# Patient Record
Sex: Male | Born: 1970 | Hispanic: Yes | Smoking: Current every day smoker
Health system: Southern US, Community
[De-identification: ages and names within clinical notes are randomized; demographics above are authoritative.]

## PROBLEM LIST (undated history)

## (undated) DIAGNOSIS — Z789 Other specified health status: Secondary | ICD-10-CM

## (undated) DIAGNOSIS — Z7289 Other problems related to lifestyle: Secondary | ICD-10-CM

## (undated) DIAGNOSIS — F1010 Alcohol abuse, uncomplicated: Secondary | ICD-10-CM

## (undated) DIAGNOSIS — F1090 Alcohol use, unspecified, uncomplicated: Secondary | ICD-10-CM

## (undated) HISTORY — DX: Alcohol abuse, uncomplicated: F10.10

---

## 2015-08-14 ENCOUNTER — Emergency Department (HOSPITAL_COMMUNITY)
Admission: EM | Admit: 2015-08-14 | Discharge: 2015-08-14 | Disposition: A | Discharge status: Home / Self Care | Payer: Self-pay | Attending: Emergency Medicine | Admitting: Emergency Medicine

## 2015-08-14 ENCOUNTER — Emergency Department (HOSPITAL_COMMUNITY): Payer: Self-pay

## 2015-08-14 ENCOUNTER — Encounter (HOSPITAL_COMMUNITY): Payer: Self-pay | Admitting: *Deleted

## 2015-08-14 DIAGNOSIS — M25511 Pain in right shoulder: Secondary | ICD-10-CM | POA: Insufficient documentation

## 2015-08-14 DIAGNOSIS — R112 Nausea with vomiting, unspecified: Secondary | ICD-10-CM | POA: Insufficient documentation

## 2015-08-14 DIAGNOSIS — M25512 Pain in left shoulder: Secondary | ICD-10-CM | POA: Insufficient documentation

## 2015-08-14 DIAGNOSIS — R109 Unspecified abdominal pain: Secondary | ICD-10-CM | POA: Insufficient documentation

## 2015-08-14 HISTORY — DX: Other specified health status: Z78.9

## 2015-08-14 HISTORY — DX: Alcohol use, unspecified, uncomplicated: F10.90

## 2015-08-14 HISTORY — DX: Other problems related to lifestyle: Z72.89

## 2015-08-14 LAB — URINALYSIS, ROUTINE W REFLEX MICROSCOPIC
BILIRUBIN URINE: NEGATIVE
GLUCOSE, UA: NEGATIVE mg/dL
HGB URINE DIPSTICK: NEGATIVE
Leukocytes, UA: NEGATIVE
Nitrite: NEGATIVE
PH: 5.5 (ref 5.0–8.0)
Protein, ur: NEGATIVE mg/dL
SPECIFIC GRAVITY, URINE: 1.025 (ref 1.005–1.030)

## 2015-08-14 LAB — COMPREHENSIVE METABOLIC PANEL
ALK PHOS: 89 U/L (ref 38–126)
ALT: 54 U/L (ref 17–63)
AST: 84 U/L — AB (ref 15–41)
Albumin: 4.5 g/dL (ref 3.5–5.0)
Anion gap: 9 (ref 5–15)
BUN: 6 mg/dL (ref 6–20)
CALCIUM: 8.8 mg/dL — AB (ref 8.9–10.3)
CO2: 23 mmol/L (ref 22–32)
CREATININE: 0.5 mg/dL — AB (ref 0.61–1.24)
Chloride: 103 mmol/L (ref 101–111)
Glucose, Bld: 99 mg/dL (ref 65–99)
Potassium: 3.5 mmol/L (ref 3.5–5.1)
Sodium: 135 mmol/L (ref 135–145)
Total Bilirubin: 0.8 mg/dL (ref 0.3–1.2)
Total Protein: 8.5 g/dL — ABNORMAL HIGH (ref 6.5–8.1)

## 2015-08-14 LAB — CBC
HCT: 40 % (ref 39.0–52.0)
HEMOGLOBIN: 13.7 g/dL (ref 13.0–17.0)
MCH: 31 pg (ref 26.0–34.0)
MCHC: 34.3 g/dL (ref 30.0–36.0)
MCV: 90.5 fL (ref 78.0–100.0)
Platelets: 145 10*3/uL — ABNORMAL LOW (ref 150–400)
RBC: 4.42 MIL/uL (ref 4.22–5.81)
RDW: 13.3 % (ref 11.5–15.5)
WBC: 5.9 10*3/uL (ref 4.0–10.5)

## 2015-08-14 LAB — TROPONIN I: Troponin I: <0.03 ng/mL (ref <0.03)

## 2015-08-14 LAB — POC OCCULT BLOOD, ED: Fecal Occult Bld: NEGATIVE

## 2015-08-14 LAB — LIPASE, BLOOD: Lipase: 40 U/L (ref 11–51)

## 2015-08-14 IMAGING — DX DG ABDOMEN ACUTE W/ 1V CHEST
4 series · 4 of 4 positions shown · non-contrast
Comparison: None.

CLINICAL DATA: 45-year-old male with nausea vomiting abdominal pain

EXAM:
DG ABDOMEN ACUTE W/ 1V CHEST

[chest pa]
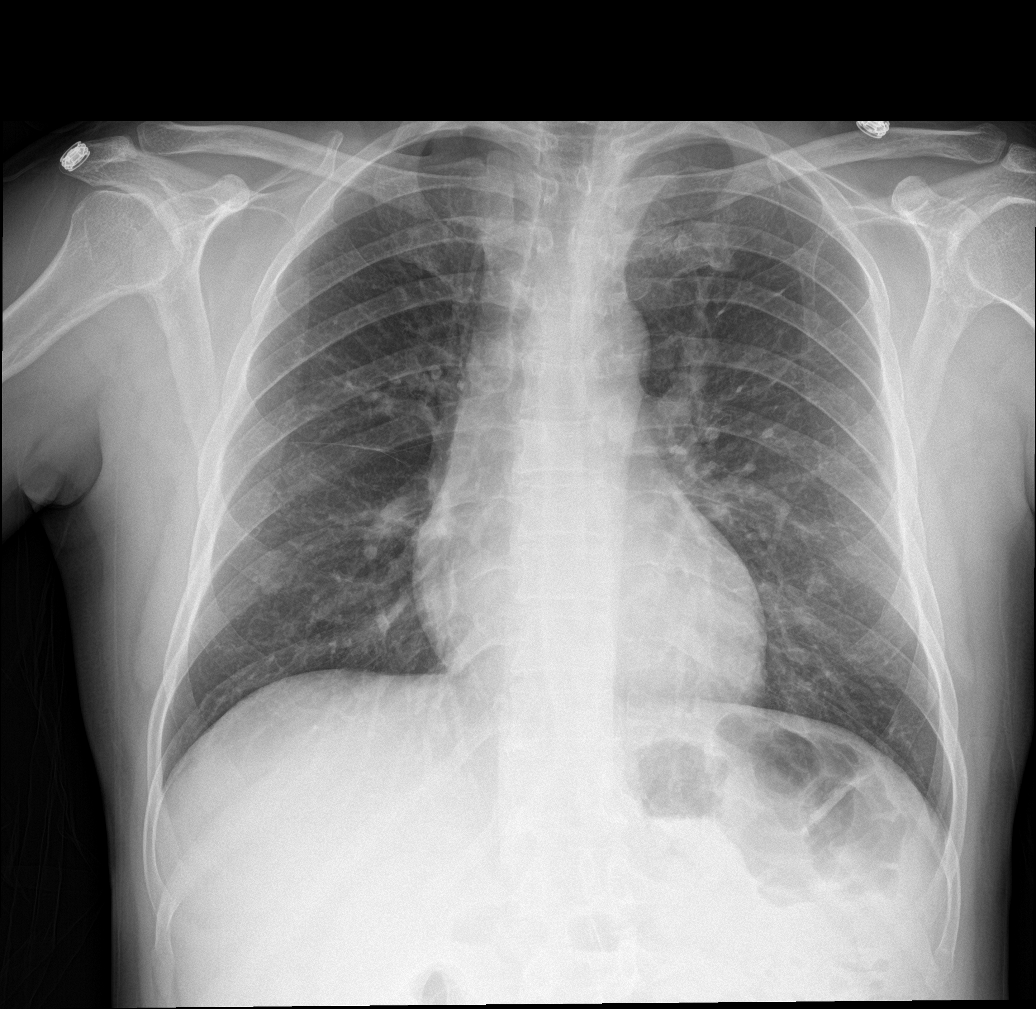

[abdomen erect]
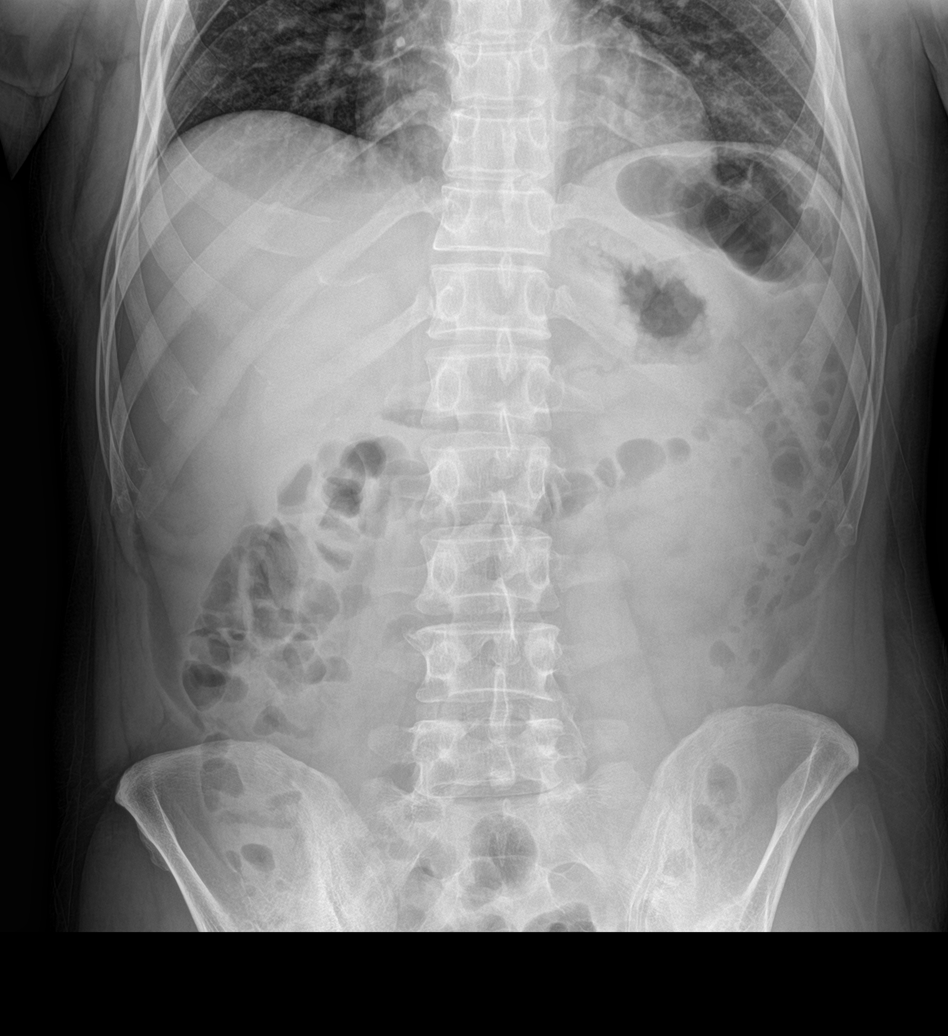

[abdomen supine (1 of 2)]
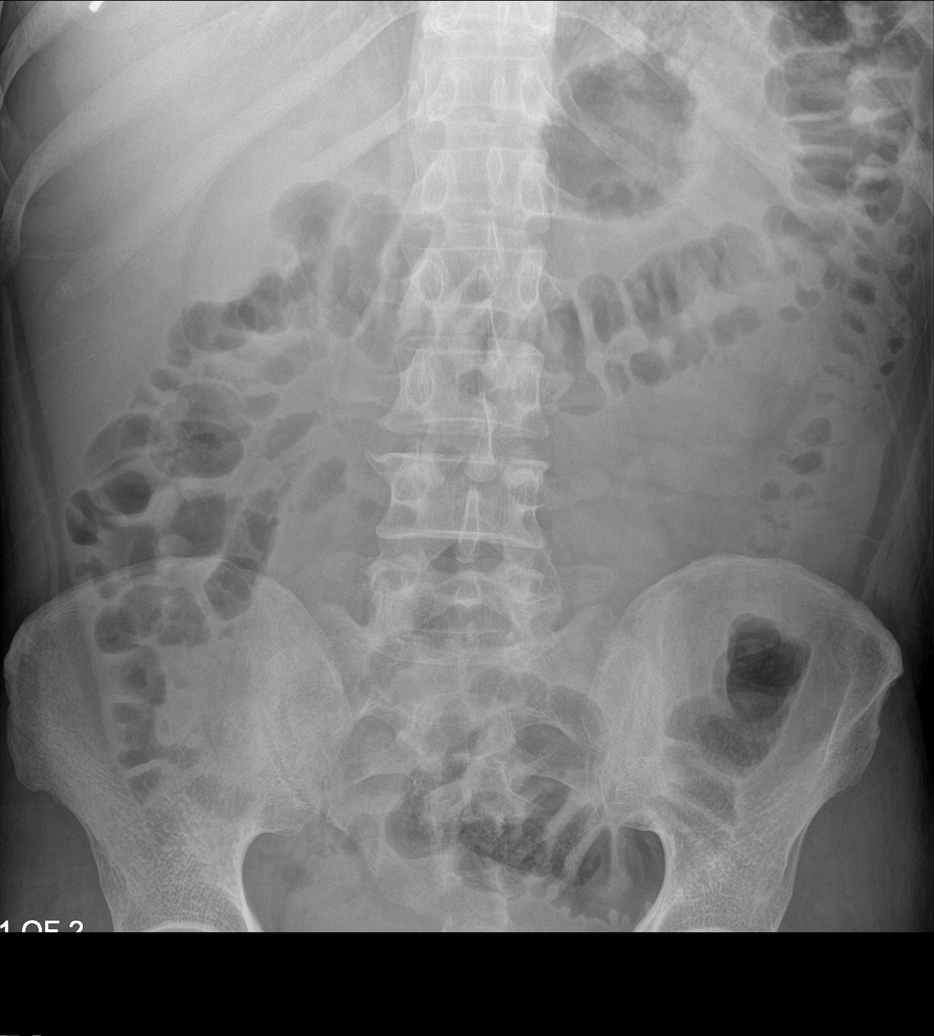

[abdomen supine (2 of 2)]
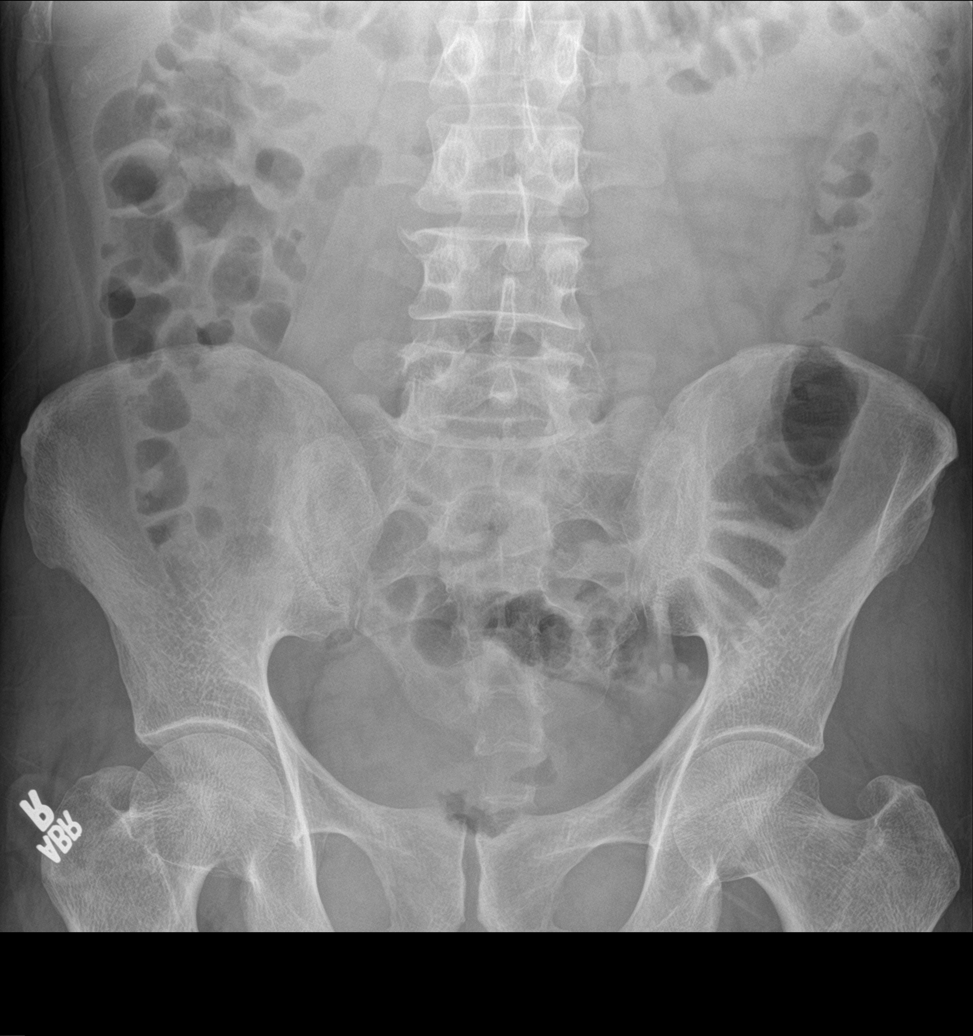

[4 of 4 positions shown; findings below may reference images not displayed]

FINDINGS: The lungs are clear. There is no pleural effusion or pneumothorax.
The cardiac silhouette is within normal limits.

There is no bowel obstruction or free air. No radiopaque calculi or
foreign object. The soft tissues and osseous structures appear
unremarkable.
IMPRESSION: Negative abdominal radiographs.  No acute cardiopulmonary disease.

## 2015-08-14 IMAGING — CT CT CERVICAL SPINE W/O CM
3 of 4 series · 13 of 33 positions shown, 16 images · non-contrast
Comparison: None.

CLINICAL DATA: Bilateral shoulder pain beginning 3 days ago.
Decreased appetite and vomiting beginning yesterday.

EXAM:
CT CERVICAL SPINE WITHOUT CONTRAST
TECHNIQUE: Multidetector CT imaging of the cervical spine was performed without
intravenous contrast. Multiplanar CT image reconstructions were also
generated.

[Series 4: sagittal bone · sagittal · 0.24mm/px · 5 of 76 slices shown, 6 images]
[im 26/76  bone]
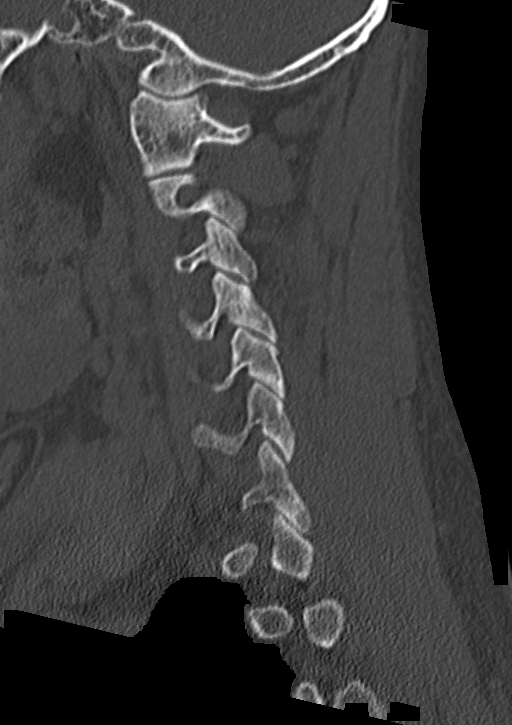
[im 32/76  bone]
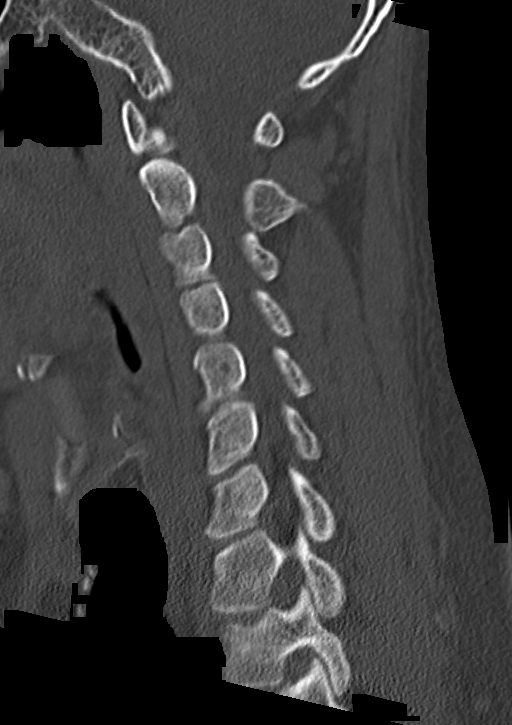
[im 38/76  soft-tissue]
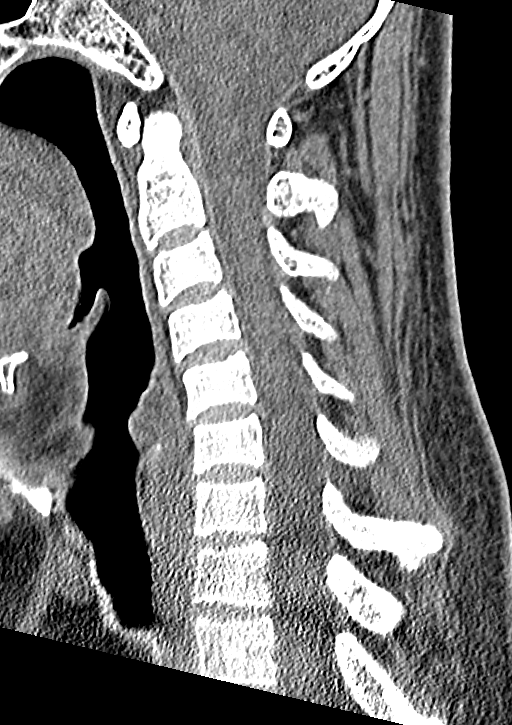
[im 38/76  bone]
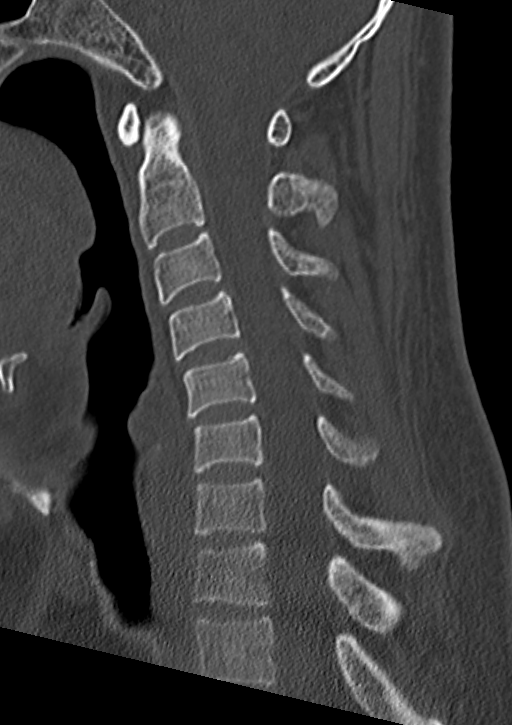
[im 44/76  bone]
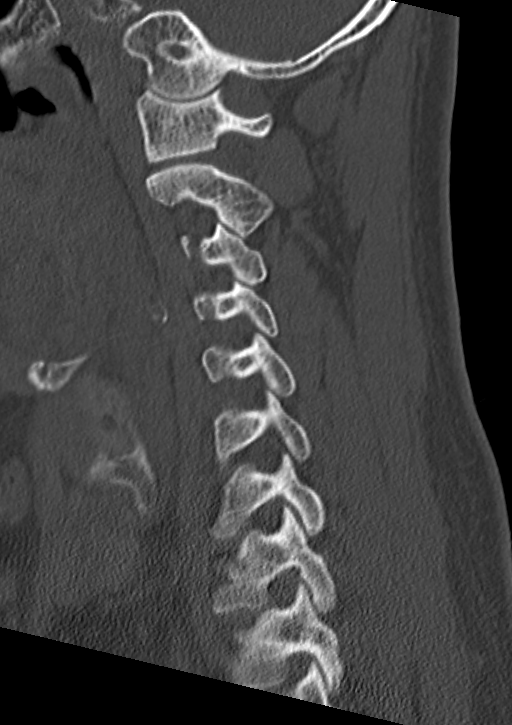
[im 51/76  bone]
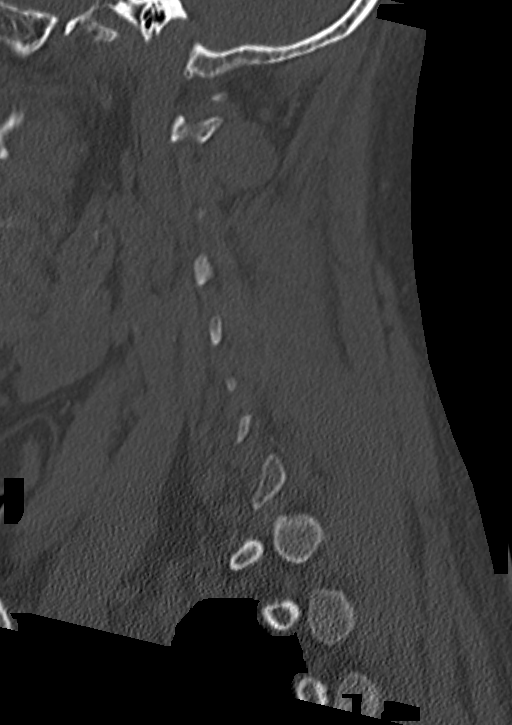

[Series 5: coronal bone · coronal · 0.27mm/px · 3 of 64 slices shown]
[im 13/64  bone]
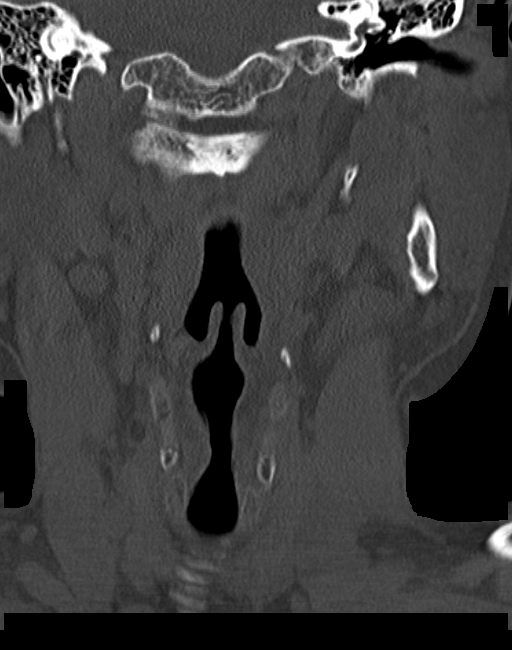
[im 26/64  bone]
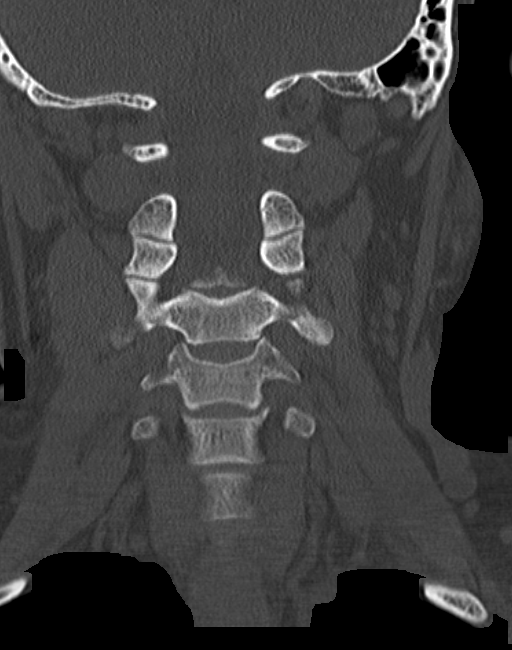
[im 38/64  bone]
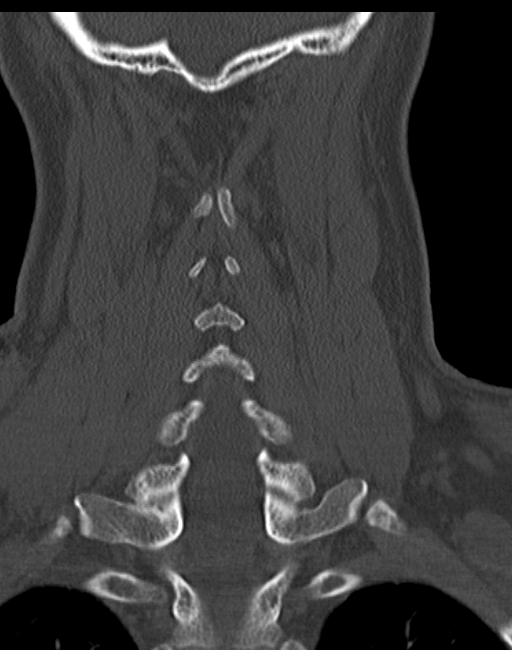

[Series 6: orthogonal axial · axial · 0.23mm/px · z∈[+931,+1039]mm · 5 of 83 slices shown, 7 images]
[im 14/83  soft-tissue]
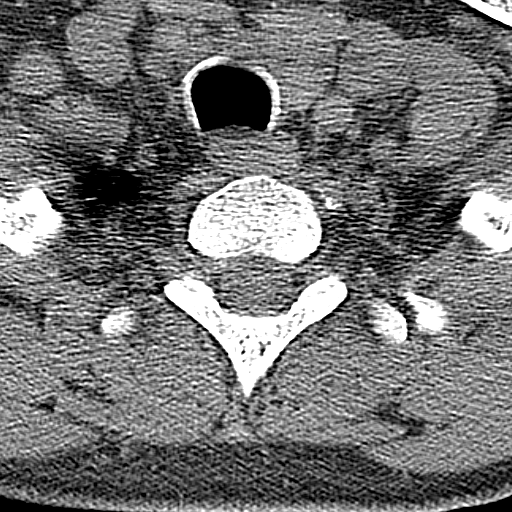
[im 14/83  bone]
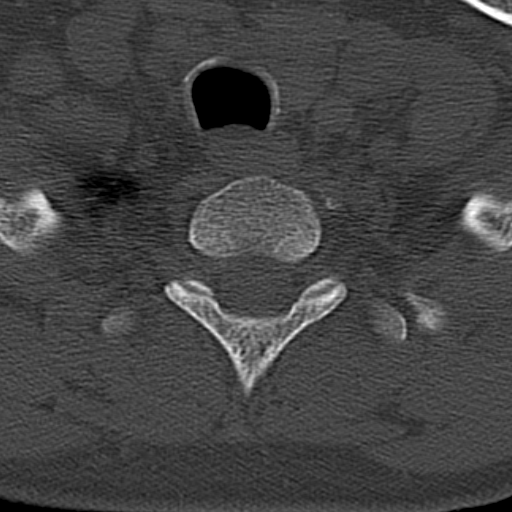
[im 28/83  bone]
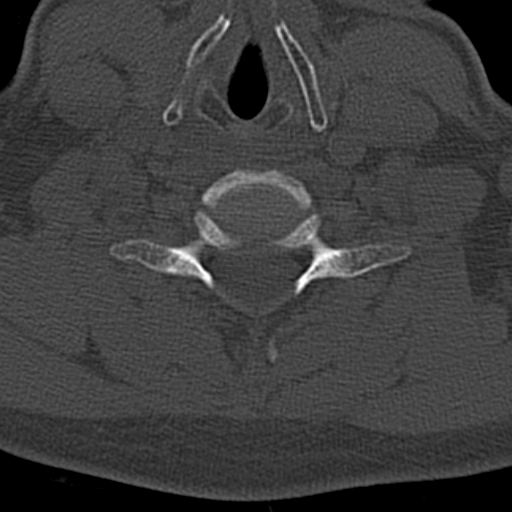
[im 42/83  bone]
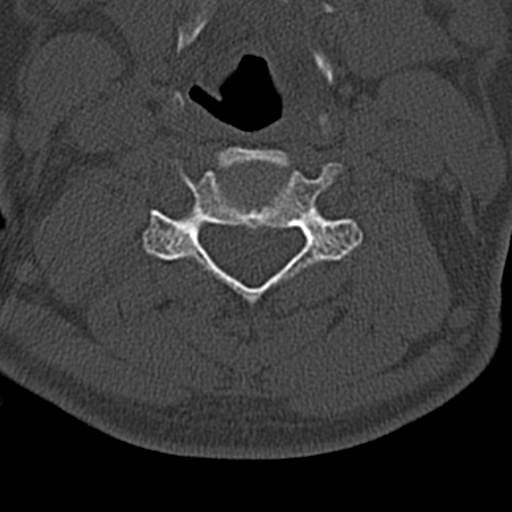
[im 55/83  bone]
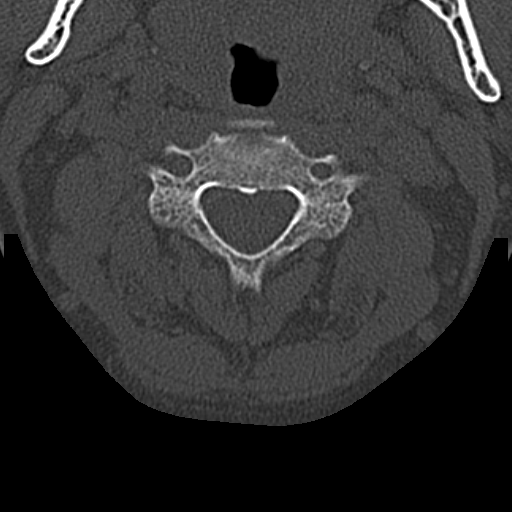
[im 69/83  soft-tissue]
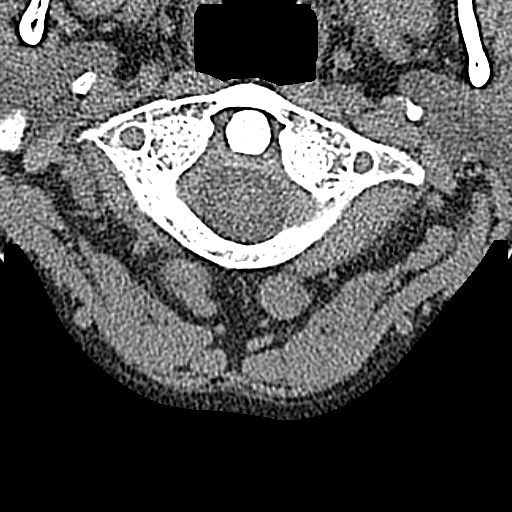
[im 69/83  bone]
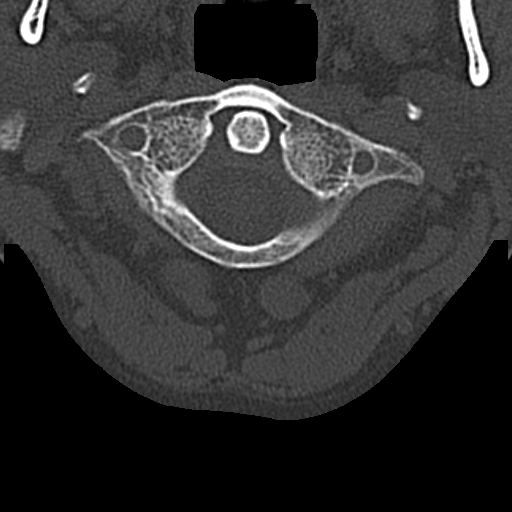

[13 of 33 positions shown; findings below may reference images not displayed]

FINDINGS: The cervical spine is imaged from the skullbase through T1-2.
Vertebral body heights and alignment are maintained. There is
straightening and some reversal of the normal cervical lordosis.

No acute fracture or traumatic subluxation is present. Minimal
uncovertebral spurring is present on the left at C4-5 without
significant stenosis.

The soft tissues of the neck are unremarkable. The lung apices are
clear.
IMPRESSION: 1. No acute fracture or traumatic subluxation.
2. Straightening of the normal cervical lordosis. This is
nonspecific, but most commonly seen in the setting of ongoing pain
or muscle strain.
3. Minimal degenerative changes in the cervical spine.

## 2015-08-14 MED ORDER — GI COCKTAIL ~~LOC~~
30.0000 mL | Freq: Once | ORAL | Status: DC
  Filled 2015-08-14: qty 30

## 2015-08-14 MED ORDER — PANTOPRAZOLE SODIUM 20 MG PO TBEC: 20.0000 mg | DELAYED_RELEASE_TABLET | Freq: Two times a day (BID) | ORAL | 0 refills | Status: DC

## 2015-08-14 MED ORDER — PANTOPRAZOLE SODIUM 40 MG IV SOLR
40.0000 mg | Freq: Once | INTRAVENOUS | Status: AC
  Administered 2015-08-14: 40 mg via INTRAVENOUS
  Filled 2015-08-14: qty 40

## 2015-08-14 MED ORDER — FAMOTIDINE IN NACL 20-0.9 MG/50ML-% IV SOLN
20.0000 mg | Freq: Once | INTRAVENOUS | Status: AC
  Administered 2015-08-14: 20 mg via INTRAVENOUS
  Filled 2015-08-14: qty 50

## 2015-08-14 MED ORDER — MORPHINE SULFATE (PF) 2 MG/ML IV SOLN: 2.0000 mg | INTRAVENOUS | Status: DC | PRN

## 2015-08-14 MED ORDER — ONDANSETRON 8 MG PO TBDP
8.0000 mg | ORAL_TABLET | Freq: Once | ORAL | Status: DC
  Filled 2015-08-14: qty 1

## 2015-08-14 MED ORDER — FAMOTIDINE 20 MG PO TABS
40.0000 mg | ORAL_TABLET | Freq: Once | ORAL | Status: DC
  Filled 2015-08-14: qty 2

## 2015-08-14 MED ORDER — ONDANSETRON HCL 4 MG/2ML IJ SOLN: 4.0000 mg | INTRAMUSCULAR | Status: DC | PRN

## 2015-08-14 NOTE — ED Provider Notes (Signed)
[facility]-EM[facility]ENCY DEPT Provider Note   CSN: [id number] Arrival date & time: 08/14/15  1721  First Provider Contact:  None       History   Chief Complaint Chief Complaint  Patient presents with  . Back Pain  . Emesis    HPI Samuel [name] is a 45 y.o. male.  HPI  Pt was seen at 1930.  Per pt, c/o gradual onset and persistence of multiple intermittent episodes of N/V that began 3 days ago. Describes the vomit has "having some flecks of blood in it." Has been associated with bilat shoulders "pain," a "dull" pain in his mid-chest, and decreased appetite. States the shoulder pain and CP began before vomiting. Endorses daily etoh use. Denies abd pain, no diarrhea, no palpitations, no cough/SOB, no back pain, no fevers, no black or blood in stools.    Past Medical History:  Diagnosis Date  . Alcohol use (HCC)     There are no active problems to display for this patient.   History reviewed. No pertinent surgical history.     Home Medications    Prior to Admission medications   Not on File    Family History   Social History Social History  Substance Use Topics  . Smoking status: Never Smoker  . Smokeless tobacco: Never Used  . Alcohol use 3.6 oz/week    6 Cans of beer per week     Comment: daily     Allergies   Review of patient's allergies indicates no known allergies.   Review of Systems Review of Systems ROS: Statement: All systems negative except as marked or noted in the HPI; Constitutional: Negative for fever and chills. ; ; Eyes: Negative for eye pain, redness and discharge. ; ; ENMT: Negative for ear pain, hoarseness, nasal congestion, sinus pressure and sore throat. ; ; Cardiovascular: Negative for chest pain, palpitations, diaphoresis, dyspnea and peripheral edema. ; ; Respiratory: Negative for cough, wheezing and stridor. ; ; Gastrointestinal: +N/V, "blood in vomit." Negative for diarrhea, abdominal pain, blood in stool, jaundice and rectal bleeding. .  ; ; Genitourinary: Negative for dysuria, flank pain and hematuria. ; ; Musculoskeletal: +upper back/neck pain.  Negative for swelling and trauma.; ; Skin: Negative for pruritus, rash, abrasions, blisters, bruising and skin lesion.; ; Neuro: Negative for headache, lightheadedness and neck stiffness. Negative for weakness, altered level of consciousness, altered mental status, extremity weakness, paresthesias, involuntary movement, seizure and syncope.      Physical Exam Updated Vital Signs BP 164/93 (BP Location: Left Arm)   Pulse 84   Temp 98.7 F (37.1 C) (Oral)   Resp 18   Ht  (1.575 m)   Wt 136 lb 3.2 oz (61.8 kg)   SpO2 97%   BMI 24.91 kg/m   Physical Exam 1935: Physical examination:  Nursing notes reviewed; Vital signs and O2 SAT reviewed;  Constitutional: Well developed, Well nourished, Well hydrated, In no acute distress; Head:  Normocephalic, atraumatic; Eyes: EOMI, PERRL, No scleral icterus; ENMT: Mouth and pharynx normal, Mucous membranes moist; Neck: Supple, Full range of motion, No lymphadenopathy; Cardiovascular: Regular rate and rhythm, No murmur, rub, or gallop; Respiratory: Breath sounds clear & equal bilaterally, No rales, rhonchi, wheezes.  Speaking full sentences with ease, Normal respiratory effort/excursion; Chest: Nontender, Movement normal; Abdomen: Soft, Nontender, Nondistended, Normal bowel sounds. Rectal exam performed w/permission of pt and ED RN chaperone present.  Anal tone normal.  Non-tender, soft brown stool in rectal vault, heme neg.  No fissures, no external hemorrhoids,  no palp masses.;;; Genitourinary: No CVA tenderness; Spine:  No midline CS, TS, LS tenderness. +TTP bilat trapezius muscles and bilat lower cervical paraspinal muscles.;; Extremities: Pulses normal, No tenderness, No edema, No calf edema or asymmetry.; Neuro: AA&Ox3, Major CN grossly intact.  Speech clear. No gross focal motor or sensory deficits in extremities.; Skin: Color normal, Warm,  Dry.   ED Treatments / Results  [name] (all [name] ordered are listed, but only abnormal results are displayed)   EKG  EKG Interpretation  Date/Time:  Friday August 14 2015 17:43:34 EDT Ventricular Rate:  94 PR Interval:  158 QRS Duration: 84 QT Interval:  366 QTC Calculation: 457 R Axis:   51 Text Interpretation:  Normal sinus rhythm Moderate voltage criteria for LVH, may be normal variant No old tracing to compare Confirmed by [facility]  MD, Nicholos Johns [phone]) on 08/14/2015 7:55:40 PM       Radiology No results found.  Procedures Procedures (including critical care time)  Medications Ordered in ED Medications  ondansetron (ZOFRAN) injection 4 mg (not administered)  famotidine (PEPCID) IVPB 20 mg premix (not administered)  pantoprazole (PROTONIX) injection 40 mg (40 mg Intravenous Given 08/14/15 2037)     Initial Impression / Assessment and Plan / ED Course  I have reviewed the triage vital signs and the nursing notes.  Pertinent [name] & imaging results that were available during my care of the patient were reviewed by me and considered in my medical decision making (see chart for details).  MDM Reviewed: previous chart, nursing note and vitals Reviewed previous: [name] Interpretation: [name]   Results for orders placed or performed during the hospital encounter of 08/14/15  Lipase, blood  Result Value Ref Range   Lipase 40 11 - 51 U/L  Comprehensive metabolic panel  Result Value Ref Range   Sodium 135 135 - 145 mmol/L   Potassium 3.5 3.5 - 5.1 mmol/L   Chloride 103 101 - 111 mmol/L   CO2 23 22 - 32 mmol/L   Glucose, Bld 99 65 - 99 mg/dL   BUN 6 6 - 20 mg/dL   Creatinine, Ser 6.04 (L) 0.61 - 1.24 mg/dL   Calcium 8.8 (L) 8.9 - 10.3 mg/dL   Total Protein 8.5 (H) 6.5 - 8.1 g/dL   Albumin 4.5 3.5 - 5.0 g/dL   AST 84 (H) 15 - 41 U/L   ALT 54 17 - 63 U/L   Alkaline Phosphatase 89 38 - 126 U/L   Total Bilirubin 0.8 0.3 - 1.2 mg/dL   GFR calc non Af Amer >60 >60 mL/min    GFR calc Af Amer >60 >60 mL/min   Anion gap 9 5 - 15  CBC  Result Value Ref Range   WBC 5.9 4.0 - 10.5 K/uL   RBC 4.42 4.22 - 5.81 MIL/uL   Hemoglobin 13.7 13.0 - 17.0 g/dL   HCT 54.0 98.1 - 19.1 %   MCV 90.5 78.0 - 100.0 fL   MCH 31.0 26.0 - 34.0 pg   MCHC 34.3 30.0 - 36.0 g/dL   RDW 47.8 29.5 - 62.1 %   Platelets 145 (L) 150 - 400 K/uL  Urinalysis, Routine w reflex microscopic  Result Value Ref Range   Color, Urine YELLOW YELLOW   APPearance CLEAR CLEAR   Specific Gravity, Urine 1.025 1.005 - 1.030   pH 5.5 5.0 - 8.0   Glucose, UA NEGATIVE NEGATIVE mg/dL   Hgb urine dipstick NEGATIVE NEGATIVE   Bilirubin Urine NEGATIVE NEGATIVE   Ketones, ur  TRACE (A) NEGATIVE mg/dL   Protein, ur NEGATIVE NEGATIVE mg/dL   Nitrite NEGATIVE NEGATIVE   Leukocytes, UA NEGATIVE NEGATIVE  Troponin I  Result Value Ref Range   Troponin I <0.03 <0.03 ng/mL  POC occult blood, ED  Result Value Ref Range   Fecal Occult Bld NEGATIVE NEGATIVE    2125:  Hx daily etoh use. IV pepcid, protonix and zofran given in ED. Stool heme negative. Abd benign, VSS. Doubt PE as cause for CP/back pain symptoms with low risk Wells.  Doubt ACS as cause for symptoms with normal troponin after 2 to 3 days of constant symptoms. CT CS and AXR pending. Will need PO challenge. Sign out to Dr. Juleen China.     Final Clinical Impressions(s) / ED Diagnoses   Final diagnoses:  None    New Prescriptions New Prescriptions   No medications on file     Samuel Jester, DO 08/14/15 2128

## 2015-08-14 NOTE — ED Triage Notes (Signed)
Pt comes in with bilateral shoulder pain that started 3 days ago. Pt states he has had decreased appetite and began vomiting yesterday. Pt states he has a dull pain in his chest.

## 2015-10-08 ENCOUNTER — Emergency Department
Admission: EM | Admit: 2015-10-08 | Discharge: 2015-10-09 | Disposition: A | Discharge status: Home / Self Care | Payer: Self-pay | Attending: Emergency Medicine | Admitting: Emergency Medicine

## 2015-10-08 ENCOUNTER — Encounter: Payer: Self-pay | Admitting: Emergency Medicine

## 2015-10-08 DIAGNOSIS — F10230 Alcohol dependence with withdrawal, uncomplicated: Secondary | ICD-10-CM | POA: Insufficient documentation

## 2015-10-08 DIAGNOSIS — F10220 Alcohol dependence with intoxication, uncomplicated: Secondary | ICD-10-CM

## 2015-10-08 LAB — CBC WITH DIFFERENTIAL/PLATELET
BASOS PCT: 1 %
Basophils Absolute: 0 10*3/uL (ref 0–0.1)
EOS ABS: 0 10*3/uL (ref 0–0.7)
EOS PCT: 0 %
HCT: 41 % (ref 40.0–52.0)
Hemoglobin: 14.1 g/dL (ref 13.0–18.0)
LYMPHS ABS: 0.4 10*3/uL — AB (ref 1.0–3.6)
Lymphocytes Relative: 9 %
MCH: 31.9 pg (ref 26.0–34.0)
MCHC: 34.4 g/dL (ref 32.0–36.0)
MCV: 92.8 fL (ref 80.0–100.0)
MONO ABS: 0.4 10*3/uL (ref 0.2–1.0)
MONOS PCT: 9 %
NEUTROS PCT: 81 %
Neutro Abs: 3.9 10*3/uL (ref 1.4–6.5)
PLATELETS: 99 10*3/uL — AB (ref 150–440)
RBC: 4.42 MIL/uL (ref 4.40–5.90)
RDW: 15 % — AB (ref 11.5–14.5)
WBC: 4.9 10*3/uL (ref 3.8–10.6)

## 2015-10-08 LAB — COMPREHENSIVE METABOLIC PANEL
ALBUMIN: 4.5 g/dL (ref 3.5–5.0)
ALT: 102 U/L — ABNORMAL HIGH (ref 17–63)
ANION GAP: 12 (ref 5–15)
AST: 182 U/L — ABNORMAL HIGH (ref 15–41)
Alkaline Phosphatase: 93 U/L (ref 38–126)
BUN: <5 mg/dL — ABNORMAL LOW (ref 6–20)
CALCIUM: 8.8 mg/dL — AB (ref 8.9–10.3)
CO2: 24 mmol/L (ref 22–32)
Chloride: 100 mmol/L — ABNORMAL LOW (ref 101–111)
Creatinine, Ser: 0.5 mg/dL — ABNORMAL LOW (ref 0.61–1.24)
GFR calc non Af Amer: >60 mL/min (ref >60)
GLUCOSE: 106 mg/dL — AB (ref 65–99)
POTASSIUM: 3.1 mmol/L — AB (ref 3.5–5.1)
SODIUM: 136 mmol/L (ref 135–145)
TOTAL PROTEIN: 8.5 g/dL — AB (ref 6.5–8.1)
Total Bilirubin: 0.4 mg/dL (ref 0.3–1.2)

## 2015-10-08 LAB — ETHANOL
ALCOHOL ETHYL (B): 310 mg/dL — AB (ref <5)
ALCOHOL ETHYL (B): 206 mg/dL — AB (ref <5)

## 2015-10-08 MED ORDER — LORAZEPAM 2 MG PO TABS
2.0000 mg | ORAL_TABLET | Freq: Once | ORAL | Status: DC
  Filled 2015-10-08: qty 1

## 2015-10-08 MED ORDER — LORAZEPAM 2 MG PO TABS
2.0000 mg | ORAL_TABLET | Freq: Once | ORAL | Status: AC
  Administered 2015-10-08: 2 mg via ORAL
  Filled 2015-10-08: qty 1

## 2015-10-08 NOTE — ED Notes (Signed)
Patient here for alcohol withdrawal. Denies HI or SI. Patient calm and cooperative at this time.

## 2015-10-08 NOTE — Progress Notes (Signed)
[place] [facility] [place] Spoke with RN, states Hispanic Speaking services are available, and that should call in a.m. And once pt. BAC is 200 or lower. Contact person is Office managerMelissa [Hispanic speaking[facility] [place] [phone] for intake phone interview for detox and residential tx. Facility has bed availability mostly daily for detox and some d/c in a.m. Are expected.  If possible also may ask for Danny Sprinkles lead clinician if needed and state [facility].  Patient had medical interpreter present during assessment states he is o.k. With tx. And placement.  Marycruz Boehner K. Sherlon HandingHarris, LCAS-A, LPC-A, [facility]  Counselor 10/08/2015 6:28 PM

## 2015-10-08 NOTE — ED Provider Notes (Signed)
Time Seen: Approximately 1533  I have reviewed the triage notes  Chief Complaint: Drug / Alcohol Assessment   History of Present Illness: [name] is a 45 y.o. male who arrives here voluntarily with request for alcohol treatment. Patient is exclusively Spanish-speaking and his history, review of systems, past medical history and disposition and discharge were performed through [place] interpreter. The patient states he drinks approximately a 12 pack a day for the last 7 years if he has the financial capability. He states when he doesn't have money drinks at least 4 beers a day. He has noticed if he does not drink on a daily basis he gets a tremor and movement because he had a tremor at work. Patient denies any illicit drugs. Denies any suicidal thoughts, homicidal thoughts, or hallucinations. States he drank "" a few beers earlier today "". He states he stopped drinking approximately noon. He was dropped off here by another individual for assessment.   Past Medical History:  Diagnosis Date  . Alcohol use (HCC)     There are no active problems to display for this patient.   History reviewed. No pertinent surgical history.  History reviewed. No pertinent surgical history.  Current Outpatient Rx  . Order #: [id number] Class: Print    Allergies:  Review of patient's allergies indicates no known allergies.  Family History: History reviewed. No pertinent family history.  Social History: Social History  Substance Use Topics  . Smoking status: Never Smoker  . Smokeless tobacco: Never Used  . Alcohol use 7.2 oz/week    12 Cans of beer per week     Comment: Pt drinks 1 pack/ day     Review of Systems:   10 point review of systems was performed and was otherwise negative:  Constitutional: No fever. He describes a decreased appetite and states he does not eat or drink other beverages other than alcohol Eyes: No visual disturbances ENT: No sore throat, ear pain Cardiac:  No chest pain Respiratory: No shortness of breath, wheezing, or stridor Abdomen: No abdominal pain, no vomiting, No diarrhea Endocrine: No weight loss, No night sweats Extremities: No peripheral edema, cyanosis Skin: No rashes, easy bruising Neurologic: No focal weakness, trouble with speech or swollowing Urologic: No dysuria, Hematuria, or urinary frequency   Physical Exam:  ED Triage Vitals  Enc Vitals Group     BP 10/08/15 1438 (!) 144/94     Pulse Rate 10/08/15 1438 (!) 102     Resp 10/08/15 1438 20     Temp 10/08/15 1438 98.2 F (36.8 C)     Temp Source 10/08/15 1438 Oral     SpO2 10/08/15 1438 97 %     Weight 10/08/15 1439 145 lb (65.8 kg)     Height 10/08/15 1439 5\' 4"  (1.626 m)     Head Circumference --      Peak Flow --      Pain Score --      Pain Loc --      Pain Edu? --      Excl. in GC? --     General: Awake , Alert , and Oriented times 3; GCS 15 Spells of alcohol but able to answer most questions appropriately. Head: Normal cephalic , atraumatic Eyes: Pupils equal , round, reactive to light Nose/Throat: No nasal drainage, patent upper airway without erythema or exudate.  Neck: Supple, Full range of motion, No anterior adenopathy or palpable thyroid masses Lungs: Clear to ascultation without wheezes , rhonchi, or  rales Heart: Regular rate, regular rhythm without murmurs , gallops , or rubs Abdomen: Soft, non tender without rebound, guarding , or rigidity; bowel sounds positive and symmetric in all 4 quadrants. No organomegaly .        Extremities: 2 plus symmetric pulses. No edema, clubbing or cyanosis Neurologic: normal ambulation, Motor symmetric without deficits, sensory intact Skin: warm, dry, no rashes   Labs:   All laboratory work was reviewed including any pertinent negatives or positives listed below:  Labs Reviewed  CBC WITH DIFFERENTIAL/PLATELET - Abnormal; Notable for the following:       Result Value   RDW 15.0 (*)    Platelets 99 (*)     Lymphs Abs 0.4 (*)    All other components within normal limits  COMPREHENSIVE METABOLIC PANEL - Abnormal; Notable for the following:    Potassium 3.1 (*)    Chloride 100 (*)    Glucose, Bld 106 (*)    BUN <5 (*)    Creatinine, Ser 0.50 (*)    Calcium 8.8 (*)    Total Protein 8.5 (*)    AST 182 (*)    ALT 102 (*)    All other components within normal limits  ETHANOL - Abnormal; Notable for the following:    Alcohol, Ethyl (B) 310 (*)    All other components within normal limits     ED Course:  Patient will be observed here and seen by TTS services. I felt he did not require psychiatric evaluation at this time. Patient's never sought alcohol treatment in the past. He does not appear to have any signs of suicidal thoughts or significant depression and is requesting alcohol treatment. Patient will likely be observed here until he reaches the point of sobriety. Clinical Course     Assessment:  Chronic alcohol abuse*      Plan:  Outpatient Patient was advised to return immediately if condition worsens. Patient was advised to follow up with their primary care physician or other specialized physicians involved in their outpatient care. The patient and/or family member/power of attorney had laboratory results reviewed at the bedside. All questions and concerns were addressed and appropriate discharge instructions were distributed by the nursing staff.             Jennye Moccasin, MD 10/08/15 [phone]

## 2015-10-08 NOTE — BH Assessment (Signed)
Tele Assessment Note   Samuel [name] is an 45 y.o. male, Hispanic, Single who presents to [facility] per ED report: voluntarily with request for alcohol treatment. Patient is exclusively Spanish-speaking and his history, review of systems, past medical history and disposition and discharge were performed through [place] interpreter. The patient states he drinks approximately a 12 pack a day for the last 7 years if he has the financial capability. He states when he doesn't have money drinks at least 4 beers a day. He has noticed if he does not drink on a daily basis he gets a tremor and movement because he had a tremor at work. Patient denies any illicit drugs. Denies any suicidal thoughts, homicidal thoughts, or hallucinations. States he drank "" a few beers earlier today "". He states he stopped drinking approximately noon. He was dropped off here by another individual for assessment.  Interpreter was used for assessment. Patient states his primary concern is of severe alcohol use. Patient states he has poor nutrition and has barely ate anything due to drinking, and reports for years only sleeping from 2-3 hours nightly. Patient denies history of psychotic symptoms of mental health diagnosis, and has no primary care provider. Patient states he currently resides with friends, and Dorene Sorrow May, a friend transported him to ER to seek help.  Patient denies current or past hx. Of SI and HI, as well as AVH. However, patient reports visual disturbances with blurred vision etc. During and after drinking, and reports does not need/ wear glasses. Patient acknowledges S.A> with alcohol x 7 years or more of use with 12 pack or more daily as well as an eye opener. Last drink was today with reports of unknown quantities. Patient denies past hx. Of S.A. Inpatient or psych inpatient tx. As well as outpatient. Patient is dressed in normal attire with upheaval appearance, and is only slightly oriented and alert. Patient speech was  within normal limits [although soft], and motor behavior appeared normal. Patient thought process is coherent and relevant yet at times hard to follow.  Patient does not appear to be responding to internal stimuli. Patient was cooperative throughout the assessment and states that he is agreeable to inpatient psychiatric treatment or residential S.A/ detox treatment with Hispanic interpretation, or Spanish speaker available.   Diagnosis: Alcohol Use Disorder, Severe  Past Medical History:  Past Medical History:  Diagnosis Date  . Alcohol use (HCC)     History reviewed. No pertinent surgical history.  Family History: History reviewed. No pertinent family history.  Social History:  reports that he has never smoked. He has never used smokeless tobacco. He reports that he drinks about 7.2 oz of alcohol per week . He reports that he does not use drugs.  Additional Social History:  Alcohol / Drug Use Pain Medications: SEE MAR Prescriptions: SEE MAR Over the Counter: SEE MAR History of alcohol / drug use?: Yes Longest period of sobriety (when/how long): unknown Negative Consequences of Use: Financial, Legal, Personal relationships, Work / School Withdrawal Symptoms: Patient aware of relationship between substance abuse and physical/medical complications Substance #1 Name of Substance 1: alcohol 1 - Age of First Use: unknown 1 - Amount (size/oz): 12 pack or more 1 - Frequency: daily 1 - Duration: years x 7 or more 1 - Last Use / Amount: 10/08/15 unknown amount  CIWA: CIWA-Ar BP: (!) 144/94 Pulse Rate: (!) 102 Nausea and Vomiting: no nausea and no vomiting Tactile Disturbances: none Tremor: five Auditory Disturbances: not present Paroxysmal Sweats: no  sweat visible Visual Disturbances: not present Anxiety: mildly anxious Headache, Fullness in Head: none present Agitation: normal activity Orientation and Clouding of Sensorium: oriented and can do serial additions CIWA-Ar Total:  6 COWS:    PATIENT STRENGTHS: (choose at least two) Active sense of humor Average or above average intelligence Capable of independent living  Allergies: No Known Allergies  Home Medications:  (Not in a hospital admission)  OB/GYN Status:  No LMP for male patient.  General Assessment Data Location of Assessment: [facility] ED TTS Assessment: In system Is this a Tele or Face-to-Face Assessment?: Face-to-Face Is this an Initial Assessment or a Re-assessment for this encounter?: Initial Assessment Marital status: Single Maiden name: n/a Is patient pregnant?: No Pregnancy Status: No Living Arrangements: Non-relatives/Friends Can pt return to current living arrangement?: Yes Admission Status: Voluntary Is patient capable of signing voluntary admission?: Yes Referral Source: Self/Family/Friend Insurance type: Medicaid     Crisis Care Plan Living Arrangements: Non-relatives/Friends Name of Psychiatrist: none Name of Therapist: none  Education Status Is patient currently in school?: No Current Grade: n/a Highest grade of school patient has completed: unspecified Name of school: n/a Contact person: Brother Lyn Records or Dorene Sorrow May friend  Risk to self with the past 6 months Suicidal Ideation: No Has patient been a risk to self within the past 6 months prior to admission? : No Suicidal Intent: No Has patient had any suicidal intent within the past 6 months prior to admission? : No Is patient at risk for suicide?: No Suicidal Plan?: No Has patient had any suicidal plan within the past 6 months prior to admission? : No Access to Means: No What has been your use of drugs/alcohol within the last 12 months?: alcohol, severe Previous Attempts/Gestures: No How many times?: 0 Other Self Harm Risks: none noted Triggers for Past Attempts: Unpredictable Intentional Self Injurious Behavior: None Family Suicide History: No Recent stressful life event(s): Turmoil  (Comment) Persecutory voices/beliefs?: No Depression: Yes Depression Symptoms: Despondent, Insomnia, Tearfulness, Isolating, Fatigue, Guilt, Loss of interest in usual pleasures, Feeling worthless/self pity Substance abuse history and/or treatment for substance abuse?: Yes Suicide prevention information given to non-admitted patients: Not applicable  Risk to Others within the past 6 months Homicidal Ideation: No Does patient have any lifetime risk of violence toward others beyond the six months prior to admission? : No Thoughts of Harm to Others: No Current Homicidal Intent: No Current Homicidal Plan: No Access to Homicidal Means: No Identified Victim: none History of harm to others?: No Assessment of Violence: None Noted Violent Behavior Description: none noted Does patient have access to weapons?: No Criminal Charges Pending?: No Does patient have a court date: No Is patient on probation?: No  Psychosis Hallucinations: None noted Delusions: None noted  Mental Status Report Appearance/Hygiene: Disheveled Eye Contact: Poor Motor Activity: Unsteady Speech: Slow Level of Consciousness: Drowsy Mood: Depressed Affect: Depressed Anxiety Level: Moderate Thought Processes: Circumstantial Judgement: Impaired Orientation: Person, Place, Time, Situation, Appropriate for developmental age Obsessive Compulsive Thoughts/Behaviors: Minimal  Cognitive Functioning Concentration: Decreased Memory: Recent Intact, Remote Intact IQ: Average Insight: Poor Impulse Control: Poor Appetite: Poor Weight Loss: 10 Weight Gain: 0 Sleep: Decreased Total Hours of Sleep: 2 Vegetative Symptoms: None  ADLScreening [facility] Assessment Services) Patient's cognitive ability adequate to safely complete daily activities?: Yes Patient able to express need for assistance with ADLs?: Yes (pt requires translator or interpreter) Independently performs ADLs?: Yes (appropriate for developmental age)  Prior  Inpatient Therapy Prior Inpatient Therapy: No Prior Therapy Dates: n/a  Prior Therapy Facilty/Provider(s): n/a Reason for Treatment: n/a  Prior Outpatient Therapy Prior Outpatient Therapy: No Prior Therapy Dates: n/a Prior Therapy Facilty/Provider(s): n/a Reason for Treatment: n/a Does patient have an ACCT team?: No Does patient have Intensive In-House Services?  : No Does patient have [facility] services? : No Does patient have P4CC services?: No  ADL Screening (condition at time of admission) Patient's cognitive ability adequate to safely complete daily activities?: Yes Is the patient deaf or have difficulty hearing?: No Does the patient have difficulty seeing, even when wearing glasses/contacts?: Yes (pt. reports does not wear glasses, but reports visula disturbance, when drinking and after) Does the patient have difficulty concentrating, remembering, or making decisions?: No Patient able to express need for assistance with ADLs?: Yes (pt requires translator or interpreter) Does the patient have difficulty dressing or bathing?: No Independently performs ADLs?: Yes (appropriate for developmental age) Does the patient have difficulty walking or climbing stairs?: No Weakness of Legs: None Weakness of Arms/Hands: None  Home Assistive Devices/Equipment Home Assistive Devices/Equipment: None    Abuse/Neglect Assessment (Assessment to be complete while patient is alone) Physical Abuse: Denies Verbal Abuse: Denies Sexual Abuse: Denies Exploitation of patient/patient's resources: Denies Self-Neglect: Denies Values / Beliefs Cultural Requests During Hospitalization: None Spiritual Requests During Hospitalization: None   Advance Directives (For Healthcare) Does patient have an advance directive?: No Would patient like information on creating an advanced directive?: No - patient declined information    Additional Information 1:1 In Past 12 Months?: No CIRT Risk: No Elopement  Risk: No Does patient have medical clearance?: No (pending pt. on CIWA protocol)     Disposition:  Disposition Initial Assessment Completed for this Encounter: Yes Disposition of Patient: Other dispositions (TBD)  Hipolito BayleyShean K Jaycen Vercher 10/08/2015 6:08 PM

## 2015-10-08 NOTE — ED Triage Notes (Addendum)
Pt presents to the ED stating that he wants to quit drinking. Pt states when he stops drinking he begins to shake. Pt states that he "had a few beers earlier today", further clarifies at 11 or 12pm today. Pt denies SI/HI at this time. [place], Medical interpreter, to interpret at this time. Pt states he drinks a 12pack/day x 7 years.

## 2015-10-09 LAB — ETHANOL: ALCOHOL ETHYL (B): 25 mg/dL — AB (ref <5)

## 2015-10-09 MED ORDER — LORAZEPAM 2 MG PO TABS
0.0000 mg | ORAL_TABLET | Freq: Four times a day (QID) | ORAL | Status: DC
Start: 2015-10-09 — End: 2015-10-09
  Administered 2015-10-09: 2 mg via ORAL

## 2015-10-09 MED ORDER — LORAZEPAM 2 MG PO TABS: 0.0000 mg | ORAL_TABLET | Freq: Two times a day (BID) | ORAL | Status: DC

## 2015-10-09 NOTE — ED Notes (Signed)
Pt given contact information highlighted on discharge instructions for [facility]. Verbalized understanding to call before 5pm.

## 2015-10-09 NOTE — ED Notes (Signed)
Patient woke up and asked for water. Patient given fresh cup of ice water

## 2015-10-09 NOTE — ED Notes (Addendum)
Pt on phone with [facility]. Is doing screening process. [facility] reports may not have a bed ready until tomorrow, at the earliest. Pt will be discharged home and will proceed to treatment when bed is available.

## 2018-06-17 ENCOUNTER — Inpatient Hospital Stay
Admission: EM | Admit: 2018-06-17 | Discharge: 2018-06-25 | DRG: 439 | Disposition: A | Discharge status: Home / Self Care | Payer: Self-pay | Attending: Internal Medicine | Admitting: Internal Medicine

## 2018-06-17 ENCOUNTER — Other Ambulatory Visit: Payer: Self-pay

## 2018-06-17 ENCOUNTER — Emergency Department: Payer: Self-pay

## 2018-06-17 ENCOUNTER — Encounter: Payer: Self-pay | Admitting: *Deleted

## 2018-06-17 DIAGNOSIS — Z1159 Encounter for screening for other viral diseases: Secondary | ICD-10-CM

## 2018-06-17 DIAGNOSIS — F10231 Alcohol dependence with withdrawal delirium: Secondary | ICD-10-CM | POA: Diagnosis present

## 2018-06-17 DIAGNOSIS — K852 Alcohol induced acute pancreatitis without necrosis or infection: Principal | ICD-10-CM | POA: Diagnosis present

## 2018-06-17 DIAGNOSIS — D696 Thrombocytopenia, unspecified: Secondary | ICD-10-CM | POA: Diagnosis present

## 2018-06-17 DIAGNOSIS — K859 Acute pancreatitis without necrosis or infection, unspecified: Secondary | ICD-10-CM | POA: Diagnosis present

## 2018-06-17 DIAGNOSIS — E876 Hypokalemia: Secondary | ICD-10-CM | POA: Diagnosis not present

## 2018-06-17 DIAGNOSIS — Z23 Encounter for immunization: Secondary | ICD-10-CM

## 2018-06-17 DIAGNOSIS — Y908 Blood alcohol level of 240 mg/100 ml or more: Secondary | ICD-10-CM | POA: Diagnosis present

## 2018-06-17 LAB — COMPREHENSIVE METABOLIC PANEL
ALT: 169 U/L — ABNORMAL HIGH (ref 0–44)
AST: 416 U/L — ABNORMAL HIGH (ref 15–41)
Albumin: 5.1 g/dL — ABNORMAL HIGH (ref 3.5–5.0)
Alkaline Phosphatase: 112 U/L (ref 38–126)
Anion gap: 16 — ABNORMAL HIGH (ref 5–15)
BUN: 5 mg/dL — ABNORMAL LOW (ref 6–20)
CO2: 25 mmol/L (ref 22–32)
Calcium: 8.7 mg/dL — ABNORMAL LOW (ref 8.9–10.3)
Chloride: 96 mmol/L — ABNORMAL LOW (ref 98–111)
Creatinine, Ser: 0.58 mg/dL — ABNORMAL LOW (ref 0.61–1.24)
GFR calc Af Amer: >60 mL/min (ref >60)
GFR calc non Af Amer: >60 mL/min (ref >60)
Glucose, Bld: 106 mg/dL — ABNORMAL HIGH (ref 70–99)
Potassium: 4.1 mmol/L (ref 3.5–5.1)
Sodium: 137 mmol/L (ref 135–145)
Total Bilirubin: 1.1 mg/dL (ref 0.3–1.2)
Total Protein: 8.9 g/dL — ABNORMAL HIGH (ref 6.5–8.1)

## 2018-06-17 LAB — CBC
HCT: 41 % (ref 39.0–52.0)
Hemoglobin: 14.3 g/dL (ref 13.0–17.0)
MCH: 31 pg (ref 26.0–34.0)
MCHC: 34.9 g/dL (ref 30.0–36.0)
MCV: 88.7 fL (ref 80.0–100.0)
Platelets: 64 10*3/uL — ABNORMAL LOW (ref 150–400)
RBC: 4.62 MIL/uL (ref 4.22–5.81)
RDW: 15.3 % (ref 11.5–15.5)
WBC: 3.2 10*3/uL — ABNORMAL LOW (ref 4.0–10.5)
nRBC: 0 % (ref 0.0–0.2)

## 2018-06-17 LAB — URINALYSIS, COMPLETE (UACMP) WITH MICROSCOPIC
Bacteria, UA: NONE SEEN
Bilirubin Urine: NEGATIVE
Glucose, UA: NEGATIVE mg/dL
Hgb urine dipstick: NEGATIVE
Ketones, ur: 5 mg/dL — AB
Leukocytes,Ua: NEGATIVE
Nitrite: NEGATIVE
Protein, ur: NEGATIVE mg/dL
Specific Gravity, Urine: 1.025 (ref 1.005–1.030)
Squamous Epithelial / LPF: NONE SEEN (ref 0–5)
pH: 6 (ref 5.0–8.0)

## 2018-06-17 LAB — ETHANOL: Alcohol, Ethyl (B): 301 mg/dL (ref <10)

## 2018-06-17 LAB — LIPASE, BLOOD: Lipase: 115 U/L — ABNORMAL HIGH (ref 11–51)

## 2018-06-17 LAB — LACTATE DEHYDROGENASE: LDH: 426 U/L — ABNORMAL HIGH (ref 98–192)

## 2018-06-17 LAB — SARS CORONAVIRUS 2 BY RT PCR (HOSPITAL ORDER, PERFORMED IN ~~LOC~~ HOSPITAL LAB): SARS Coronavirus 2: NEGATIVE

## 2018-06-17 IMAGING — CT CT ABDOMEN AND PELVIS WITH CONTRAST
2 of 6 series · 15 of 46 positions shown, 17 images · IV contrast (APPLIED)
Comparison: [DATE] abdominal radiographs.

CLINICAL DATA: Abdominal pain and vomiting for 1 week.

EXAM:
CT ABDOMEN AND PELVIS WITH CONTRAST
TECHNIQUE: Multidetector CT imaging of the abdomen and pelvis was performed
using the standard protocol following bolus administration of
intravenous contrast.
CONTRAST:  100mL OMNIPAQUE IOHEXOL 300 MG/ML  SOLN

[Series 3: thins · axial · 0.86mm/px · z∈[-910,-456]mm · 12 of 739 slices shown, 14 images]
[im 60/739  soft-tissue]
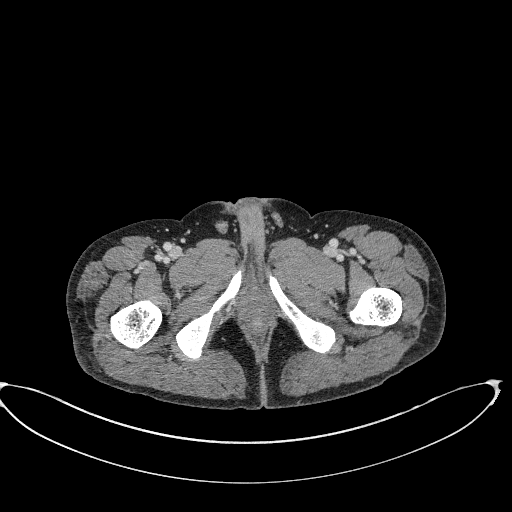
[im 60/739  bone]
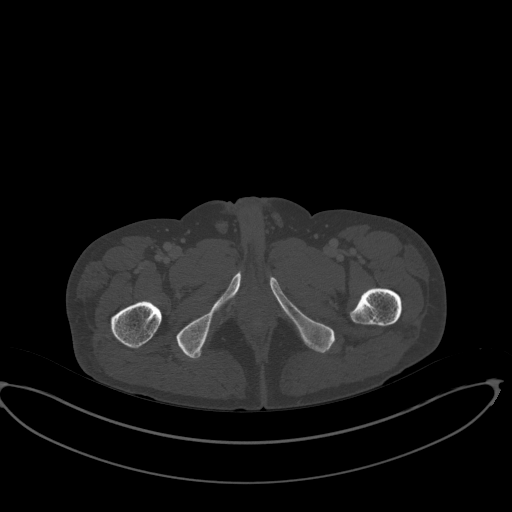
[im 119/739  soft-tissue]
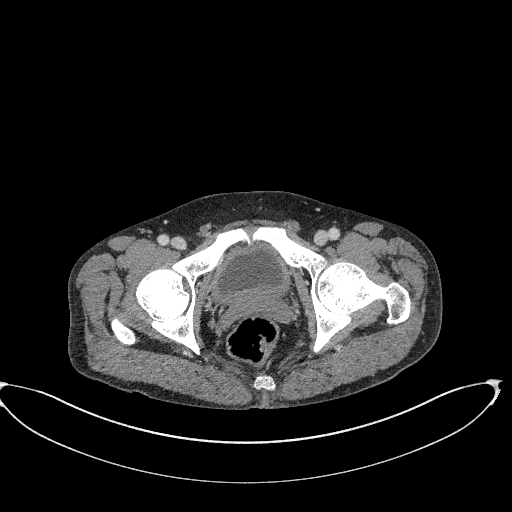
[im 178/739  soft-tissue]
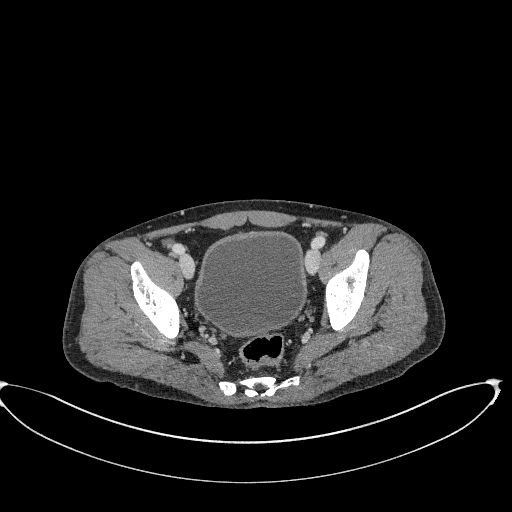
[im 237/739  soft-tissue]
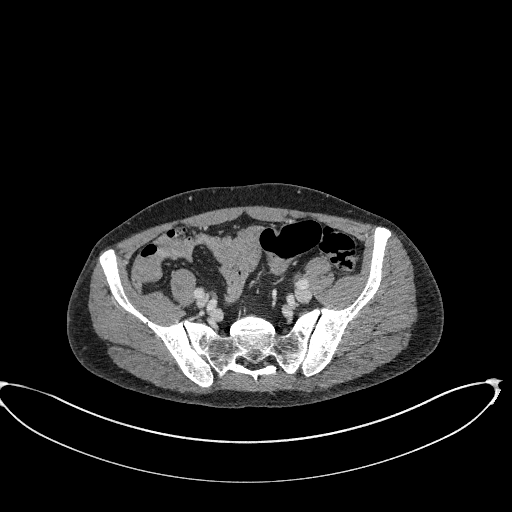
[im 296/739  soft-tissue]
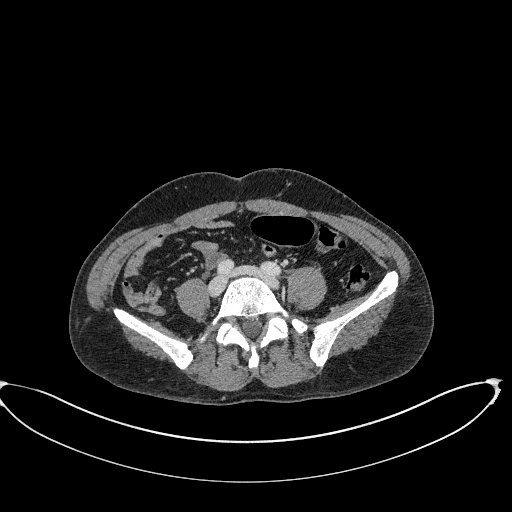
[im 355/739  soft-tissue]
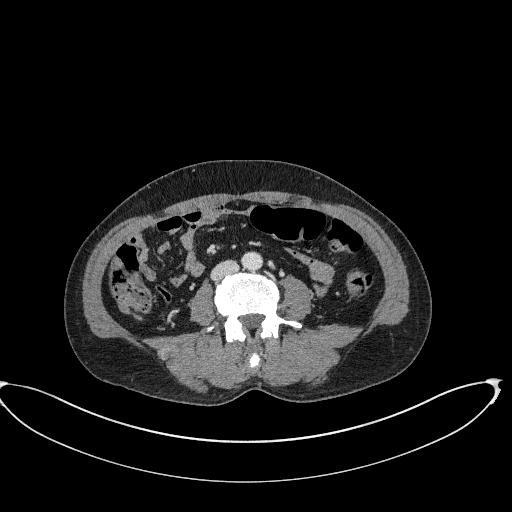
[im 414/739  soft-tissue]
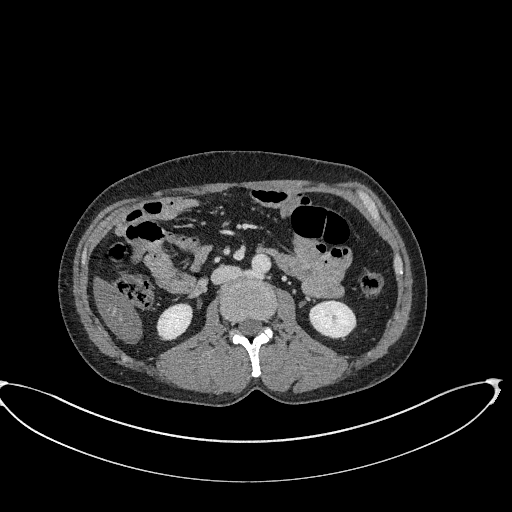
[im 473/739  soft-tissue]
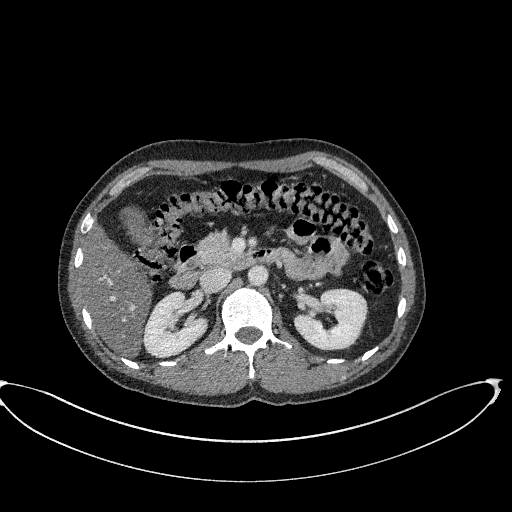
[im 532/739  soft-tissue]
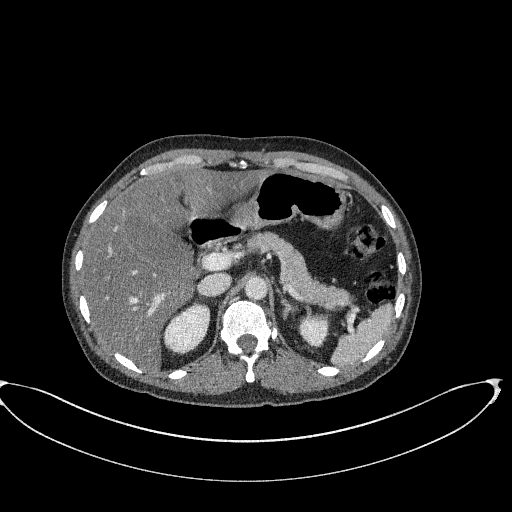
[im 532/739  bone]
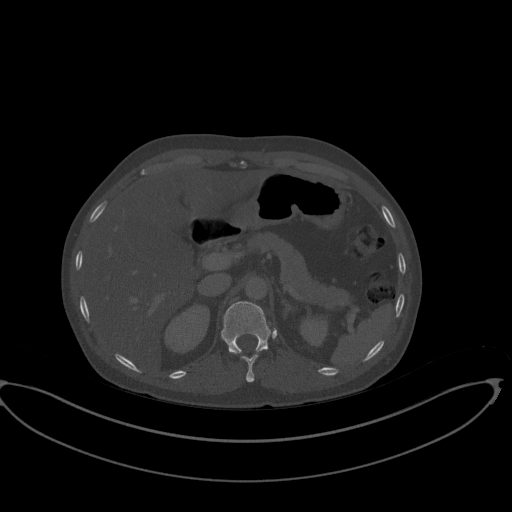
[im 591/739  soft-tissue]
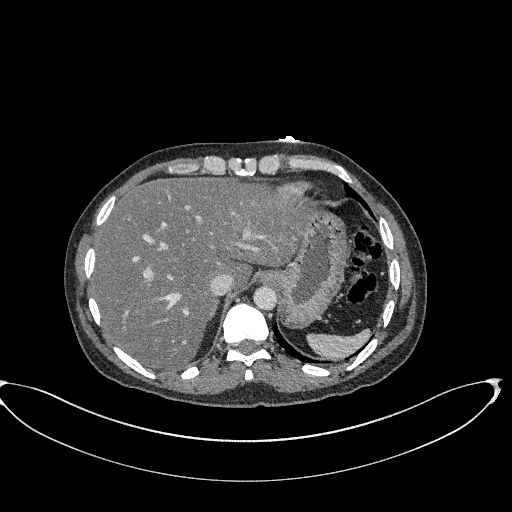
[im 650/739  soft-tissue]
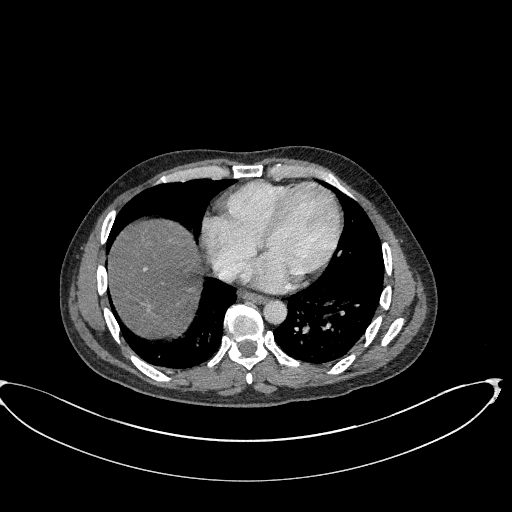
[im 709/739  soft-tissue]
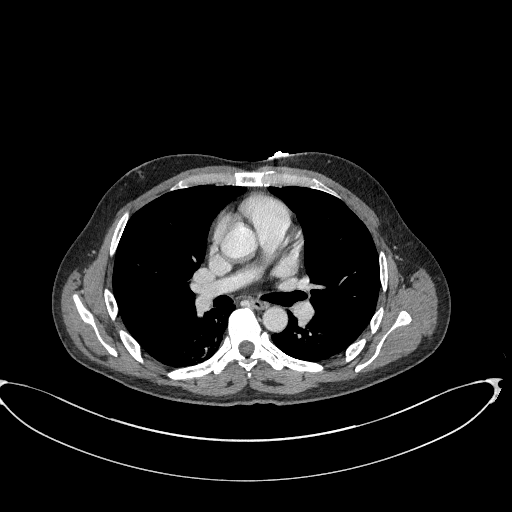

[Series 5: coronal st · coronal · 0.73mm/px · 3 of 78 slices shown]
[im 26/78  soft-tissue]
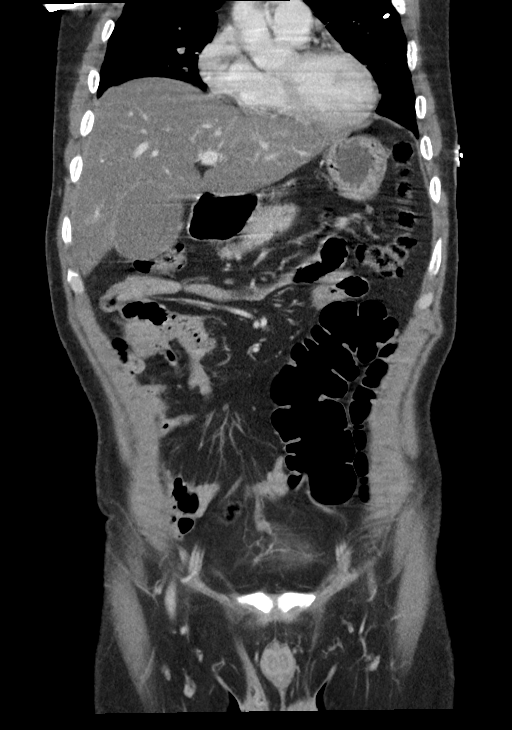
[im 35/78  soft-tissue]
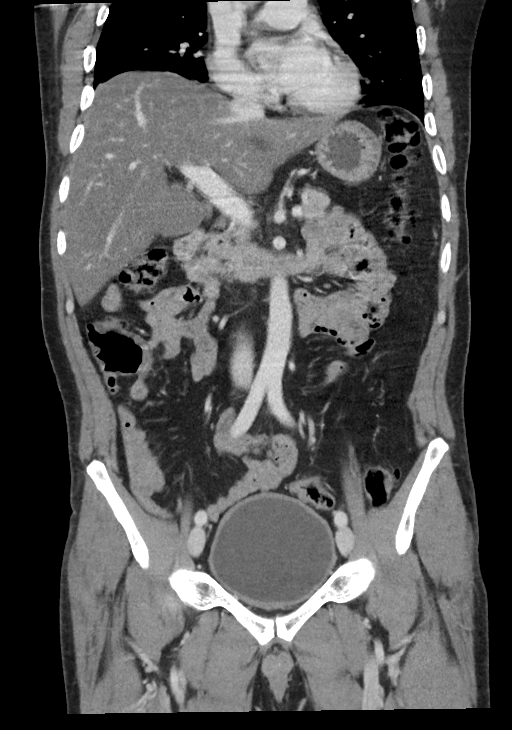
[im 43/78  soft-tissue]
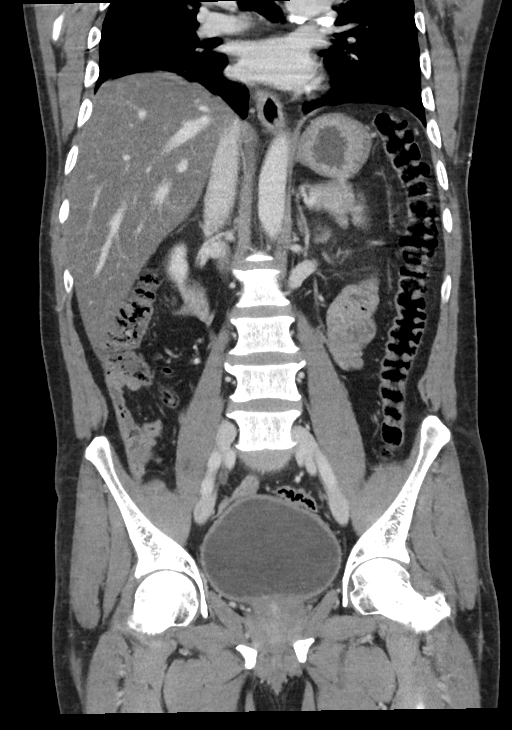

[15 of 46 positions shown; findings below may reference images not displayed]

FINDINGS: Lower chest: No significant pulmonary nodules or acute consolidative
airspace disease. Calcified left upper lobe granuloma. Granulomatous
calcified left hilar nodes.

Hepatobiliary: Diffuse hepatic steatosis. No definite liver surface
irregularity. Normal liver size. No liver masses. Normal gallbladder
with no radiopaque cholelithiasis. No biliary ductal dilatation.

Pancreas: Normal, with no mass or duct dilation.

Spleen: Normal size. No mass.

Adrenals/Urinary Tract: Normal adrenals. Normal kidneys with no
hydronephrosis and no renal mass. Mildly distended bladder with mild
diffuse bladder wall thickening.

Stomach/Bowel: Normal non-distended stomach. Normal caliber small
bowel with no small bowel wall thickening. Normal appendix. Normal
large bowel with no diverticulosis, large bowel wall thickening or
pericolonic fat stranding.

Vascular/Lymphatic: Normal caliber abdominal aorta. Patent portal,
splenic, hepatic and renal veins. No pathologically enlarged lymph
nodes in the abdomen or pelvis.

Reproductive: Normal size prostate.

Other: No pneumoperitoneum, ascites or focal fluid collection.

Musculoskeletal: No aggressive appearing focal osseous lesions.
IMPRESSION: 1. No acute abnormality. No evidence of bowel obstruction or acute
bowel inflammation. No CT findings of acute pancreatitis.
2. Diffuse hepatic steatosis.
3. Nonspecific mild diffuse bladder wall thickening. Correlation
with urinalysis suggested.

## 2018-06-17 MED ORDER — LORAZEPAM 2 MG/ML IJ SOLN
1.0000 mg | Freq: Four times a day (QID) | INTRAMUSCULAR | Status: DC | PRN
  Administered 2018-06-17 – 2018-06-18 (×3): 1 mg via INTRAVENOUS
  Filled 2018-06-17 (×3): qty 1

## 2018-06-17 MED ORDER — OXYCODONE HCL 5 MG PO TABS
5.0000 mg | ORAL_TABLET | ORAL | Status: DC | PRN
  Administered 2018-06-19: 08:00:00 5 mg via ORAL
  Filled 2018-06-17: qty 1

## 2018-06-17 MED ORDER — LORAZEPAM 1 MG PO TABS: 1.0000 mg | ORAL_TABLET | Freq: Four times a day (QID) | ORAL | Status: DC | PRN

## 2018-06-17 MED ORDER — LORAZEPAM 2 MG/ML IJ SOLN
1.0000 mg | Freq: Once | INTRAMUSCULAR | Status: AC
  Administered 2018-06-17: 09:00:00 1 mg via INTRAVENOUS
  Filled 2018-06-17: qty 1

## 2018-06-17 MED ORDER — SODIUM CHLORIDE 0.9 % IV BOLUS
1000.0000 mL | Freq: Once | INTRAVENOUS | Status: AC
  Administered 2018-06-17: 09:00:00 1000 mL via INTRAVENOUS

## 2018-06-17 MED ORDER — IOHEXOL 300 MG/ML  SOLN
100.0000 mL | Freq: Once | INTRAMUSCULAR | Status: AC | PRN
  Administered 2018-06-17: 10:00:00 100 mL via INTRAVENOUS

## 2018-06-17 MED ORDER — ONDANSETRON HCL 4 MG/2ML IJ SOLN
INTRAMUSCULAR | Status: AC
  Administered 2018-06-17: 09:00:00 4 mg
  Filled 2018-06-17: qty 2

## 2018-06-17 MED ORDER — THIAMINE HCL 100 MG/ML IJ SOLN
INTRAVENOUS | Status: DC
  Administered 2018-06-17 – 2018-06-18 (×2): via INTRAVENOUS
  Filled 2018-06-17 (×5): qty 1000

## 2018-06-17 MED ORDER — HYDROMORPHONE HCL 1 MG/ML IJ SOLN
0.5000 mg | INTRAMUSCULAR | Status: DC | PRN
  Administered 2018-06-17: 0.5 mg via INTRAVENOUS
  Filled 2018-06-17: qty 1

## 2018-06-17 MED ORDER — ENOXAPARIN SODIUM 40 MG/0.4ML ~~LOC~~ SOLN: 40.0000 mg | SUBCUTANEOUS | Status: DC

## 2018-06-17 MED ORDER — PANTOPRAZOLE SODIUM 20 MG PO TBEC
20.0000 mg | DELAYED_RELEASE_TABLET | Freq: Two times a day (BID) | ORAL | Status: DC
  Administered 2018-06-18 – 2018-06-21 (×6): 20 mg via ORAL
  Filled 2018-06-17 (×10): qty 1

## 2018-06-17 NOTE — ED Provider Notes (Signed)
[facility] Emergency Department Provider Note ____________________________________________   First MD Initiated Contact with Patient 06/17/18 [phone]     (approximate)  I have reviewed the triage vital signs and the nursing notes.   HISTORY  Chief Complaint Abdominal Pain  History of present illness obtained via in-person Spanish interpreter  HPI Samuel [name] is a 48 y.o. male with no known significant PMH who presents with abdominal pain, generalized but mainly in the upper abdomen, and radiating to his back.  In addition, the patient states he feels dehydrated and wants IV fluid.  He states that he has no job or money, and has not been eating.  He does state that he drinks daily and drank last night.  He denies any prior history of this pain.  Past Medical History:  Diagnosis Date   Alcohol use     Patient Active Problem List   Diagnosis Date Noted   Pancreatitis 06/17/2018    No past surgical history on file.  Prior to Admission medications   Medication Sig Start Date End Date Taking? Authorizing Provider  pantoprazole (PROTONIX) 20 MG tablet Take 1 tablet (20 mg total) by mouth 2 (two) times daily. Patient not taking: Reported on 06/17/2018 08/14/15   Raeford Razor, MD    Allergies Patient has no known allergies.  No family history on file.  Social History Social History   Tobacco Use   Smoking status: Never Smoker   Smokeless tobacco: Never Used  Substance Use Topics   Alcohol use: Yes    Alcohol/week: 12.0 standard drinks    Types: 12 Cans of beer per week    Comment: Pt drinks 1 pack/ day   Drug use: No    Review of Systems  Constitutional: No fever. Eyes: No redness. ENT: No sore throat. Cardiovascular: Denies chest pain. Respiratory: Denies shortness of breath. Gastrointestinal: Positive for nausea. Genitourinary: Negative for dysuria.  Musculoskeletal: Positive for back pain. Skin: Negative for  rash. Neurological: Negative for headache.   ____________________________________________   PHYSICAL EXAM:  VITAL SIGNS: ED Triage Vitals  Enc Vitals Group     BP 06/17/18 0252 (!) 184/105     Pulse Rate 06/17/18 0252 (!) 55     Resp 06/17/18 0252 20     Temp 06/17/18 0252 97.9 F (36.6 C)     Temp Source 06/17/18 0252 Oral     SpO2 06/17/18 0252 94 %     Weight 06/17/18 0253 150 lb (68 kg)     Height --      Head Circumference --      Peak Flow --      Pain Score 06/17/18 0253 10     Pain Loc --      Pain Edu? --      Excl. in GC? --     Constitutional: Alert and oriented.  Relatively comfortable appearing and in no acute distress. Eyes: Conjunctivae are normal.  No scleral icterus. Head: Atraumatic. Nose: No congestion/rhinnorhea. Mouth/Throat: Mucous membranes are moist.   Neck: Normal range of motion.  Cardiovascular: Good peripheral circulation. Respiratory: Normal respiratory effort.  No retractions.  Gastrointestinal: Soft with mild epigastric discomfort but no focal tenderness.  No distention.  Genitourinary: No flank tenderness. Musculoskeletal: Extremities warm and well perfused.  Neurologic:  Normal speech and language. No gross focal neurologic deficits are appreciated.  Skin:  Skin is warm and dry. No rash noted. Psychiatric: Mood and affect are normal. Speech and behavior are normal.  ____________________________________________  [name] (all [name] ordered are listed, but only abnormal results are displayed)  [name] Reviewed  CBC - Abnormal; Notable for the following components:      Result Value   WBC 3.2 (*)    Platelets 64 (*)    All other components within normal limits  URINALYSIS, COMPLETE (UACMP) WITH MICROSCOPIC - Abnormal; Notable for the following components:   Color, Urine YELLOW (*)    APPearance CLEAR (*)    Ketones, ur 5 (*)    All other components within normal limits  COMPREHENSIVE METABOLIC PANEL - Abnormal; Notable for the  following components:   Chloride 96 (*)    Glucose, Bld 106 (*)    BUN 5 (*)    Creatinine, Ser 0.58 (*)    Calcium 8.7 (*)    Total Protein 8.9 (*)    Albumin 5.1 (*)    AST 416 (*)    ALT 169 (*)    Anion gap 16 (*)    All other components within normal limits  LIPASE, BLOOD - Abnormal; Notable for the following components:   Lipase 115 (*)    All other components within normal limits  LACTATE DEHYDROGENASE - Abnormal; Notable for the following components:   LDH 426 (*)    All other components within normal limits  ETHANOL - Abnormal; Notable for the following components:   Alcohol, Ethyl (B) 301 (*)    All other components within normal limits  SARS CORONAVIRUS 2 (HOSPITAL ORDER, PERFORMED IN  HOSPITAL LAB)  HIV ANTIBODY (ROUTINE TESTING W REFLEX)   ____________________________________________  EKG   ____________________________________________  RADIOLOGY  CT abdomen: No acute abnormality  ____________________________________________   PROCEDURES  Procedure(s) performed: No  Procedures  Critical Care performed: No ____________________________________________   INITIAL IMPRESSION / ASSESSMENT AND PLAN / ED COURSE  Pertinent [name] & imaging results that were available during my care of the patient were reviewed by me and considered in my medical decision making (see chart for details).  48 year old male with no significant past medical history other than alcohol abuse presents with abdominal pain and stating that he wants an IV.  The patient admits to drinking last night and appeared intoxicated to the triage RN.  On exam he is relatively well-appearing.  He appears mildly intoxicated at this point, but is oriented x3 and answering my questions appropriately.  His blood pressure is elevated but other vital signs are normal.  The abdomen is soft with mild epigastric discomfort but no focal tenderness.  Lab work-up obtained from triage is significant  for elevated lipase, as well as elevated LFTs which have increased when compared to most recent [name] from 2 years ago.  His total bilirubin is normal.  Overall presentation is concerning for pancreatitis.  The patient has no focal right upper quadrant pain to suggest cholelithiasis or biliary duct stone, and I suspect it is most likely alcohol related.  The patient states that he really just wants an IV and some fluids.  I informed him about the results of the [name] and recommended that we obtain a CT to further evaluate.  I also recommended that he be admitted, however the patient declines.  Although he appears mildly intoxicated, he does demonstrate understanding of what I am telling him and has capacity to decline care.  We will give fluids, Zofran, and reassess.  Once the patient has received fluids, if he is fully sober I will reassess if he is willing to be admitted or wants to  sign out AMA.  ----------------------------------------- 3:01 PM on 06/17/2018 -----------------------------------------  The patient's alcohol was 301, indicating he was more intoxicated than he initially appeared.  LDH is also elevated.  The patient is feeling somewhat better after fluids.  After further discussion with him via the interpreter, he is agreeable to admission.  He was agreeable to a CT scan and this is negative.  I signed him out to the hospitalist.   ____________________________________________   FINAL CLINICAL IMPRESSION(S) / ED DIAGNOSES  Final diagnoses:  Alcohol-induced acute pancreatitis without infection or necrosis      NEW MEDICATIONS STARTED DURING THIS VISIT:  New Prescriptions   No medications on file     Note:  This document was prepared using Dragon voice recognition software and may include unintentional dictation errors.   Dionne Bucy, MD 06/17/18 [phone]

## 2018-06-17 NOTE — ED Notes (Signed)
Pt given water and urged to drink slowly to minimize gastric upset

## 2018-06-17 NOTE — ED Notes (Signed)
Patient observed sitting in lobby, no acute distress noted.

## 2018-06-17 NOTE — ED Notes (Signed)
Using [employer] Samuel [name] [phone]) patient had question regarding his IV and about seeing MD.

## 2018-06-17 NOTE — ED Notes (Signed)
.. ED TO INPATIENT HANDOFF REPORT  ED Nurse Name and Phone #: Eula Flaxnnie [phone]  S Name/Age/Gender [name] 48 [facility] Room/Bed: ED14A/ED14A  Code Status   Code Status: Prior  Home/SNF/Other Home Patient oriented to: self, place, time and situation Is this baseline? Yes   Triage Complete: Triage complete  Chief Complaint ETOH EMS  Triage Note Info obtained via [employer] [employer] [employer], [place]firio Mylar[place]armen [phone]).  Patient reports having abdominal pain with vomiting for a week.  Patient also reports drinking 3 beers tonight.  Patient states that his stomach hurts because he has not been able to afford to buy any food.   Allergies No Known Allergies  Level of Care/Admitting Diagnosis ED Disposition    ED Disposition Condition Comment   Admit  The patient appears reasonably stabilized for admission considering the current resources, flow, and capabilities available in the ED at this time, and I doubt any other [facility] requiring further screening and/or treatment in the ED prior to admission is  present.       B Medical/Surgery History Past Medical History:  Diagnosis Date  . Alcohol use    No past surgical history on file.   A IV Location/Drains/Wounds Patient Lines/Drains/Airways Status   Active Line/Drains/Airways    Name:   Placement date:   Placement time:   Site:   Days:   Peripheral IV 06/17/18 Right Antecubital   06/17/18    -    Antecubital   less than 1          Intake/Output Last 24 hours No intake or output data in the 24 hours ending 06/17/18 1331  Labs/Imaging Results for orders placed or performed during the hospital encounter of 06/17/18 (from the past 48 hour(s))  CBC     Status: Abnormal   Collection Time: 06/17/18  2:56 AM  Result Value Ref Range   WBC 3.2 (L) 4.0 - 10.5 K/uL   RBC 4.62 4.22 - 5.81 MIL/uL   Hemoglobin 14.3 13.0 - 17.0 g/dL   HCT 04.541.0 40.939.0 - 81.152.0 %   MCV 88.7 80.0 - 100.0 fL   MCH 31.0 26.0 - 34.0 pg   MCHC 34.9 30.0 - 36.0  g/dL   RDW 91.415.3 78.211.5 - 95.615.5 %   Platelets 64 (L) 150 - 400 K/uL    Comment: PLATELET COUNT CONFIRMED BY SMEAR Immature Platelet Fraction may be clinically indicated, consider ordering this additional test [id number]    nRBC 0.0 0.0 - 0.2 %    Comment: Performed at [facility], [address] [place], [place] [address]  Comprehensive metabolic panel     Status: Abnormal   Collection Time: 06/17/18  3:47 AM  Result Value Ref Range   Sodium 137 135 - 145 mmol/L   Potassium 4.1 3.5 - 5.1 mmol/L   Chloride 96 (L) 98 - 111 mmol/L   CO2 25 22 - 32 mmol/L   Glucose, Bld 106 (H) 70 - 99 mg/dL   BUN 5 (L) 6 - 20 mg/dL   Creatinine, Ser 2.950.58 (L) 0.61 - 1.24 mg/dL   Calcium 8.7 (L) 8.9 - 10.3 mg/dL   Total Protein 8.9 (H) 6.5 - 8.1 g/dL   Albumin 5.1 (H) 3.5 - 5.0 g/dL   AST 284416 (H) 15 - 41 U/L   ALT 169 (H) 0 - 44 U/L   Alkaline Phosphatase 112 38 - 126 U/L   Total Bilirubin 1.1 0.3 - 1.2 mg/dL   GFR calc non Af Amer >60 >60 mL/min  GFR calc Af Amer >60 >60 mL/min   Anion gap 16 (H) 5 - 15    Comment: Performed at [facility], [address] [place], [name] [address]  Lipase, blood     Status: Abnormal   Collection Time: 06/17/18  3:47 AM  Result Value Ref Range   Lipase 115 (H) 11 - 51 U/L    Comment: Performed at [facility], [address] [place], [name] [address]  Lactate dehydrogenase     Status: Abnormal   Collection Time: 06/17/18  8:26 AM  Result Value Ref Range   LDH 426 (H) 98 - 192 U/L    Comment: Performed at [facility], [address] [place], [name] [address]  Ethanol     Status: Abnormal   Collection Time: 06/17/18  8:26 AM  Result Value Ref Range   Alcohol, Ethyl (B) 301 (HH) <10 mg/dL    Comment: CRITICAL RESULT CALLED TO, READ BACK BY AND VERIFIED WITH HUNTER ORR AT 0852 ON 06/17/2018 [facility]. (NOTE) Lowest detectable limit for serum alcohol is 10 mg/dL. For medical purposes only. Performed at [facility], [address] [place], [name] [address]   SARS Coronavirus 2 (CEPHEID - Performed in [facility] hospital lab), Hosp Order     Status: None   Collection Time: 06/17/18 10:47 AM  Result Value Ref Range   SARS Coronavirus 2 NEGATIVE NEGATIVE    Comment: (NOTE) If result is NEGATIVE SARS-CoV-2 target nucleic acids are NOT DETECTED. The SARS-CoV-2 RNA is generally detectable in upper and lower  respiratory specimens during the acute phase of infection. The lowest  concentration of SARS-CoV-2 viral copies this assay can detect is 250  copies / mL. A negative result does not preclude SARS-CoV-2 infection  and should not be used as the sole basis for treatment or other  patient management decisions.  A negative result may occur with  improper specimen collection / handling, submission of specimen other  than nasopharyngeal swab, presence of viral mutation(s) within the  areas targeted by this assay, and inadequate number of viral copies  (<250 copies / mL). A negative result must be combined with clinical  observations, patient history, and epidemiological information. If result is POSITIVE SARS-CoV-2 target nucleic acids are DETECTED. The SARS-CoV-2 RNA is generally detectable in upper and lower  respiratory specimens dur ing the acute phase of infection.  Positive  results are indicative of active infection with SARS-CoV-2.  Clinical  correlation with patient history and other diagnostic information is  necessary to determine patient infection status.  Positive results do  not rule out bacterial infection or co-infection with other viruses. If result is PRESUMPTIVE POSTIVE SARS-CoV-2 nucleic acids MAY BE PRESENT.   A presumptive positive result was obtained on the submitted specimen  and confirmed on repeat testing.  While 2019 novel coronavirus  (SARS-CoV-2) nucleic acids may be present in the submitted sample  additional confirmatory testing may be necessary for  epidemiological  and / or clinical management purposes  to differentiate between  SARS-CoV-2 and other Sarbecovirus currently known to infect humans.  If clinically indicated additional testing with an alternate test  methodology [phone]) is advised. The SARS-CoV-2 RNA is generally  detectable in upper and lower respiratory sp ecimens during the acute  phase of infection. The expected result is Negative. Fact Sheet for Patients:  BoilerBrush.com.cy Fact Sheet for Healthcare Providers: https://pope.com/ This test is not yet approved or cleared by the [name] FDA and  has been authorized for detection and/or diagnosis of SARS-CoV-2 by FDA under an Emergency Use Authorization (EUA).  This EUA will remain in effect (meaning this test can be used) for the duration of the COVID-19 declaration under Section 564(b)(1) of the Act, 21 U.S.C. section 360bbb-3(b)(1), unless the authorization is terminated or revoked sooner. Performed at [facility], [address] [place], [name] [address]   Urinalysis, Complete w Microscopic     Status: Abnormal   Collection Time: 06/17/18 12:04 PM  Result Value Ref Range   Color, Urine YELLOW (A) YELLOW   APPearance CLEAR (A) CLEAR   Specific Gravity, Urine 1.025 1.005 - 1.030   pH 6.0 5.0 - 8.0   Glucose, UA NEGATIVE NEGATIVE mg/dL   Hgb urine dipstick NEGATIVE NEGATIVE   Bilirubin Urine NEGATIVE NEGATIVE   Ketones, ur 5 (A) NEGATIVE mg/dL   Protein, ur NEGATIVE NEGATIVE mg/dL   Nitrite NEGATIVE NEGATIVE   Leukocytes,Ua NEGATIVE NEGATIVE   RBC / HPF 0-5 0 - 5 RBC/hpf   WBC, UA 0-5 0 - 5 WBC/hpf   Bacteria, UA NONE SEEN NONE SEEN   Squamous Epithelial / LPF NONE SEEN 0 - 5    Comment: Performed at [facility], [address] [place], [name] [address]   Ct Abdomen Pelvis W Contrast  Result Date: 06/17/2018 CLINICAL DATA:  Abdominal pain and vomiting for 1 week. EXAM: CT  ABDOMEN AND PELVIS WITH CONTRAST TECHNIQUE: Multidetector CT imaging of the abdomen and pelvis was performed using the standard protocol following bolus administration of intravenous contrast. CONTRAST:  OMNIPAQUE IOHEXOL 300 MG/ML  SOLN COMPARISON:  08/14/2015 abdominal radiographs. FINDINGS: Lower chest: No significant pulmonary nodules or acute consolidative airspace disease. Calcified left upper lobe granuloma. Granulomatous calcified left hilar nodes. Hepatobiliary: Diffuse hepatic steatosis. No definite liver surface irregularity. Normal liver size. No liver masses. Normal gallbladder with no radiopaque cholelithiasis. No biliary ductal dilatation. Pancreas: Normal, with no mass or duct dilation. Spleen: Normal size. No mass. Adrenals/Urinary Tract: Normal adrenals. Normal kidneys with no hydronephrosis and no renal mass. Mildly distended bladder with mild diffuse bladder wall thickening. Stomach/Bowel: Normal non-distended stomach. Normal caliber small bowel with no small bowel wall thickening. Normal appendix. Normal large bowel with no diverticulosis, large bowel wall thickening or pericolonic fat stranding. Vascular/Lymphatic: Normal caliber abdominal aorta. Patent portal, splenic, hepatic and renal veins. No pathologically enlarged lymph nodes in the abdomen or pelvis. Reproductive: Normal size prostate. Other: No pneumoperitoneum, ascites or focal fluid collection. Musculoskeletal: No aggressive appearing focal osseous lesions. IMPRESSION: 1. No acute abnormality. No evidence of bowel obstruction or acute bowel inflammation. No CT findings of acute pancreatitis. 2. Diffuse hepatic steatosis. 3. Nonspecific mild diffuse bladder wall thickening. Correlation with urinalysis suggested. Electronically Signed   By: Delbert Phenix M.D.   On: 06/17/2018 10:54    Pending Labs Unresulted Labs (From admission, onward)   None      Vitals/Pain Today's Vitals   06/17/18 0253 06/17/18 0905 06/17/18 1048  06/17/18 1100  BP:  (!) 142/94 (!) 118/102 137/84  Pulse:  90 91 92  Resp:    20  Temp:      TempSrc:      SpO2:    95%  Weight: 68 kg     PainSc: 10-Worst pain ever       Isolation Precautions No active isolations  Medications Medications  sodium chloride 0.9 % bolus 1,000 mL (1,000 mLs Intravenous New Bag/Given 06/17/18 0835)  sodium chloride 0.9 %  bolus 1,000 mL (1,000 mLs Intravenous New Bag/Given 06/17/18 0832)  ondansetron (ZOFRAN) 4 MG/2ML injection (4 mg  Given 06/17/18 0834)  LORazepam (ATIVAN) injection 1 mg (1 mg Intravenous Given 06/17/18 0903)  iohexol (OMNIPAQUE) 300 MG/ML solution 100 mL (100 mLs Intravenous Contrast Given 06/17/18 1023)    Mobility walks Moderate fall risk   Focused Assessments GI   R Recommendations: See Admitting Provider Note  Report given to:   Additional Notes: Needs interpreter

## 2018-06-17 NOTE — ED Triage Notes (Signed)
Info obtained via [employer], [place] [phone]).  Patient reports having abdominal pain with vomiting for a week.  Patient also reports drinking 3 beers tonight.  Patient states that his stomach hurts because he has not been able to afford to buy any food.

## 2018-06-17 NOTE — H&P (Signed)
[facility]   PATIENT NAME: Samuel [name]    MR#:  [id number]  DATE OF BIRTH:  1970/02/13  DATE OF ADMISSION:  06/17/2018  PRIMARY CARE PHYSICIAN: Patient, No Pcp Per   REQUESTING/REFERRING PHYSICIAN: Dionne Bucy   CHIEF COMPLAINT:   Chief Complaint  Patient presents with  . Abdominal Pain    HISTORY OF PRESENT ILLNESS:  Samuel [name]  is a 48 y.o. male, Spanish speaking with a known history of history of alcohol abuse who presented to the emergency room with complaints of abdominal pains mostly due to upper quadrant.  Radiating to the back.  Associated with nausea and vomiting which is been going on for about 1 week.  Patient reported drinking 3 beers last night.  No chest pain.  No shortness of breath.  Patient was evaluated in the emergency room.  Found to have lipase level mildly elevated at 115.  Noted elevated liver enzymes with AST of 416.  ALT of 169.  Alkaline phosphatase of 112.  CT scan of the abdomen and pelvis done with no acute abnormality.  No CT scan finding of acute pancreatitis.  Normal gallbladder with no radiopaque cholelithiasis.  No biliary ductal dilatation.  Patient was diagnosed with acute pancreatitis clinically however.  Started on IV fluids.  Patient was initially intoxicated on arrival to the emergency room last night.  Medical service called to admit patient for further evaluation and management.  PAST MEDICAL HISTORY:   Past Medical History:  Diagnosis Date  . Alcohol use     PAST SURGICAL HISTORY:  No past surgical history on file.  SOCIAL HISTORY:   Social History   Tobacco Use  . Smoking status: Never Smoker  . Smokeless tobacco: Never Used  Substance Use Topics  . Alcohol use: Yes    Alcohol/week: 12.0 standard drinks    Types: 12 Cans of beer per week    Comment: Pt drinks 1 pack/ day    FAMILY HISTORY:  No family history on file.  DRUG ALLERGIES:  No Known Allergies  REVIEW  OF SYSTEMS:   Review of Systems  Constitutional: Negative for chills and fever.  HENT: Negative for hearing loss and tinnitus.   Eyes: Negative for blurred vision and double vision.  Respiratory: Negative for cough and hemoptysis.   Cardiovascular: Negative for chest pain and palpitations.  Gastrointestinal: Positive for abdominal pain, nausea and vomiting. Negative for heartburn.  Genitourinary: Negative for dysuria and urgency.  Musculoskeletal: Negative for myalgias.  Skin: Negative for rash.  Neurological: Negative for dizziness and headaches.  Psychiatric/Behavioral: Negative for depression and hallucinations.    MEDICATIONS AT HOME:   Prior to Admission medications   Medication Sig Start Date End Date Taking? Authorizing Provider  pantoprazole (PROTONIX) 20 MG tablet Take 1 tablet (20 mg total) by mouth 2 (two) times daily. Patient not taking: Reported on 06/17/2018 08/14/15   Raeford Razor, MD      VITAL SIGNS:  Blood pressure 131/79, pulse 86, temperature 97.9 F (36.6 C), temperature source Oral, resp. rate 17, weight 68 kg, SpO2 97 %.  PHYSICAL EXAMINATION:  Physical Exam  GENERAL:  48 y.o.-year-old patient lying in the bed with no acute distress.  EYES: Pupils equal, round, reactive to light and accommodation. No scleral icterus. Extraocular muscles intact.  HEENT: Head atraumatic, normocephalic. Oropharynx and nasopharynx clear.  NECK:  Supple, no jugular venous distention. No thyroid enlargement, no tenderness.  LUNGS: Normal breath sounds bilaterally, no wheezing,  rales,rhonchi or crepitation. No use of accessory muscles of respiration.  CARDIOVASCULAR: S1, S2 normal. No murmurs, rubs, or gallops.  ABDOMEN: Soft, mild epigastric tenderness but no rebound or guarding.  Bowel sounds present. No organomegaly or mass.  EXTREMITIES: No pedal edema, cyanosis, or clubbing.  NEUROLOGIC: Cranial nerves II through XII are intact. Muscle strength 5/5 in all extremities.  Sensation intact. Gait not checked.  PSYCHIATRIC: The patient is alert and oriented x 3.  SKIN: No obvious rash, lesion, or ulcer.   LABORATORY PANEL:   CBC Recent [name]  Lab 06/17/18 0256  WBC 3.2*  HGB 14.3  HCT 41.0  PLT 64*   ------------------------------------------------------------------------------------------------------------------  Chemistries  Recent [name]  Lab 06/17/18 0347  NA 137  K 4.1  CL 96*  CO2 25  GLUCOSE 106*  BUN 5*  CREATININE 0.58*  CALCIUM 8.7*  AST 416*  ALT 169*  ALKPHOS 112  BILITOT 1.1   ------------------------------------------------------------------------------------------------------------------  Cardiac Enzymes No results for input(s): TROPONINI in the last 168 hours. ------------------------------------------------------------------------------------------------------------------  RADIOLOGY:  Ct Abdomen Pelvis W Contrast  Result Date: 06/17/2018 CLINICAL DATA:  Abdominal pain and vomiting for 1 week. EXAM: CT ABDOMEN AND PELVIS WITH CONTRAST TECHNIQUE: Multidetector CT imaging of the abdomen and pelvis was performed using the standard protocol following bolus administration of intravenous contrast. CONTRAST:  100mL OMNIPAQUE IOHEXOL 300 MG/ML  SOLN COMPARISON:  08/14/2015 abdominal radiographs. FINDINGS: Lower chest: No significant pulmonary nodules or acute consolidative airspace disease. Calcified left upper lobe granuloma. Granulomatous calcified left hilar nodes. Hepatobiliary: Diffuse hepatic steatosis. No definite liver surface irregularity. Normal liver size. No liver masses. Normal gallbladder with no radiopaque cholelithiasis. No biliary ductal dilatation. Pancreas: Normal, with no mass or duct dilation. Spleen: Normal size. No mass. Adrenals/Urinary Tract: Normal adrenals. Normal kidneys with no hydronephrosis and no renal mass. Mildly distended bladder with mild diffuse bladder wall thickening. Stomach/Bowel: Normal  non-distended stomach. Normal caliber small bowel with no small bowel wall thickening. Normal appendix. Normal large bowel with no diverticulosis, large bowel wall thickening or pericolonic fat stranding. Vascular/Lymphatic: Normal caliber abdominal aorta. Patent portal, splenic, hepatic and renal veins. No pathologically enlarged lymph nodes in the abdomen or pelvis. Reproductive: Normal size prostate. Other: No pneumoperitoneum, ascites or focal fluid collection. Musculoskeletal: No aggressive appearing focal osseous lesions. IMPRESSION: 1. No acute abnormality. No evidence of bowel obstruction or acute bowel inflammation. No CT findings of acute pancreatitis. 2. Diffuse hepatic steatosis. 3. Nonspecific mild diffuse bladder wall thickening. Correlation with urinalysis suggested. Electronically Signed   By: Delbert PhenixJason A Poff M.D.   On: 06/17/2018 10:54      IMPRESSION AND PLAN:  Patient is a 48 year old Spanish-speaking male diagnosed with acute pancreatitis clinically.  Alcohol induced.  1.  Alcohol induced pancreatitis Patient presented initially intoxicated with alcohol level of 301.  This morning patient awake and alert with no evidence of intoxication. Presented with abdominal pain mostly epigastric with nausea and vomiting.  Lipase level slightly elevated.  Diagnosed with acute pancreatitis clinically.  Patient has been n.p.o. since last night. Started on clear liquids.  PRN pain control.  IV fluid hydration.  2.  Alcohol abuse Counseling to quit.  Placed on IV banana bag.  PRN Ativan Initially presented with alcohol intoxication.  Stable this morning.  Monitor for withdrawal symptoms.  Placed on CIWA protocol.  3.  Elevated liver enzymes Most likely secondary to alcohol abuse.  Follow-up on hepatitis panel.  Follow-up on LFTs in a.m.  4.  Thrombocytopenia with platelet count  of 64 Likely due to alcohol abuse.  Clinically no evidence of bleeding.  Follow-up on CBC in a.m.  DVT prophylaxis;  SCDs No heparin products for now due to thrombocytopenia.   All the records are reviewed and case discussed with ED provider. Management plans discussed with the patient, family and they are in agreement.  CODE STATUS: Full code  TOTAL TIME TAKING CARE OF THIS PATIENT: 50 minutes.    Giovanni Biby M.D on 06/17/2018 at 2:06 PM  Between 7am to 6pm - Pager - [phone]  After 6pm go to [email or web address] - Social research officer, government  [facility] [facility]  Office  [phone]  CC: Primary care physician; Patient, No Pcp Per   Note: This dictation was prepared with Dragon dictation along with smaller phrase technology. Any transcriptional errors that result from this process are unintentional.

## 2018-06-17 NOTE — ED Notes (Signed)
Patient to triage by wheelchair by EMS.  Patient is intoxicated and having abdominal pain.  Saline loc to right antecub via 18g.   BP 155/99, normal sinus 80's heart rate, pulse oxi 100% on room air, cbg 141.

## 2018-06-18 ENCOUNTER — Encounter: Payer: Self-pay | Admitting: *Deleted

## 2018-06-18 LAB — LIPASE, BLOOD: Lipase: 71 U/L — ABNORMAL HIGH (ref 11–51)

## 2018-06-18 LAB — COMPREHENSIVE METABOLIC PANEL
ALT: 129 U/L — ABNORMAL HIGH (ref 0–44)
AST: 246 U/L — ABNORMAL HIGH (ref 15–41)
Albumin: 4.4 g/dL (ref 3.5–5.0)
Alkaline Phosphatase: 94 U/L (ref 38–126)
Anion gap: 15 (ref 5–15)
BUN: 6 mg/dL (ref 6–20)
CO2: 26 mmol/L (ref 22–32)
Calcium: 8 mg/dL — ABNORMAL LOW (ref 8.9–10.3)
Chloride: 93 mmol/L — ABNORMAL LOW (ref 98–111)
Creatinine, Ser: 0.6 mg/dL — ABNORMAL LOW (ref 0.61–1.24)
GFR calc Af Amer: >60 mL/min (ref >60)
GFR calc non Af Amer: >60 mL/min (ref >60)
Glucose, Bld: 77 mg/dL (ref 70–99)
Potassium: 3.8 mmol/L (ref 3.5–5.1)
Sodium: 134 mmol/L — ABNORMAL LOW (ref 135–145)
Total Bilirubin: 1.9 mg/dL — ABNORMAL HIGH (ref 0.3–1.2)
Total Protein: 7.6 g/dL (ref 6.5–8.1)

## 2018-06-18 LAB — CBC
HCT: 37 % — ABNORMAL LOW (ref 39.0–52.0)
Hemoglobin: 12.5 g/dL — ABNORMAL LOW (ref 13.0–17.0)
MCH: 30.9 pg (ref 26.0–34.0)
MCHC: 33.8 g/dL (ref 30.0–36.0)
MCV: 91.4 fL (ref 80.0–100.0)
Platelets: 47 10*3/uL — ABNORMAL LOW (ref 150–400)
RBC: 4.05 MIL/uL — ABNORMAL LOW (ref 4.22–5.81)
RDW: 15.5 % (ref 11.5–15.5)
WBC: 3.2 10*3/uL — ABNORMAL LOW (ref 4.0–10.5)
nRBC: 0 % (ref 0.0–0.2)

## 2018-06-18 LAB — ETHANOL: Alcohol, Ethyl (B): <10 mg/dL (ref <10)

## 2018-06-18 LAB — PHOSPHORUS: Phosphorus: 2.5 mg/dL (ref 2.5–4.6)

## 2018-06-18 LAB — MAGNESIUM: Magnesium: 1.8 mg/dL (ref 1.7–2.4)

## 2018-06-18 MED ORDER — SODIUM CHLORIDE 0.9 % IV BOLUS
1000.0000 mL | Freq: Once | INTRAVENOUS | Status: AC
  Administered 2018-06-18: 1000 mL via INTRAVENOUS

## 2018-06-18 MED ORDER — LORAZEPAM 1 MG PO TABS: 1.0000 mg | ORAL_TABLET | ORAL | Status: DC | PRN

## 2018-06-18 MED ORDER — DIPHENHYDRAMINE HCL 50 MG/ML IJ SOLN
INTRAMUSCULAR | Status: AC
  Filled 2018-06-18: qty 1

## 2018-06-18 MED ORDER — DIPHENHYDRAMINE HCL 50 MG/ML IJ SOLN
25.0000 mg | Freq: Once | INTRAMUSCULAR | Status: AC
  Administered 2018-06-18: 25 mg via INTRAVENOUS

## 2018-06-18 MED ORDER — LORAZEPAM 2 MG/ML IJ SOLN: 0.0000 mg | Freq: Two times a day (BID) | INTRAMUSCULAR | Status: DC

## 2018-06-18 MED ORDER — LORAZEPAM 2 MG/ML IJ SOLN
2.0000 mg | INTRAMUSCULAR | Status: DC | PRN
  Administered 2018-06-18 – 2018-06-23 (×6): 2 mg via INTRAVENOUS
  Filled 2018-06-18 (×8): qty 1

## 2018-06-18 MED ORDER — ADULT MULTIVITAMIN W/MINERALS CH
1.0000 | ORAL_TABLET | Freq: Every day | ORAL | Status: DC
  Administered 2018-06-18 – 2018-06-25 (×8): 1 via ORAL
  Filled 2018-06-18 (×8): qty 1

## 2018-06-18 MED ORDER — LORAZEPAM 2 MG PO TABS: 2.0000 mg | ORAL_TABLET | ORAL | Status: DC | PRN

## 2018-06-18 MED ORDER — FOLIC ACID 1 MG PO TABS
1.0000 mg | ORAL_TABLET | Freq: Every day | ORAL | Status: DC
  Administered 2018-06-18 – 2018-06-25 (×8): 1 mg via ORAL
  Filled 2018-06-18 (×8): qty 1

## 2018-06-18 MED ORDER — LORAZEPAM 2 MG/ML IJ SOLN
1.0000 mg | INTRAMUSCULAR | Status: DC | PRN
  Administered 2018-06-18 (×2): 1 mg via INTRAVENOUS
  Filled 2018-06-18 (×2): qty 1

## 2018-06-18 MED ORDER — VITAMIN B-1 100 MG PO TABS
100.0000 mg | ORAL_TABLET | Freq: Every day | ORAL | Status: DC
  Administered 2018-06-18 – 2018-06-25 (×8): 100 mg via ORAL
  Filled 2018-06-18 (×8): qty 1

## 2018-06-18 MED ORDER — LORAZEPAM 2 MG/ML IJ SOLN
0.0000 mg | Freq: Four times a day (QID) | INTRAMUSCULAR | Status: DC
  Administered 2018-06-18 – 2018-06-19 (×3): 2 mg via INTRAVENOUS
  Administered 2018-06-19: 16:00:00 4 mg via INTRAVENOUS
  Administered 2018-06-20: 10:00:00 2 mg via INTRAVENOUS
  Filled 2018-06-18: qty 2
  Filled 2018-06-18 (×3): qty 1

## 2018-06-18 MED ORDER — LACTATED RINGERS IV SOLN
INTRAVENOUS | Status: DC
  Administered 2018-06-18 – 2018-06-20 (×4): via INTRAVENOUS

## 2018-06-18 MED ORDER — METOPROLOL TARTRATE 25 MG PO TABS
25.0000 mg | ORAL_TABLET | Freq: Two times a day (BID) | ORAL | Status: DC
  Administered 2018-06-18 – 2018-06-24 (×11): 25 mg via ORAL
  Filled 2018-06-18 (×14): qty 1

## 2018-06-18 MED ORDER — HALOPERIDOL LACTATE 5 MG/ML IJ SOLN
5.0000 mg | Freq: Once | INTRAMUSCULAR | Status: AC
  Administered 2018-06-18: 5 mg via INTRAVENOUS

## 2018-06-18 MED ORDER — HALOPERIDOL LACTATE 5 MG/ML IJ SOLN
INTRAMUSCULAR | Status: AC
  Filled 2018-06-18: qty 1

## 2018-06-18 MED ORDER — CHLORDIAZEPOXIDE HCL 5 MG PO CAPS
25.0000 mg | ORAL_CAPSULE | Freq: Three times a day (TID) | ORAL | Status: DC
  Administered 2018-06-18 (×2): 25 mg via ORAL
  Filled 2018-06-18 (×2): qty 5

## 2018-06-18 NOTE — Progress Notes (Signed)
[facility] - [place] at [facility]   PATIENT NAME: Samuel LabsMarcos [name]    MR#:  [id number]  DATE OF BIRTH:  08/28/1970  SUBJECTIVE:  CHIEF COMPLAINT:   Chief Complaint  Patient presents with  . Abdominal Pain   Abd pain better.  No vomiting  In withdrawals.  Spanish interpreter at bedside  REVIEW OF SYSTEMS:    Review of Systems  Constitutional: Positive for malaise/fatigue. Negative for chills and fever.  HENT: Negative for sore throat.   Eyes: Negative for blurred vision, double vision and pain.  Respiratory: Negative for cough, hemoptysis, shortness of breath and wheezing.   Cardiovascular: Negative for chest pain, palpitations, orthopnea and leg swelling.  Gastrointestinal: Positive for abdominal pain and nausea. Negative for constipation, diarrhea, heartburn and vomiting.  Genitourinary: Negative for dysuria and hematuria.  Musculoskeletal: Negative for back pain and joint pain.  Skin: Negative for rash.  Neurological: Negative for sensory change, speech change, focal weakness and headaches.  Endo/Heme/Allergies: Does not bruise/bleed easily.  Psychiatric/Behavioral: Negative for depression. The patient is not nervous/anxious.     DRUG ALLERGIES:  No Known Allergies  VITALS:  Blood pressure (!) 144/93, pulse 98, temperature 98.6 F (37 C), temperature source Oral, resp. rate 18, height 5\' 5"  (1.651 m), weight 68 kg, SpO2 98 %.  PHYSICAL EXAMINATION:   Physical Exam  GENERAL:  48 y.o.-year-old patient lying in the bed with no acute distress.  EYES: Pupils equal, round, reactive to light and accommodation. No scleral icterus. Extraocular muscles intact.  HEENT: Head atraumatic, normocephalic. Oropharynx and nasopharynx clear.  NECK:  Supple, no jugular venous distention. No thyroid enlargement, no tenderness.  LUNGS: Normal breath sounds bilaterally, no wheezing, rales, rhonchi. No use of accessory muscles of respiration.  CARDIOVASCULAR: S1, S2  normal. No murmurs, rubs, or gallops.  ABDOMEN: Soft, epigastric tenderness, nondistended. Bowel sounds present. No organomegaly or mass.  EXTREMITIES: No cyanosis, clubbing or edema b/l.    NEUROLOGIC: Cranial nerves II through XII are intact. No focal Motor or sensory deficits b/l.   PSYCHIATRIC: The patient is alert and oriented x 3.  SKIN: No obvious rash, lesion, or ulcer.   LABORATORY PANEL:   CBC Recent [name]  Lab 06/18/18 0354  WBC 3.2*  HGB 12.5*  HCT 37.0*  PLT 47*   ------------------------------------------------------------------------------------------------------------------ Chemistries  Recent [name]  Lab 06/18/18 0354  NA 134*  K 3.8  CL 93*  CO2 26  GLUCOSE 77  BUN 6  CREATININE 0.60*  CALCIUM 8.0*  MG 1.8  AST 246*  ALT 129*  ALKPHOS 94  BILITOT 1.9*   ------------------------------------------------------------------------------------------------------------------  Cardiac Enzymes No results for input(s): TROPONINI in the last 168 hours. ------------------------------------------------------------------------------------------------------------------  RADIOLOGY:  Ct Abdomen Pelvis W Contrast  Result Date: 06/17/2018 CLINICAL DATA:  Abdominal pain and vomiting for 1 week. EXAM: CT ABDOMEN AND PELVIS WITH CONTRAST TECHNIQUE: Multidetector CT imaging of the abdomen and pelvis was performed using the standard protocol following bolus administration of intravenous contrast. CONTRAST:  100mL OMNIPAQUE IOHEXOL 300 MG/ML  SOLN COMPARISON:  08/14/2015 abdominal radiographs. FINDINGS: Lower chest: No significant pulmonary nodules or acute consolidative airspace disease. Calcified left upper lobe granuloma. Granulomatous calcified left hilar nodes. Hepatobiliary: Diffuse hepatic steatosis. No definite liver surface irregularity. Normal liver size. No liver masses. Normal gallbladder with no radiopaque cholelithiasis. No biliary ductal dilatation. Pancreas: Normal,  with no mass or duct dilation. Spleen: Normal size. No mass. Adrenals/Urinary Tract: Normal adrenals. Normal kidneys with no hydronephrosis and no renal mass. Mildly  distended bladder with mild diffuse bladder wall thickening. Stomach/Bowel: Normal non-distended stomach. Normal caliber small bowel with no small bowel wall thickening. Normal appendix. Normal large bowel with no diverticulosis, large bowel wall thickening or pericolonic fat stranding. Vascular/Lymphatic: Normal caliber abdominal aorta. Patent portal, splenic, hepatic and renal veins. No pathologically enlarged lymph nodes in the abdomen or pelvis. Reproductive: Normal size prostate. Other: No pneumoperitoneum, ascites or focal fluid collection. Musculoskeletal: No aggressive appearing focal osseous lesions. IMPRESSION: 1. No acute abnormality. No evidence of bowel obstruction or acute bowel inflammation. No CT findings of acute pancreatitis. 2. Diffuse hepatic steatosis. 3. Nonspecific mild diffuse bladder wall thickening. Correlation with urinalysis suggested. Electronically Signed   By: Delbert Phenix M.D.   On: 06/17/2018 10:54   ASSESSMENT AND PLAN:   Patient is a 48 year old Spanish-speaking male diagnosed with acute pancreatitis clinically.  Alcohol induced.  *  Alcohol induced pancreatitis Patient presented initially intoxicated with alcohol level of 301.  Pain meds PRN Nausea - PRN Zofran  *  Alcohol abuse and withdrawals Counseling to quit.   CIWA protocol  * Elevated liver enzymes secondary to alcohol abuse.   Follow CMP daily  * Thrombocytopenia with no bleeding Due to alcohol.  DVT prophylaxis; SCDs No heparin products for now due to thrombocytopenia.  All the records are reviewed and case discussed with Care Management/Social Worker Management plans discussed with the patient, family and they are in agreement.  CODE STATUS: FULL CODE  TOTAL TIME TAKING CARE OF THIS PATIENT: 35 minutes.   POSSIBLE D/C IN  1-2 DAYS, DEPENDING ON CLINICAL CONDITION.  Molinda Bailiff Cyndel Griffey M.D on 06/18/2018 at 10:31 AM  Between 7am to 6pm - Pager - [phone]  After 6pm go to [email or web address] - password [email or web address]  [facility] [place] [facility]  Office  [phone]  CC: Primary care physician; Patient, No Pcp Per  Note: This dictation was prepared with Dragon dictation along with smaller phrase technology. Any transcriptional errors that result from this process are unintentional.

## 2018-06-18 NOTE — Progress Notes (Signed)
Dr Elpidio Anis aware that pts HR up 125s-140s and pt is fidgety, new ativan orders placed

## 2018-06-18 NOTE — Progress Notes (Signed)
Pt very agitated, pulling out IVs, refuses to leave telemetry on, restless, impulsive, wanting to leave, confused, Dr Elpidio Anis made aware, new order to discontinue tele because pt refuses to leave it on, librium ordered, ativan adjusted and 1:1 sitter ordered, mitts placed

## 2018-06-19 DIAGNOSIS — F10231 Alcohol dependence with withdrawal delirium: Secondary | ICD-10-CM

## 2018-06-19 LAB — COMPREHENSIVE METABOLIC PANEL
ALT: 140 U/L — ABNORMAL HIGH (ref 0–44)
AST: 240 U/L — ABNORMAL HIGH (ref 15–41)
Albumin: 4.6 g/dL (ref 3.5–5.0)
Alkaline Phosphatase: 117 U/L (ref 38–126)
Anion gap: 11 (ref 5–15)
BUN: <5 mg/dL — ABNORMAL LOW (ref 6–20)
CO2: 28 mmol/L (ref 22–32)
Calcium: 9.2 mg/dL (ref 8.9–10.3)
Chloride: 99 mmol/L (ref 98–111)
Creatinine, Ser: 0.63 mg/dL (ref 0.61–1.24)
GFR calc Af Amer: >60 mL/min (ref >60)
GFR calc non Af Amer: >60 mL/min (ref >60)
Glucose, Bld: 149 mg/dL — ABNORMAL HIGH (ref 70–99)
Potassium: 2.8 mmol/L — ABNORMAL LOW (ref 3.5–5.1)
Sodium: 138 mmol/L (ref 135–145)
Total Bilirubin: 1.5 mg/dL — ABNORMAL HIGH (ref 0.3–1.2)
Total Protein: 7.9 g/dL (ref 6.5–8.1)

## 2018-06-19 LAB — CBC WITH DIFFERENTIAL/PLATELET
Abs Immature Granulocytes: 0.01 10*3/uL (ref 0.00–0.07)
Basophils Absolute: 0 10*3/uL (ref 0.0–0.1)
Basophils Relative: 0 %
Eosinophils Absolute: 0 10*3/uL (ref 0.0–0.5)
Eosinophils Relative: 1 %
HCT: 40.5 % (ref 39.0–52.0)
Hemoglobin: 13.8 g/dL (ref 13.0–17.0)
Immature Granulocytes: 0 %
Lymphocytes Relative: 10 %
Lymphs Abs: 0.4 10*3/uL — ABNORMAL LOW (ref 0.7–4.0)
MCH: 31.2 pg (ref 26.0–34.0)
MCHC: 34.1 g/dL (ref 30.0–36.0)
MCV: 91.4 fL (ref 80.0–100.0)
Monocytes Absolute: 0.4 10*3/uL (ref 0.1–1.0)
Monocytes Relative: 11 %
Neutro Abs: 3.1 10*3/uL (ref 1.7–7.7)
Neutrophils Relative %: 78 %
Platelets: 52 10*3/uL — ABNORMAL LOW (ref 150–400)
RBC: 4.43 MIL/uL (ref 4.22–5.81)
RDW: 14.8 % (ref 11.5–15.5)
WBC: 3.9 10*3/uL — ABNORMAL LOW (ref 4.0–10.5)
nRBC: 0 % (ref 0.0–0.2)

## 2018-06-19 LAB — HIV ANTIBODY (ROUTINE TESTING W REFLEX): HIV Screen 4th Generation wRfx: NONREACTIVE

## 2018-06-19 LAB — HEPATITIS PANEL, ACUTE
HCV Ab: <0.1 s/co ratio (ref 0.0–0.9)
Hep A IgM: NEGATIVE
Hep B C IgM: NEGATIVE
Hepatitis B Surface Ag: NEGATIVE

## 2018-06-19 LAB — MAGNESIUM: Magnesium: 1.7 mg/dL (ref 1.7–2.4)

## 2018-06-19 MED ORDER — POTASSIUM CHLORIDE CRYS ER 20 MEQ PO TBCR
40.0000 meq | EXTENDED_RELEASE_TABLET | Freq: Once | ORAL | Status: DC
  Filled 2018-06-19: qty 2

## 2018-06-19 MED ORDER — CHLORDIAZEPOXIDE HCL 10 MG PO CAPS
50.0000 mg | ORAL_CAPSULE | Freq: Three times a day (TID) | ORAL | Status: DC
  Administered 2018-06-19 – 2018-06-21 (×6): 50 mg via ORAL
  Filled 2018-06-19: qty 10
  Filled 2018-06-19 (×3): qty 5
  Filled 2018-06-19: qty 10
  Filled 2018-06-19: qty 5

## 2018-06-19 MED ORDER — QUETIAPINE FUMARATE 25 MG PO TABS
25.0000 mg | ORAL_TABLET | Freq: Once | ORAL | Status: DC
  Filled 2018-06-19: qty 1

## 2018-06-19 MED ORDER — ZIPRASIDONE MESYLATE 20 MG IM SOLR
10.0000 mg | Freq: Once | INTRAMUSCULAR | Status: DC
  Filled 2018-06-19: qty 20

## 2018-06-19 MED ORDER — POTASSIUM CHLORIDE 10 MEQ/100ML IV SOLN
10.0000 meq | INTRAVENOUS | Status: AC
  Administered 2018-06-19 (×4): 10 meq via INTRAVENOUS
  Filled 2018-06-19 (×3): qty 100

## 2018-06-19 MED ORDER — OLANZAPINE 10 MG IM SOLR
10.0000 mg | Freq: Once | INTRAMUSCULAR | Status: AC
  Administered 2018-06-19: 03:00:00 10 mg via INTRAMUSCULAR
  Filled 2018-06-19: qty 10

## 2018-06-19 MED ORDER — DEXMEDETOMIDINE HCL IN NACL 400 MCG/100ML IV SOLN
0.4000 ug/kg/h | INTRAVENOUS | Status: DC
  Administered 2018-06-19: 0.4 ug/kg/h via INTRAVENOUS
  Administered 2018-06-20: 0.8 ug/kg/h via INTRAVENOUS
  Administered 2018-06-20: 02:00:00 0.5 ug/kg/h via INTRAVENOUS
  Administered 2018-06-21: 02:00:00 0.8 ug/kg/h via INTRAVENOUS
  Administered 2018-06-21: 0.9 ug/kg/h via INTRAVENOUS
  Administered 2018-06-21: 0.6 ug/kg/h via INTRAVENOUS
  Administered 2018-06-21: 12:00:00 0.5 ug/kg/h via INTRAVENOUS
  Administered 2018-06-21: 06:00:00 0.8 ug/kg/h via INTRAVENOUS
  Filled 2018-06-19 (×5): qty 100

## 2018-06-19 MED ORDER — HALOPERIDOL LACTATE 5 MG/ML IJ SOLN
2.0000 mg | Freq: Four times a day (QID) | INTRAMUSCULAR | Status: DC | PRN
  Administered 2018-06-19 – 2018-06-23 (×4): 2 mg via INTRAVENOUS
  Filled 2018-06-19: qty 1
  Filled 2018-06-19 (×3): qty 0.4
  Filled 2018-06-19: qty 1

## 2018-06-19 MED ORDER — SODIUM CHLORIDE 0.9 % IV SOLN
INTRAVENOUS | Status: DC | PRN
  Administered 2018-06-19: 250 mL via INTRAVENOUS

## 2018-06-19 NOTE — Progress Notes (Signed)
Minimal relief with 4mg  of ativan

## 2018-06-19 NOTE — Progress Notes (Signed)
Pt has remained restless and agitated throughout shift, ativan minimally effective, pt pulling/hitting at 1:1 sitter at times, impulsive, sweating, MD made aware of this, MD to add haldol and states that if no improvement with haldol pt may need stepdown

## 2018-06-19 NOTE — Progress Notes (Signed)
Pt transferred to CCU, bedside report given to rebecca

## 2018-06-19 NOTE — Progress Notes (Signed)
[facility] - [place] at [facility]   PATIENT NAME: Samuel LabsMarcos [name]    MR#:  [id number]  DATE OF BIRTH:  1970/01/27  SUBJECTIVE:  CHIEF COMPLAINT:   Chief Complaint  Patient presents with  . Abdominal Pain   Restless.  Agitated overnight.  Received Zyprexa, Benadryl.  In withdrawals.  Sitter at bedside.  Trying to get out of bed.  Spanish interpreter at bedside  REVIEW OF SYSTEMS:    Review of Systems  Constitutional: Positive for malaise/fatigue. Negative for chills and fever.  HENT: Negative for sore throat.   Eyes: Negative for blurred vision, double vision and pain.  Respiratory: Negative for cough, hemoptysis, shortness of breath and wheezing.   Cardiovascular: Negative for chest pain, palpitations, orthopnea and leg swelling.  Gastrointestinal: Positive for abdominal pain and nausea. Negative for constipation, diarrhea, heartburn and vomiting.  Genitourinary: Negative for dysuria and hematuria.  Musculoskeletal: Negative for back pain and joint pain.  Skin: Negative for rash.  Neurological: Negative for sensory change, speech change, focal weakness and headaches.  Endo/Heme/Allergies: Does not bruise/bleed easily.  Psychiatric/Behavioral: Negative for depression. The patient is not nervous/anxious.     DRUG ALLERGIES:  No Known Allergies  VITALS:  Blood pressure (!) 150/99, pulse (!) 106, temperature 98.5 F (36.9 C), temperature source Oral, resp. rate 18, height 5\' 5"  (1.651 m), weight 68 kg, SpO2 98 %.  PHYSICAL EXAMINATION:   Physical Exam  GENERAL:  48 y.o.-year-old patient lying in the bed. EYES: Pupils equal, round, reactive to light and accommodation. No scleral icterus. Extraocular muscles intact.  HEENT: Head atraumatic, normocephalic. Oropharynx and nasopharynx clear.  NECK:  Supple, no jugular venous distention. No thyroid enlargement, no tenderness.  LUNGS: Normal breath sounds bilaterally, no wheezing, rales, rhonchi. No use of  accessory muscles of respiration.  CARDIOVASCULAR: S1, S2 normal. No murmurs, rubs, or gallops.  ABDOMEN: Soft, epigastric tenderness, nondistended. Bowel sounds present. No organomegaly or mass.  EXTREMITIES: No cyanosis, clubbing or edema b/l.    NEUROLOGIC: Not following instructions.  Moves all 4 extremities. PSYCHIATRIC: The patient is agitated and confused SKIN: No obvious rash, lesion, or ulcer.   LABORATORY PANEL:   CBC Recent [name]  Lab 06/19/18 0443  WBC 3.9*  HGB 13.8  HCT 40.5  PLT 52*   ------------------------------------------------------------------------------------------------------------------ Chemistries  Recent [name]  Lab 06/19/18 0443  NA 138  K 2.8*  CL 99  CO2 28  GLUCOSE 149*  BUN <5*  CREATININE 0.63  CALCIUM 9.2  MG 1.7  AST 240*  ALT 140*  ALKPHOS 117  BILITOT 1.5*   ------------------------------------------------------------------------------------------------------------------  Cardiac Enzymes No results for input(s): TROPONINI in the last 168 hours. ------------------------------------------------------------------------------------------------------------------  RADIOLOGY:  Ct Abdomen Pelvis W Contrast  Result Date: 06/17/2018 CLINICAL DATA:  Abdominal pain and vomiting for 1 week. EXAM: CT ABDOMEN AND PELVIS WITH CONTRAST TECHNIQUE: Multidetector CT imaging of the abdomen and pelvis was performed using the standard protocol following bolus administration of intravenous contrast. CONTRAST:  100mL OMNIPAQUE IOHEXOL 300 MG/ML  SOLN COMPARISON:  08/14/2015 abdominal radiographs. FINDINGS: Lower chest: No significant pulmonary nodules or acute consolidative airspace disease. Calcified left upper lobe granuloma. Granulomatous calcified left hilar nodes. Hepatobiliary: Diffuse hepatic steatosis. No definite liver surface irregularity. Normal liver size. No liver masses. Normal gallbladder with no radiopaque cholelithiasis. No biliary ductal  dilatation. Pancreas: Normal, with no mass or duct dilation. Spleen: Normal size. No mass. Adrenals/Urinary Tract: Normal adrenals. Normal kidneys with no hydronephrosis and no renal mass. Mildly distended bladder  with mild diffuse bladder wall thickening. Stomach/Bowel: Normal non-distended stomach. Normal caliber small bowel with no small bowel wall thickening. Normal appendix. Normal large bowel with no diverticulosis, large bowel wall thickening or pericolonic fat stranding. Vascular/Lymphatic: Normal caliber abdominal aorta. Patent portal, splenic, hepatic and renal veins. No pathologically enlarged lymph nodes in the abdomen or pelvis. Reproductive: Normal size prostate. Other: No pneumoperitoneum, ascites or focal fluid collection. Musculoskeletal: No aggressive appearing focal osseous lesions. IMPRESSION: 1. No acute abnormality. No evidence of bowel obstruction or acute bowel inflammation. No CT findings of acute pancreatitis. 2. Diffuse hepatic steatosis. 3. Nonspecific mild diffuse bladder wall thickening. Correlation with urinalysis suggested. Electronically Signed   By: Delbert Phenix M.D.   On: 06/17/2018 10:54   ASSESSMENT AND PLAN:   Patient is a 48 year old Spanish-speaking male diagnosed with acute pancreatitis clinically.  Alcohol induced.  *Delirium tremens Patient on Librium 25 3 times daily.  Will increase to 50 mg 3 times daily.  Stat dose of IV Ativan now.  Discussed with nursing staff.  Continue sitter at bedside. If any worsening will need to move to ICU.  *  Alcohol induced pancreatitis Improved.  No tenderness on abdominal exam.  * Elevated liver enzymes secondary to alcohol abuse.   No worsening today.  * Thrombocytopenia with no bleeding Due to alcohol.  *Hypokalemia.  Will replace orally and IV.  Check magnesium level.  DVT prophylaxis; SCDs No heparin products for now due to thrombocytopenia.  All the records are reviewed and case discussed with Care  Management/Social Worker Management plans discussed with the patient, family and they are in agreement.  CODE STATUS: FULL CODE  TOTAL CRITICAL CARE TIME TAKING CARE OF THIS PATIENT: 35 minutes.   POSSIBLE D/C IN 1-2 DAYS, DEPENDING ON CLINICAL CONDITION.  Molinda Bailiff Batsheva Stevick M.D on 06/19/2018 at 8:29 AM  Between 7am to 6pm - Pager - [phone]  After 6pm go to [email or web address] - password [email or web address]  [facility] [place] [facility]  Office  [phone]  CC: Primary care physician; Patient, No Pcp Per  Note: This dictation was prepared with Dragon dictation along with smaller phrase technology. Any transcriptional errors that result from this process are unintentional.

## 2018-06-19 NOTE — Progress Notes (Addendum)
Pt admitted to stepdown room 11 via 1 RN and 1 NT.  Pt lateral transfer from floor bed to ICU bed with 4 person assist and tolerated movement well.  Pt attached to monitors, started precedex gtt per MD orders.   Pt confused, attempting to get out of bed.  Pt in bed, bed locked and in low position.  Will continue to monitor.  Pt belongings including clothing, wallet, and cell phone at bedside.

## 2018-06-19 NOTE — Progress Notes (Signed)
   CHIEF COMPLAINT:   Acute DT's  Subjective  Admitted for severe DT's Placed on precedex Was given ativan,benedryl, haldol on gen med floor Patient needs precedex   ROS unobtainable due to lethargy and DT's  Objective   Examination:  General exam: critically ill appearing  on precedex Respiratory system: Clear to auscultation. Respiratory effort normal. HEENT: [place]/AT, PERRLA, no thrush, no stridor. Cardiovascular system: S1 & S2 heard, RRR. No JVD, murmurs, rubs, gallops or clicks. No pedal edema. Gastrointestinal system: Abdomen is nondistended, soft and nontender. No organomegaly or masses felt. Normal bowel sounds heard. Central nervous system: lethargic Extremities: no focal weakness Skin: No rashes, lesions or ulcers Psychiatry: poor Judgement and poor insight   VITALS:  height is 5\' 5"  (1.651 m) and weight is 68 kg. His oral temperature is 98.5 F (36.9 C). His blood pressure is 133/97 (abnormal) and his pulse is 138 (abnormal). His respiration is 18 and oxygen saturation is 95%.   I personally reviewed Labs under Results section.   Assessment/Plan:  SEVERE ALCOHOL WITHDRAWAL -Therapy with Thiamine and MVI -CIWA Protocol -Precedex as needed -High risk for intubation  -high risk for aspiration   ELECTROLYTES -follow labs as needed -replace as needed -pharmacy consultation and following    DVT/GI PRX ordered TRANSFUSIONS AS NEEDED MONITOR FSBS ASSESS the need for LABS as needed     Critical Care Time devoted to patient care services described in this note is 34 minutes.   Overall, patient is critically ill, prognosis is guarded.    Lucie Leather, M.D.  [facility]  Medical Director [facility] Medical Director [facility] Cardio-Pulmonary Department

## 2018-06-19 NOTE — Progress Notes (Signed)
Dr Elpidio Anis messaged, made aware that haldol ineffective, pt remains restless, impulsive, hitting at tech and 4mg  dose of ativan given, also aware that HR 138

## 2018-06-20 LAB — COMPREHENSIVE METABOLIC PANEL
ALT: 154 U/L — ABNORMAL HIGH (ref 0–44)
AST: 246 U/L — ABNORMAL HIGH (ref 15–41)
Albumin: 4 g/dL (ref 3.5–5.0)
Alkaline Phosphatase: 85 U/L (ref 38–126)
Anion gap: 9 (ref 5–15)
BUN: <5 mg/dL — ABNORMAL LOW (ref 6–20)
CO2: 23 mmol/L (ref 22–32)
Calcium: 8.5 mg/dL — ABNORMAL LOW (ref 8.9–10.3)
Chloride: 108 mmol/L (ref 98–111)
Creatinine, Ser: 0.6 mg/dL — ABNORMAL LOW (ref 0.61–1.24)
GFR calc Af Amer: >60 mL/min (ref >60)
GFR calc non Af Amer: >60 mL/min (ref >60)
Glucose, Bld: 119 mg/dL — ABNORMAL HIGH (ref 70–99)
Potassium: 2.8 mmol/L — ABNORMAL LOW (ref 3.5–5.1)
Sodium: 140 mmol/L (ref 135–145)
Total Bilirubin: 1.8 mg/dL — ABNORMAL HIGH (ref 0.3–1.2)
Total Protein: 6.9 g/dL (ref 6.5–8.1)

## 2018-06-20 LAB — CBC
HCT: 37.2 % — ABNORMAL LOW (ref 39.0–52.0)
Hemoglobin: 12.7 g/dL — ABNORMAL LOW (ref 13.0–17.0)
MCH: 31.1 pg (ref 26.0–34.0)
MCHC: 34.1 g/dL (ref 30.0–36.0)
MCV: 91.2 fL (ref 80.0–100.0)
Platelets: 57 10*3/uL — ABNORMAL LOW (ref 150–400)
RBC: 4.08 MIL/uL — ABNORMAL LOW (ref 4.22–5.81)
RDW: 15.6 % — ABNORMAL HIGH (ref 11.5–15.5)
WBC: 3.7 10*3/uL — ABNORMAL LOW (ref 4.0–10.5)
nRBC: 0 % (ref 0.0–0.2)

## 2018-06-20 LAB — GLUCOSE, CAPILLARY: Glucose-Capillary: 96 mg/dL (ref 70–99)

## 2018-06-20 LAB — MAGNESIUM: Magnesium: 2 mg/dL (ref 1.7–2.4)

## 2018-06-20 LAB — POTASSIUM: Potassium: 3.6 mmol/L (ref 3.5–5.1)

## 2018-06-20 LAB — PHOSPHORUS: Phosphorus: 2.6 mg/dL (ref 2.5–4.6)

## 2018-06-20 MED ORDER — POTASSIUM CHLORIDE 2 MEQ/ML IV SOLN
INTRAVENOUS | Status: DC
  Administered 2018-06-20: 09:00:00 via INTRAVENOUS
  Filled 2018-06-20 (×2): qty 1000

## 2018-06-20 MED ORDER — LORAZEPAM 2 MG/ML IJ SOLN
2.0000 mg | INTRAMUSCULAR | Status: DC | PRN
  Administered 2018-06-20: 23:00:00 3 mg via INTRAVENOUS
  Administered 2018-06-20 – 2018-06-21 (×2): 2 mg via INTRAVENOUS
  Administered 2018-06-22: 08:00:00 3 mg via INTRAVENOUS
  Filled 2018-06-20: qty 2
  Filled 2018-06-20: qty 1
  Filled 2018-06-20: qty 2

## 2018-06-20 MED ORDER — POTASSIUM CHLORIDE 2 MEQ/ML IV SOLN
INTRAVENOUS | Status: DC
  Filled 2018-06-20 (×2): qty 1000

## 2018-06-20 MED ORDER — POTASSIUM CHLORIDE 2 MEQ/ML IV SOLN
INTRAVENOUS | Status: DC
  Administered 2018-06-20 – 2018-06-21 (×3): via INTRAVENOUS
  Filled 2018-06-20 (×4): qty 1000

## 2018-06-20 MED ORDER — POTASSIUM CHLORIDE 10 MEQ/100ML IV SOLN
10.0000 meq | INTRAVENOUS | Status: DC
  Administered 2018-06-20 (×3): 10 meq via INTRAVENOUS
  Filled 2018-06-20 (×5): qty 100

## 2018-06-20 NOTE — Progress Notes (Addendum)
[facility] - [place] at [facility]   PATIENT NAME: Samuel [name]    MR#:  [id number]  DATE OF BIRTH:  08-Dec-1970  SUBJECTIVE:  CHIEF COMPLAINT:   Chief Complaint  Patient presents with  . Abdominal Pain   No new complaint. Patient requiring precedex for alcohol withdrawal Awake and alert.  REVIEW OF SYSTEMS:  ROS Review of Systems  Constitutional: Positive for malaise. Negative for chills and fever.  HENT: Negative for sore throat.   Eyes: Negative for blurred vision, double vision and pain.  Respiratory: Negative for cough, hemoptysis, shortness of breath and wheezing.   Cardiovascular: Negative for chest pain, palpitations, orthopnea and leg swelling.  Gastrointestinal: Positive for nausea. Negative for constipation, diarrhea, heartburn and vomiting.  Genitourinary: Negative for dysuria and hematuria.  Musculoskeletal: Negative for back pain and joint pain.  Skin: Negative for rash.  Neurological: Negative for sensory change, speech change, focal weakness and headaches.  Endo/Heme/Allergies: Does not bruise/bleed easily.  Psychiatric/Behavioral: Negative for depression. The patient is not nervous/anxious.    DRUG ALLERGIES:  No Known Allergies VITALS:  Blood pressure 109/86, pulse 91, temperature 98.6 F (37 C), temperature source Axillary, resp. rate 20, height 5\' 5"  (1.651 m), weight 68 kg, SpO2 97 %. PHYSICAL EXAMINATION:  Physical Exam  GENERAL:  48 y.o.-year-old patient lying in the bed. EYES: Pupils equal, round, reactive to light and accommodation. No scleral icterus. Extraocular muscles intact.  HEENT: Head atraumatic, normocephalic. Oropharynx and nasopharynx clear.  NECK:  Supple, no jugular venous distention. No thyroid enlargement, no tenderness.  LUNGS: Normal breath sounds bilaterally, no wheezing, rales, rhonchi. No use of accessory muscles of respiration.  CARDIOVASCULAR: S1, S2 normal. No murmurs, rubs, or gallops.  ABDOMEN:  Soft, epigastric tenderness, nondistended. Bowel sounds present. No organomegaly or mass.  EXTREMITIES: No cyanosis, clubbing or edema b/l.    NEUROLOGIC: Not following instructions.  Moves all 4 extremities. PSYCHIATRIC: No agitation today.Awake and alert SKIN: No obvious rash, lesion, or ulcer.   LABORATORY PANEL:  Male CBC Samuel [name]  Lab 06/20/18 0427  WBC 3.7*  HGB 12.7*  HCT 37.2*  PLT 57*   ------------------------------------------------------------------------------------------------------------------ Chemistries  Samuel [name]  Lab 06/20/18 0427 06/20/18 1315  NA 140  --   K 2.8* 3.6  CL 108  --   CO2 23  --   GLUCOSE 119*  --   BUN <5*  --   CREATININE 0.60*  --   CALCIUM 8.5*  --   MG 2.0  --   AST 246*  --   ALT 154*  --   ALKPHOS 85  --   BILITOT 1.8*  --    RADIOLOGY:  No results found. ASSESSMENT AND PLAN:     Patient is a 48 year old Spanish-speaking male diagnosed with acute pancreatitis clinically. Alcohol induced.  *Alcohol induced pancreatitis Patient presented initially intoxicated with alcohol level of 301.  Pain meds PRN Nausea - PRN Zofran  *Alcohol abuse and withdrawals Counseling to quit.  CIWA protocol Patient was transferred to ICU and started on precedex  *Elevated liver enzymes secondary to alcohol abuse.  Follow CMP daily  *Thrombocytopenia with no bleeding Due to alcohol.  Hypokalemia; Replaced by ICU team  DVT prophylaxis;SCDs No heparin products for now due to thrombocytopenia.  All the records are reviewed and case discussed with Care    All the records are reviewed and case discussed with Care Management/Social Worker. Management plans discussed with the patient, family and they are in agreement.  CODE STATUS: Full Code  TOTAL TIME TAKING CARE OF THIS PATIENT: 35 minutes.   More than 50% of the time was spent in counseling/coordination of care: YES  POSSIBLE D/C IN 3 DAYS, DEPENDING ON  CLINICAL CONDITION.   Samuel [name] M.D on 06/20/2018 at 4:05 PM  Between 7am to 6pm - Pager - [phone]  After 6pm go to [email or web address] - Social research officer, governmentpassword [email or web address]  [facility] [place] [facility]  Office  [phone]  CC: Primary care physician; Patient, No Pcp Per  Note: This dictation was prepared with Dragon dictation along with smaller phrase technology. Any transcriptional errors that result from this process are unintentional.

## 2018-06-20 NOTE — Progress Notes (Addendum)
Pharmacy Electrolyte Monitoring Consult:  Pharmacy consulted to assist in monitoring and replacing electrolytes in this 48 y.o. male admitted on 06/17/2018 with Abdominal Pain   Labs:  Sodium (mmol/L)  Date Value  06/20/2018 140   Potassium (mmol/L)  Date Value  06/20/2018 3.6   Magnesium (mg/dL)  Date Value  07/68/0881 2.0   Phosphorus (mg/dL)  Date Value  11/17/5943 2.6   Calcium (mg/dL)  Date Value  85/92/9244 8.5 (L)   Albumin (g/dL)  Date Value  62/86/3817 4.0    Assessment/Plan: --This morning the patient received KCl 10 mEq IV x 3: potassium has improved from 2.8 to 3.4 mmol/L --patient is currently receiving LR40 at 172mL/hr: continue with LR20 at 100 mL/hr --Will recheck electrolytes, including magnesium and phosphorous d/t high refeeding risk w/ am labs.  Burnis Medin, PharmD Clinical Pharmacist 06/20/2018 2:16 PM

## 2018-06-20 NOTE — Progress Notes (Signed)
   CHIEF COMPLAINT:   Severe DT's  Subjective  Severe DT's On precedex  REVIEW OF SYSTEMS  PATIENT IS UNABLE TO PROVIDE COMPLETE REVIEW OF SYSTEM S DUE TO SEVERE CRITICAL ILLNESS AND ENCEPHALOPATHY     Objective   PHYSICAL EXAMINATION:  GENERAL:critically ill appearing,  HEAD: Normocephalic, atraumatic.  EYES: Pupils equal, round, reactive to light.  No scleral icterus.  MOUTH: Moist mucosal membrane. NECK: Supple. No thyromegaly. No nodules. No JVD.  PULMONARY: +rhonchi,  CARDIOVASCULAR: S1 and S2. Regular rate and rhythm. No murmurs, rubs, or gallops.  GASTROINTESTINAL: Soft, nontender, -distended. No masses. Positive bowel sounds. No hepatosplenomegaly.  MUSCULOSKELETAL: No swelling, clubbing, or edema.  NEUROLOGIC: lethargic on precedex SKIN:intact,warm,dry    VITALS:  height is 5\' 5"  (1.651 m) and weight is 68 kg. His axillary temperature is 98.1 F (36.7 C). His blood pressure is 139/104 (abnormal) and his pulse is 75. His respiration is 13 and oxygen saturation is 97%.   I personally reviewed Labs under Results section.     Assessment/Plan:  SEVERE ALCOHOL WITHDRAWAL -Therapy with Thiamine and MVI -CIWA Protocol -Precedex as needed -High risk for intubation  -high risk for aspiration Wean off precedex as tolerated   ELECTROLYTES -follow labs as needed -replace as needed -pharmacy consultation and following    DVT/GI PRX ordered TRANSFUSIONS AS NEEDED MONITOR FSBS ASSESS the need for LABS as needed   Critical Care Time devoted to patient care services described in this note is 31 minutes.   Overall, patient is critically ill, prognosis is guarded.   Lucie Leather, M.D.  [facility]  Medical Director [facility] Medical Director [facility] Cardio-Pulmonary Department

## 2018-06-20 NOTE — Progress Notes (Signed)
Pharmacy Electrolyte Monitoring Consult:  Pharmacy consulted to assist in monitoring and replacing electrolytes in this 48 y.o. male admitted on 06/17/2018 with Abdominal Pain   Labs:  Sodium (mmol/L)  Date Value  06/20/2018 140   Potassium (mmol/L)  Date Value  06/20/2018 2.8 (L)   Magnesium (mg/dL)  Date Value  62/69/4854 1.7   Phosphorus (mg/dL)  Date Value  62/70/3500 2.5   Calcium (mg/dL)  Date Value  93/81/8299 8.5 (L)   Albumin (g/dL)  Date Value  37/16/9678 4.0    Assessment/Plan: Patient has been ordered KCl 10 mEq IV x 5 + KCl 40 mEq PO x 1 with LR going @ 100 ml/hr. Will recheck electrolytes w/ am labs.  Thomasene Ripple, PharmD, BCPS Clinical Pharmacist 06/20/2018 6:06 AM

## 2018-06-20 NOTE — Progress Notes (Signed)
Patient on precedex during this shift, only alert to self. Vital signs stable and in no distress.

## 2018-06-20 NOTE — Progress Notes (Signed)
Pt is resting in bed, alert and calm but restless at times. Pt is off of precedex gtt and receiving PRN Ativan per CIWA protocol. Pt follows commands at times but remains mostly confused.

## 2018-06-21 LAB — RENAL FUNCTION PANEL
Albumin: 3.7 g/dL (ref 3.5–5.0)
Anion gap: 10 (ref 5–15)
BUN: 7 mg/dL (ref 6–20)
CO2: 22 mmol/L (ref 22–32)
Calcium: 8.9 mg/dL (ref 8.9–10.3)
Chloride: 109 mmol/L (ref 98–111)
Creatinine, Ser: 0.57 mg/dL — ABNORMAL LOW (ref 0.61–1.24)
GFR calc Af Amer: >60 mL/min (ref >60)
GFR calc non Af Amer: >60 mL/min (ref >60)
Glucose, Bld: 139 mg/dL — ABNORMAL HIGH (ref 70–99)
Phosphorus: 3.4 mg/dL (ref 2.5–4.6)
Potassium: 3.2 mmol/L — ABNORMAL LOW (ref 3.5–5.1)
Sodium: 141 mmol/L (ref 135–145)

## 2018-06-21 LAB — CBC WITH DIFFERENTIAL/PLATELET
Abs Immature Granulocytes: 0.01 10*3/uL (ref 0.00–0.07)
Basophils Absolute: 0 10*3/uL (ref 0.0–0.1)
Basophils Relative: 1 %
Eosinophils Absolute: 0.1 10*3/uL (ref 0.0–0.5)
Eosinophils Relative: 3 %
HCT: 37.7 % — ABNORMAL LOW (ref 39.0–52.0)
Hemoglobin: 12.9 g/dL — ABNORMAL LOW (ref 13.0–17.0)
Immature Granulocytes: 0 %
Lymphocytes Relative: 12 %
Lymphs Abs: 0.6 10*3/uL — ABNORMAL LOW (ref 0.7–4.0)
MCH: 31.4 pg (ref 26.0–34.0)
MCHC: 34.2 g/dL (ref 30.0–36.0)
MCV: 91.7 fL (ref 80.0–100.0)
Monocytes Absolute: 0.5 10*3/uL (ref 0.1–1.0)
Monocytes Relative: 12 %
Neutro Abs: 3.3 10*3/uL (ref 1.7–7.7)
Neutrophils Relative %: 72 %
Platelets: 82 10*3/uL — ABNORMAL LOW (ref 150–400)
RBC: 4.11 MIL/uL — ABNORMAL LOW (ref 4.22–5.81)
RDW: 15.7 % — ABNORMAL HIGH (ref 11.5–15.5)
WBC: 4.5 10*3/uL (ref 4.0–10.5)
nRBC: 0 % (ref 0.0–0.2)

## 2018-06-21 LAB — MAGNESIUM: Magnesium: 1.8 mg/dL (ref 1.7–2.4)

## 2018-06-21 LAB — PHOSPHORUS: Phosphorus: 3.4 mg/dL (ref 2.5–4.6)

## 2018-06-21 MED ORDER — MAGNESIUM SULFATE 2 GM/50ML IV SOLN
2.0000 g | Freq: Once | INTRAVENOUS | Status: AC
  Administered 2018-06-21: 2 g via INTRAVENOUS
  Filled 2018-06-21: qty 50

## 2018-06-21 MED ORDER — PANTOPRAZOLE SODIUM 40 MG PO TBEC
40.0000 mg | DELAYED_RELEASE_TABLET | Freq: Every day | ORAL | Status: DC
  Administered 2018-06-22 – 2018-06-25 (×4): 40 mg via ORAL
  Filled 2018-06-21 (×4): qty 1

## 2018-06-21 MED ORDER — DIAZEPAM 5 MG PO TABS
5.0000 mg | ORAL_TABLET | Freq: Four times a day (QID) | ORAL | Status: DC
  Administered 2018-06-21 – 2018-06-22 (×3): 5 mg via ORAL
  Filled 2018-06-21 (×3): qty 1

## 2018-06-21 MED ORDER — POTASSIUM CHLORIDE CRYS ER 20 MEQ PO TBCR
40.0000 meq | EXTENDED_RELEASE_TABLET | Freq: Once | ORAL | Status: AC
  Administered 2018-06-21: 19:00:00 40 meq via ORAL
  Filled 2018-06-21: qty 2

## 2018-06-21 NOTE — Progress Notes (Signed)
Pharmacy Electrolyte Monitoring Consult:  Pharmacy consulted to assist in monitoring and replacing electrolytes in this 48 y.o. male admitted on 06/17/2018 with Abdominal Pain   Labs:  Sodium (mmol/L)  Date Value  06/21/2018 141   Potassium (mmol/L)  Date Value  06/21/2018 3.2 (L)   Magnesium (mg/dL)  Date Value  15/05/6977 1.8   Phosphorus (mg/dL)  Date Value  48/01/6551 3.4  06/21/2018 3.4   Calcium (mg/dL)  Date Value  74/82/7078 8.9   Albumin (g/dL)  Date Value  67/54/4920 3.7    Assessment/Plan: MIVF stopped during ICU rounds.   Will order potassium PO x 1 and magnesium 2g IV X 1.   Will replace for goal potassium ~ 4 and goal magnesium ~ 2.   Pharmacy will continue to monitor and adjust per consult.   Charlies Silvers [facility]  06/21/2018 5:13 PM

## 2018-06-21 NOTE — Progress Notes (Signed)
[employer] - [place] at [facility]   PATIENT NAME: Samuel [name]    MR#:  [id number]  DATE OF BIRTH:  August 12, 1970  SUBJECTIVE:  CHIEF COMPLAINT:   Chief Complaint  Patient presents with  . Abdominal Pain   No new complaint. Patient requiring precedex for alcohol withdrawal Resting comfortably this morning  REVIEW OF SYSTEMS:  ROS Review of Systems  Constitutional: Positive for malaise. Negative for chills and fever.  HENT: Negative for sore throat.   Eyes: Negative for blurred vision, double vision and pain.  Respiratory: Negative for cough, hemoptysis, shortness of breath and wheezing.   Cardiovascular: Negative for chest pain, palpitations, orthopnea and leg swelling.  Gastrointestinal: Positive for nausea. Negative for constipation, diarrhea, heartburn and vomiting.  Genitourinary: Negative for dysuria and hematuria.  Musculoskeletal: Negative for back pain and joint pain.  Skin: Negative for rash.  Neurological: Negative for sensory change, speech change, focal weakness and headaches.  Endo/Heme/Allergies: Does not bruise/bleed easily.  Psychiatric/Behavioral: Negative for depression. The patient is not nervous/anxious.    DRUG ALLERGIES:  No Known Allergies VITALS:  Blood pressure 111/78, pulse 88, temperature 98.7 F (37.1 C), temperature source Axillary, resp. rate 19, height 5\' 5"  (1.651 m), weight 68 kg, SpO2 96 %. PHYSICAL EXAMINATION:  Physical Exam  GENERAL:  48 y.o.-year-old patient lying in the bed. EYES: Pupils equal, round, reactive to light and accommodation. No scleral icterus. Extraocular muscles intact.  HEENT: Head atraumatic, normocephalic. Oropharynx and nasopharynx clear.  NECK:  Supple, no jugular venous distention. No thyroid enlargement, no tenderness.  LUNGS: Normal breath sounds bilaterally, no wheezing, rales, rhonchi. No use of accessory muscles of respiration.  CARDIOVASCULAR: S1, S2 normal. No murmurs, rubs, or  gallops.  ABDOMEN: Soft, epigastric tenderness, nondistended. Bowel sounds present. No organomegaly or mass.  EXTREMITIES: No cyanosis, clubbing or edema b/l.    NEUROLOGIC: Not following instructions.  Moves all 4 extremities. PSYCHIATRIC: No agitation today.Awake and alert SKIN: No obvious rash, lesion, or ulcer.   LABORATORY PANEL:  Male CBC Recent [name]  Lab 06/21/18 0446  WBC 4.5  HGB 12.9*  HCT 37.7*  PLT 82*   ------------------------------------------------------------------------------------------------------------------ Chemistries  Recent [name]  Lab 06/20/18 0427  06/21/18 0446  NA 140  --  141  K 2.8*   < > 3.2*  CL 108  --  109  CO2 23  --  22  GLUCOSE 119*  --  139*  BUN <5*  --  7  CREATININE 0.60*  --  0.57*  CALCIUM 8.5*  --  8.9  MG 2.0  --  1.8  AST 246*  --   --   ALT 154*  --   --   ALKPHOS 85  --   --   BILITOT 1.8*  --   --    < > = values in this interval not displayed.   RADIOLOGY:  No results found. ASSESSMENT AND PLAN:     Patient is a 48 year old Spanish-speaking male diagnosed with acute pancreatitis clinically. Alcohol induced.  *Alcohol induced pancreatitis Patient presented initially intoxicated with alcohol level of 301.  Pain meds PRN Nausea - PRN Zofran  *Alcohol abuse and withdrawals Counseling to quit.  CIWA protocol Patient was transferred to ICU and started on precedex  *Elevated liver enzymes secondary to alcohol abuse.  Follow CMP daily  *Thrombocytopenia with no bleeding Improving. Due to alcohol.  Hypokalemia; Replaced by ICU team  DVT prophylaxis;SCDs No heparin products for now due to thrombocytopenia.  All the records are reviewed and case discussed with Care    All the records are reviewed and case discussed with Care Management/Social Worker. Management plans discussed with the patient, family and they are in agreement.  CODE STATUS: Full Code  TOTAL TIME TAKING CARE OF THIS  PATIENT: 34 minutes.   More than 50% of the time was spent in counseling/coordination of care: YES  POSSIBLE D/C IN 3 DAYS, DEPENDING ON CLINICAL CONDITION.   Aleeah Greeno M.D on 06/21/2018 at 3:11 PM  Between 7am to 6pm - Pager - [phone]  After 6pm go to [email or web address] - Social research officer, governmentpassword EPAS [email or web address]  [employer] [place] [facility]  Office  [phone]  CC: Primary care physician; Patient, No Pcp Per  Note: This dictation was prepared with Dragon dictation along with smaller phrase technology. Any transcriptional errors that result from this process are unintentional.

## 2018-06-21 NOTE — Progress Notes (Signed)
   CHIEF COMPLAINT:   Severe DT's  Subjective  Severe DT's On precedex High risk for intubation    REVIEW OF SYSTEMS  PATIENT IS UNABLE TO PROVIDE COMPLETE REVIEW OF SYSTEM S DUE TO SEVERE CRITICAL ILLNESS AND ENCEPHALOPATHY  Objective  PHYSICAL EXAMINATION:  GENERAL:critically ill appearing, HEAD: Normocephalic, atraumatic.  EYES: Pupils equal, round, reactive to light.  No scleral icterus.  MOUTH: Moist mucosal membrane. NECK: Supple. No thyromegaly. No nodules. No JVD.  PULMONARY: +rhonchi, CARDIOVASCULAR: S1 and S2. Regular rate and rhythm. No murmurs, rubs, or gallops.  GASTROINTESTINAL: Soft, nontender, -distended. No masses. Positive bowel sounds. No hepatosplenomegaly.  MUSCULOSKELETAL: No swelling, clubbing, or edema.  NEUROLOGIC: obtunded SKIN:intact,warm,dry    VITALS:  height is 5\' 5"  (1.651 m) and weight is 68 kg. His axillary temperature is 98.5 F (36.9 C). His blood pressure is 114/82 and his pulse is 71. His respiration is 14 and oxygen saturation is 94%.   I personally reviewed Labs under Results section.  Radiology Reports No results found. CBC    Component Value Date/Time   WBC 4.5 06/21/2018 0446   RBC 4.11 (L) 06/21/2018 0446   HGB 12.9 (L) 06/21/2018 0446   HCT 37.7 (L) 06/21/2018 0446   PLT 82 (L) 06/21/2018 0446   MCV 91.7 06/21/2018 0446   MCH 31.4 06/21/2018 0446   MCHC 34.2 06/21/2018 0446   RDW 15.7 (H) 06/21/2018 0446   LYMPHSABS 0.6 (L) 06/21/2018 0446   MONOABS 0.5 06/21/2018 0446   EOSABS 0.1 06/21/2018 0446   BASOSABS 0.0 06/21/2018 0446   BMP Latest Ref Rng & Units 06/21/2018 06/20/2018 06/20/2018  Glucose 70 - 99 mg/dL 614(E) - 315(Q)  BUN 6 - 20 mg/dL 7 - <0(G)  Creatinine 8.67 - 1.24 mg/dL 6.19(J) - 0.93(O)  Sodium 135 - 145 mmol/L 141 - 140  Potassium 3.5 - 5.1 mmol/L 3.2(L) 3.6 2.8(L)  Chloride 98 - 111 mmol/L 109 - 108  CO2 22 - 32 mmol/L 22 - 23  Calcium 8.9 - 10.3 mg/dL 8.9 - 8.5(L)       Assessment/Plan:    SEVERE ALCOHOL WITHDRAWAL -Therapy with Thiamine and MVI -CIWA Protocol -Precedex as needed -High risk for intubation  -high risk for aspiration   ELECTROLYTES -follow labs as needed -replace as needed -pharmacy consultation and following    DVT/GI PRX ordered TRANSFUSIONS AS NEEDED MONITOR FSBS ASSESS the need for LABS as needed   Critical Care Time devoted to patient care services described in this note is 32 minutes.   Overall, patient is critically ill, prognosis is guarded.    Lucie Leather, M.D.  [facility]  Medical Director [facility] Medical Director [facility] Cardio-Pulmonary Department

## 2018-06-22 LAB — CBC WITH DIFFERENTIAL/PLATELET
Abs Immature Granulocytes: 0.02 10*3/uL (ref 0.00–0.07)
Basophils Absolute: 0.1 10*3/uL (ref 0.0–0.1)
Basophils Relative: 1 %
Eosinophils Absolute: 0.3 10*3/uL (ref 0.0–0.5)
Eosinophils Relative: 8 %
HCT: 39.4 % (ref 39.0–52.0)
Hemoglobin: 13.3 g/dL (ref 13.0–17.0)
Immature Granulocytes: 1 %
Lymphocytes Relative: 17 %
Lymphs Abs: 0.6 10*3/uL — ABNORMAL LOW (ref 0.7–4.0)
MCH: 31.3 pg (ref 26.0–34.0)
MCHC: 33.8 g/dL (ref 30.0–36.0)
MCV: 92.7 fL (ref 80.0–100.0)
Monocytes Absolute: 0.6 10*3/uL (ref 0.1–1.0)
Monocytes Relative: 14 %
Neutro Abs: 2.3 10*3/uL (ref 1.7–7.7)
Neutrophils Relative %: 59 %
Platelets: 117 10*3/uL — ABNORMAL LOW (ref 150–400)
RBC: 4.25 MIL/uL (ref 4.22–5.81)
RDW: 16.1 % — ABNORMAL HIGH (ref 11.5–15.5)
WBC: 3.9 10*3/uL — ABNORMAL LOW (ref 4.0–10.5)
nRBC: 0 % (ref 0.0–0.2)

## 2018-06-22 LAB — COMPREHENSIVE METABOLIC PANEL
ALT: 378 U/L — ABNORMAL HIGH (ref 0–44)
AST: 511 U/L — ABNORMAL HIGH (ref 15–41)
Albumin: 3.8 g/dL (ref 3.5–5.0)
Alkaline Phosphatase: 111 U/L (ref 38–126)
Anion gap: 11 (ref 5–15)
BUN: 8 mg/dL (ref 6–20)
CO2: 22 mmol/L (ref 22–32)
Calcium: 9 mg/dL (ref 8.9–10.3)
Chloride: 107 mmol/L (ref 98–111)
Creatinine, Ser: 0.52 mg/dL — ABNORMAL LOW (ref 0.61–1.24)
GFR calc Af Amer: >60 mL/min (ref >60)
GFR calc non Af Amer: >60 mL/min (ref >60)
Glucose, Bld: 115 mg/dL — ABNORMAL HIGH (ref 70–99)
Potassium: 3.9 mmol/L (ref 3.5–5.1)
Sodium: 140 mmol/L (ref 135–145)
Total Bilirubin: 1.2 mg/dL (ref 0.3–1.2)
Total Protein: 7.1 g/dL (ref 6.5–8.1)

## 2018-06-22 LAB — LIPASE, BLOOD: Lipase: 37 U/L (ref 11–51)

## 2018-06-22 LAB — MAGNESIUM: Magnesium: 2 mg/dL (ref 1.7–2.4)

## 2018-06-22 MED ORDER — DIAZEPAM 5 MG PO TABS
5.0000 mg | ORAL_TABLET | Freq: Four times a day (QID) | ORAL | Status: AC
  Administered 2018-06-22 – 2018-06-23 (×4): 5 mg via ORAL
  Filled 2018-06-22 (×4): qty 1

## 2018-06-22 NOTE — Progress Notes (Signed)
   CHIEF COMPLAINT:   Severe DT's  Subjective  Severe DTs On Precedex  high risk for intubation Check lipase and  liver function tests   REVIEW OF SYSTEMS  PATIENT IS UNABLE TO PROVIDE COMPLETE REVIEW OF SYSTEM S DUE TO SEVERE CRITICAL ILLNESS AND ENCEPHALOPATHY    BP 124/89   Pulse 67   Temp 98.3 F (36.8 C) (Axillary)   Resp 18   Ht 5\' 5"  (1.651 m)   Wt 68 kg   SpO2 92%   BMI 24.96 kg/m    Objective  PHYSICAL EXAMINATION:  GENERAL:critically ill appearing, HEAD: Normocephalic, atraumatic.  EYES: Pupils equal, round, reactive to light.  No scleral icterus.  MOUTH: Moist mucosal membrane. NECK: Supple. No thyromegaly. No nodules. No JVD.  PULMONARY: +rhonchi,  CARDIOVASCULAR: S1 and S2. Regular rate and rhythm. No murmurs, rubs, or gallops.  GASTROINTESTINAL: Soft, nontender, -distended. No masses. Positive bowel sounds. No hepatosplenomegaly.  MUSCULOSKELETAL: No swelling, clubbing, or edema.  NEUROLOGIC: Lethargic SKIN:intact,warm,dry      CBC    Component Value Date/Time   WBC 3.9 (L) 06/22/2018 0332   RBC 4.25 06/22/2018 0332   HGB 13.3 06/22/2018 0332   HCT 39.4 06/22/2018 0332   PLT 117 (L) 06/22/2018 0332   MCV 92.7 06/22/2018 0332   MCH 31.3 06/22/2018 0332   MCHC 33.8 06/22/2018 0332   RDW 16.1 (H) 06/22/2018 0332   LYMPHSABS 0.6 (L) 06/22/2018 0332   MONOABS 0.6 06/22/2018 0332   EOSABS 0.3 06/22/2018 0332   BASOSABS 0.1 06/22/2018 0332   BMP Latest Ref Rng & Units 06/22/2018 06/21/2018 06/20/2018  Glucose 70 - 99 mg/dL 288(F) 374(U) -  BUN 6 - 20 mg/dL 8 7 -  Creatinine 5.14 - 1.24 mg/dL 6.04(N) 9.98(X) -  Sodium 135 - 145 mmol/L 140 141 -  Potassium 3.5 - 5.1 mmol/L 3.9 3.2(L) 3.6  Chloride 98 - 111 mmol/L 107 109 -  CO2 22 - 32 mmol/L 22 22 -  Calcium 8.9 - 10.3 mg/dL 9.0 8.9 -       Radiology Reports No results found.     Assessment/Plan:   SEVERE ALCOHOL WITHDRAWAL -Therapy with Thiamine and MVI -CIWA Protocol -Precedex as  needed -High risk for intubation  -high risk for aspiration  ELECTROLYTES -follow labs as needed -replace as needed -pharmacy consultation and following    DVT/GI PRX ordered TRANSFUSIONS AS NEEDED MONITOR FSBS ASSESS the need for LABS as needed   GI-mild pancreatitis with elevated liver enzymes GI PROPHYLAXIS as indicated Check amylase lipase LFTs  NUTRITIONAL STATUS DIET--> n.p.o. Constipation protocol as indicated   Critical Care Time devoted to patient care services described in this note is 32 minutes.   Overall, patient is critically ill, prognosis is guarded.    Lucie Leather, M.D.  [facility]  Medical Director [facility] Medical Director [facility] Cardio-Pulmonary Department
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
## 2018-06-22 NOTE — Progress Notes (Signed)
[employer] - [place] at [facility]   PATIENT NAME: [name]    MR#:  [id number]  DATE OF BIRTH:  04-Apr-1970  SUBJECTIVE:  CHIEF COMPLAINT:   Chief Complaint  Patient presents with  . Abdominal Pain   No new complaint. Patient previously required Precedex for alcohol withdrawal and had to be transferred to ICU.  Patient has been weaned off Precedex and transferred out of ICU today.  She    REVIEW OF SYSTEMS:  ROS Review of Systems  Constitutional: Positive for malaise. Negative for chills and fever.  HENT: Negative for sore throat.   Eyes: Negative for blurred vision, double vision and pain.  Respiratory: Negative for cough, hemoptysis, shortness of breath and wheezing.   Cardiovascular: Negative for chest pain, palpitations, orthopnea and leg swelling.  Gastrointestinal: Positive for nausea. Negative for constipation, diarrhea, heartburn and vomiting.  Genitourinary: Negative for dysuria and hematuria.  Musculoskeletal: Negative for back pain and joint pain.  Skin: Negative for rash.  Neurological: Negative for sensory change, speech change, focal weakness and headaches.  Endo/Heme/Allergies: Does not bruise/bleed easily.  Psychiatric/Behavioral: Negative for depression. The patient is not nervous/anxious.    DRUG ALLERGIES:  No Known Allergies VITALS:  Blood pressure (!) 154/93, pulse 78, temperature 98.4 F (36.9 C), temperature source Axillary, resp. rate 14, height 5\' 5"  (1.651 m), weight 68 kg, SpO2 100 %. PHYSICAL EXAMINATION:  Physical Exam  GENERAL:  48 y.o.-year-old patient lying in the bed. EYES: Pupils equal, round, reactive to light and accommodation. No scleral icterus. Extraocular muscles intact.  HEENT: Head atraumatic, normocephalic. Oropharynx and nasopharynx clear.  NECK:  Supple, no jugular venous distention. No thyroid enlargement, no tenderness.  LUNGS: Normal breath sounds bilaterally, no wheezing, rales, rhonchi. No use of  accessory muscles of respiration.  CARDIOVASCULAR: S1, S2 normal. No murmurs, rubs, or gallops.  ABDOMEN: Soft, epigastric tenderness, nondistended. Bowel sounds present. No organomegaly or mass.  EXTREMITIES: No cyanosis, clubbing or edema b/l.    NEUROLOGIC: Not following instructions.  Moves all 4 extremities. PSYCHIATRIC: No agitation today.Awake and alert SKIN: No obvious rash, lesion, or ulcer.   LABORATORY PANEL:  Male CBC Recent Labs  Lab 06/22/18 0332  WBC 3.9*  HGB 13.3  HCT 39.4  PLT 117*   ------------------------------------------------------------------------------------------------------------------ Chemistries  Recent Labs  Lab 06/22/18 0332  NA 140  K 3.9  CL 107  CO2 22  GLUCOSE 115*  BUN 8  CREATININE 0.52*  CALCIUM 9.0  MG 2.0  AST 511*  ALT 378*  ALKPHOS 111  BILITOT 1.2   RADIOLOGY:  No results found. ASSESSMENT AND PLAN:     Patient is a 48 year old Spanish-speaking male diagnosed with acute pancreatitis clinically. Alcohol induced.  *Alcohol induced pancreatitis Clinically improved.  Tolerating diet.  Pain meds PRN Nausea - PRN Zofran  *Alcohol abuse and withdrawals Patient presented initially intoxicated with alcohol level of 301. Counseling to quit.  CIWA protocol .  Patient initially required Precedex and was transferred to the ICU.  Weaned off Precedex and transferred out of ICU today.  *Elevated liver enzymes secondary to alcohol abuse.  Noted further elevation in LFTs today.  Repeat levels in a.m. Hepatitis panel ordered  *Thrombocytopenia with no bleeding Improved. Due to alcohol.  Hypokalemia; Replaced by ICU team  DVT prophylaxis;SCDs No heparin products for now due to thrombocytopenia.  All the records are reviewed and case discussed with Care    All the records are reviewed and case discussed with Care Management/Social  Worker. Management plans discussed with the patient, family and they are  in agreement.  CODE STATUS: Full Code  TOTAL TIME TAKING CARE OF THIS PATIENT: 35 minutes.   More than 50% of the time was spent in counseling/coordination of care: YES  POSSIBLE D/C IN 3 DAYS, DEPENDING ON CLINICAL CONDITION.   Zurie Platas M.D on 06/22/2018 at 2:03 PM  Between 7am to 6pm - Pager - [phone]  After 6pm go to [email or web address] - Social research officer, governmentpassword EPAS [facility]  [employer] [place] [facility]  Office  [phone]  CC: Primary care physician; Patient, No Pcp Per  Note: This dictation was prepared with Dragon dictation along with smaller phrase technology. Any transcriptional errors that result from this process are unintentional.

## 2018-06-22 NOTE — Progress Notes (Signed)
Pharmacy Electrolyte Monitoring Consult:  Pharmacy consulted to assist in monitoring and replacing electrolytes in this 48 y.o. male admitted on 06/17/2018 with Abdominal Pain   Labs:  Sodium (mmol/L)  Date Value  06/22/2018 140   Potassium (mmol/L)  Date Value  06/22/2018 3.9   Magnesium (mg/dL)  Date Value  93/90/3009 2.0   Phosphorus (mg/dL)  Date Value  23/30/0762 3.4  06/21/2018 3.4   Calcium (mg/dL)  Date Value  26/33/3545 9.0   Albumin (g/dL)  Date Value  62/56/3893 3.8    Assessment/Plan: Patient ordered additional diazepam 5mg  PO Q6hr x 4 doses. Patient is being transferred to the floor.   No replacement warranted at this time.   Will replace for goal potassium ~ 4 and goal magnesium ~ 2.   Pharmacy will continue to monitor and adjust per consult.   Charlies Silvers [facility]  06/22/2018 12:28 PM

## 2018-06-23 LAB — COMPREHENSIVE METABOLIC PANEL
ALT: 329 U/L — ABNORMAL HIGH (ref 0–44)
AST: 452 U/L — ABNORMAL HIGH (ref 15–41)
Albumin: 3.7 g/dL (ref 3.5–5.0)
Alkaline Phosphatase: 122 U/L (ref 38–126)
Anion gap: 10 (ref 5–15)
BUN: 12 mg/dL (ref 6–20)
CO2: 23 mmol/L (ref 22–32)
Calcium: 9 mg/dL (ref 8.9–10.3)
Chloride: 106 mmol/L (ref 98–111)
Creatinine, Ser: 0.7 mg/dL (ref 0.61–1.24)
GFR calc Af Amer: >60 mL/min (ref >60)
GFR calc non Af Amer: >60 mL/min (ref >60)
Glucose, Bld: 116 mg/dL — ABNORMAL HIGH (ref 70–99)
Potassium: 3.8 mmol/L (ref 3.5–5.1)
Sodium: 139 mmol/L (ref 135–145)
Total Bilirubin: 0.9 mg/dL (ref 0.3–1.2)
Total Protein: 7.2 g/dL (ref 6.5–8.1)

## 2018-06-23 LAB — MAGNESIUM: Magnesium: 2 mg/dL (ref 1.7–2.4)

## 2018-06-23 MED ORDER — PNEUMOCOCCAL VAC POLYVALENT 25 MCG/0.5ML IJ INJ
0.5000 mL | INJECTION | INTRAMUSCULAR | Status: AC
  Administered 2018-06-24: 11:00:00 0.5 mL via INTRAMUSCULAR
  Filled 2018-06-23: qty 0.5

## 2018-06-23 NOTE — Progress Notes (Signed)
   [place] at [place] NAME: Samuel [name]    MR#:  [id number]  DATE OF BIRTH:  Jul 03, 1970  SUBJECTIVE:  CHIEF COMPLAINT:   Chief Complaint  Patient presents with  . Abdominal Pain   Spanish interpreter at bedside   REVIEW OF SYSTEMS:  Review of Systems  Unable to perform ROS: Mental status change      DRUG ALLERGIES:  No Known Allergies VITALS:  Blood pressure 107/77, pulse 92, temperature 98.1 F (36.7 C), temperature source Oral, resp. rate 19, height 5\' 5"  (1.651 m), weight 68 kg, SpO2 99 %. PHYSICAL EXAMINATION:  Physical Exam  GENERAL:  48 y.o.-year-old patient lying in the bed. EYES: Pupils equal, round, reactive to light and accommodation. No scleral icterus. Extraocular muscles intact.  HEENT: Head atraumatic, normocephalic. Oropharynx and nasopharynx clear.  NECK:  Supple, no jugular venous distention. No thyroid enlargement, no tenderness.  LUNGS: Normal breath sounds bilaterally, no wheezing, rales, rhonchi. No use of accessory muscles of respiration.  CARDIOVASCULAR: S1, S2 normal. No murmurs, rubs, or gallops.  ABDOMEN: Soft, epigastric tenderness, nondistended. Bowel sounds present. No organomegaly or mass.  EXTREMITIES: No cyanosis, clubbing or edema b/l.    NEUROLOGIC: Not following instructions.  Moves all 4 extremities. PSYCHIATRIC: No agitation today.Awake and alert. confused SKIN: No obvious rash, lesion, or ulcer.   LABORATORY PANEL:  Male CBC Recent Labs  Lab 06/22/18 0332  WBC 3.9*  HGB 13.3  HCT 39.4  PLT 117*   ------------------------------------------------------------------------------------------------------------------ Chemistries  Recent Labs  Lab 06/23/18 0335  NA 139  K 3.8  CL 106  CO2 23  GLUCOSE 116*  BUN 12  CREATININE 0.70  CALCIUM 9.0  MG 2.0  AST 452*  ALT 329*  ALKPHOS 122  BILITOT 0.9   RADIOLOGY:  No results found. ASSESSMENT AND PLAN:     Patient is  a 48 year old Spanish-speaking male diagnosed with acute pancreatitis clinically. Alcohol induced.  *Alcohol abuse and withdrawals Patient presented initially intoxicated with alcohol level of 301. CIWA protocol .  Patient required Precedex and was transferred to the ICU.  Weaned off Precedex and transferred out of ICU today. Still confused.  *Alcohol induced pancreatitis Resolved. No pain Tolerating diet  *Elevated liver enzymes secondary to alcohol abuse.  Some improvement today  *Thrombocytopenia with no bleeding Improved. Due to alcohol.  Hypokalemia; Replaced  DVT prophylaxis;SCDs No heparin products for now due to thrombocytopenia.  All the records are reviewed and case discussed with Care    All the records are reviewed and case discussed with Care Management/Social Worker. Management plans discussed with the patient, family and they are in agreement.  CODE STATUS: Full Code  TOTAL TIME TAKING CARE OF THIS PATIENT: 35 minutes.   POSSIBLE D/C IN 1-2 DAYS, DEPENDING ON CLINICAL CONDITION.   Leia Alf Khaleem Burchill M.D on 06/23/2018 at 2:51 PM  Between 7am to 6pm - Pager - [phone]  After 6pm go to [email or web address] - Proofreader  [facility]  Office  [phone]  CC: Primary care physician; Patient, No Pcp Per  Note: This dictation was prepared with Dragon dictation along with smaller phrase technology. Any transcriptional errors that result from this process are unintentional.

## 2018-06-23 NOTE — Progress Notes (Signed)
Pharmacy Electrolyte Monitoring Consult:  Pharmacy consulted to assist in monitoring and replacing electrolytes in this 48 y.o. male admitted on 06/17/2018 with Abdominal Pain   Labs:  Sodium (mmol/L)  Date Value  06/23/2018 139   Potassium (mmol/L)  Date Value  06/23/2018 3.8   Magnesium (mg/dL)  Date Value  06/23/2018 2.0   Phosphorus (mg/dL)  Date Value  06/21/2018 3.4  06/21/2018 3.4   Calcium (mg/dL)  Date Value  06/23/2018 9.0   Albumin (g/dL)  Date Value  06/23/2018 3.7    Assessment/Plan: K 3.8 Mag 2.0  Scr 0.70 No replacement warranted at this time.   Will replace for goal potassium ~ 4 and goal magnesium ~ 2.  Pharmacy will continue to monitor and adjust per consult.   Chinita Greenland PharmD Clinical Pharmacist 06/23/2018

## 2018-06-24 LAB — COMPREHENSIVE METABOLIC PANEL
ALT: 329 U/L — ABNORMAL HIGH (ref 0–44)
AST: 404 U/L — ABNORMAL HIGH (ref 15–41)
Albumin: 3.9 g/dL (ref 3.5–5.0)
Alkaline Phosphatase: 101 U/L (ref 38–126)
Anion gap: 7 (ref 5–15)
BUN: 10 mg/dL (ref 6–20)
CO2: 24 mmol/L (ref 22–32)
Calcium: 9.3 mg/dL (ref 8.9–10.3)
Chloride: 108 mmol/L (ref 98–111)
Creatinine, Ser: 0.59 mg/dL — ABNORMAL LOW (ref 0.61–1.24)
GFR calc Af Amer: >60 mL/min (ref >60)
GFR calc non Af Amer: >60 mL/min (ref >60)
Glucose, Bld: 121 mg/dL — ABNORMAL HIGH (ref 70–99)
Potassium: 4 mmol/L (ref 3.5–5.1)
Sodium: 139 mmol/L (ref 135–145)
Total Bilirubin: 0.9 mg/dL (ref 0.3–1.2)
Total Protein: 7.1 g/dL (ref 6.5–8.1)

## 2018-06-24 LAB — AMMONIA: Ammonia: 31 umol/L (ref 9–35)

## 2018-06-24 NOTE — Progress Notes (Signed)
   [place] at [place] NAME: Samuel [name]    MR#:  [id number]  DATE OF BIRTH:  25-Nov-1970  SUBJECTIVE:  CHIEF COMPLAINT:   Chief Complaint  Patient presents with  . Abdominal Pain   Still confused Good UOP   REVIEW OF SYSTEMS:  Review of Systems  Unable to perform ROS: Mental status change    DRUG ALLERGIES:  No Known Allergies VITALS:  Blood pressure 112/79, pulse 100, temperature 97.6 F (36.4 C), temperature source Oral, resp. rate 18, height 5\' 5"  (1.651 m), weight 68 kg, SpO2 100 %. PHYSICAL EXAMINATION:  Physical Exam  GENERAL:  48 y.o.-year-old patient lying in the bed. EYES: Pupils equal, round, reactive to light and accommodation. No scleral icterus. Extraocular muscles intact.  HEENT: Head atraumatic, normocephalic. Oropharynx and nasopharynx clear.  NECK:  Supple, no jugular venous distention. No thyroid enlargement, no tenderness.  LUNGS: Normal breath sounds bilaterally, no wheezing, rales, rhonchi. No use of accessory muscles of respiration.  CARDIOVASCULAR: S1, S2 normal. No murmurs, rubs, or gallops.  ABDOMEN: Soft, epigastric tenderness, nondistended. Bowel sounds present. No organomegaly or mass.  EXTREMITIES: No cyanosis, clubbing or edema b/l.    NEUROLOGIC: Not following instructions.  Moves all 4 extremities. PSYCHIATRIC: No agitation today.Awake and alert. confused SKIN: No obvious rash, lesion, or ulcer.   LABORATORY PANEL:  Male CBC Recent Labs  Lab 06/22/18 0332  WBC 3.9*  HGB 13.3  HCT 39.4  PLT 117*   ------------------------------------------------------------------------------------------------------------------ Chemistries  Recent Labs  Lab 06/23/18 0335 06/24/18 0409  NA 139 139  K 3.8 4.0  CL 106 108  CO2 23 24  GLUCOSE 116* 121*  BUN 12 10  CREATININE 0.70 0.59*  CALCIUM 9.0 9.3  MG 2.0  --   AST 452* 404*  ALT 329* 329*  ALKPHOS 122 101  BILITOT 0.9 0.9   RADIOLOGY:   No results found. ASSESSMENT AND PLAN:     Patient is a 48 year old Spanish-speaking male diagnosed with acute pancreatitis clinically. Alcohol induced.  *Alcohol abuse and withdrawals Patient presented initially intoxicated with alcohol level of 301. CIWA protocol .  Patient required Precedex and was transferred to the ICU.  Weaned off Precedex and transferred out of ICU  Still confused.  No withdrawals at this time Check ammonia level  *Alcohol induced pancreatitis Resolved. No pain Tolerating diet  *Elevated liver enzymes secondary to alcohol abuse.  Improving  *Thrombocytopenia with no bleeding Improved. Due to alcohol.  Hypokalemia; Replaced  DVT prophylaxis;SCDs No heparin products for now due to thrombocytopenia.  Consult social work for placement.  Unable to contact any family.  Patient is confused.  CODE STATUS: Full Code  TOTAL TIME TAKING CARE OF THIS PATIENT: 35 minutes.   POSSIBLE D/C IN 1-2 DAYS, DEPENDING ON CLINICAL CONDITION.   Samuel [name] M.D on 06/24/2018 at 1:04 PM  Between 7am to 6pm - Pager - [phone]  After 6pm go to [email or web address] - Proofreader  [facility]  Office  [phone]  CC: Primary care physician; Patient, No Pcp Per  Note: This dictation was prepared with Dragon dictation along with smaller phrase technology. Any transcriptional errors that result from this process are unintentional.

## 2018-06-24 NOTE — Progress Notes (Signed)
Pharmacy Electrolyte Monitoring Consult:  Pharmacy consulted to assist in monitoring and replacing electrolytes in this 48 y.o. male admitted on 06/17/2018 with Abdominal Pain   Labs:  Sodium (mmol/L)  Date Value  06/24/2018 139   Potassium (mmol/L)  Date Value  06/24/2018 4.0   Magnesium (mg/dL)  Date Value  06/23/2018 2.0   Phosphorus (mg/dL)  Date Value  06/21/2018 3.4  06/21/2018 3.4   Calcium (mg/dL)  Date Value  06/24/2018 9.3   Albumin (g/dL)  Date Value  06/24/2018 3.9    Assessment/Plan: K 4.0   Scr 0.59 No replacement warranted at this time.   Electrolytes have been WNL for the last 3 days.  Will sign off of consult for now.   Reconsult if needed.  Chinita Greenland PharmD Clinical Pharmacist 06/24/2018

## 2018-06-24 NOTE — Plan of Care (Signed)
The patient Aox2 during the day. Ammonia level is normal. No falls. Tele sitter in place.  Problem: Education: Goal: Knowledge of Pancreatitis treatment and prevention will improve Outcome: Progressing   Problem: Health Behavior/Discharge Planning: Goal: Ability to formulate a plan to maintain an alcohol-free life will improve Outcome: Progressing   Problem: Nutritional: Goal: Ability to achieve adequate nutritional intake will improve Outcome: Progressing   Problem: Clinical Measurements: Goal: Complications related to the disease process, condition or treatment will be avoided or minimized Outcome: Progressing   Problem: Education: Goal: Knowledge of disease or condition will improve Outcome: Progressing Goal: Understanding of discharge needs will improve Outcome: Progressing   Problem: Health Behavior/Discharge Planning: Goal: Ability to identify changes in lifestyle to reduce recurrence of condition will improve Outcome: Progressing Goal: Identification of resources available to assist in meeting health care needs will improve Outcome: Progressing   Problem: Physical Regulation: Goal: Complications related to the disease process, condition or treatment will be avoided or minimized Outcome: Progressing   Problem: Safety: Goal: Ability to remain free from injury will improve Outcome: Progressing   Problem: Education: Goal: Knowledge of General Education information will improve Description Including pain rating scale, medication(s)/side effects and non-pharmacologic comfort measures Outcome: Progressing   Problem: Health Behavior/Discharge Planning: Goal: Ability to manage health-related needs will improve Outcome: Progressing   Problem: Clinical Measurements: Goal: Ability to maintain clinical measurements within normal limits will improve Outcome: Progressing Goal: Will remain free from infection Outcome: Progressing Goal: Diagnostic test results will  improve Outcome: Progressing Goal: Respiratory complications will improve Outcome: Progressing Goal: Cardiovascular complication will be avoided Outcome: Progressing   Problem: Activity: Goal: Risk for activity intolerance will decrease Outcome: Progressing   Problem: Nutrition: Goal: Adequate nutrition will be maintained Outcome: Progressing   Problem: Coping: Goal: Level of anxiety will decrease Outcome: Progressing   Problem: Elimination: Goal: Will not experience complications related to bowel motility Outcome: Progressing Goal: Will not experience complications related to urinary retention Outcome: Progressing   Problem: Pain Managment: Goal: General experience of comfort will improve Outcome: Progressing   Problem: Safety: Goal: Ability to remain free from injury will improve Outcome: Progressing   Problem: Skin Integrity: Goal: Risk for impaired skin integrity will decrease Outcome: Progressing

## 2018-06-25 NOTE — Discharge Summary (Signed)
[place] at [place] NAME: Samuel [name]    MR#:  [id number]  DATE OF BIRTH:  [birth year]  DATE OF ADMISSION:  06/17/2018 ADMITTING PHYSICIAN: Otila Back, MD  DATE OF DISCHARGE: 06/25/2018   PRIMARY CARE PHYSICIAN: Patient, No Pcp Per    ADMISSION DIAGNOSIS:  Acute pancreatitis [K85.90] Alcohol-induced acute pancreatitis without infection or necrosis [K85.20]  DISCHARGE DIAGNOSIS:  Active Problems:   Pancreatitis   SECONDARY DIAGNOSIS:   Past Medical History:  Diagnosis Date  . Alcohol use     HOSPITAL COURSE:   48 year old male with history of EtOH abuse who presented to the ER due to abdominal pain.   1.  EtOH related pancreatitis which was etiology abdominal pain: Patient was treated with IV fluids, antiemetics and pain control.  Pancreatitis has resolved.  Abdominal pain has resolved.  Patient tolerating regular diet.  2.  EtOH abuse: Patient required ICU admission on Precedex.  He has been weaned off Precedex.  Patient has resolved EtOH withdrawal.  3.  Elevated LFTs due to EtOH abuse  4.  Hypokalemia: This has been repleted  5.  Thrombocytopenia: This is due to EtOH abuse.  Patient encouraged to stop drinking EtOH.    DISCHARGE CONDITIONS AND DIET:   Stable for discharge on regular diet  CONSULTS OBTAINED:    DRUG ALLERGIES:  No Known Allergies  DISCHARGE MEDICATIONS:   Allergies as of 06/25/2018   No Known Allergies     Medication List    STOP taking these medications   pantoprazole 20 MG tablet Commonly known as:  PROTONIX         Today   CHIEF COMPLAINT:   No acute issues overnight. MEntal status is at baseline.   VITAL SIGNS:  Blood pressure 116/78, pulse (!) 108, temperature 97.6 F (36.4 C), temperature source Oral, resp. rate 16, height 5\' 5"  (1.651 m), weight 68 kg, SpO2 97 %.   REVIEW OF SYSTEMS:  Review of Systems  Constitutional: Negative.  Negative for chills, fever and  malaise/fatigue.  HENT: Negative.  Negative for ear discharge, ear pain, hearing loss, nosebleeds and sore throat.   Eyes: Negative.  Negative for blurred vision and pain.  Respiratory: Negative.  Negative for cough, hemoptysis, shortness of breath and wheezing.   Cardiovascular: Negative.  Negative for chest pain, palpitations and leg swelling.  Gastrointestinal: Negative.  Negative for abdominal pain, blood in stool, diarrhea, nausea and vomiting.  Genitourinary: Negative.  Negative for dysuria.  Musculoskeletal: Negative.  Negative for back pain.  Skin: Negative.   Neurological: Negative for dizziness, tremors, speech change, focal weakness, seizures and headaches.  Endo/Heme/Allergies: Negative.  Does not bruise/bleed easily.  Psychiatric/Behavioral: Negative.  Negative for depression, hallucinations and suicidal ideas.     PHYSICAL EXAMINATION:  GENERAL:  48 y.o.-year-old patient lying in the bed with no acute distress.  NECK:  Supple, no jugular venous distention. No thyroid enlargement, no tenderness.  LUNGS: Normal breath sounds bilaterally, no wheezing, rales,rhonchi  No use of accessory muscles of respiration.  CARDIOVASCULAR: S1, S2 normal. No murmurs, rubs, or gallops.  ABDOMEN: Soft, non-tender, non-distended. Bowel sounds present. No organomegaly or mass.  EXTREMITIES: No pedal edema, cyanosis, or clubbing.  PSYCHIATRIC: The patient is alert and oriented x 3.  SKIN: No obvious rash, lesion, or ulcer.   DATA REVIEW:   CBC Recent Labs  Lab 06/22/18 0332  WBC 3.9*  HGB 13.3  HCT 39.4  PLT 117*    Chemistries  Recent Labs  Lab 06/23/18 0335 06/24/18 0409  NA 139 139  K 3.8 4.0  CL 106 108  CO2 23 24  GLUCOSE 116* 121*  BUN 12 10  CREATININE 0.70 0.59*  CALCIUM 9.0 9.3  MG 2.0  --   AST 452* 404*  ALT 329* 329*  ALKPHOS 122 101  BILITOT 0.9 0.9    Cardiac Enzymes No results for input(s): TROPONINI in the last 168 hours.  Microbiology Results   @[email or web address] @  RADIOLOGY:  No results found.    Allergies as of 06/25/2018   No Known Allergies     Medication List    STOP taking these medications   pantoprazole 20 MG tablet Commonly known as:  PROTONIX         Management plans discussed with the patient and he is in agreement. Stable for discharge   Patient should follow up with A PCP (nees to find one)  CODE STATUS:     Code Status Orders  (From admission, onward)         Start     Ordered   06/17/18 1404  Full code  Continuous     06/17/18 1404        Code Status History    Date Active Date Inactive Code Status Order ID Comments User Context   10/09/2015 0538 10/09/2015 1241 Full Code [id number]  Darci CurrentBrown,  N, MD ED      TOTAL TIME TAKING CARE OF THIS PATIENT: 38 minutes.    Note: This dictation was prepared with Dragon dictation along with smaller phrase technology. Any transcriptional errors that result from this process are unintentional.  Adrian SaranSital Darreon Lutes M.D on 06/25/2018 at 1:20 PM  Between 7am to 6pm - Pager - [phone] After 6pm go to [email or web address] - password [email or web address]  [facility]  Office  [phone]  CC: Primary care physician; Patient, No Pcp Per

## 2018-06-25 NOTE — TOC Transition Note (Signed)
Transition of Care [facility]) - CM/SW Discharge Note   Patient Details  Name: Samuel [name] MRN: [id number] Date of Birth: 1970/03/07  Transition of Care [facility]) CM/SW Contact:  Shela Leff, LCSW Phone Number: 06/25/2018, 1:59 PM   Clinical Narrative:   Patient much improved and is to discharge today per physician. Patient was able to find a ride home. No further needs.    Final next level of care: Home/Self Care Barriers to Discharge: No Barriers Identified   Patient Goals and CMS Choice Patient states their goals for this hospitalization and ongoing recovery are:: pt is still confused and unable to voice goals of care      Discharge Placement                       Discharge Plan and Services   Discharge Planning Services: CM Consult                                 Social Determinants of Health (SDOH) Interventions     Readmission Risk Interventions Readmission Risk Prevention Plan 06/25/2018  Medication Screening Complete  Transportation Screening Complete  Some recent data might be hidden

## 2018-07-02 ENCOUNTER — Other Ambulatory Visit: Payer: Self-pay

## 2018-07-02 ENCOUNTER — Emergency Department
Admission: EM | Admit: 2018-07-02 | Discharge: 2018-07-02 | Disposition: A | Discharge status: Home / Self Care | Payer: Self-pay | Attending: Emergency Medicine | Admitting: Emergency Medicine

## 2018-07-02 DIAGNOSIS — F10920 Alcohol use, unspecified with intoxication, uncomplicated: Secondary | ICD-10-CM | POA: Insufficient documentation

## 2018-07-02 LAB — COMPREHENSIVE METABOLIC PANEL
ALT: 196 U/L — ABNORMAL HIGH (ref 0–44)
AST: 118 U/L — ABNORMAL HIGH (ref 15–41)
Albumin: 3.8 g/dL (ref 3.5–5.0)
Alkaline Phosphatase: 97 U/L (ref 38–126)
Anion gap: 10 (ref 5–15)
BUN: 7 mg/dL (ref 6–20)
CO2: 23 mmol/L (ref 22–32)
Calcium: 8.6 mg/dL — ABNORMAL LOW (ref 8.9–10.3)
Chloride: 110 mmol/L (ref 98–111)
Creatinine, Ser: 0.65 mg/dL (ref 0.61–1.24)
GFR calc Af Amer: >60 mL/min (ref >60)
GFR calc non Af Amer: >60 mL/min (ref >60)
Glucose, Bld: 98 mg/dL (ref 70–99)
Potassium: 3.5 mmol/L (ref 3.5–5.1)
Sodium: 143 mmol/L (ref 135–145)
Total Bilirubin: 0.2 mg/dL — ABNORMAL LOW (ref 0.3–1.2)
Total Protein: 6.9 g/dL (ref 6.5–8.1)

## 2018-07-02 LAB — CBC WITH DIFFERENTIAL/PLATELET
Abs Immature Granulocytes: 0.03 10*3/uL (ref 0.00–0.07)
Basophils Absolute: 0.1 10*3/uL (ref 0.0–0.1)
Basophils Relative: 2 %
Eosinophils Absolute: 0.3 10*3/uL (ref 0.0–0.5)
Eosinophils Relative: 5 %
HCT: 35.6 % — ABNORMAL LOW (ref 39.0–52.0)
Hemoglobin: 12 g/dL — ABNORMAL LOW (ref 13.0–17.0)
Immature Granulocytes: 1 %
Lymphocytes Relative: 32 %
Lymphs Abs: 1.8 10*3/uL (ref 0.7–4.0)
MCH: 31.7 pg (ref 26.0–34.0)
MCHC: 33.7 g/dL (ref 30.0–36.0)
MCV: 94.2 fL (ref 80.0–100.0)
Monocytes Absolute: 0.5 10*3/uL (ref 0.1–1.0)
Monocytes Relative: 9 %
Neutro Abs: 2.9 10*3/uL (ref 1.7–7.7)
Neutrophils Relative %: 51 %
Platelets: 382 10*3/uL (ref 150–400)
RBC: 3.78 MIL/uL — ABNORMAL LOW (ref 4.22–5.81)
RDW: 15.8 % — ABNORMAL HIGH (ref 11.5–15.5)
WBC: 5.7 10*3/uL (ref 4.0–10.5)
nRBC: 0 % (ref 0.0–0.2)

## 2018-07-02 LAB — ETHANOL: Alcohol, Ethyl (B): 261 mg/dL — ABNORMAL HIGH (ref <10)

## 2018-07-02 LAB — LIPASE, BLOOD: Lipase: 53 U/L — ABNORMAL HIGH (ref 11–51)

## 2018-07-02 MED ORDER — LORAZEPAM 2 MG/ML IJ SOLN: 0.0000 mg | Freq: Four times a day (QID) | INTRAMUSCULAR | Status: DC

## 2018-07-02 MED ORDER — THIAMINE HCL 100 MG/ML IJ SOLN
Freq: Once | INTRAVENOUS | Status: AC
  Administered 2018-07-02: 05:00:00 via INTRAVENOUS
  Filled 2018-07-02: qty 1000

## 2018-07-02 MED ORDER — SODIUM CHLORIDE 0.9 % IV BOLUS
1000.0000 mL | Freq: Once | INTRAVENOUS | Status: AC
  Administered 2018-07-02: 1000 mL via INTRAVENOUS

## 2018-07-02 MED ORDER — THIAMINE HCL 100 MG/ML IJ SOLN
100.0000 mg | Freq: Every day | INTRAMUSCULAR | Status: DC
  Administered 2018-07-02: 05:00:00 100 mg via INTRAVENOUS
  Filled 2018-07-02: qty 2

## 2018-07-02 NOTE — ED Provider Notes (Signed)
Patient is clinically sober and able to walk normally we will discharge him.   Nena Polio, MD 07/02/18 1158

## 2018-07-02 NOTE — ED Provider Notes (Signed)
[facility] Emergency Department Provider Note   ____________________________________________   First MD Initiated Contact with Patient 07/02/18 0422     (approximate)  I have reviewed the triage vital signs and the nursing notes.   HISTORY  Chief Complaint Alcohol Intoxication  History obtained via [place] interpreter  HPI Samuel [name] is a 48 y.o. male brought to the ED via [employer] with a chief complaint of alcohol intoxication.  Patient with recent hospitalization for alcohol induced pancreatitis.  States he walked from his apartment to the store to buy beers, walked [place] the [place] drinking his beers and then lay down to sleep because he was tired.  Denies falling or striking his head or LOC.  Found by police on the side of the highway.  Patient voices no complaints.  Denies active SI/HI/AH/VH.       Past Medical History:  Diagnosis Date  . Alcohol use     Patient Active Problem List   Diagnosis Date Noted  . Pancreatitis 06/17/2018    No past surgical history on file.  Prior to Admission medications   Not on File    Allergies Patient has no known allergies.  No family history on file.  Social History Social History   Tobacco Use  . Smoking status: Never Smoker  . Smokeless tobacco: Never Used  Substance Use Topics  . Alcohol use: Yes    Alcohol/week: 12.0 standard drinks    Types: 12 Cans of beer per week    Comment: Pt drinks 1 pack/ day  . Drug use: No    Review of Systems  Constitutional: No fever/chills Eyes: No visual changes. ENT: No sore throat. Cardiovascular: Denies chest pain. Respiratory: Denies shortness of breath. Gastrointestinal: No abdominal pain.  No nausea, no vomiting.  No diarrhea.  No constipation. Genitourinary: Negative for dysuria. Musculoskeletal: Negative for back pain. Skin: Negative for rash. Neurological: Negative for headaches, focal weakness or numbness. Psychiatric:  Positive for  intoxication.  ____________________________________________   PHYSICAL EXAM:  VITAL SIGNS: ED Triage Vitals  Enc Vitals Group     BP 07/02/18 0405 125/76     Pulse Rate 07/02/18 0405 96     Resp 07/02/18 0405 16     Temp 07/02/18 0405 (!) 97.5 F (36.4 C)     Temp Source 07/02/18 0405 Oral     SpO2 07/02/18 0405 95 %     Weight 07/02/18 0406 140 lb (63.5 kg)     Height 07/02/18 0406 5\' 2"  (1.575 m)     Head Circumference --      Peak Flow --      Pain Score 07/02/18 0406 0     Pain Loc --      Pain Edu? --      Excl. in [place]? --     Constitutional: Alert and oriented. Well appearing and in no acute distress. Eyes: Conjunctivae are normal. PERRL. EOMI. Head: Atraumatic. Nose: Atraumatic. Mouth/Throat: Mucous membranes are moist.  No dental malocclusion. Neck: No stridor.  No cervical spine tenderness to palpation. Cardiovascular: Normal rate, regular rhythm. Grossly normal heart sounds.  Good peripheral circulation. Respiratory: Normal respiratory effort.  No retractions. Lungs CTAB. Gastrointestinal: Soft and nontender to light or deep palpation. No distention. No abdominal bruits. No CVA tenderness. Musculoskeletal: No lower extremity tenderness nor edema.  No joint effusions. Neurologic:  Normal speech and language. No gross focal neurologic deficits are appreciated. No gait instability. Skin:  Skin is warm, dry and intact. No  rash noted. Psychiatric: Mood and affect are normal. Speech and behavior are normal.  ____________________________________________   LABS (all labs ordered are listed, but only abnormal results are displayed)  Labs Reviewed  CBC WITH DIFFERENTIAL/PLATELET - Abnormal; Notable for the following components:      Result Value   RBC 3.78 (*)    Hemoglobin 12.0 (*)    HCT 35.6 (*)    RDW 15.8 (*)    All other components within normal limits  ETHANOL - Abnormal; Notable for the following components:   Alcohol, Ethyl (B) 261 (*)    All other  components within normal limits  COMPREHENSIVE METABOLIC PANEL - Abnormal; Notable for the following components:   Calcium 8.6 (*)    AST 118 (*)    ALT 196 (*)    Total Bilirubin 0.2 (*)    All other components within normal limits  LIPASE, BLOOD - Abnormal; Notable for the following components:   Lipase 53 (*)    All other components within normal limits   ____________________________________________  EKG  None ____________________________________________  RADIOLOGY  ED MD interpretation: None  Official radiology report(s): No results found.  ____________________________________________   PROCEDURES  Procedure(s) performed (including Critical Care):  Procedures   ____________________________________________   INITIAL IMPRESSION / ASSESSMENT AND PLAN / ED COURSE  As part of my medical decision making, I reviewed the following data within the electronic MEDICAL RECORD NUMBER Nursing notes reviewed and incorporated, Labs reviewed, Old chart reviewed and Notes from prior ED visits     Samuel [name] was evaluated in Emergency Department on 07/02/2018 for the symptoms described in the history of present illness. He was evaluated in the context of the global COVID-19 pandemic, which necessitated consideration that the patient might be at risk for infection with the SARS-CoV-2 virus that causes COVID-19. Institutional protocols and algorithms that pertain to the evaluation of patients at risk for COVID-19 are in a state of rapid change based on information released by regulatory bodies including the CDC and federal and state organizations. These policies and algorithms were followed during the patient's care in the ED.   48 year old alcoholic found on the side of the highway sleeping and intoxicated.  Patient is cooperative and clearly indicates he went to the store to buy beer and simply lay down to sleep on the highway rather than SI or trauma.  Will obtain basic lab work,  initiate IV fluid resuscitation.  Placed on CIWA scale.   Clinical Course as of Jul 02 715  Mon Jul 02, 2018  [id number] Patient sleeping in no acute distress.  Remains hemodynamically stable.  Laboratory results notable for elevated EtOH and transaminases and minimal elevation of lipase which are secondary to alcoholism.  Anticipate discharge once patient is sober and ambulatory.  Care will be transferred to the oncoming provider.   [JS]    Clinical Course User Index [JS] Irean HongSung,  J, MD     ____________________________________________   FINAL CLINICAL IMPRESSION(S) / ED DIAGNOSES  Final diagnoses:  Alcoholic intoxication without complication [facility](HCC)     ED Discharge Orders    None       Note:  This document was prepared using Dragon voice recognition software and may include unintentional dictation errors.   Irean HongSung,  J, MD 07/02/18 [phone]

## 2018-07-02 NOTE — ED Notes (Signed)
Attempted to call both contacts in patient's chart. One stated I had the wrong number, the other went to voicemail. Will use interpreter to attempt to contact ride for patient.

## 2018-07-02 NOTE — ED Notes (Addendum)
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 Per MD and charge RN, this is acceptable as long as patient is A&Ox4 and ambulatory. Patient able to ambulate without assistance and with steady gait. Patient alert and oriented x4. Discharge instructions reviewed using interpreter. All questions answered.

## 2018-07-02 NOTE — ED Triage Notes (Signed)
Pt here with BPD with intoxication. Per interpreter assist, pt was consuming ETOH, [place] on [place], when he decided to sleep. [place] [address]. Pt has no obvious injuries.

## 2018-07-02 NOTE — Discharge Instructions (Signed)
Drink alcohol only in moderation.  Return to the ER for worsening symptoms, persistent vomiting, difficulty breathing or other concerns. 

## 2018-09-21 ENCOUNTER — Other Ambulatory Visit: Payer: Self-pay

## 2018-09-21 ENCOUNTER — Emergency Department
Admission: EM | Admit: 2018-09-21 | Discharge: 2018-09-21 | Disposition: A | Discharge status: Home / Self Care | Payer: Self-pay | Attending: Emergency Medicine | Admitting: Emergency Medicine

## 2018-09-21 ENCOUNTER — Emergency Department: Payer: Self-pay

## 2018-09-21 DIAGNOSIS — R1032 Left lower quadrant pain: Secondary | ICD-10-CM | POA: Insufficient documentation

## 2018-09-21 DIAGNOSIS — F10920 Alcohol use, unspecified with intoxication, uncomplicated: Secondary | ICD-10-CM | POA: Insufficient documentation

## 2018-09-21 DIAGNOSIS — R109 Unspecified abdominal pain: Secondary | ICD-10-CM

## 2018-09-21 DIAGNOSIS — R51 Headache: Secondary | ICD-10-CM | POA: Insufficient documentation

## 2018-09-21 DIAGNOSIS — Y908 Blood alcohol level of 240 mg/100 ml or more: Secondary | ICD-10-CM | POA: Insufficient documentation

## 2018-09-21 LAB — CBC WITH DIFFERENTIAL/PLATELET
Abs Immature Granulocytes: 0.01 10*3/uL (ref 0.00–0.07)
Basophils Absolute: 0.1 10*3/uL (ref 0.0–0.1)
Basophils Relative: 2 %
Eosinophils Absolute: 0.1 10*3/uL (ref 0.0–0.5)
Eosinophils Relative: 3 %
HCT: 38 % — ABNORMAL LOW (ref 39.0–52.0)
Hemoglobin: 13 g/dL (ref 13.0–17.0)
Immature Granulocytes: 0 %
Lymphocytes Relative: 30 %
Lymphs Abs: 1.1 10*3/uL (ref 0.7–4.0)
MCH: 32 pg (ref 26.0–34.0)
MCHC: 34.2 g/dL (ref 30.0–36.0)
MCV: 93.6 fL (ref 80.0–100.0)
Monocytes Absolute: 0.5 10*3/uL (ref 0.1–1.0)
Monocytes Relative: 13 %
Neutro Abs: 2 10*3/uL (ref 1.7–7.7)
Neutrophils Relative %: 52 %
Platelets: 115 10*3/uL — ABNORMAL LOW (ref 150–400)
RBC: 4.06 MIL/uL — ABNORMAL LOW (ref 4.22–5.81)
RDW: 13.1 % (ref 11.5–15.5)
WBC: 3.8 10*3/uL — ABNORMAL LOW (ref 4.0–10.5)
nRBC: 0 % (ref 0.0–0.2)

## 2018-09-21 LAB — URINALYSIS, COMPLETE (UACMP) WITH MICROSCOPIC
Bacteria, UA: NONE SEEN
Bilirubin Urine: NEGATIVE
Glucose, UA: NEGATIVE mg/dL
Hgb urine dipstick: NEGATIVE
Ketones, ur: NEGATIVE mg/dL
Leukocytes,Ua: NEGATIVE
Nitrite: NEGATIVE
Protein, ur: NEGATIVE mg/dL
Specific Gravity, Urine: 1.002 — ABNORMAL LOW (ref 1.005–1.030)
Squamous Epithelial / HPF: NONE SEEN (ref 0–5)
WBC, UA: NONE SEEN WBC/hpf (ref 0–5)
pH: 6 (ref 5.0–8.0)

## 2018-09-21 LAB — COMPREHENSIVE METABOLIC PANEL
ALT: 77 U/L — ABNORMAL HIGH (ref 0–44)
AST: 116 U/L — ABNORMAL HIGH (ref 15–41)
Albumin: 4.2 g/dL (ref 3.5–5.0)
Alkaline Phosphatase: 91 U/L (ref 38–126)
Anion gap: 15 (ref 5–15)
BUN: 5 mg/dL — ABNORMAL LOW (ref 6–20)
CO2: 22 mmol/L (ref 22–32)
Calcium: 8.9 mg/dL (ref 8.9–10.3)
Chloride: 104 mmol/L (ref 98–111)
Creatinine, Ser: 0.66 mg/dL (ref 0.61–1.24)
GFR calc Af Amer: >60 mL/min (ref >60)
GFR calc non Af Amer: >60 mL/min (ref >60)
Glucose, Bld: 104 mg/dL — ABNORMAL HIGH (ref 70–99)
Potassium: 3.5 mmol/L (ref 3.5–5.1)
Sodium: 141 mmol/L (ref 135–145)
Total Bilirubin: 0.5 mg/dL (ref 0.3–1.2)
Total Protein: 7.5 g/dL (ref 6.5–8.1)

## 2018-09-21 LAB — ETHANOL: Alcohol, Ethyl (B): 327 mg/dL (ref <10)
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
## 2018-09-21 IMAGING — CT CT HEAD W/O CM
4 series · 16 of 47 positions shown, 18 images · non-contrast
Comparison: No pertinent prior studies available for comparison.

CLINICAL DATA: Headache, posttraumatic.

EXAM:
CT HEAD WITHOUT CONTRAST
TECHNIQUE: Contiguous axial images were obtained from the base of the skull
through the vertex without intravenous contrast.

[Series 2: head wo · axial · 0.41mm/px · z∈[-158,-48]mm · 7 of 30 slices shown, 9 images]
[im 4/30  brain]
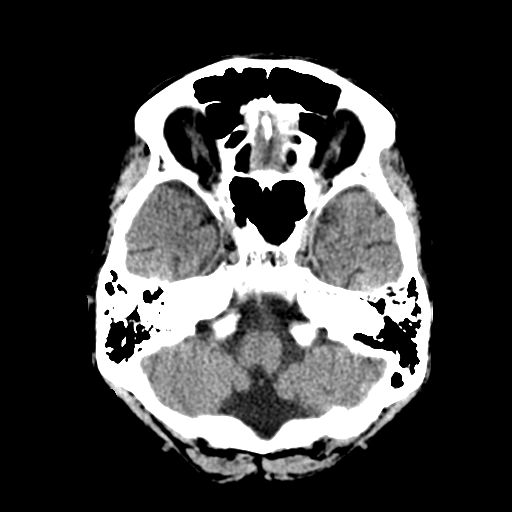
[im 4/30  bone]
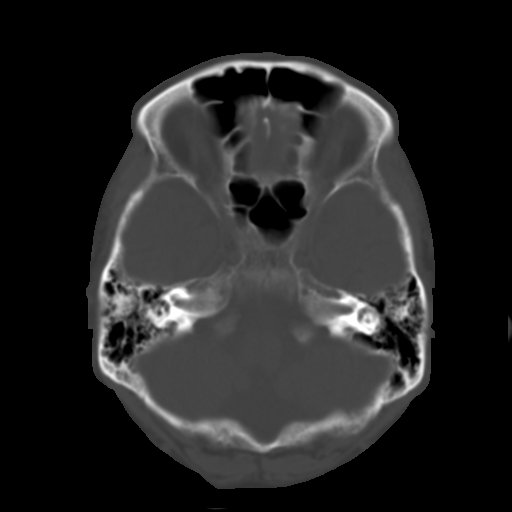
[im 8/30  brain]
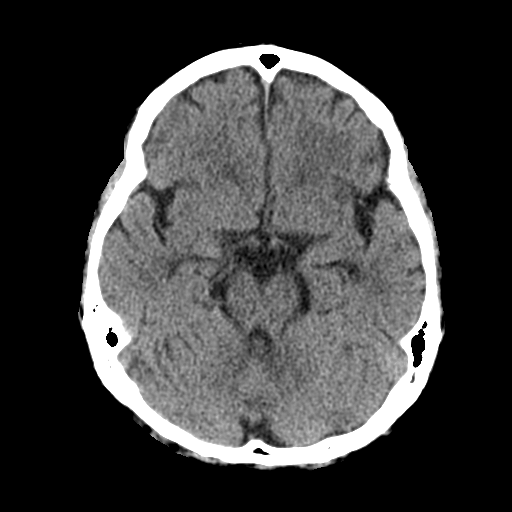
[im 11/30  brain]
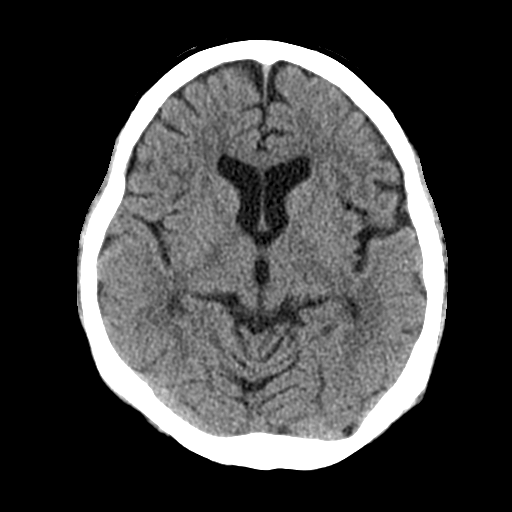
[im 15/30  brain]
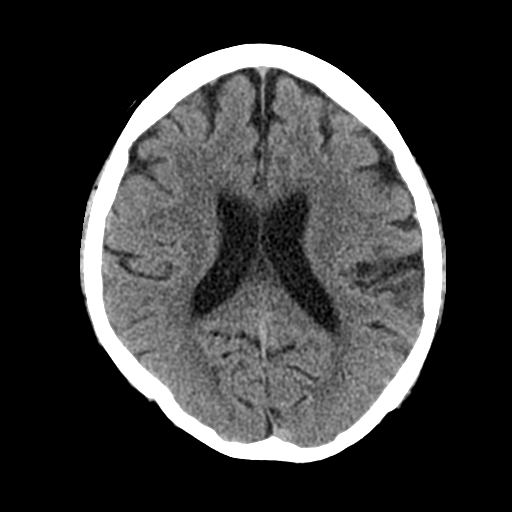
[im 19/30  brain]
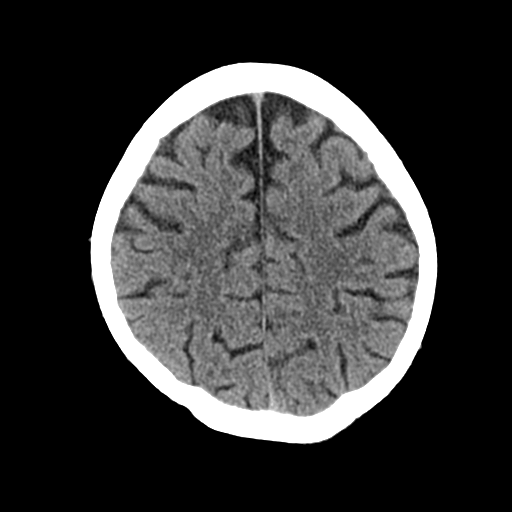
[im 19/30  bone]
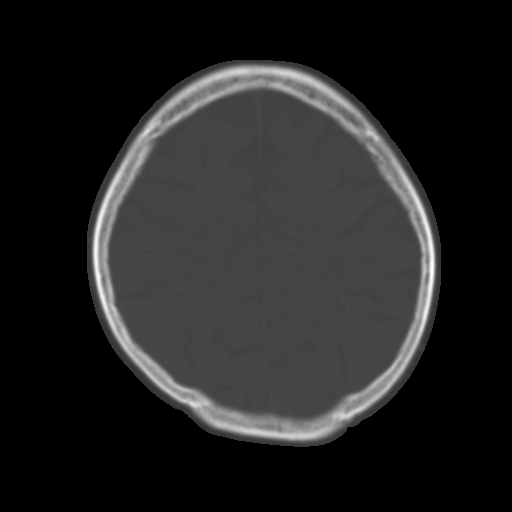
[im 22/30  brain]
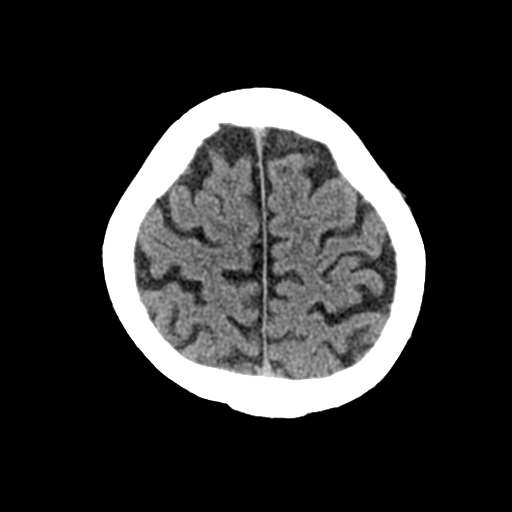
[im 26/30  brain]
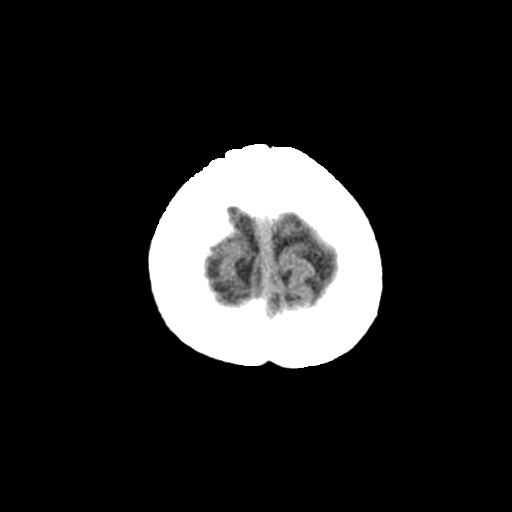

[Series 3: head bone · axial · 0.41mm/px · z∈[-159,-129]mm · 3 of 75 slices shown]
[im 8/75  bone]
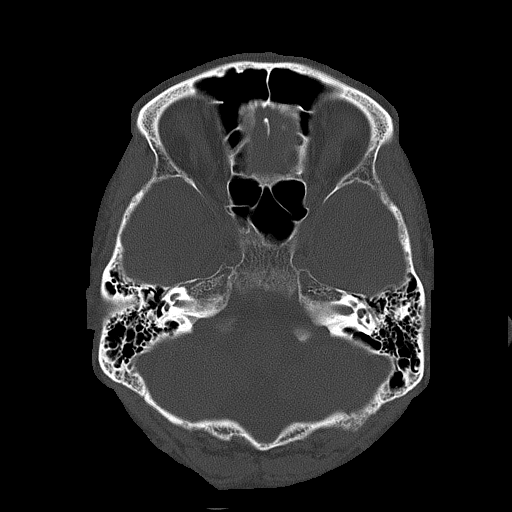
[im 15/75  bone]
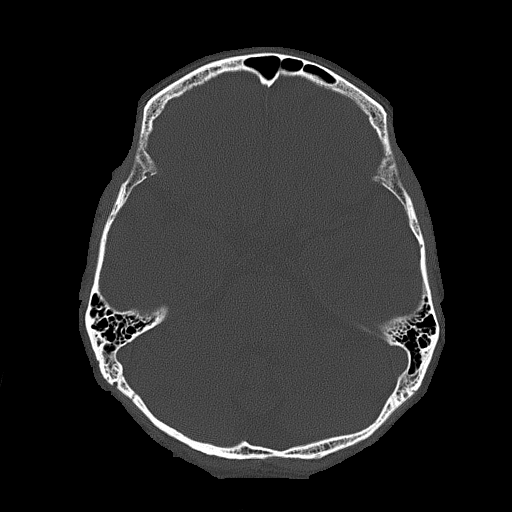
[im 23/75  bone]
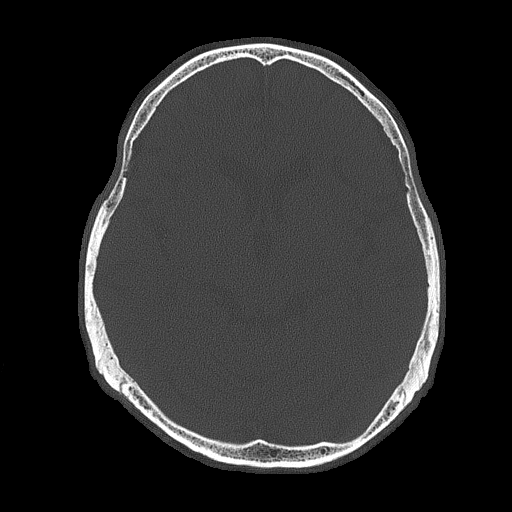

[Series 4: coronal soft tissue · coronal · 0.29mm/px · 3 of 63 slices shown]
[im 21/63  brain]
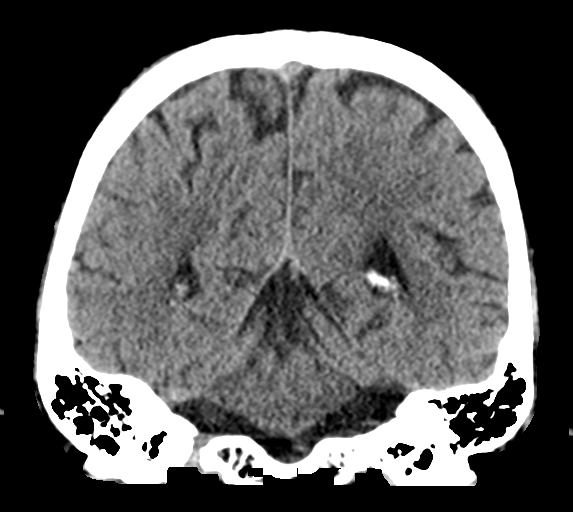
[im 28/63  brain]
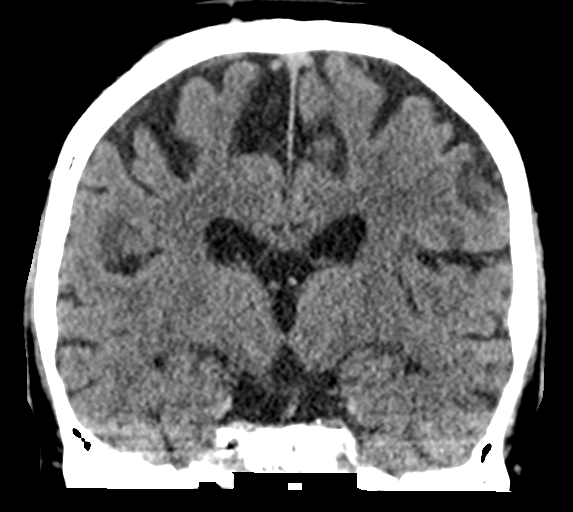
[im 35/63  brain]
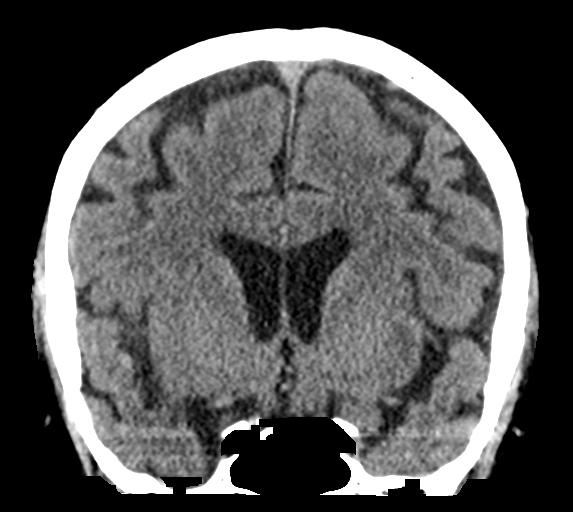

[Series 5: sagittal soft tissue · sagittal · 0.29mm/px · 3 of 57 slices shown]
[im 19/57  brain]
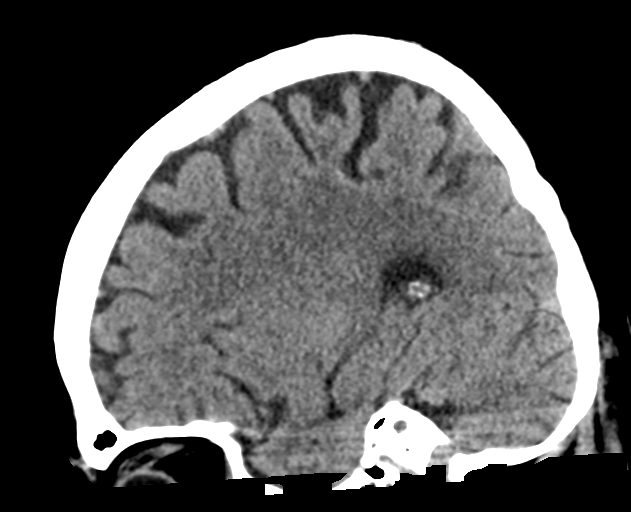
[im 29/57  brain]
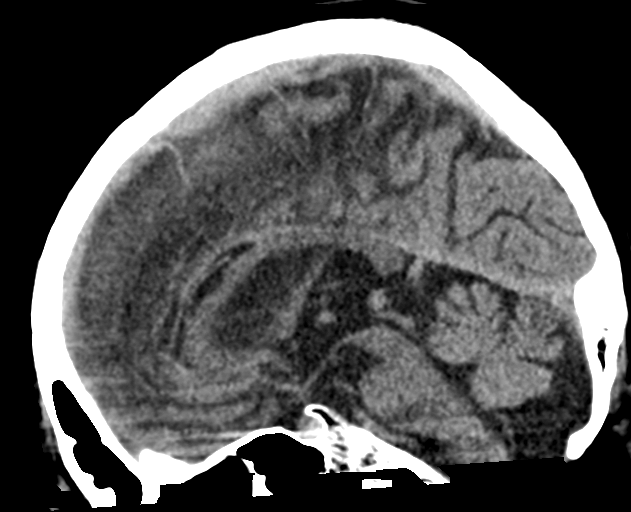
[im 38/57  brain]
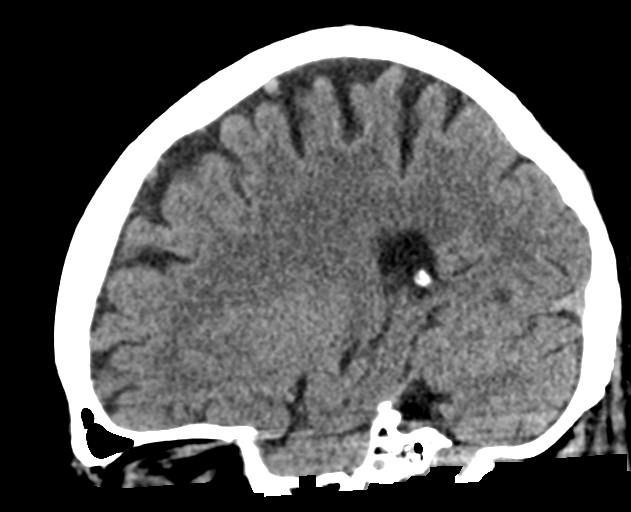

[16 of 47 positions shown; findings below may reference images not displayed]

FINDINGS: Brain: There is no acute intracranial hemorrhage or demarcated
cortical infarction. No evidence of intracranial mass. No midline
shift or extra-axial fluid collection. Mild generalized parenchymal
atrophy.

Vascular: No hyperdense vessel.

Skull: No calvarial fracture.

Sinuses/Orbits: Imaged globes and orbits demonstrate no acute
abnormality. Mild ethmoid sinus mucosal thickening at the imaged
levels. No significant mastoid effusion at the imaged levels.
IMPRESSION: No evidence of acute intracranial abnormality.

Mild generalized parenchymal atrophy.

## 2018-09-21 MED ORDER — LIDOCAINE 5 % EX PTCH: 1.0000 | MEDICATED_PATCH | Freq: Two times a day (BID) | CUTANEOUS | 0 refills | Status: AC

## 2018-09-21 MED ORDER — KETOROLAC TROMETHAMINE 30 MG/ML IJ SOLN
15.0000 mg | Freq: Once | INTRAMUSCULAR | Status: AC
  Administered 2018-09-21: 17:00:00 15 mg via INTRAVENOUS
  Filled 2018-09-21: qty 1

## 2018-09-21 MED ORDER — SODIUM CHLORIDE 0.9 % IV BOLUS
1000.0000 mL | Freq: Once | INTRAVENOUS | Status: AC
  Administered 2018-09-21: 16:00:00 1000 mL via INTRAVENOUS

## 2018-09-21 MED ORDER — NAPROXEN 375 MG PO TABS: 375.0000 mg | ORAL_TABLET | Freq: Two times a day (BID) | ORAL | 0 refills | Status: AC

## 2018-09-21 NOTE — ED Notes (Signed)
Pt ambulated to toilet, steady gail, NAD noted.
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
## 2018-09-21 NOTE — ED Notes (Signed)
Pt given sandwich tray 
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
## 2018-09-21 NOTE — ED Notes (Signed)
Pt appears to be resting, in NAD.

## 2018-09-21 NOTE — ED Notes (Signed)
Date and time results received: 09/21/18 4:34 PM  Test: ETOH Critical Value: 327  Name of Provider Notified: Dr. Ellender Hose

## 2019-07-20 ENCOUNTER — Other Ambulatory Visit: Payer: Self-pay

## 2019-07-20 ENCOUNTER — Emergency Department (HOSPITAL_COMMUNITY)
Admission: EM | Admit: 2019-07-20 | Discharge: 2019-07-21 | Disposition: A | Discharge status: Home / Self Care | Payer: Self-pay | Attending: Emergency Medicine | Admitting: Emergency Medicine

## 2019-07-20 DIAGNOSIS — Y9301 Activity, walking, marching and hiking: Secondary | ICD-10-CM | POA: Insufficient documentation

## 2019-07-20 DIAGNOSIS — W540XXA Bitten by dog, initial encounter: Secondary | ICD-10-CM | POA: Insufficient documentation

## 2019-07-20 DIAGNOSIS — Y999 Unspecified external cause status: Secondary | ICD-10-CM | POA: Insufficient documentation

## 2019-07-20 DIAGNOSIS — T148XXA Other injury of unspecified body region, initial encounter: Secondary | ICD-10-CM

## 2019-07-20 DIAGNOSIS — Y92009 Unspecified place in unspecified non-institutional (private) residence as the place of occurrence of the external cause: Secondary | ICD-10-CM | POA: Insufficient documentation

## 2019-07-20 DIAGNOSIS — F10920 Alcohol use, unspecified with intoxication, uncomplicated: Secondary | ICD-10-CM

## 2019-07-20 DIAGNOSIS — S81851A Open bite, right lower leg, initial encounter: Secondary | ICD-10-CM | POA: Insufficient documentation

## 2019-07-20 DIAGNOSIS — F10129 Alcohol abuse with intoxication, unspecified: Secondary | ICD-10-CM | POA: Insufficient documentation

## 2019-07-20 MED ORDER — RABIES VACCINE, PCEC IM SUSR
1.0000 mL | Freq: Once | INTRAMUSCULAR | Status: AC
  Administered 2019-07-20: 1 mL via INTRAMUSCULAR
  Filled 2019-07-20: qty 1

## 2019-07-20 MED ORDER — LIDOCAINE HCL (PF) 1 % IJ SOLN
5.0000 mL | Freq: Once | INTRAMUSCULAR | Status: AC
  Administered 2019-07-20: 5 mL
  Filled 2019-07-20: qty 30

## 2019-07-20 MED ORDER — RABIES IMMUNE GLOBULIN 150 UNIT/ML IM INJ
1425.0000 [IU] | INJECTION | Freq: Once | INTRAMUSCULAR | Status: AC
  Administered 2019-07-20: 1425 [IU] via INTRAMUSCULAR
  Filled 2019-07-20: qty 9.5

## 2019-07-20 NOTE — ED Provider Notes (Signed)
[facility]-EMERGENCY DEPT Provider Note   CSN: [id number] Arrival date & time: 07/20/19  1704     History Chief Complaint  Patient presents with  . Alcohol Intoxication    [name] is a 49 y.o. male.  Patient is a 49 year old male who has a history of alcohol abuse who presents with alcohol intoxication.  He states that he drinks 2 of the large beers today.  He says he has not been drinking about the last 14 days.  He says he is hungry but denies any other complaints.  No recent falls or head injuries.  No vomiting.  No abdominal pain.  No chest pain.  He asked for something to eat because he says he does not have any money.        Past Medical History:  Diagnosis Date  . Alcohol use     Patient Active Problem List   Diagnosis Date Noted  . Pancreatitis 06/17/2018    No past surgical history on file.     No family history on file.  Social History   Tobacco Use  . Smoking status: Never Smoker  . Smokeless tobacco: Never Used  Substance Use Topics  . Alcohol use: Yes    Alcohol/week: 12.0 standard drinks    Types: 12 Cans of beer per week    Comment: Pt drinks 1 pack/ day  . Drug use: No    Home Medications Prior to Admission medications   Not on File    Allergies    Patient has no known allergies.  Review of Systems   Review of Systems  Constitutional: Negative for chills, diaphoresis, fatigue and fever.  HENT: Negative for congestion, rhinorrhea and sneezing.   Eyes: Negative.   Respiratory: Negative for cough, chest tightness and shortness of breath.   Cardiovascular: Negative for chest pain and leg swelling.  Gastrointestinal: Negative for abdominal pain, blood in stool, diarrhea, nausea and vomiting.  Genitourinary: Negative for difficulty urinating, flank pain, frequency and hematuria.  Musculoskeletal: Negative for arthralgias and back pain.  Skin: Negative for rash.  Neurological: Negative for dizziness, speech  difficulty, weakness, numbness and headaches.    Physical Exam Updated Vital Signs BP (!) 128/92   Pulse 84   Temp 98.3 F (36.8 C) (Oral)   Resp 20   Ht 4\' 8"  (1.422 m)   Wt 70 kg   SpO2 98%   BMI 34.60 kg/m   Physical Exam Constitutional:      Appearance: He is well-developed.     Comments: Disheveled  HENT:     Head: Normocephalic and atraumatic.  Eyes:     Pupils: Pupils are equal, round, and reactive to light.  Cardiovascular:     Rate and Rhythm: Normal rate and regular rhythm.     Heart sounds: Normal heart sounds.  Pulmonary:     Effort: Pulmonary effort is normal. No respiratory distress.     Breath sounds: Normal breath sounds. No wheezing or rales.  Chest:     Chest wall: No tenderness.  Abdominal:     General: Bowel sounds are normal.     Palpations: Abdomen is soft.     Tenderness: There is no abdominal tenderness. There is no guarding or rebound.  Musculoskeletal:        General: Normal range of motion.     Cervical back: Normal range of motion and neck supple.  Lymphadenopathy:     Cervical: No cervical adenopathy.  Skin:    General:  Skin is warm and dry.     Findings: No rash.  Neurological:     Mental Status: He is alert and oriented to person, place, and time.     ED Results / Procedures / Treatments   Labs (all labs ordered are listed, but only abnormal results are displayed) Labs Reviewed - No data to display  EKG None  Radiology No results found.  Procedures .Marland KitchenIncision and Drainage  Date/Time: 07/20/2019 11:24 PM Performed by: Rolan Bucco, MD Authorized by: Rolan Bucco, MD   Consent:    Consent obtained:  Verbal   Consent given by:  Patient   Risks discussed:  Bleeding, incomplete drainage and infection   Alternatives discussed:  No treatment Location:    Type:  Abscess   Size:  2   Location:  Lower extremity   Lower extremity location:  Leg   Leg location:  R lower leg Pre-procedure details:    Skin preparation:   Betadine Anesthesia (see MAR for exact dosages):    Anesthesia method:  Local infiltration   Local anesthetic:  Lidocaine 1% w/o epi Procedure type:    Complexity:  Simple Procedure details:    Incision types:  Stab incision   Incision depth:  Dermal   Scalpel blade:  11   Wound management:  Probed and deloculated   Drainage:  Bloody   Drainage amount:  Moderate   Wound treatment:  Wound left open   Packing materials:  None Post-procedure details:    Patient tolerance of procedure:  Tolerated well, no immediate complications Comments:     Dark hematoma evacuated   (including critical care time)  Medications Ordered in ED Medications  rabies immune globulin (HYPERAB/KEDRAB) injection 1,425 Units (has no administration in time range)  rabies vaccine (RABAVERT) injection 1 mL (has no administration in time range)  lidocaine (PF) (XYLOCAINE) 1 % injection 5 mL (5 mLs Infiltration Given by Other 07/20/19 2258)    ED Course  I have reviewed the triage vital signs and the nursing notes.  Pertinent labs & imaging results that were available during my care of the patient were reviewed by me and considered in my medical decision making (see chart for details).    MDM Rules/Calculators/A&P                          Patient is a 49 year old male who presents with alcohol intoxication.  He was allowed to sober up and this point is ambulating without ataxia and talking coherently.  Spanish interpreter was used for assessment and discharge.  He did note that he had a dog bite to his right lower leg about a week ago.  He does not have any information about the dog.  It happened while he was walking the streets.  He has what appears to be a possible abscess to his lower leg.  There is no warmth or erythema.  No signs of cellulitis.  He states that there was some pus that he expressed from it after he poked a toothpick and it a couple days ago.  Given this, I did try to open it with by I&D.  There  was no evidence of infection on this.  There was a moderate hematoma that was evacuated.  Dressing was applied.  He was started on his rabies vaccines.  Return precautions were given. Final Clinical Impression(s) / ED Diagnoses Final diagnoses:  Alcoholic intoxication without complication (HCC)  Dog bite, initial encounter  Hematoma    Rx / DC Orders ED Discharge Orders         Ordered    Administer rabies vaccine     Discontinue   07/20/19 2321           Rolan Bucco, MD 07/20/19 2327
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
## 2019-08-27 ENCOUNTER — Other Ambulatory Visit: Payer: Self-pay

## 2019-08-27 ENCOUNTER — Emergency Department (HOSPITAL_COMMUNITY)
Admission: EM | Admit: 2019-08-27 | Discharge: 2019-08-27 | Disposition: A | Discharge status: Home / Self Care | Payer: Self-pay | Attending: Emergency Medicine | Admitting: Emergency Medicine

## 2019-08-27 DIAGNOSIS — F10920 Alcohol use, unspecified with intoxication, uncomplicated: Secondary | ICD-10-CM

## 2019-08-27 DIAGNOSIS — Y907 Blood alcohol level of 200-239 mg/100 ml: Secondary | ICD-10-CM | POA: Insufficient documentation

## 2019-08-27 DIAGNOSIS — F10129 Alcohol abuse with intoxication, unspecified: Secondary | ICD-10-CM | POA: Insufficient documentation

## 2019-08-27 LAB — BASIC METABOLIC PANEL
Anion gap: 9 (ref 5–15)
BUN: 7 mg/dL (ref 6–20)
CO2: 22 mmol/L (ref 22–32)
Calcium: 8 mg/dL — ABNORMAL LOW (ref 8.9–10.3)
Chloride: 107 mmol/L (ref 98–111)
Creatinine, Ser: 0.58 mg/dL — ABNORMAL LOW (ref 0.61–1.24)
GFR calc Af Amer: >60 mL/min (ref >60)
GFR calc non Af Amer: >60 mL/min (ref >60)
Glucose, Bld: 106 mg/dL — ABNORMAL HIGH (ref 70–99)
Potassium: 3.5 mmol/L (ref 3.5–5.1)
Sodium: 138 mmol/L (ref 135–145)

## 2019-08-27 LAB — CBC WITH DIFFERENTIAL/PLATELET
Abs Immature Granulocytes: 0.01 10*3/uL (ref 0.00–0.07)
Basophils Absolute: 0 10*3/uL (ref 0.0–0.1)
Basophils Relative: 1 %
Eosinophils Absolute: 0.1 10*3/uL (ref 0.0–0.5)
Eosinophils Relative: 4 %
HCT: 32.2 % — ABNORMAL LOW (ref 39.0–52.0)
Hemoglobin: 10.6 g/dL — ABNORMAL LOW (ref 13.0–17.0)
Immature Granulocytes: 0 %
Lymphocytes Relative: 24 %
Lymphs Abs: 0.8 10*3/uL (ref 0.7–4.0)
MCH: 31.8 pg (ref 26.0–34.0)
MCHC: 32.9 g/dL (ref 30.0–36.0)
MCV: 96.7 fL (ref 80.0–100.0)
Monocytes Absolute: 0.3 10*3/uL (ref 0.1–1.0)
Monocytes Relative: 10 %
Neutro Abs: 2.1 10*3/uL (ref 1.7–7.7)
Neutrophils Relative %: 61 %
Platelets: 128 10*3/uL — ABNORMAL LOW (ref 150–400)
RBC: 3.33 MIL/uL — ABNORMAL LOW (ref 4.22–5.81)
RDW: 14.4 % (ref 11.5–15.5)
WBC: 3.4 10*3/uL — ABNORMAL LOW (ref 4.0–10.5)
nRBC: 0 % (ref 0.0–0.2)

## 2019-08-27 LAB — ETHANOL: Alcohol, Ethyl (B): 436 mg/dL (ref <10)

## 2019-08-27 NOTE — ED Triage Notes (Signed)
Pt arrived by EMS.
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
## 2019-08-27 NOTE — ED Notes (Signed)
Pt ambulatory to bathroom, one person assistance, staggered gait.

## 2019-08-27 NOTE — Progress Notes (Signed)
CSW received a call from pt's RN stating pt needed transport, could not speak English and thus would be unable to navigate the bus route successfully and the CSW arranged [employer].  RN voiced understanding.  CSW will continue to follow for D/C needs.  Samuel [name]. Shomari Matusik  MSW, LCSW, LCAS, CCS Transitions of Care Clinical Social Worker Care Coordination Department Ph: [phone]

## 2019-08-27 NOTE — ED Notes (Signed)
Pt accompanied by NT to ED entrance to meet with Benedetto Goad arranged by CSW.

## 2019-08-27 NOTE — ED Notes (Addendum)
Date and time results received: 08/27/19 4:12 PM  Test: ETOH Critical Value: 436  Name of Provider Notified: Madilyn Hook MD  Orders Received? Or Actions Taken?: Actions Taken: MD made aware
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
## 2019-08-27 NOTE — ED Provider Notes (Signed)
Patient care assumed at 1500. Patient with history of alcohol abuse presented to the emergency department with intoxication. Labs significant for elevated alcohol level. He was observed in the emergency department with no complaints. He is able to ambulate with a steady gait. Plan to discharge home with outpatient resources.   Tilden Fossa, MD 08/27/19 1943

## 2019-08-27 NOTE — ED Notes (Signed)
Pt with requests to be transported to [facility] on [facility].  Pt reports being unsure of his exact home address, but [place] to mentioned restaurant.  EDP Madilyn Hook made aware.

## 2019-08-28 ENCOUNTER — Emergency Department (HOSPITAL_COMMUNITY): Payer: Self-pay

## 2019-08-28 ENCOUNTER — Emergency Department (HOSPITAL_COMMUNITY)
Admission: EM | Admit: 2019-08-28 | Discharge: 2019-08-28 | Disposition: A | Discharge status: Home / Self Care | Payer: Self-pay | Attending: Emergency Medicine | Admitting: Emergency Medicine

## 2019-08-28 DIAGNOSIS — Y999 Unspecified external cause status: Secondary | ICD-10-CM | POA: Insufficient documentation

## 2019-08-28 DIAGNOSIS — S0081XA Abrasion of other part of head, initial encounter: Secondary | ICD-10-CM | POA: Insufficient documentation

## 2019-08-28 DIAGNOSIS — F10920 Alcohol use, unspecified with intoxication, uncomplicated: Secondary | ICD-10-CM

## 2019-08-28 DIAGNOSIS — X58XXXA Exposure to other specified factors, initial encounter: Secondary | ICD-10-CM | POA: Insufficient documentation

## 2019-08-28 DIAGNOSIS — R4182 Altered mental status, unspecified: Secondary | ICD-10-CM | POA: Insufficient documentation

## 2019-08-28 DIAGNOSIS — Y929 Unspecified place or not applicable: Secondary | ICD-10-CM | POA: Insufficient documentation

## 2019-08-28 DIAGNOSIS — Y939 Activity, unspecified: Secondary | ICD-10-CM | POA: Insufficient documentation

## 2019-08-28 LAB — COMPREHENSIVE METABOLIC PANEL
ALT: 43 U/L (ref 0–44)
AST: 76 U/L — ABNORMAL HIGH (ref 15–41)
Albumin: 3.8 g/dL (ref 3.5–5.0)
Alkaline Phosphatase: 92 U/L (ref 38–126)
Anion gap: 14 (ref 5–15)
BUN: 9 mg/dL (ref 6–20)
CO2: 20 mmol/L — ABNORMAL LOW (ref 22–32)
Calcium: 8.5 mg/dL — ABNORMAL LOW (ref 8.9–10.3)
Chloride: 108 mmol/L (ref 98–111)
Creatinine, Ser: 0.66 mg/dL (ref 0.61–1.24)
GFR calc Af Amer: >60 mL/min (ref >60)
GFR calc non Af Amer: >60 mL/min (ref >60)
Glucose, Bld: 118 mg/dL — ABNORMAL HIGH (ref 70–99)
Potassium: 3.8 mmol/L (ref 3.5–5.1)
Sodium: 142 mmol/L (ref 135–145)
Total Bilirubin: 0.5 mg/dL (ref 0.3–1.2)
Total Protein: 7 g/dL (ref 6.5–8.1)

## 2019-08-28 LAB — ACETAMINOPHEN LEVEL: Acetaminophen (Tylenol), Serum: <10 ug/mL — ABNORMAL LOW (ref 10–30)

## 2019-08-28 LAB — PROTIME-INR
INR: 1.1 (ref 0.8–1.2)
Prothrombin Time: 13.5 seconds (ref 11.4–15.2)

## 2019-08-28 LAB — SALICYLATE LEVEL: Salicylate Lvl: <7 mg/dL — ABNORMAL LOW (ref 7.0–30.0)

## 2019-08-28 LAB — CBC
HCT: 34.8 % — ABNORMAL LOW (ref 39.0–52.0)
Hemoglobin: 11.5 g/dL — ABNORMAL LOW (ref 13.0–17.0)
MCH: 32 pg (ref 26.0–34.0)
MCHC: 33 g/dL (ref 30.0–36.0)
MCV: 96.9 fL (ref 80.0–100.0)
Platelets: 149 10*3/uL — ABNORMAL LOW (ref 150–400)
RBC: 3.59 MIL/uL — ABNORMAL LOW (ref 4.22–5.81)
RDW: 14.7 % (ref 11.5–15.5)
WBC: 5.2 10*3/uL (ref 4.0–10.5)
nRBC: 0 % (ref 0.0–0.2)

## 2019-08-28 LAB — RAPID URINE DRUG SCREEN, HOSP PERFORMED
Amphetamines: NOT DETECTED
Barbiturates: NOT DETECTED
Benzodiazepines: NOT DETECTED
Cocaine: NOT DETECTED
Opiates: NOT DETECTED
Tetrahydrocannabinol: NOT DETECTED

## 2019-08-28 LAB — ETHANOL: Alcohol, Ethyl (B): 462 mg/dL (ref <10)

## 2019-08-28 MED ORDER — SODIUM CHLORIDE 0.9 % IV BOLUS
1000.0000 mL | Freq: Once | INTRAVENOUS | Status: AC
  Administered 2019-08-28: 1000 mL via INTRAVENOUS
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
## 2019-08-28 NOTE — ED Notes (Signed)
Date and time results received: 08/28/19  Test: Alcohol  Critical Value: 462 mg/dl  Name of Provider Notified: Silverio Lay MD

## 2019-08-28 NOTE — Social Work (Signed)
CSW met with Pt at bedside with the help of [employer] interpretation services and interpreter [phone].  Although Pt had previously stated that he wished to go to detox, at this point Pt declines detox services.  CSW provided substance use counseling and discussed the seriousness of the Pt's case.  Pt again declined detox and stated that he wished to go home.  Bus pass was provided.

## 2019-08-28 NOTE — Progress Notes (Addendum)
Please call Acie Fredrickson CSW  When discharged so she can call safe transport for patient. [phone]

## 2019-08-28 NOTE — ED Provider Notes (Signed)
  Physical Exam  BP 106/78   Pulse 89   Temp 97.6 F (36.4 C)   Resp 16   Ht 6' (1.829 m)   Wt 81.6 kg   SpO2 97%   BMI 24.41 kg/m   Physical Exam  ED Course/Procedures     Procedures  MDM  Patient care assumed at 3 pm. Patient intoxicated and CT head unremarkable. Sign out pending alcohol level.   5 pm ETOH is 400. UDS negative. Patient wants detox. Talked to case management who was able to get him to [facility] when sober   10:07 PM Patient is now sober. Now he refuses detox.  Stable for discharge.  Told him to stop drinking alcohol.    Charlynne Pander, MD 08/28/19 [phone]

## 2019-08-28 NOTE — TOC Initial Note (Signed)
Transition of Care [facility]) - Initial/Assessment Note    Patient Details  Name: [name] MRN: [id number] Date of Birth: 07-10-1970  [facility]) CM/SW Contact:    Lockie Pares, RN Phone Number: 08/28/2019, 4:53 PM  Clinical Narrative:                 Admitted  To Ed for intoxication. Spoke to patient, he is interested in detoxing. Utilized interpretation for exchange. He does not have any emergency contacts . Called [facility] at [facility], patient does not have any insurance. They stated they could take him ,.They understand he is spanish speaking they have a language line. And interpreters Monday through Friday.  Plan to transport to [facility] in [place] when ready to discharge. Dr Silverio Lay made aware.     Barriers to Discharge: Homeless with medical needs   Patient Goals and CMS Choice Patient states their goals for this hospitalization and ongoing recovery are:: patient unable to articulate at this time.      Expected Discharge Plan and Services                                                Prior Living Arrangements/Services                       Activities of Daily Living      Permission Sought/Granted                  Emotional Assessment              Admission diagnosis:  Fall There are no problems to display for this patient.  PCP:  Patient, No Pcp Per Pharmacy:  No Pharmacies Listed    Social Determinants of Health (SDOH) Interventions    Readmission Risk Interventions No flowsheet data found.

## 2019-08-28 NOTE — ED Notes (Signed)
Patient tolerating food and liquids.  

## 2019-08-28 NOTE — ED Provider Notes (Signed)
[facility]-EMERGENCY DEPT [facility] Emergency Department Provider Note MRN:  [id number]  Arrival date & time: 08/28/19     Chief Complaint   Level 2 trauma History of Present Illness   Samuel [name] is a 49 y.o. year-old male with unknown past medical history presenting to the ED with chief complaint of level 2 trauma.   Patient found down with abrasions to the forehead, altered mental status.  I was unable to obtain an accurate HPI, PMH, or ROS due to the patient's altered mental status.  Level 5 caveat.  Review of Systems  Positive for abrasions, altered mental status.  Patient's Health History   No past medical history on file.    No family history on file.  Social History   Socioeconomic History  . Marital status: Single    Spouse name: Not on file  . Number of children: Not on file  . Years of education: Not on file  . Highest education level: Not on file  Occupational History  . Not on file  Tobacco Use  . Smoking status: Not on file  Substance and Sexual Activity  . Alcohol use: Not on file  . Drug use: Not on file  . Sexual activity: Not on file  Other Topics Concern  . Not on file  Social History Narrative  . Not on file   Social Determinants of Health   Financial Resource Strain:   . Difficulty of Paying Living Expenses:   Food Insecurity:   . Worried About Programme researcher, broadcasting/film/video in the Last Year:   . Barista in the Last Year:   Transportation Needs:   . Freight forwarder (Medical):   Marland Kitchen Lack of Transportation (Non-Medical):   Physical Activity:   . Days of Exercise per Week:   . Minutes of Exercise per Session:   Stress:   . Feeling of Stress :   Social Connections:   . Frequency of Communication with Friends and Family:   . Frequency of Social Gatherings with Friends and Family:   . Attends Religious Services:   . Active Member of Clubs or Organizations:   . Attends Banker Meetings:   Marland Kitchen Marital Status:   Intimate  Partner Violence:   . Fear of Current or Ex-Partner:   . Emotionally Abused:   Marland Kitchen Physically Abused:   . Sexually Abused:      Physical Exam   Vitals:   08/28/19 1407 08/28/19 1408  BP: 120/82   Pulse:  (!) 103  Resp:  (!) 21  Temp:  97.6 F (36.4 C)  SpO2:  95%    CONSTITUTIONAL: Chronically ill-appearing, NAD NEURO: Somnolent, wakes to voice, oriented to name, moves all extremities EYES:  eyes equal and reactive ENT/NECK:  no LAD, no JVD CARDIO: Regular rate, well-perfused, normal S1 and S2 PULM:  CTAB no wheezing or rhonchi GI/GU:  normal bowel sounds, non-distended, non-tender MSK/SPINE:  No gross deformities, no edema SKIN: Scattered abrasions to the forehead PSYCH:  Appropriate speech and behavior  *Additional and/or pertinent findings included in MDM below  Diagnostic and Interventional Summary    EKG Interpretation  Date/Time:  Wednesday August 28 2019 14:20:53 EDT Ventricular Rate:  114 PR Interval:    QRS Duration: 98 QT Interval:  332 QTC Calculation: 458 R Axis:   72 Text Interpretation: Sinus tachycardia Probable left ventricular hypertrophy Confirmed by Kennis Carina [phone]) on 08/28/2019 2:23:21 PM      [name] Reviewed  ETHANOL  ACETAMINOPHEN  LEVEL  SALICYLATE LEVEL  CBC  COMPREHENSIVE METABOLIC PANEL  PROTIME-INR    CT HEAD WO CONTRAST    (Results Pending)  CT CERVICAL SPINE WO CONTRAST    (Results Pending)  DG Chest Port 1 View    (Results Pending)  DG Pelvis Portable    (Results Pending)    Medications - No data to display   Procedures  /  Critical Care Procedures  ED Course and Medical Decision Making  I have reviewed the triage vital signs, the nursing notes, and pertinent available records from the EMR.  Listed above are laboratory and imaging tests that I personally ordered, reviewed, and interpreted and then considered in my medical decision making (see below for details).      Suspect alcohol intoxication with mild trauma,  patient is altered will obtain CT head to exclude intracranial bleeding.  Presenting as a level 2 trauma due to altered mental status and signs of head trauma, primary survey is reassuring and there is no significant signs of trauma elsewhere on the body.  Also awaiting laboratory assessment.  Will likely need few hours of metabolization.  Signed out to oncoming provider at shift change.    Elmer Sow. Pilar Plate, MD [facility] Emergency [facility] [email or web address]  Final Clinical Impressions(s) / ED Diagnoses     ICD-10-CM   1. Altered mental status, unspecified altered mental status type  R41.82     ED Discharge Orders    None       Discharge Instructions Discussed with and Provided to Patient:   Discharge Instructions   None       Sabas Sous, MD 08/28/19 1423

## 2019-08-28 NOTE — Discharge Instructions (Signed)
Avoid drinking alcohol   See your doctor   Return to ER if you have fall, vomiting, lethargy

## 2019-09-29 ENCOUNTER — Encounter (HOSPITAL_COMMUNITY): Payer: Self-pay

## 2019-09-29 ENCOUNTER — Emergency Department (HOSPITAL_COMMUNITY)
Admission: EM | Admit: 2019-09-29 | Discharge: 2019-09-30 | Disposition: A | Discharge status: Home / Self Care | Payer: Self-pay | Attending: Emergency Medicine | Admitting: Emergency Medicine

## 2019-09-29 ENCOUNTER — Other Ambulatory Visit: Payer: Self-pay

## 2019-09-29 DIAGNOSIS — F10220 Alcohol dependence with intoxication, uncomplicated: Secondary | ICD-10-CM

## 2019-09-29 DIAGNOSIS — Y904 Blood alcohol level of 80-99 mg/100 ml: Secondary | ICD-10-CM | POA: Insufficient documentation

## 2019-09-29 DIAGNOSIS — S7012XA Contusion of left thigh, initial encounter: Secondary | ICD-10-CM

## 2019-09-29 DIAGNOSIS — X58XXXA Exposure to other specified factors, initial encounter: Secondary | ICD-10-CM | POA: Insufficient documentation

## 2019-09-29 DIAGNOSIS — Y929 Unspecified place or not applicable: Secondary | ICD-10-CM | POA: Insufficient documentation

## 2019-09-29 DIAGNOSIS — Y999 Unspecified external cause status: Secondary | ICD-10-CM | POA: Insufficient documentation

## 2019-09-29 DIAGNOSIS — Z23 Encounter for immunization: Secondary | ICD-10-CM | POA: Insufficient documentation

## 2019-09-29 DIAGNOSIS — Y9389 Activity, other specified: Secondary | ICD-10-CM | POA: Insufficient documentation

## 2019-09-29 HISTORY — DX: Alcohol abuse, uncomplicated: F10.10

## 2019-09-29 NOTE — ED Triage Notes (Signed)
Patient arrived with [facility] with complaints of swelling from his left knee up his thigh. [place] and unsure if bit by something yesterday. Also reports ETOH on board.

## 2019-09-30 ENCOUNTER — Encounter (HOSPITAL_COMMUNITY): Payer: Self-pay | Admitting: Emergency Medicine

## 2019-09-30 ENCOUNTER — Emergency Department (HOSPITAL_COMMUNITY): Payer: Self-pay

## 2019-09-30 LAB — CBC WITH DIFFERENTIAL/PLATELET
Abs Immature Granulocytes: 0.01 10*3/uL (ref 0.00–0.07)
Basophils Absolute: 0.1 10*3/uL (ref 0.0–0.1)
Basophils Relative: 1 %
Eosinophils Absolute: 0.2 10*3/uL (ref 0.0–0.5)
Eosinophils Relative: 5 %
HCT: 31.3 % — ABNORMAL LOW (ref 39.0–52.0)
Hemoglobin: 10.4 g/dL — ABNORMAL LOW (ref 13.0–17.0)
Immature Granulocytes: 0 %
Lymphocytes Relative: 19 %
Lymphs Abs: 1 10*3/uL (ref 0.7–4.0)
MCH: 32.7 pg (ref 26.0–34.0)
MCHC: 33.2 g/dL (ref 30.0–36.0)
MCV: 98.4 fL (ref 80.0–100.0)
Monocytes Absolute: 0.8 10*3/uL (ref 0.1–1.0)
Monocytes Relative: 16 %
Neutro Abs: 3.1 10*3/uL (ref 1.7–7.7)
Neutrophils Relative %: 59 %
Platelets: 162 10*3/uL (ref 150–400)
RBC: 3.18 MIL/uL — ABNORMAL LOW (ref 4.22–5.81)
RDW: 14.9 % (ref 11.5–15.5)
WBC: 5.2 10*3/uL (ref 4.0–10.5)
nRBC: 0 % (ref 0.0–0.2)

## 2019-09-30 LAB — BASIC METABOLIC PANEL
Anion gap: 13 (ref 5–15)
BUN: 9 mg/dL (ref 6–20)
CO2: 23 mmol/L (ref 22–32)
Calcium: 8.9 mg/dL (ref 8.9–10.3)
Chloride: 103 mmol/L (ref 98–111)
Creatinine, Ser: 0.56 mg/dL — ABNORMAL LOW (ref 0.61–1.24)
GFR calc Af Amer: >60 mL/min (ref >60)
GFR calc non Af Amer: >60 mL/min (ref >60)
Glucose, Bld: 101 mg/dL — ABNORMAL HIGH (ref 70–99)
Potassium: 3.6 mmol/L (ref 3.5–5.1)
Sodium: 139 mmol/L (ref 135–145)

## 2019-09-30 LAB — ETHANOL: Alcohol, Ethyl (B): 96 mg/dL — ABNORMAL HIGH (ref <10)

## 2019-09-30 IMAGING — CT CT ANGIO EXTREM LOW*L*
2 of 5 series · 11 of 46 positions shown, 12 images · IV contrast (omnipaque)
Comparison: None.

CLINICAL DATA: Soft tissue mass, lower leg, with spontaneous
hemorrhage.

History of recent onset swelling.  Intoxication
EXAM:
CT ANGIOGRAPHY LOWER LEFT EXTREMITY
TECHNIQUE: After bolus administration of iodinated contrast arterially timed CT
of the lower extremities was performed
CONTRAST:  100mL OMNIPAQUE IOHEXOL 350 MG/ML SOLN

[Series 4: axial arterial · axial · arterial · 0.75mm/px · z∈[-979,-103]mm · 8 of 336 slices shown, 9 images]
[im 22/336  soft-tissue]
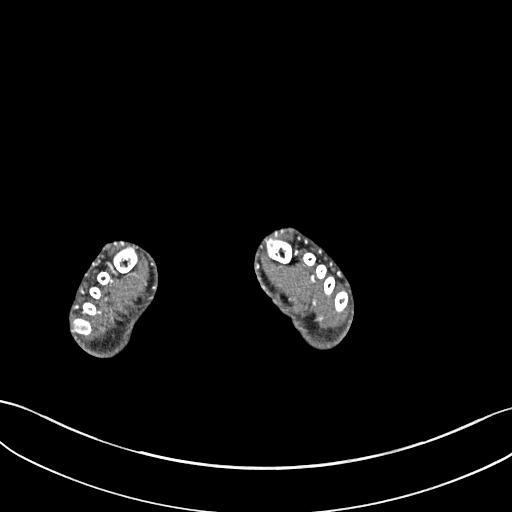
[im 22/336  bone]
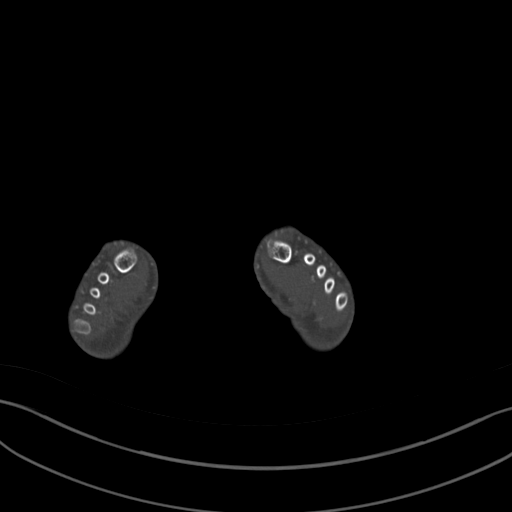
[im 65/336  soft-tissue]
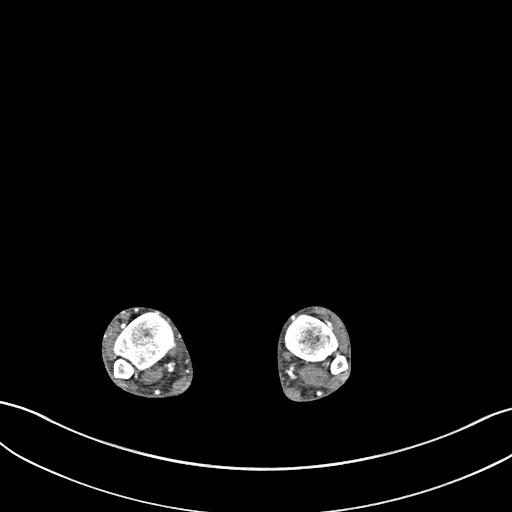
[im 109/336  soft-tissue]
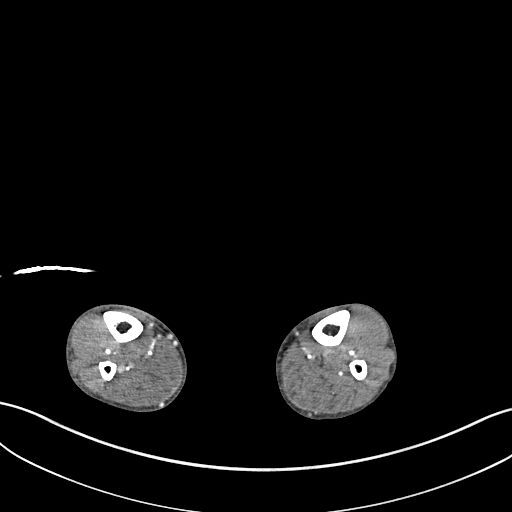
[im 152/336  soft-tissue]
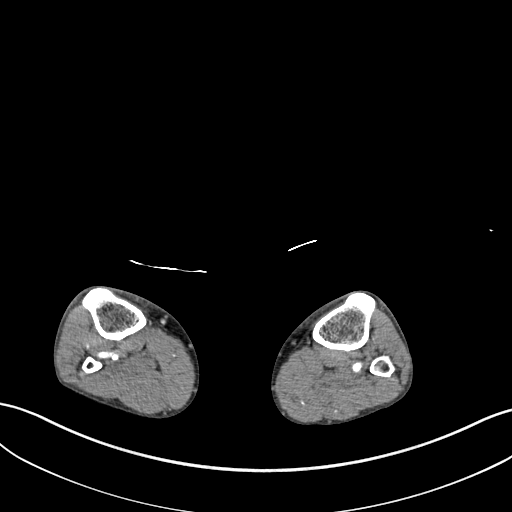
[im 184/336  soft-tissue]
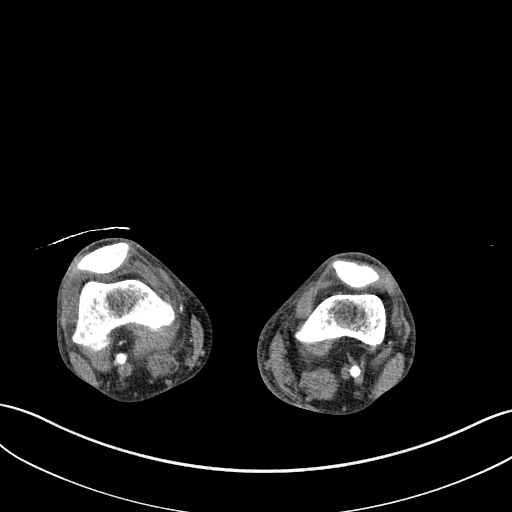
[im 227/336  soft-tissue]
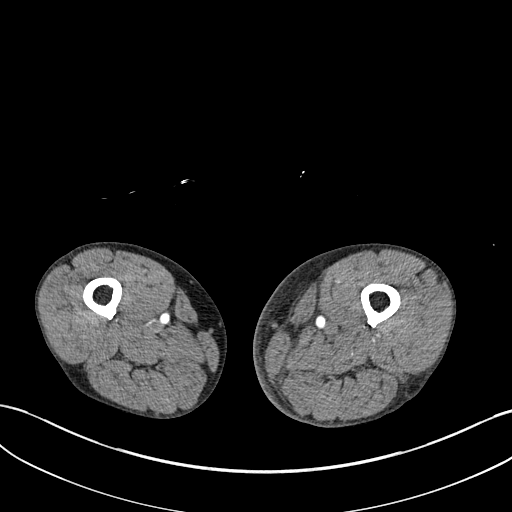
[im 271/336  soft-tissue]
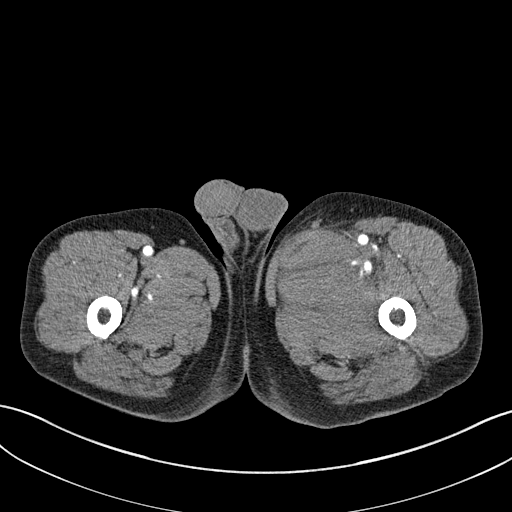
[im 314/336  soft-tissue]
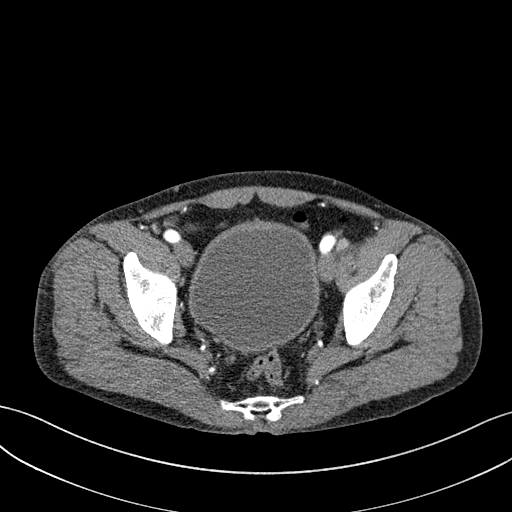

[Series 6: coronals · coronal · 0.58mm/px · 3 of 124 slices shown]
[im 31/124  soft-tissue]
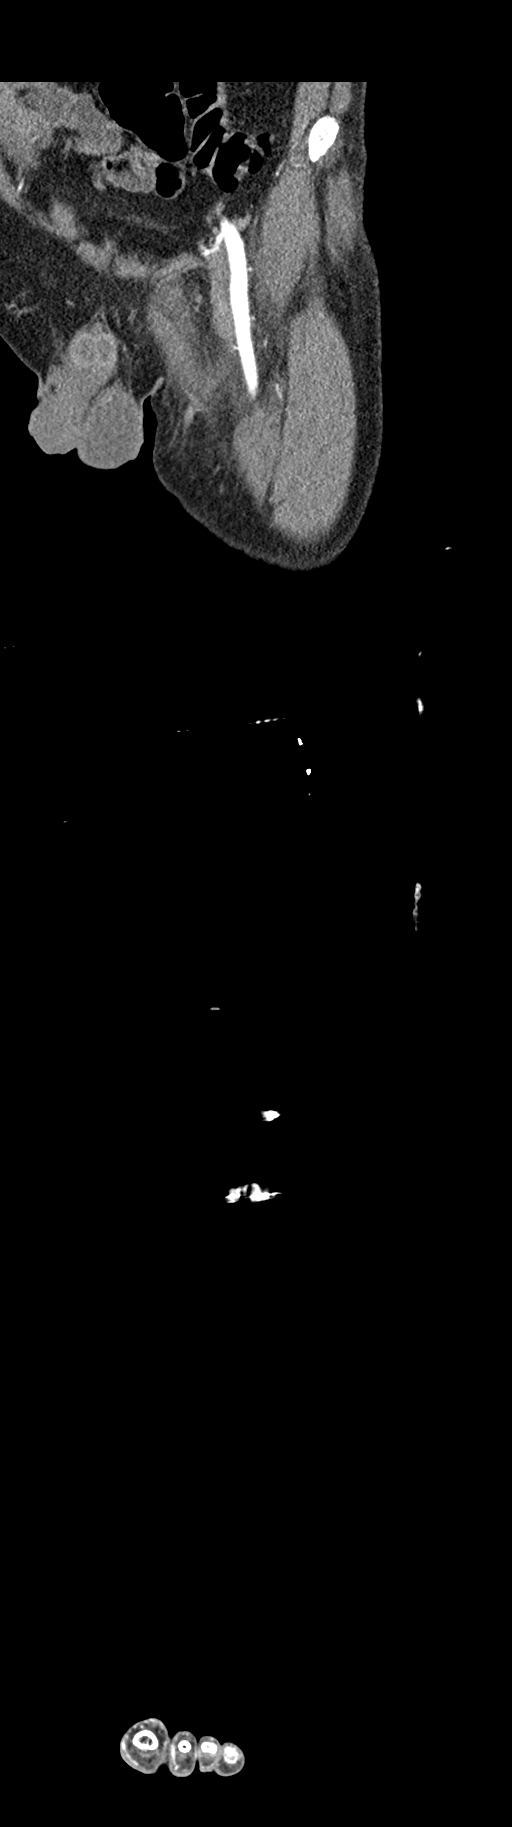
[im 62/124  soft-tissue]
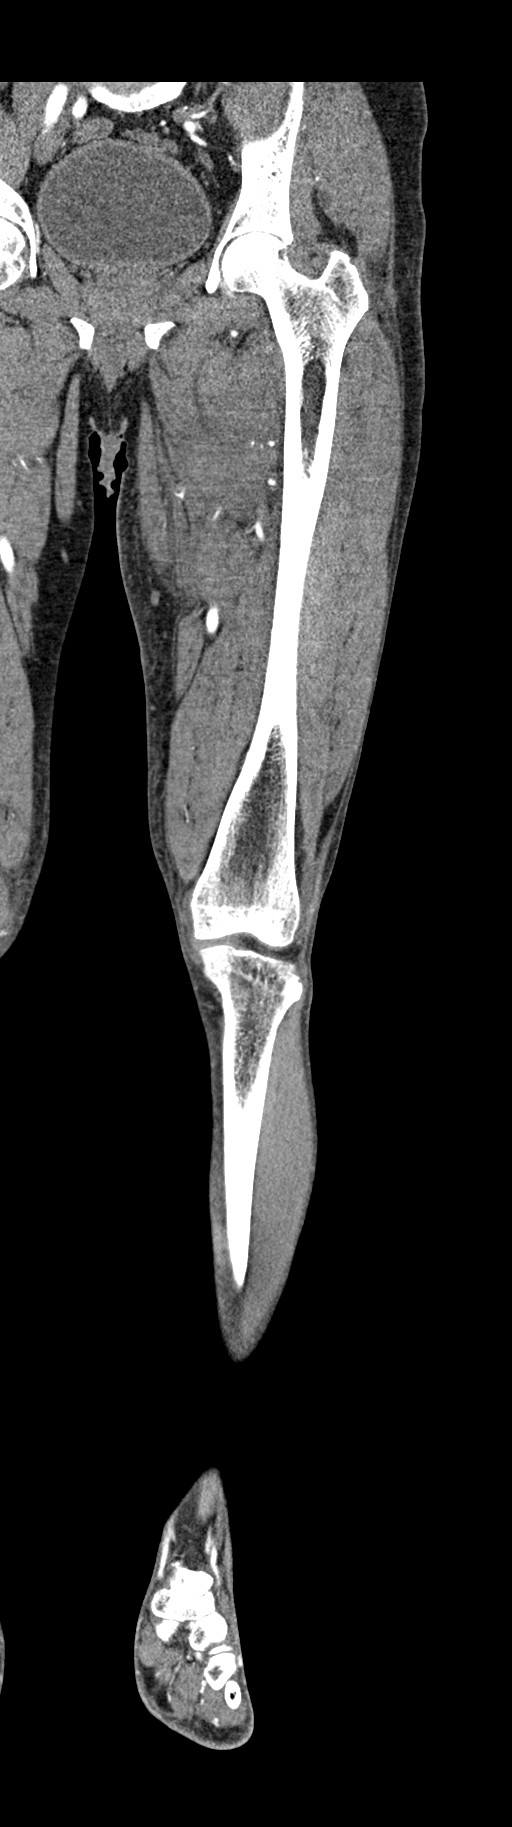
[im 93/124  soft-tissue]
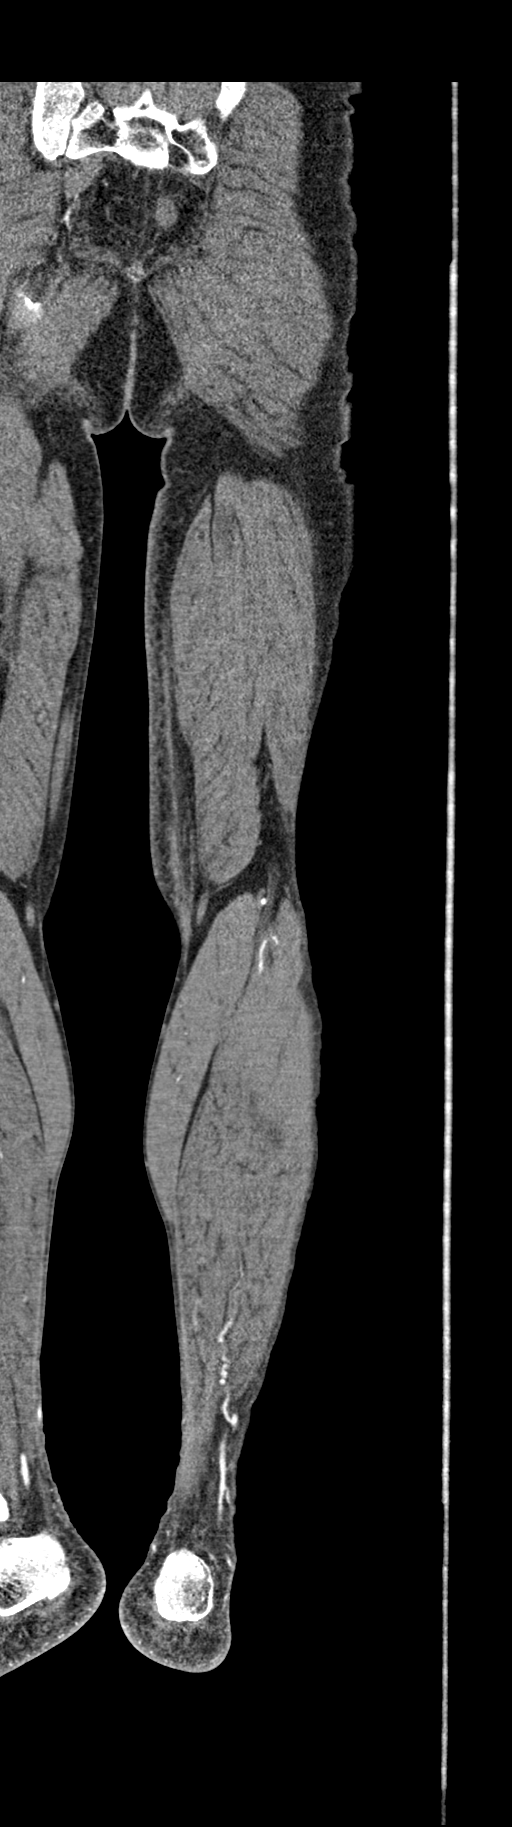

[11 of 46 positions shown; findings below may reference images not displayed]

FINDINGS: Arterial structures are smooth and widely patent on both sides.
Robust three-vessel runoff at the ankles. No atheromatous changes
noted. No evidence of active arterial bleeding.

There is patchy high-density expansion in the medial compartment
upper left thigh with regional fat stranding. Given the acuity of
symptoms this is most consistent with intramuscular hemorrhage. No
visible penetrating wound or osseous trauma.

Review of the MIP images confirms the above findings.
IMPRESSION: 1. High-density swelling in the medial compartment left thigh,
likely intramuscular hemorrhage in the acute setting. Recommend
clinical follow-up to normalization.
2. Unremarkable arterial structures.

## 2019-09-30 MED ORDER — TETANUS-DIPHTH-ACELL PERTUSSIS 5-2.5-18.5 LF-MCG/0.5 IM SUSP
0.5000 mL | Freq: Once | INTRAMUSCULAR | Status: AC
  Administered 2019-09-30: 0.5 mL via INTRAMUSCULAR
  Filled 2019-09-30: qty 0.5

## 2019-09-30 MED ORDER — IOHEXOL 350 MG/ML SOLN
100.0000 mL | Freq: Once | INTRAVENOUS | Status: AC | PRN
  Administered 2019-09-30: 100 mL via INTRAVENOUS
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
## 2019-09-30 NOTE — ED Notes (Signed)
Patient declined crutches

## 2019-09-30 NOTE — ED Notes (Signed)
Patient ambulated out of the department with no difficulty   

## 2020-07-02 ENCOUNTER — Inpatient Hospital Stay (HOSPITAL_COMMUNITY): Payer: Self-pay

## 2020-07-02 ENCOUNTER — Emergency Department (HOSPITAL_COMMUNITY): Payer: Self-pay

## 2020-07-02 ENCOUNTER — Other Ambulatory Visit: Payer: Self-pay

## 2020-07-02 ENCOUNTER — Inpatient Hospital Stay (HOSPITAL_COMMUNITY)
Admission: EM | Admit: 2020-07-02 | Discharge: 2020-07-21 | DRG: 083 | Disposition: A | Discharge status: Home / Self Care | Payer: Self-pay | Attending: Surgery | Admitting: Surgery

## 2020-07-02 ENCOUNTER — Encounter (HOSPITAL_COMMUNITY): Payer: Self-pay

## 2020-07-02 DIAGNOSIS — S22069A Unspecified fracture of T7-T8 vertebra, initial encounter for closed fracture: Secondary | ICD-10-CM | POA: Diagnosis present

## 2020-07-02 DIAGNOSIS — S42001A Fracture of unspecified part of right clavicle, initial encounter for closed fracture: Secondary | ICD-10-CM | POA: Diagnosis present

## 2020-07-02 DIAGNOSIS — R7989 Other specified abnormal findings of blood chemistry: Secondary | ICD-10-CM | POA: Diagnosis present

## 2020-07-02 DIAGNOSIS — Z9119 Patient's noncompliance with other medical treatment and regimen: Secondary | ICD-10-CM

## 2020-07-02 DIAGNOSIS — Y908 Blood alcohol level of 240 mg/100 ml or more: Secondary | ICD-10-CM | POA: Diagnosis present

## 2020-07-02 DIAGNOSIS — F10129 Alcohol abuse with intoxication, unspecified: Secondary | ICD-10-CM | POA: Diagnosis present

## 2020-07-02 DIAGNOSIS — S22079A Unspecified fracture of T9-T10 vertebra, initial encounter for closed fracture: Secondary | ICD-10-CM | POA: Diagnosis present

## 2020-07-02 DIAGNOSIS — Z781 Physical restraint status: Secondary | ICD-10-CM

## 2020-07-02 DIAGNOSIS — T1490XA Injury, unspecified, initial encounter: Secondary | ICD-10-CM

## 2020-07-02 DIAGNOSIS — R52 Pain, unspecified: Secondary | ICD-10-CM

## 2020-07-02 DIAGNOSIS — Z20822 Contact with and (suspected) exposure to covid-19: Secondary | ICD-10-CM | POA: Diagnosis present

## 2020-07-02 DIAGNOSIS — R402142 Coma scale, eyes open, spontaneous, at arrival to emergency department: Secondary | ICD-10-CM | POA: Diagnosis present

## 2020-07-02 DIAGNOSIS — R739 Hyperglycemia, unspecified: Secondary | ICD-10-CM | POA: Diagnosis present

## 2020-07-02 DIAGNOSIS — S066X9A Traumatic subarachnoid hemorrhage with loss of consciousness of unspecified duration, initial encounter: Principal | ICD-10-CM | POA: Diagnosis present

## 2020-07-02 DIAGNOSIS — S22009A Unspecified fracture of unspecified thoracic vertebra, initial encounter for closed fracture: Secondary | ICD-10-CM

## 2020-07-02 DIAGNOSIS — R402362 Coma scale, best motor response, obeys commands, at arrival to emergency department: Secondary | ICD-10-CM | POA: Diagnosis present

## 2020-07-02 DIAGNOSIS — W19XXXA Unspecified fall, initial encounter: Secondary | ICD-10-CM | POA: Diagnosis present

## 2020-07-02 DIAGNOSIS — Z23 Encounter for immunization: Secondary | ICD-10-CM

## 2020-07-02 DIAGNOSIS — S42101A Fracture of unspecified part of scapula, right shoulder, initial encounter for closed fracture: Secondary | ICD-10-CM | POA: Diagnosis present

## 2020-07-02 DIAGNOSIS — E876 Hypokalemia: Secondary | ICD-10-CM | POA: Diagnosis present

## 2020-07-02 DIAGNOSIS — M542 Cervicalgia: Secondary | ICD-10-CM | POA: Diagnosis present

## 2020-07-02 DIAGNOSIS — R402242 Coma scale, best verbal response, confused conversation, at arrival to emergency department: Secondary | ICD-10-CM | POA: Diagnosis present

## 2020-07-02 DIAGNOSIS — R451 Restlessness and agitation: Secondary | ICD-10-CM | POA: Diagnosis not present

## 2020-07-02 DIAGNOSIS — S2231XA Fracture of one rib, right side, initial encounter for closed fracture: Secondary | ICD-10-CM | POA: Diagnosis present

## 2020-07-02 DIAGNOSIS — T148XXA Other injury of unspecified body region, initial encounter: Secondary | ICD-10-CM

## 2020-07-02 LAB — CBC WITH DIFFERENTIAL/PLATELET
Abs Immature Granulocytes: 0.1 10*3/uL — ABNORMAL HIGH (ref 0.00–0.07)
Basophils Absolute: 0.1 10*3/uL (ref 0.0–0.1)
Basophils Relative: 1 %
Eosinophils Absolute: 0 10*3/uL (ref 0.0–0.5)
Eosinophils Relative: 0 %
HCT: 31.3 % — ABNORMAL LOW (ref 39.0–52.0)
Hemoglobin: 10.6 g/dL — ABNORMAL LOW (ref 13.0–17.0)
Immature Granulocytes: 1 %
Lymphocytes Relative: 5 %
Lymphs Abs: 0.4 10*3/uL — ABNORMAL LOW (ref 0.7–4.0)
MCH: 32.5 pg (ref 26.0–34.0)
MCHC: 33.9 g/dL (ref 30.0–36.0)
MCV: 96 fL (ref 80.0–100.0)
Monocytes Absolute: 0.8 10*3/uL (ref 0.1–1.0)
Monocytes Relative: 10 %
Neutro Abs: 6.3 10*3/uL (ref 1.7–7.7)
Neutrophils Relative %: 83 %
Platelets: 155 10*3/uL (ref 150–400)
RBC: 3.26 MIL/uL — ABNORMAL LOW (ref 4.22–5.81)
RDW: 14.9 % (ref 11.5–15.5)
WBC: 7.7 10*3/uL (ref 4.0–10.5)
nRBC: 0 % (ref 0.0–0.2)

## 2020-07-02 LAB — COMPREHENSIVE METABOLIC PANEL
ALT: 91 U/L — ABNORMAL HIGH (ref 0–44)
AST: 239 U/L — ABNORMAL HIGH (ref 15–41)
Albumin: 3.6 g/dL (ref 3.5–5.0)
Alkaline Phosphatase: 85 U/L (ref 38–126)
Anion gap: 11 (ref 5–15)
BUN: <5 mg/dL — ABNORMAL LOW (ref 6–20)
CO2: 24 mmol/L (ref 22–32)
Calcium: 8 mg/dL — ABNORMAL LOW (ref 8.9–10.3)
Chloride: 101 mmol/L (ref 98–111)
Creatinine, Ser: 0.62 mg/dL (ref 0.61–1.24)
GFR, Estimated: >60 mL/min (ref >60)
Glucose, Bld: 117 mg/dL — ABNORMAL HIGH (ref 70–99)
Potassium: 3 mmol/L — ABNORMAL LOW (ref 3.5–5.1)
Sodium: 136 mmol/L (ref 135–145)
Total Bilirubin: 0.8 mg/dL (ref 0.3–1.2)
Total Protein: 6.5 g/dL (ref 6.5–8.1)

## 2020-07-02 LAB — TROPONIN I (HIGH SENSITIVITY)
Troponin I (High Sensitivity): 9 ng/L (ref <18)
Troponin I (High Sensitivity): 7 ng/L (ref <18)

## 2020-07-02 LAB — PROTIME-INR
INR: 1.2 (ref 0.8–1.2)
Prothrombin Time: 15.3 seconds — ABNORMAL HIGH (ref 11.4–15.2)

## 2020-07-02 LAB — RAPID URINE DRUG SCREEN, HOSP PERFORMED
Amphetamines: NOT DETECTED
Barbiturates: NOT DETECTED
Benzodiazepines: NOT DETECTED
Cocaine: NOT DETECTED
Opiates: NOT DETECTED
Tetrahydrocannabinol: NOT DETECTED

## 2020-07-02 LAB — ETHANOL: Alcohol, Ethyl (B): 244 mg/dL — ABNORMAL HIGH
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
## 2020-07-02 IMAGING — CT CT CHEST W/O CM
2 of 4 series · 15 of 36 positions shown, 18 images · non-contrast
Comparison: None.

CLINICAL DATA: Suspected chest trauma with moderate to severe
bilateral rib pain, right scapular tenderness and right shoulder
pain.

EXAM:
CT CHEST WITHOUT CONTRAST
TECHNIQUE: Multidetector CT imaging of the chest was performed following the
standard protocol without IV contrast.

[Series 5: thorax 2.0 · axial · 0.66mm/px · z∈[+808,+1114]mm · 12 of 172 slices shown, 15 images]
[im 10/172  mediastinal]
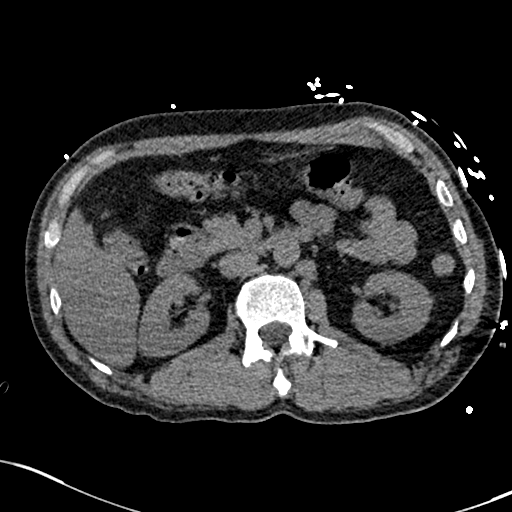
[im 10/172  lung]
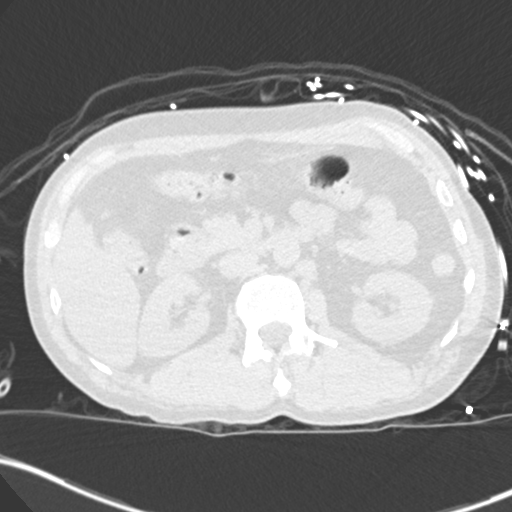
[im 28/172  lung]
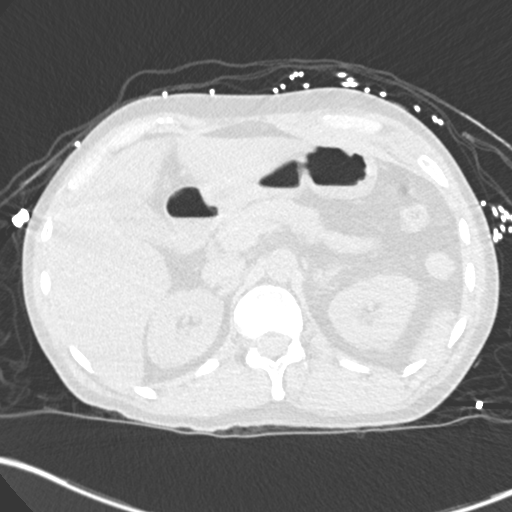
[im 37/172  lung]
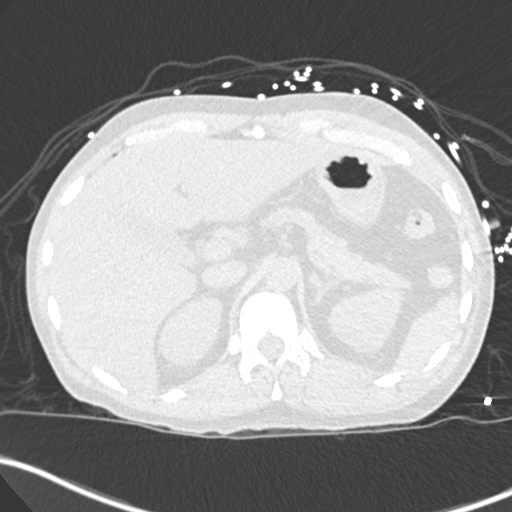
[im 55/172  lung]
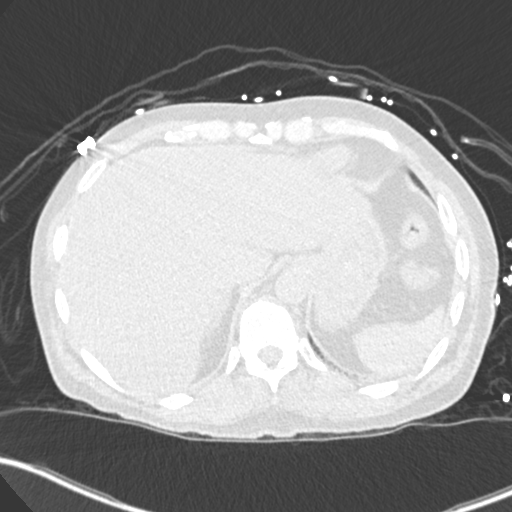
[im 64/172  mediastinal]
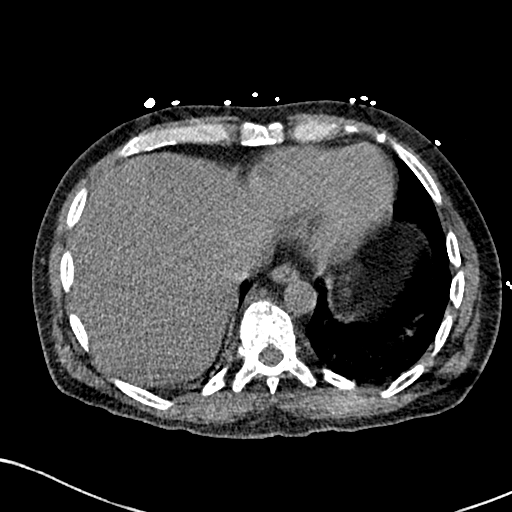
[im 64/172  lung]
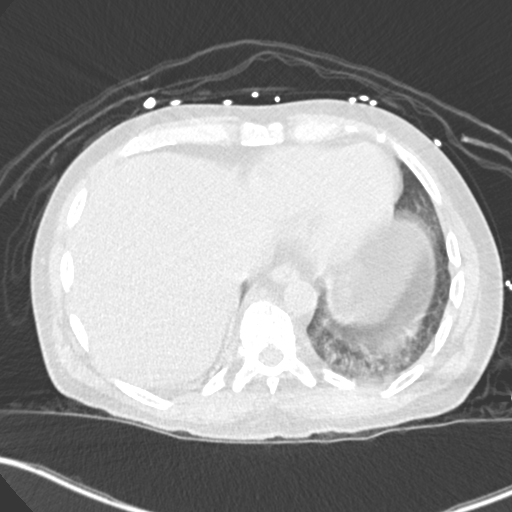
[im 82/172  lung]
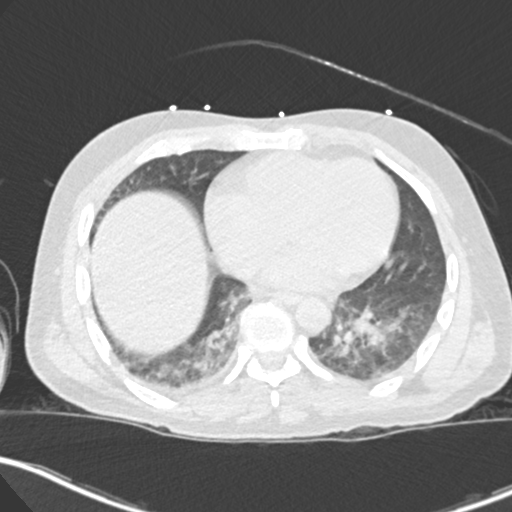
[im 91/172  lung]
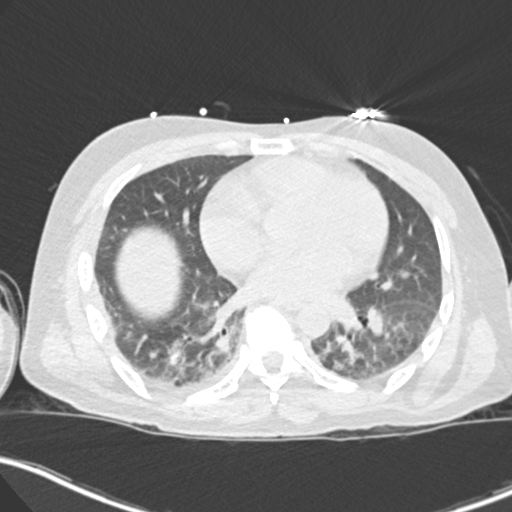
[im 109/172  lung]
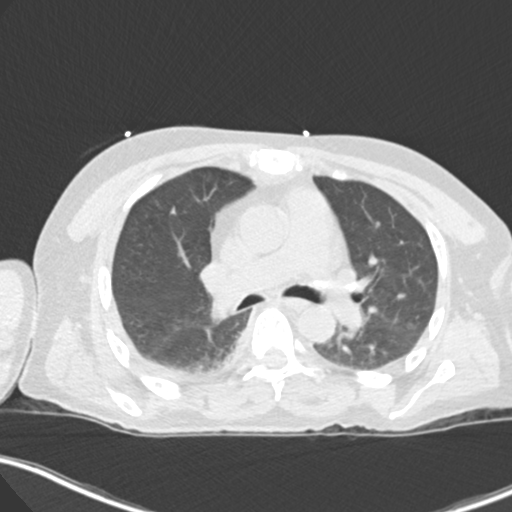
[im 118/172  mediastinal]
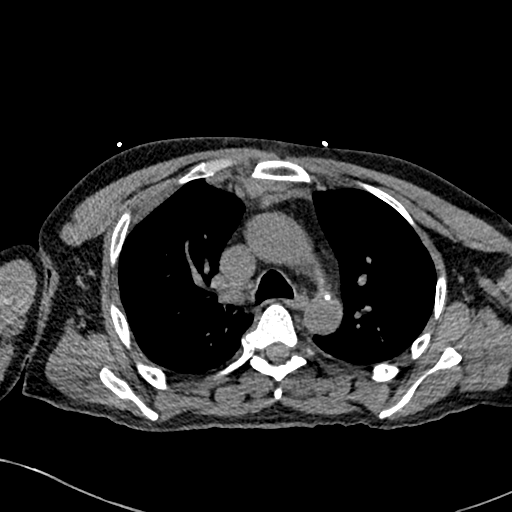
[im 118/172  lung]
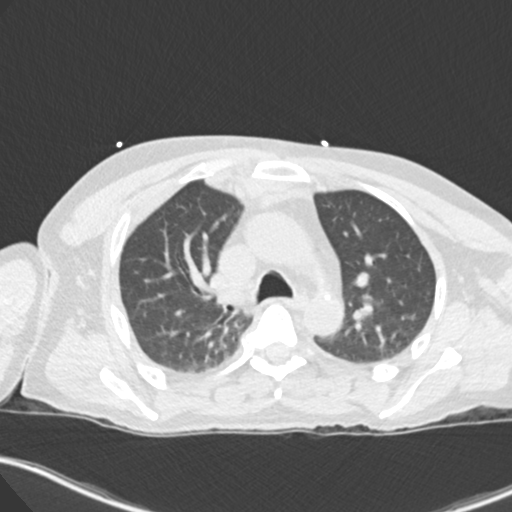
[im 136/172  lung]
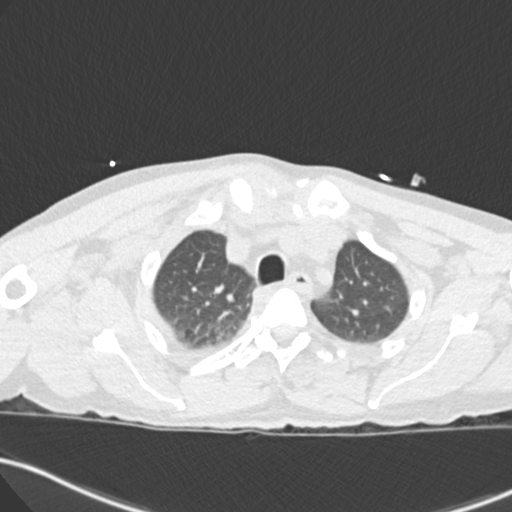
[im 145/172  lung]
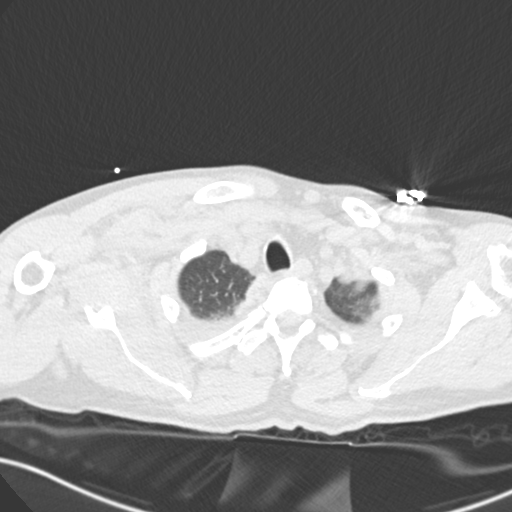
[im 163/172  lung]
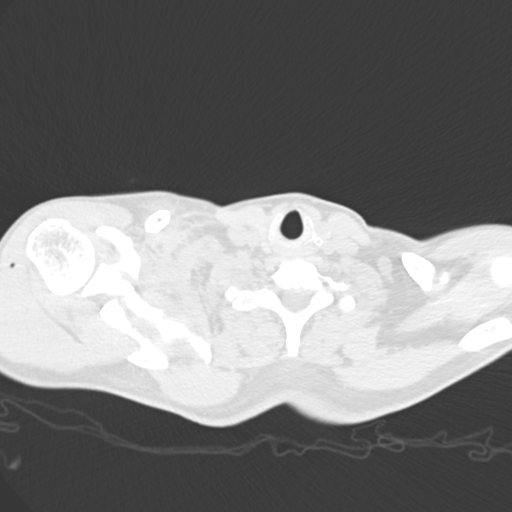

[Series 7: coronal · coronal · 0.67mm/px · 3 of 78 slices shown]
[im 16/78  lung]
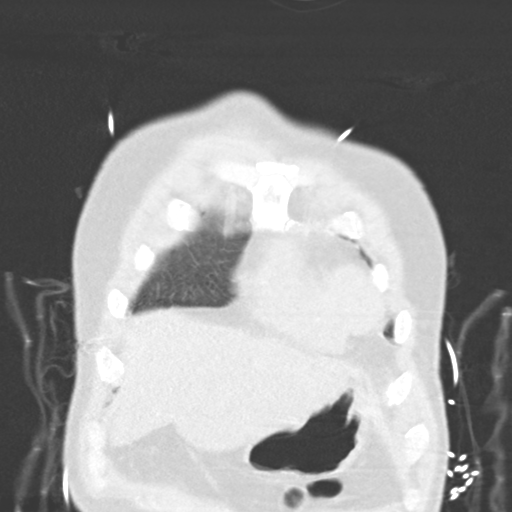
[im 31/78  lung]
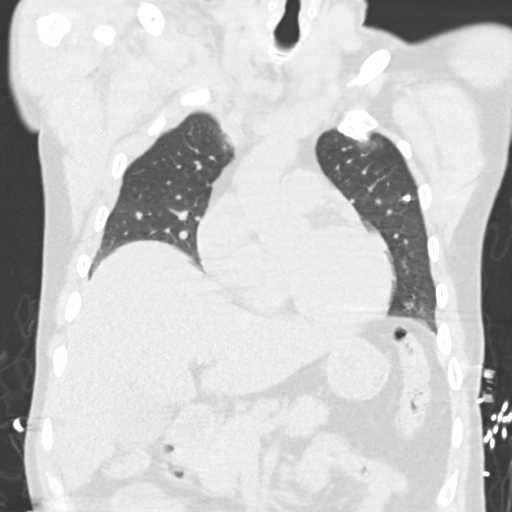
[im 47/78  lung]
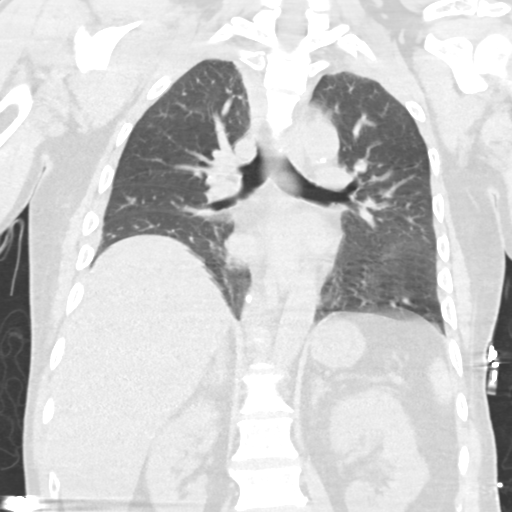

[15 of 36 positions shown; findings below may reference images not displayed]

FINDINGS: Cardiovascular: Heart size appears within normal limits. No
pericardial effusion. Mild aortic atherosclerosis.

Mediastinum/Nodes: Normal appearance of the thyroid gland. The
trachea appears patent and is midline. Normal appearance of the
esophagus. Calcified mediastinal and left hilar lymph nodes
compatible with remote granulomatous disease. No enlarged
supraclavicular, axillary, mediastinal or hilar lymph nodes.

Lungs/Pleura: No pneumothorax identified. Dependent changes with
subsegmental atelectasis noted within the lung bases. Pleural
thickening overlies the posterior right lower lobe. No signs of
pulmonary contusion or pneumothorax. Calcified granuloma identified
within the left upper lobe.

Upper Abdomen: No acute abnormality.

Musculoskeletal: There are multiple fractures involving the bony
thorax including:

-Mid shaft of right clavicle fracture is minimally displaced, image
[DATE].

-Right supraclavicular hematoma is identified, image [DATE].

-Right scapular fracture is identified which extends through the
anterior glenoid and base of the coracoid process, image [DATE].

-Mildly displaced fracture involving the anterior aspect of the
right second rib is identified, image 49/1.

-Nondisplaced fracture involves the anterior aspect of the right
fourth rib, image 83/1.

-Mildly comminuted fracture involves the posterior aspect of the
left first rib, image [DATE].

-mild superior endplate fracture deformity involving the anterior
aspect of the T7 vertebral body, image 56/8.

-Comminuted, burst fracture involves the T10 vertebra. There is mild
loss of the central vertebral body height at this level. Fractures
are also noted involving the T10 spinous process which extends into
the inferior facets bilaterally.
IMPRESSION: 1. Multiple fractures involving the bony thorax are identified.
Right clavicle fracture is minimally displaced. Right scapular
fracture is identified which extends through the anterior glenoid
and base of the coracoid process.
2. Comminuted burst fracture involves the T10 vertebra with mild
loss of the central vertebral body height at this level. Fractures
are also noted involving the T10 spinous process which extends into
the inferior facets bilaterally.
3. Mild superior endplate fracture deformity involving the anterior
aspect of the T7 vertebral body.
4. No pneumothorax identified.
5. Aortic atherosclerosis.

Aortic Atherosclerosis ([1E]-[1E]).

Critical Value/emergent results were called by telephone at the time
of interpretation on [DATE] at [DATE] to provider FERESS
, who verbally acknowledged these results. Contrast enhanced CT of
the chest, abdomen and pelvis advised at this time.

## 2020-07-02 IMAGING — CT CT ADDITIONAL VIEWS AT NO CHARGE
2 of 3 series · 17 of 46 positions shown, 19 images · IV contrast (agent unspecified)
Comparison: CT chest [DATE]

CLINICAL DATA: Fracture of scapula

EXAM:
CT ADDITIONAL VIEWS AT NO CHARGE
TECHNIQUE: Additional reconstructed images of the right shoulder obtained from
prior CT examination of chest abdomen and pelvis. Axial, sagittal,
and coronal images are reconstructed.
CONTRAST:  No additional contrast material was used.

[Series 9: shoulder bone · axial · 0.39mm/px · z∈[+1241,+1403]mm · 14 of 93 slices shown, 16 images]
[im 6/93  soft-tissue]
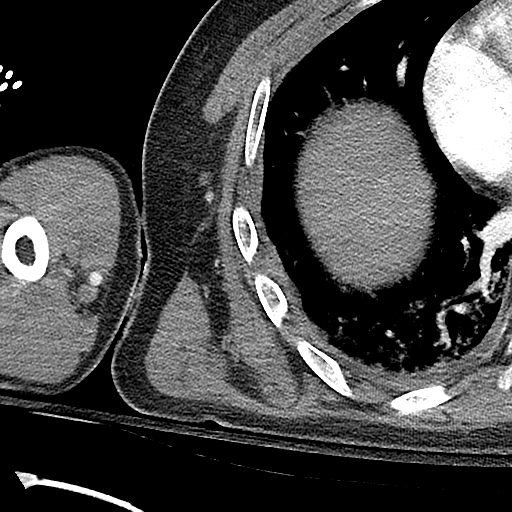
[im 6/93  bone]
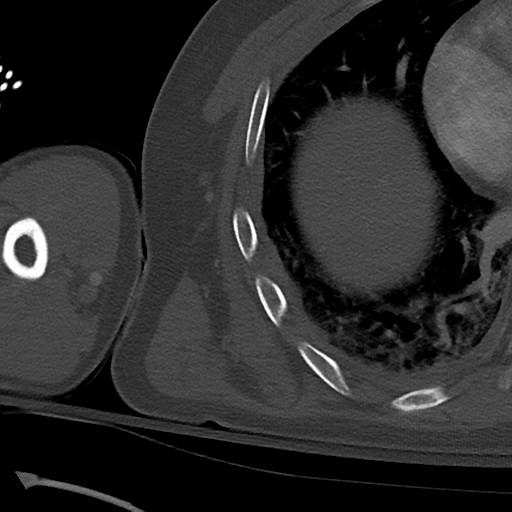
[im 12/93  soft-tissue]
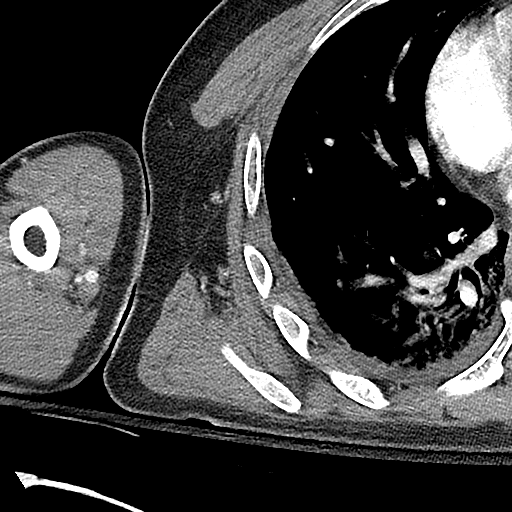
[im 18/93  soft-tissue]
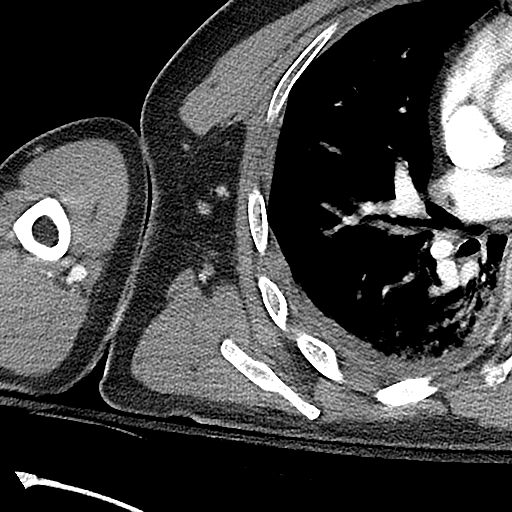
[im 24/93  soft-tissue]
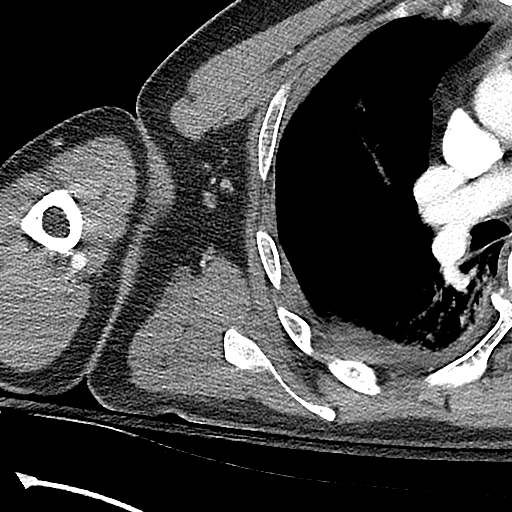
[im 30/93  soft-tissue]
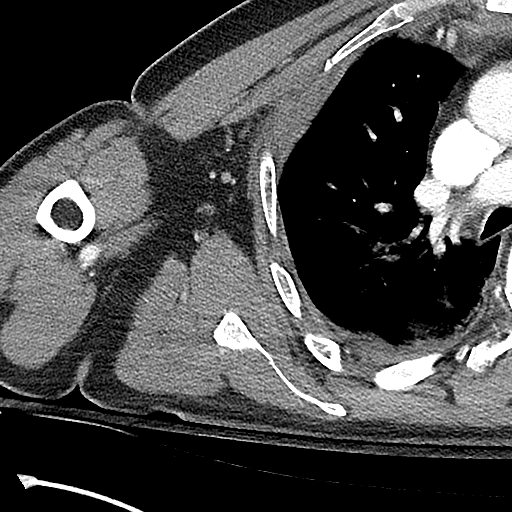
[im 36/93  soft-tissue]
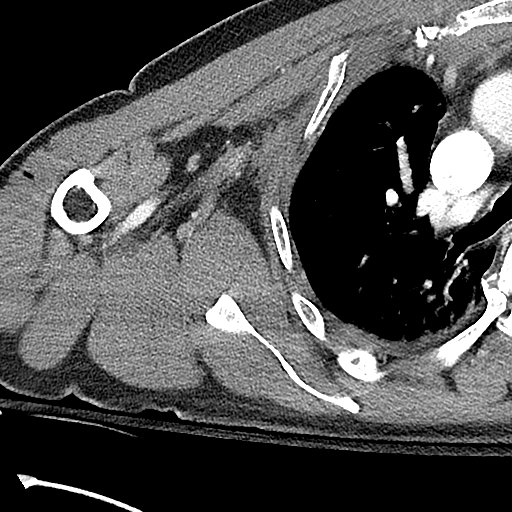
[im 42/93  soft-tissue]
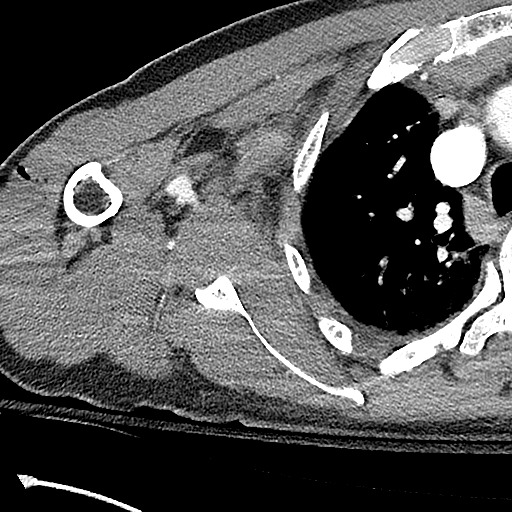
[im 51/93  soft-tissue]
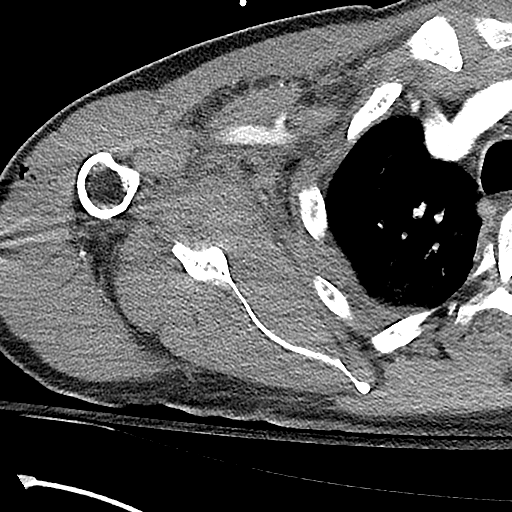
[im 57/93  soft-tissue]
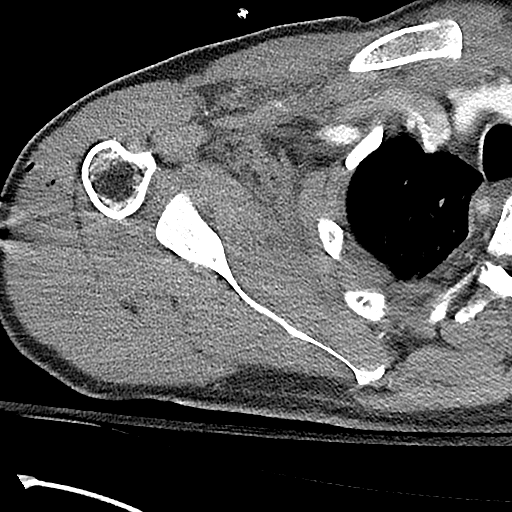
[im 57/93  bone]
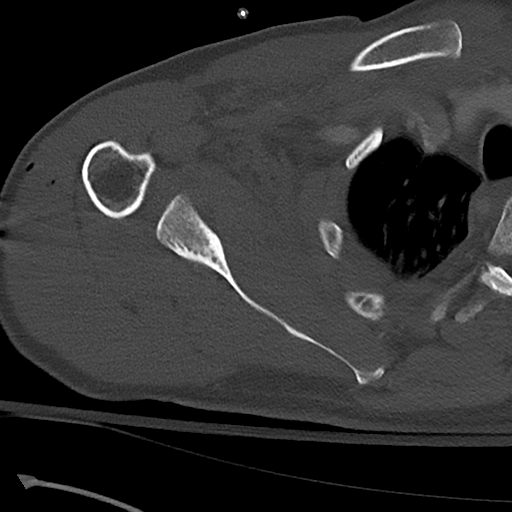
[im 63/93  soft-tissue]
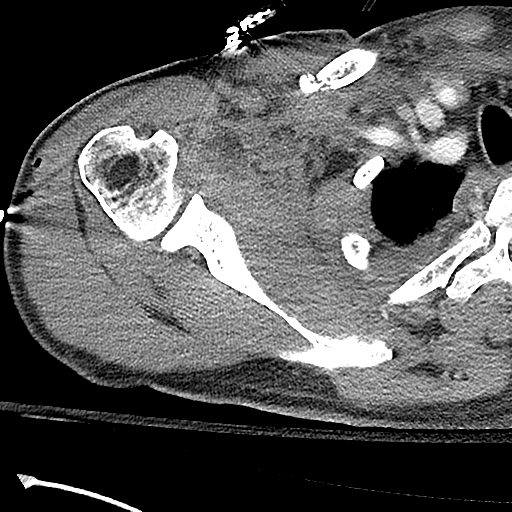
[im 69/93  soft-tissue]
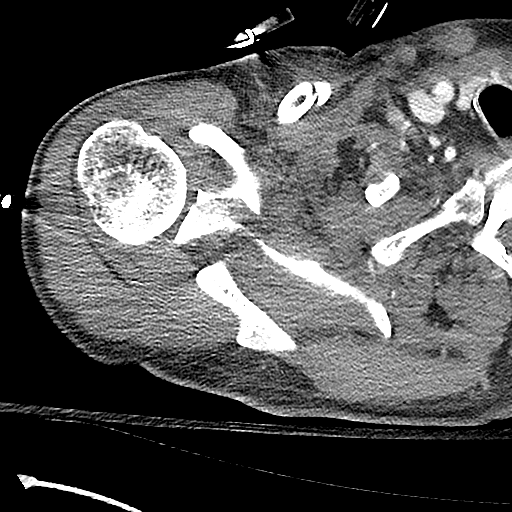
[im 75/93  soft-tissue]
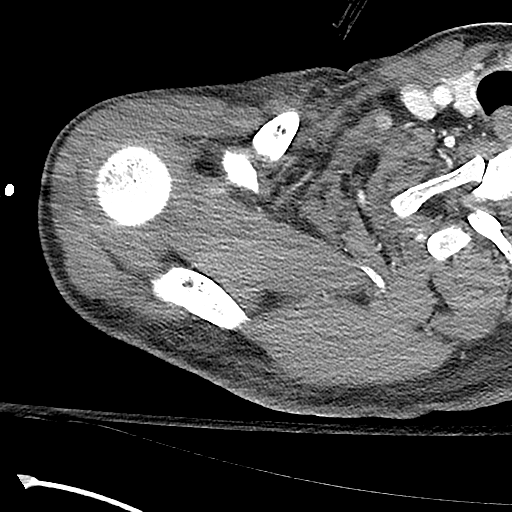
[im 81/93  soft-tissue]
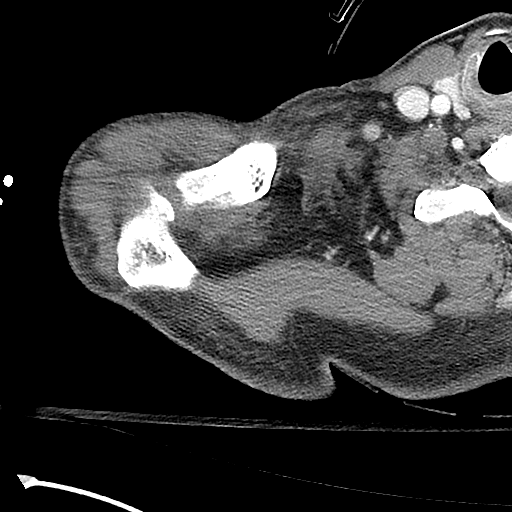
[im 87/93  soft-tissue]
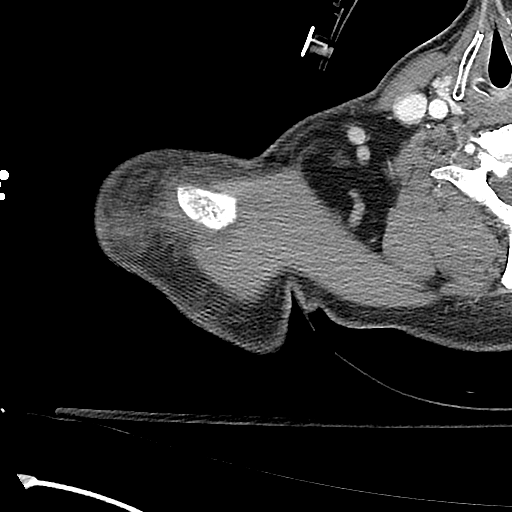

[Series 11: shoulder bone cor · coronal · 0.40mm/px · 3 of 121 slices shown]
[im 41/121  soft-tissue]
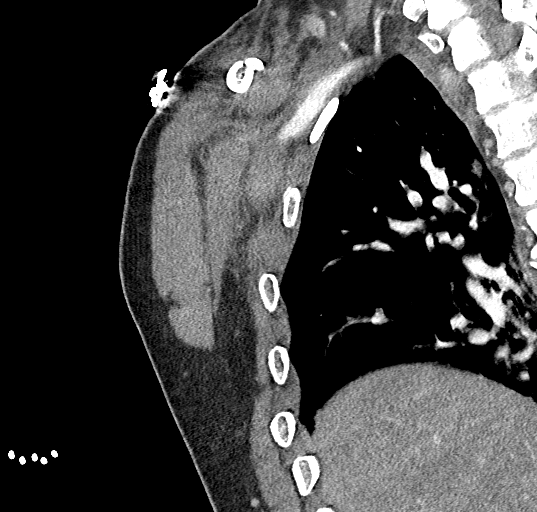
[im 54/121  soft-tissue]
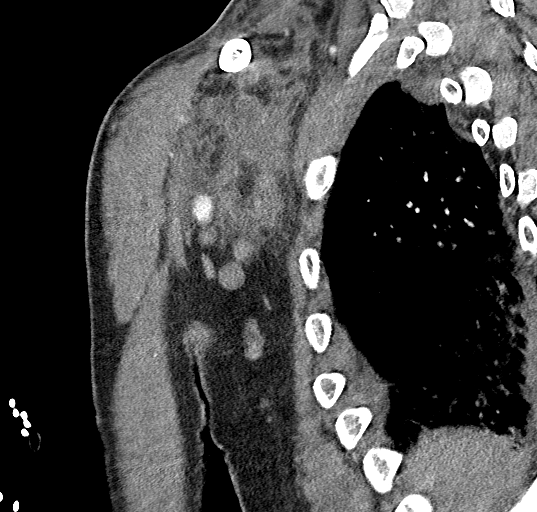
[im 67/121  soft-tissue]
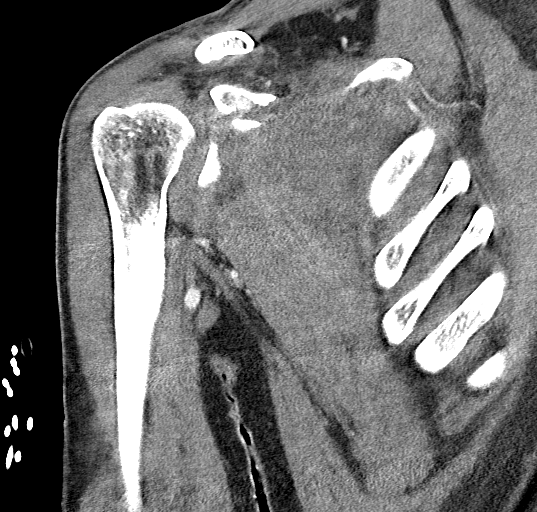

[17 of 46 positions shown; findings below may reference images not displayed]

FINDINGS: Transverse mildly comminuted fracture of the right clavicle with
half shaft width anterior displacement of the distal fracture
fragment. Acromioclavicular joint appears intact.

Fractures of the superior scapula extending to the superior glenoid
articular surface and along the base of the coracoid process.

No glenohumeral dislocation. Visualized proximal humerus appears
intact.

Nondisplaced fractures of the right second rib adjacent to the
costovertebral joint. Additional mildly displaced comminuted
fractures of the anterior right second rib. Nondisplaced fractures
of the right third rib adjacent to the costovertebral joint.
Nondisplaced fracture of the lateral right fifth rib.

There are associated soft tissue hematoma/edema causing stranding in
the supraclavicular and infraclavicular spaces. Mild subcutaneous
emphysema lateral to the proximal humerus.
IMPRESSION: Fractures of the right scapula, right clavicle, and right second and
third and fifth ribs as described.

## 2020-07-02 IMAGING — CT CT HEAD W/O CM
3 series · 15 of 47 positions shown, 18 images · non-contrast
Comparison: None.

CLINICAL DATA: Found on ground outside.  Unresponsive.

EXAM:
CT HEAD WITHOUT CONTRAST
CT CERVICAL SPINE WITHOUT CONTRAST
TECHNIQUE: Multidetector CT imaging of the head and cervical spine was
performed following the standard protocol without intravenous
contrast. Multiplanar CT image reconstructions of the cervical spine
were also generated.

[Series 1: head 5.0 h30s · axial · 0.46mm/px · z∈[+1235,+1385]mm · 9 of 36 slices shown, 12 images]
[im 3/36  brain]
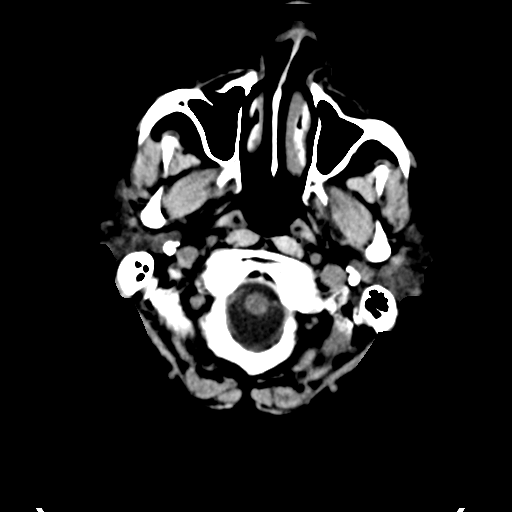
[im 3/36  bone]
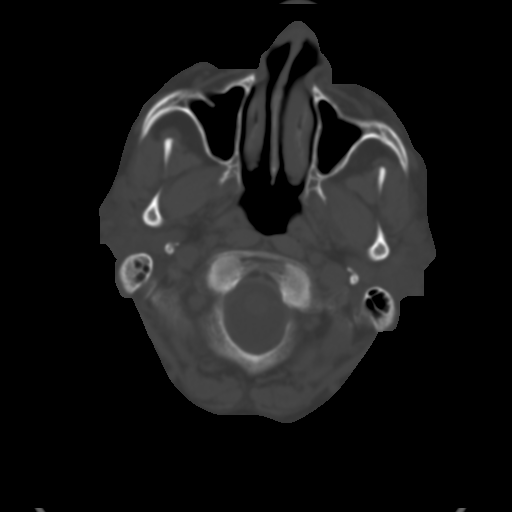
[im 7/36  brain]
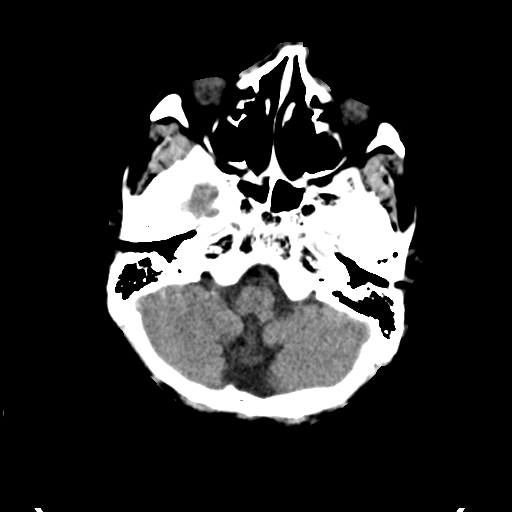
[im 10/36  brain]
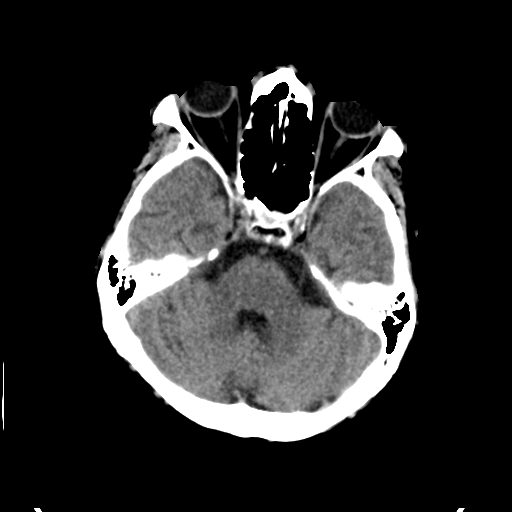
[im 14/36  brain]
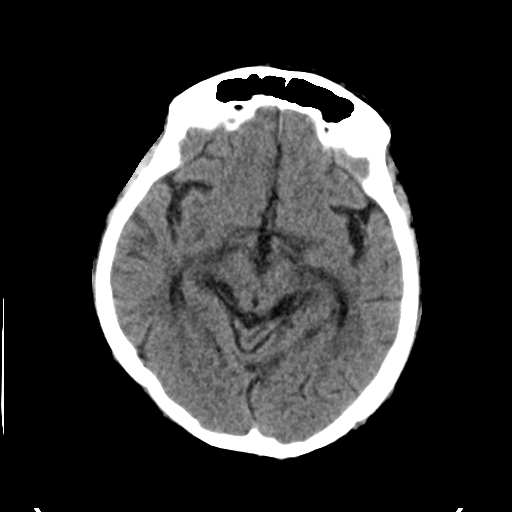
[im 19/36  brain]
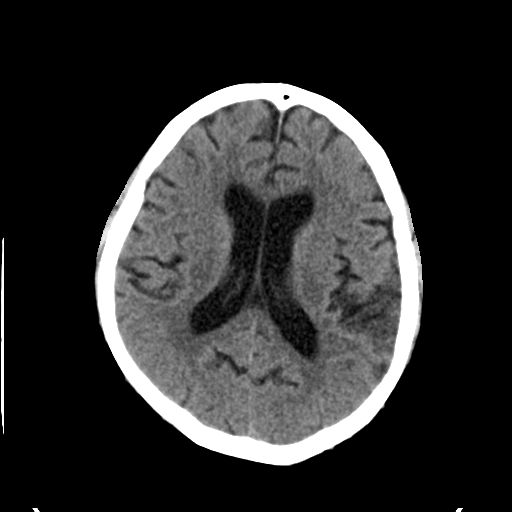
[im 19/36  bone]
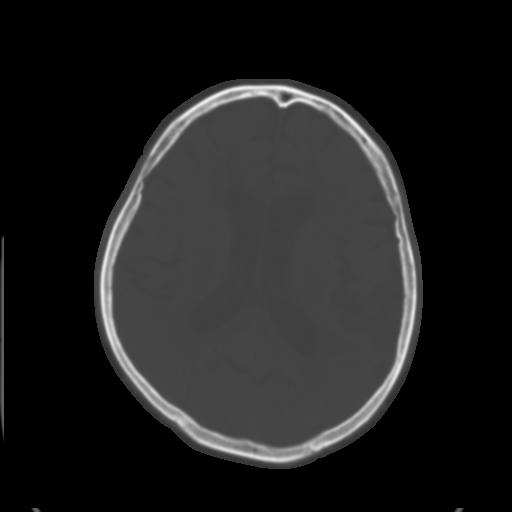
[im 22/36  brain]
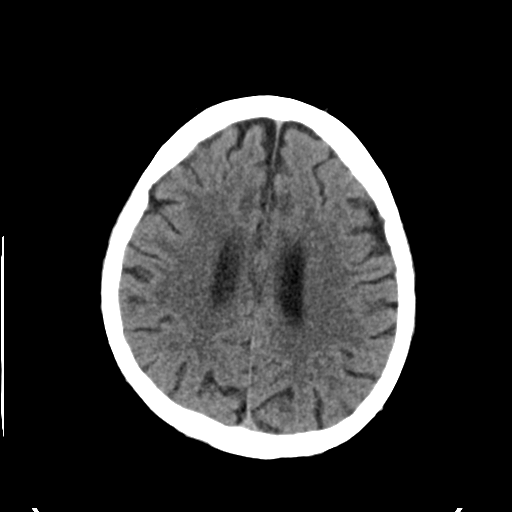
[im 26/36  brain]
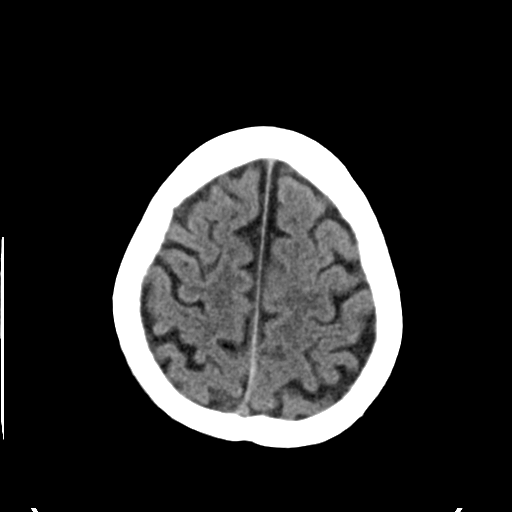
[im 29/36  brain]
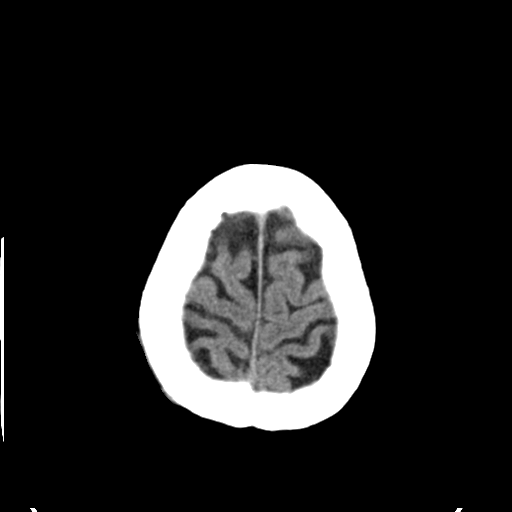
[im 33/36  brain]
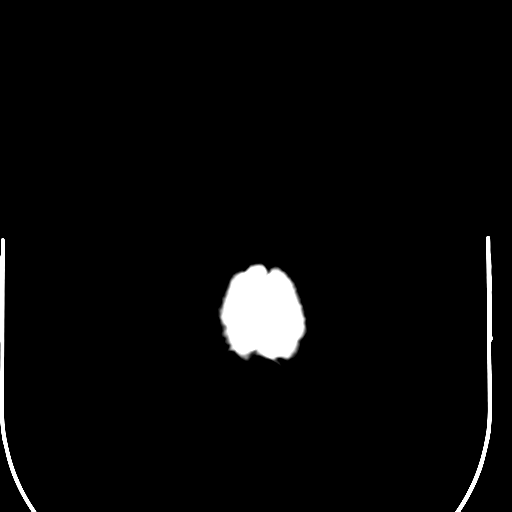
[im 33/36  bone]
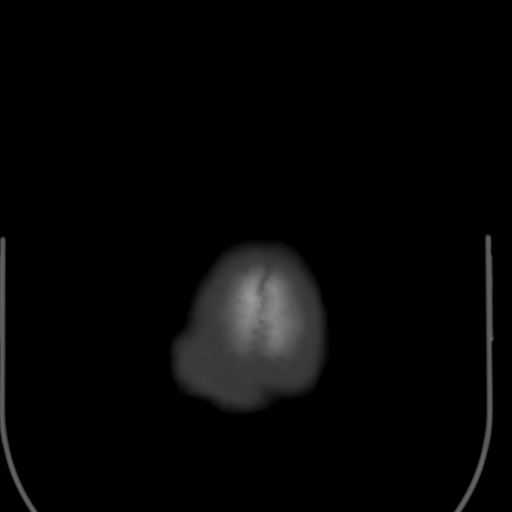

[Series 5: head 3.0 mpr cor · coronal · 0.33mm/px · 3 of 67 slices shown]
[im 23/67  brain]
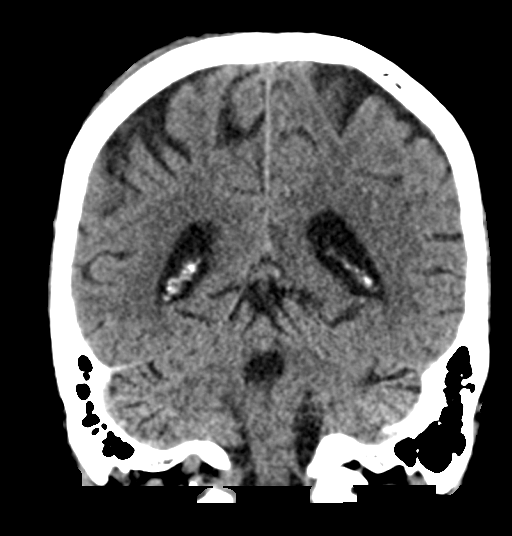
[im 30/67  brain]
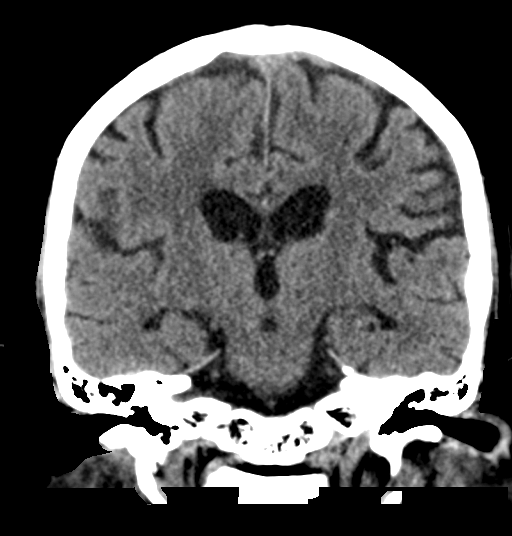
[im 37/67  brain]
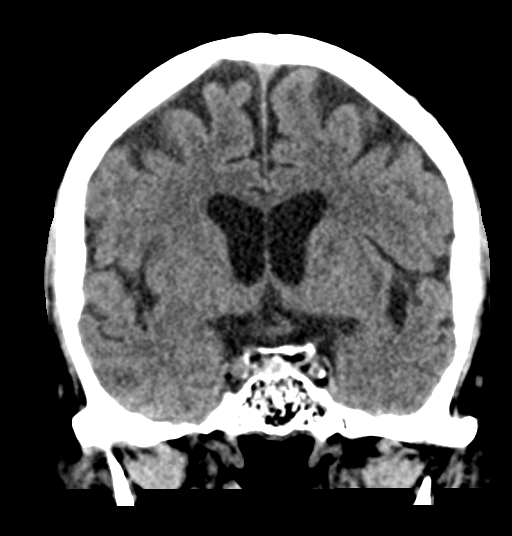

[Series 6: head 3.0 mpr sag · sagittal · 0.35mm/px · 3 of 56 slices shown]
[im 19/56  brain]
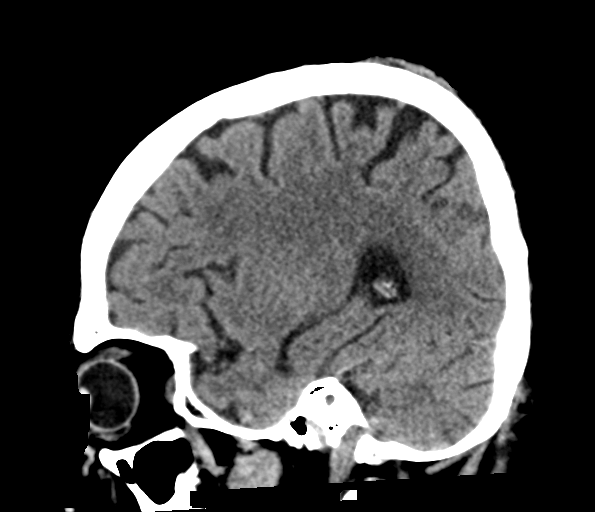
[im 28/56  brain]
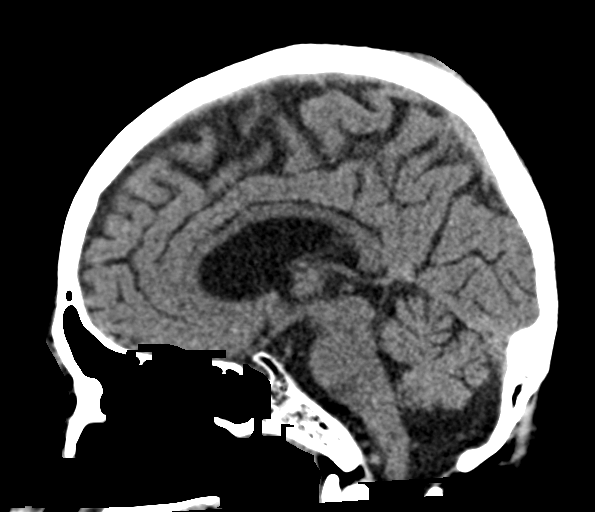
[im 37/56  brain]
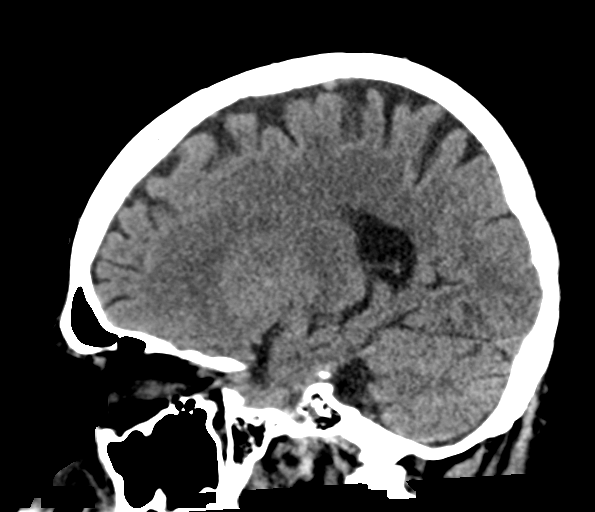

[15 of 47 positions shown; findings below may reference images not displayed]

FINDINGS: CT HEAD FINDINGS

Brain: Small amount of subarachnoid hemorrhage right sylvian
fissure. This measures approximately 1 cm in size and is high
density. No additional areas of subdural or subarachnoid hemorrhage
identified.

Generalized mild atrophy.  Negative for acute infarct or mass.

Vascular: Hyperdensity in the right sylvian fissure presumably
subarachnoid hemorrhage. This is denser than would be expected for a
nonruptured aneurysm. Otherwise no hyperdense vessel.

Skull: Negative for fracture

Sinuses/Orbits: Mild mucosal edema paranasal sinuses. Negative orbit

Other: None

CT CERVICAL SPINE FINDINGS

Alignment: Normal

Skull base and vertebrae: Negative for cervical spine fracture.

Fracture of the left first rib

Soft tissues and spinal canal: Negative

Disc levels: Mild disc degeneration throughout the cervical spine
without significant stenosis.

Upper chest: Chest CT reported separately

Other: None
IMPRESSION: Small amount of acute hemorrhage in the right sylvian fissure
presumably posttraumatic given the amount of additional injuries
identified. No subdural hemorrhage.

Fracture left m first rib

Negative for cervical spine fracture

These results were called by telephone at the time of interpretation
on [DATE] at [DATE] to provider YICEL , who verbally
acknowledged these results.

## 2020-07-02 IMAGING — CT CT CHEST-ABD-PELV W/ CM
2 of 5 series · 13 of 46 positions shown, 15 images · IV contrast (omnipaque)
Comparison: CT chest [DATE]

CLINICAL DATA: Patient was found on the ground outside and
unresponsive.

EXAM:
CT CHEST, ABDOMEN, AND PELVIS WITH CONTRAST
TECHNIQUE: Multidetector CT imaging of the chest, abdomen and pelvis was
performed following the standard protocol during bolus
administration of intravenous contrast.
CONTRAST:  100mL OMNIPAQUE IOHEXOL 300 MG/ML  SOLN

[Series 3: cap with · axial · 0.72mm/px · z∈[+827,+1397]mm · 10 of 134 slices shown, 12 images]
[im 10/134  soft-tissue]
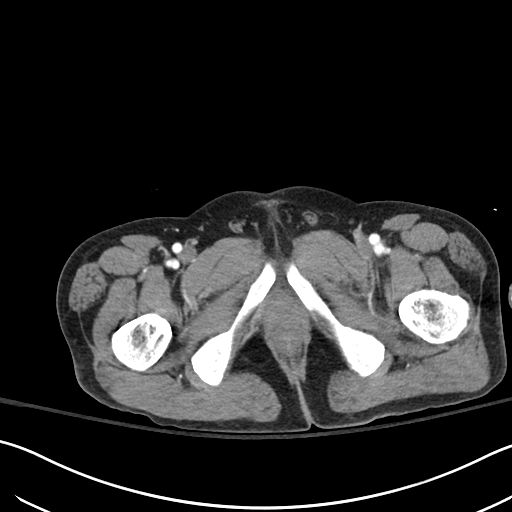
[im 10/134  bone]
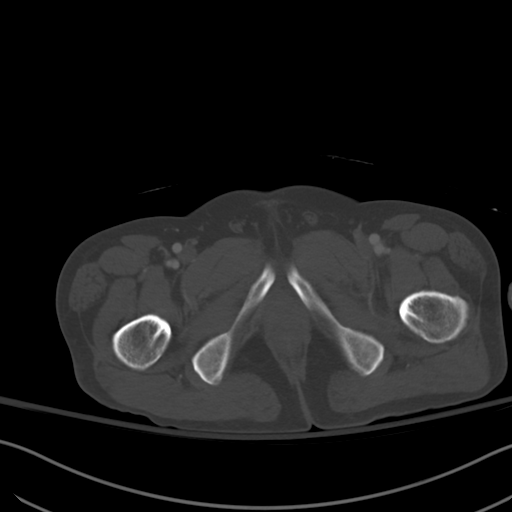
[im 20/134  soft-tissue]
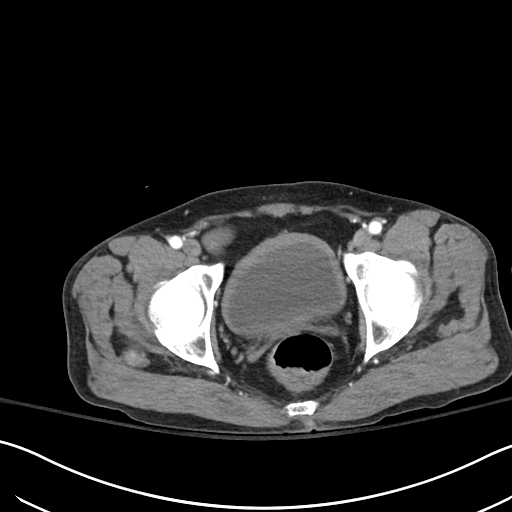
[im 39/134  soft-tissue]
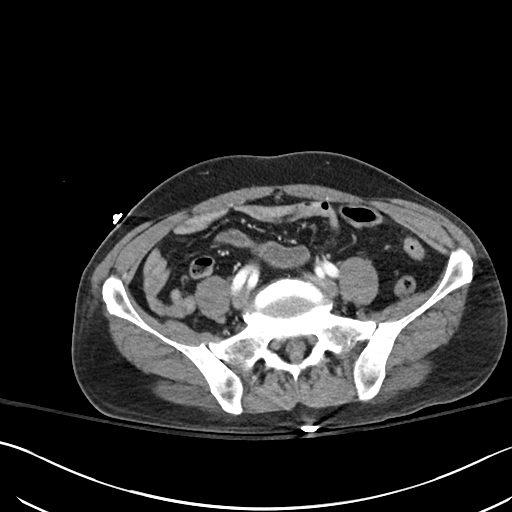
[im 48/134  soft-tissue]
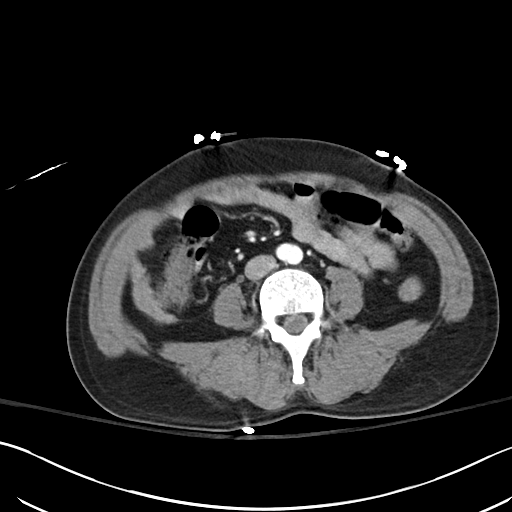
[im 58/134  soft-tissue]
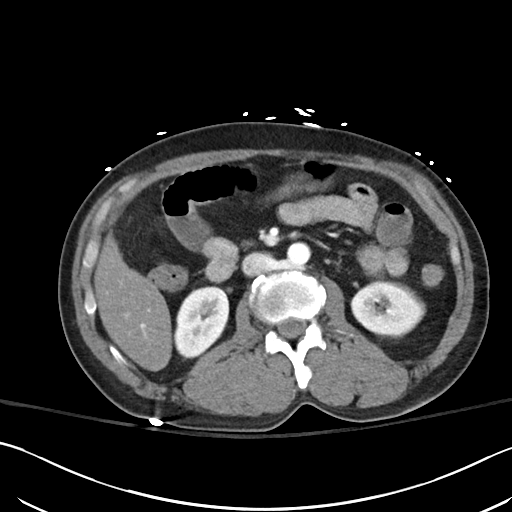
[im 77/134  soft-tissue]
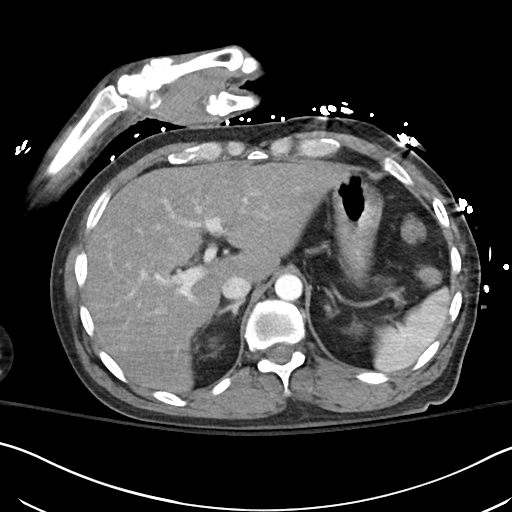
[im 86/134  soft-tissue]
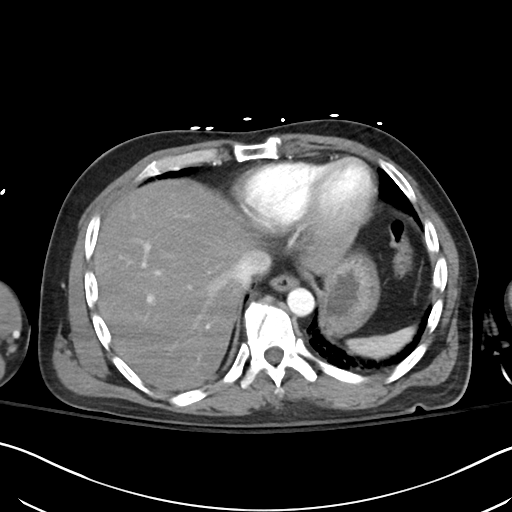
[im 96/134  soft-tissue]
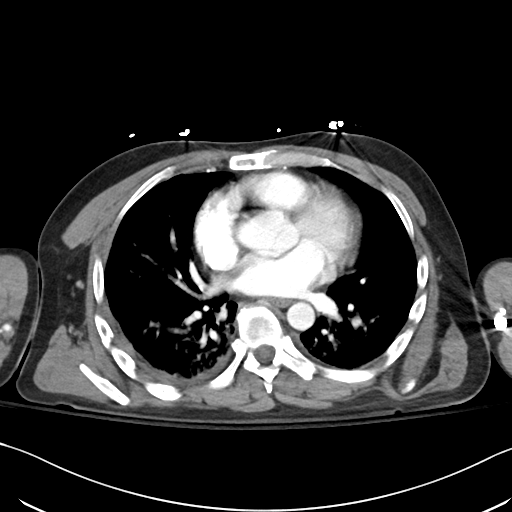
[im 115/134  soft-tissue]
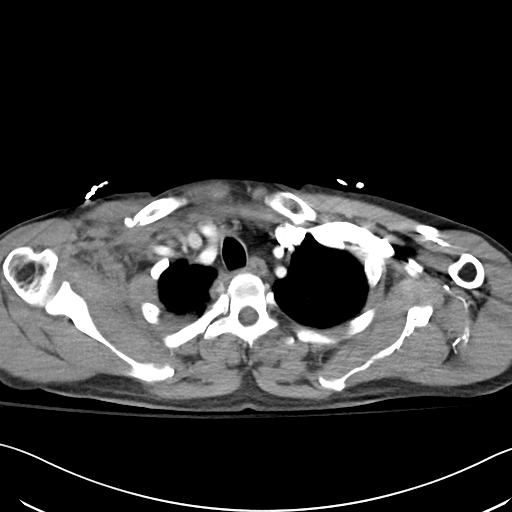
[im 115/134  bone]
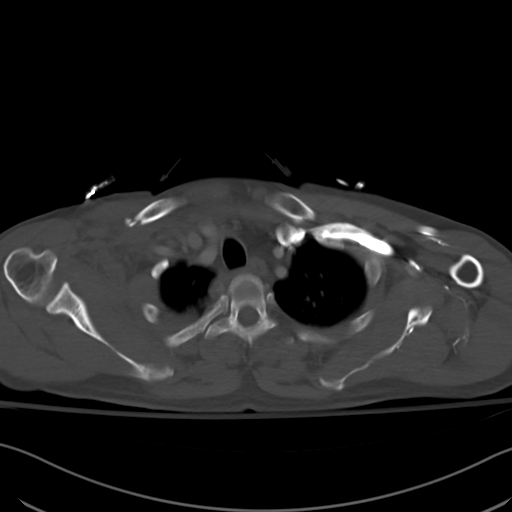
[im 124/134  soft-tissue]
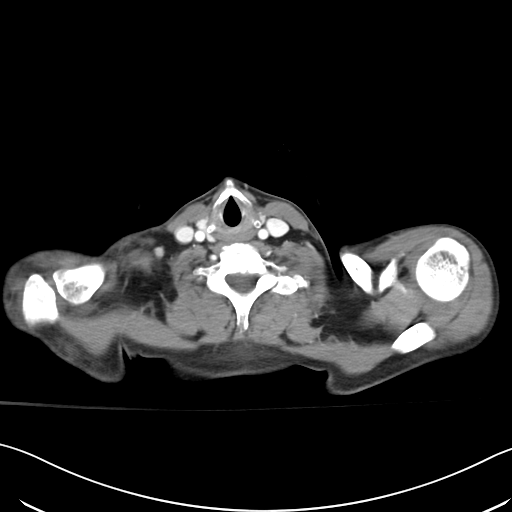

[Series 6: cor · coronal · 0.73mm/px · 3 of 87 slices shown]
[im 29/87  soft-tissue]
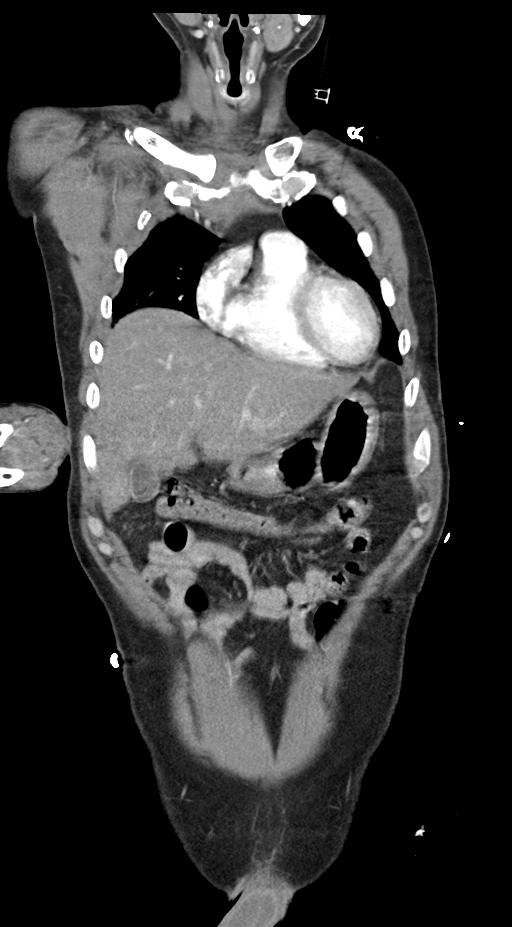
[im 39/87  soft-tissue]
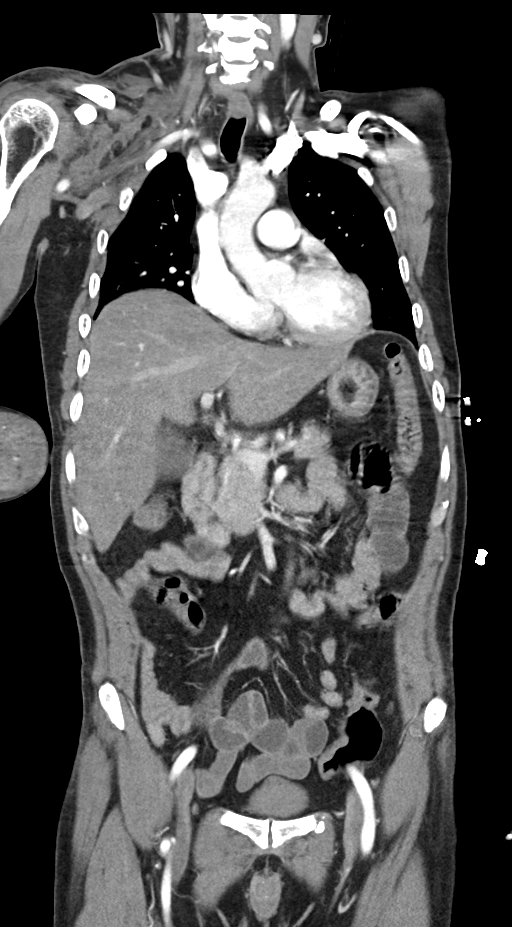
[im 48/87  soft-tissue]
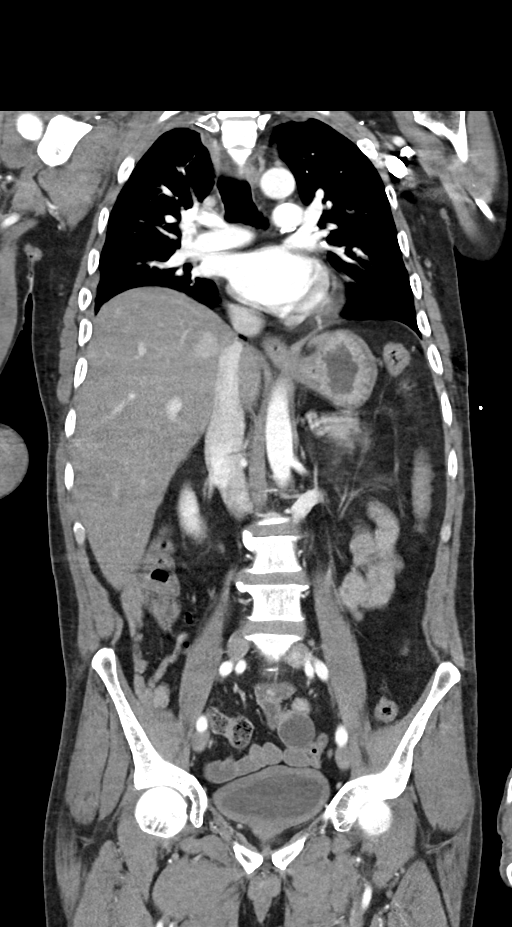

[13 of 46 positions shown; findings below may reference images not displayed]

FINDINGS: CT CHEST FINDINGS

Cardiovascular: Mild cardiac enlargement. No pericardial effusions.
Normal caliber thoracic aorta. No aortic dissection. Minimal aortic
calcification. Central pulmonary arteries are patent without
evidence of significant pulmonary embolus. Great vessel origins are
patent.

Mediastinum/Nodes: Esophagus is decompressed. No significant
lymphadenopathy. Infiltrative changes in the right supraclavicular,
infraclavicular, and retrosternal regions.

Lungs/Pleura: Motion artifact limits examination. There are patchy
airspace infiltrates throughout both lungs possibly representing
edema, pneumonia, or contusions. Small right pleural effusion.
Infiltrates are progressing since previous study.

Musculoskeletal: Mildly displaced fracture of the midshaft right
clavicle. Fracture of the right scapula involving the glenoid with
articular involvement at the glenohumeral joint. Fracture of the
left first rib. Fractures of the right first, second and fourth
ribs. Sternum appears intact. Compression fractures of T7 and T10.
T10 fracture extends to the posterior elements resulting in a 3
column fracture, potentially unstable. Fractures were previously
described on the prior study.

CT ABDOMEN PELVIS FINDINGS

Hepatobiliary: Diffuse fatty infiltration of the liver. No focal
liver lesions. Mild pericholecystic edema is nonspecific. No
cholelithiasis or bile duct dilatation.

Pancreas: Unremarkable. No pancreatic ductal dilatation or
surrounding inflammatory changes.

Spleen: No splenic injury or perisplenic hematoma.

Adrenals/Urinary Tract: No adrenal hemorrhage or renal injury
identified. Bladder wall is diffusely thickened, possibly due to
cystitis or outlet obstruction.

Stomach/Bowel: Stomach, small bowel, and colon are not abnormally
distended. No wall thickening or mesenteric infiltration is
identified. Appendix is normal.

Vascular/Lymphatic: No significant vascular findings are present. No
enlarged abdominal or pelvic lymph nodes.

Reproductive: Prostate gland is mildly enlarged.

Other: No free air or free fluid in the abdomen. Abdominal wall
musculature appears intact.

Musculoskeletal: Normal alignment of the lumbar spine. No vertebral
compression deformities. Posterior elements appear intact. Sacrum,
pelvis, and hips appear intact. Heterotopic ossification is seen
medial to the proximal left femur.
IMPRESSION: 1. Multiple bilateral rib fractures, right scapula, right clavicle,
and T7 and T10 fractures again demonstrated as previously described.
2. Associated contusions in the right supraclavicular and infra
clavicular space. Mild retrosternal soft tissue swelling suggesting
contusion but no sternal fractures identified.
3. Increasing consolidation or edema in the lungs since prior study.
4. No acute posttraumatic changes are demonstrated in the abdomen or
pelvis. No evidence of solid organ injury or bowel perforation.
5. Diffuse bladder wall thickening likely due to cystitis or outlet
obstruction. Mild prostate gland enlargement.
6. Diffuse fatty infiltration of the liver.

## 2020-07-02 MED ORDER — LORAZEPAM 2 MG/ML IJ SOLN
1.0000 mg | INTRAMUSCULAR | Status: AC | PRN
  Administered 2020-07-03 (×2): 2 mg via INTRAVENOUS
  Administered 2020-07-03: 3 mg via INTRAVENOUS
  Administered 2020-07-04: 2 mg via INTRAVENOUS
  Administered 2020-07-04: 1 mg via INTRAVENOUS
  Administered 2020-07-05 (×6): 2 mg via INTRAVENOUS
  Filled 2020-07-02 (×5): qty 1
  Filled 2020-07-02: qty 2
  Filled 2020-07-02 (×6): qty 1

## 2020-07-02 MED ORDER — PANTOPRAZOLE SODIUM 40 MG IV SOLR: 40.0000 mg | Freq: Every day | INTRAVENOUS | Status: DC

## 2020-07-02 MED ORDER — HYDROMORPHONE HCL 1 MG/ML IJ SOLN
0.5000 mg | INTRAMUSCULAR | Status: DC | PRN
  Administered 2020-07-02 – 2020-07-08 (×10): 1 mg via INTRAVENOUS
  Filled 2020-07-02 (×12): qty 1

## 2020-07-02 MED ORDER — IOHEXOL 300 MG/ML  SOLN
100.0000 mL | Freq: Once | INTRAMUSCULAR | Status: AC | PRN
  Administered 2020-07-02: 100 mL via INTRAVENOUS

## 2020-07-02 MED ORDER — ADULT MULTIVITAMIN W/MINERALS CH
1.0000 | ORAL_TABLET | Freq: Every day | ORAL | Status: DC
  Administered 2020-07-03 – 2020-07-21 (×19): 1 via ORAL
  Filled 2020-07-02 (×19): qty 1

## 2020-07-02 MED ORDER — SODIUM CHLORIDE 0.9 % IV SOLN: INTRAVENOUS | Status: DC

## 2020-07-02 MED ORDER — THIAMINE HCL 100 MG/ML IJ SOLN
100.0000 mg | Freq: Every day | INTRAMUSCULAR | Status: DC
  Administered 2020-07-06: 100 mg via INTRAVENOUS
  Filled 2020-07-02 (×2): qty 2

## 2020-07-02 MED ORDER — ONDANSETRON HCL 4 MG/2ML IJ SOLN: 4.0000 mg | Freq: Four times a day (QID) | INTRAMUSCULAR | Status: DC | PRN

## 2020-07-02 MED ORDER — FOLIC ACID 1 MG PO TABS
1.0000 mg | ORAL_TABLET | Freq: Every day | ORAL | Status: DC
  Administered 2020-07-03 – 2020-07-21 (×19): 1 mg via ORAL
  Filled 2020-07-02 (×19): qty 1

## 2020-07-02 MED ORDER — PROCHLORPERAZINE MALEATE 10 MG PO TABS
10.0000 mg | ORAL_TABLET | Freq: Four times a day (QID) | ORAL | Status: DC | PRN
  Filled 2020-07-02: qty 1

## 2020-07-02 MED ORDER — BISACODYL 10 MG RE SUPP: 10.0000 mg | Freq: Every day | RECTAL | Status: DC | PRN

## 2020-07-02 MED ORDER — DOCUSATE SODIUM 100 MG PO CAPS
100.0000 mg | ORAL_CAPSULE | Freq: Two times a day (BID) | ORAL | Status: DC
  Administered 2020-07-03 – 2020-07-21 (×35): 100 mg via ORAL
  Filled 2020-07-02 (×39): qty 1

## 2020-07-02 MED ORDER — ONDANSETRON 4 MG PO TBDP: 4.0000 mg | ORAL_TABLET | Freq: Four times a day (QID) | ORAL | Status: DC | PRN

## 2020-07-02 MED ORDER — TETANUS-DIPHTH-ACELL PERTUSSIS 5-2.5-18.5 LF-MCG/0.5 IM SUSY
0.5000 mL | PREFILLED_SYRINGE | Freq: Once | INTRAMUSCULAR | Status: AC
  Administered 2020-07-02: 0.5 mL via INTRAMUSCULAR
  Filled 2020-07-02: qty 0.5

## 2020-07-02 MED ORDER — FENTANYL CITRATE (PF) 100 MCG/2ML IJ SOLN
50.0000 ug | Freq: Once | INTRAMUSCULAR | Status: AC
  Administered 2020-07-02: 50 ug via INTRAVENOUS
  Filled 2020-07-02: qty 2

## 2020-07-02 MED ORDER — THIAMINE HCL 100 MG PO TABS
100.0000 mg | ORAL_TABLET | Freq: Every day | ORAL | Status: DC
  Administered 2020-07-03 – 2020-07-21 (×18): 100 mg via ORAL
  Filled 2020-07-02 (×18): qty 1

## 2020-07-02 MED ORDER — ACETAMINOPHEN 500 MG PO TABS: 500.0000 mg | ORAL_TABLET | Freq: Four times a day (QID) | ORAL | Status: DC | PRN

## 2020-07-02 MED ORDER — OXYCODONE HCL 5 MG PO TABS
5.0000 mg | ORAL_TABLET | ORAL | Status: DC | PRN
  Administered 2020-07-03 – 2020-07-04 (×5): 10 mg via ORAL
  Administered 2020-07-06 (×2): 5 mg via ORAL
  Administered 2020-07-07 (×3): 10 mg via ORAL
  Administered 2020-07-08: 5 mg via ORAL
  Administered 2020-07-09 – 2020-07-11 (×7): 10 mg via ORAL
  Administered 2020-07-11: 5 mg via ORAL
  Administered 2020-07-16 (×2): 10 mg via ORAL
  Filled 2020-07-02: qty 1
  Filled 2020-07-02 (×2): qty 2
  Filled 2020-07-02: qty 1
  Filled 2020-07-02 (×12): qty 2
  Filled 2020-07-02 (×2): qty 1
  Filled 2020-07-02 (×4): qty 2

## 2020-07-02 MED ORDER — SODIUM CHLORIDE 0.9 % IV BOLUS
1000.0000 mL | Freq: Once | INTRAVENOUS | Status: AC
  Administered 2020-07-02: 1000 mL via INTRAVENOUS

## 2020-07-02 MED ORDER — LORAZEPAM 1 MG PO TABS
1.0000 mg | ORAL_TABLET | ORAL | Status: AC | PRN
  Administered 2020-07-03: 2 mg via ORAL
  Administered 2020-07-03 – 2020-07-05 (×12): 1 mg via ORAL
  Filled 2020-07-02 (×8): qty 1
  Filled 2020-07-02: qty 2
  Filled 2020-07-02 (×4): qty 1

## 2020-07-02 MED ORDER — PROCHLORPERAZINE EDISYLATE 10 MG/2ML IJ SOLN: 5.0000 mg | Freq: Four times a day (QID) | INTRAMUSCULAR | Status: DC | PRN

## 2020-07-02 MED ORDER — PANTOPRAZOLE SODIUM 40 MG PO TBEC: 40.0000 mg | DELAYED_RELEASE_TABLET | Freq: Every day | ORAL | Status: DC

## 2020-07-02 NOTE — ED Notes (Signed)
Ortho at bedside.

## 2020-07-02 NOTE — Progress Notes (Signed)
Full consult to follow.  Per ED PA, pt intoxicated, involved likely in a fall, is awake, alert and is moving all extremities well. I have reviewed the [facility] which demonstrates a small amount of right sylvian hemorrhage without any mass effect or MLS. No HCP. CT Cspine negative. CT Tspine reveals an anterior wedge fracture of T7 and a fracture of the T10 body including fracture through the SP and lamina/facet. The posterior element fractures appear non-displaced and there is no kyphosis or spondylolisthesis.   Can be observed for ICH with routine SZ prophy (7d Keppra). As he is intact with non-displaced fractures at T7 and T10 we can likely attempt to treat conservatively with clamshell type TLSO brace.   Lisbeth Renshaw, MD [facility]

## 2020-07-02 NOTE — ED Notes (Signed)
Ortho paged at this time 

## 2020-07-02 NOTE — H&P (Signed)
History   Samuel [name] is an 50 y.o. male.   Chief Complaint:  Chief Complaint  Patient presents with   Fall   Alcohol Intoxication    Pt is a 50 yo M who presented to the ED as a fall.  He has given multiple stories to the ED PA.  After several hours of sobering up, he says he was carrying heavy tiles on his right shoulder and back.  He had at least 2 beers today and was working outside.  He doesn't think he fell, he thinks he just passed out.  There was no history of n/v.    He drinks around 12 beers per night "if he has the money."    Past Medical History:  Diagnosis Date   ETOH abuse     History reviewed. No pertinent surgical history. Pt denies surgery  History reviewed. No pertinent family history. Social History:  reports current alcohol use of about 72.0 [name] drinks of alcohol per week. No history on file for tobacco use and drug use.  Allergies  No Known Allergies  Home Medications  none  Trauma Course   Results for orders placed or performed during the hospital encounter of 07/02/20 (from the past 48 hour(s))  Comprehensive metabolic panel     Status: Abnormal   Collection Time: 07/02/20  5:18 PM  Result Value Ref Range   Sodium 136 135 - 145 mmol/L   Potassium 3.0 (L) 3.5 - 5.1 mmol/L   Chloride 101 98 - 111 mmol/L   CO2 24 22 - 32 mmol/L   Glucose, Bld 117 (H) 70 - 99 mg/dL    Comment: Glucose reference range applies only to samples taken after fasting for at least 8 hours.   BUN <5 (L) 6 - 20 mg/dL   Creatinine, Ser 1.61 0.61 - 1.24 mg/dL   Calcium 8.0 (L) 8.9 - 10.3 mg/dL   Total Protein 6.5 6.5 - 8.1 g/dL   Albumin 3.6 3.5 - 5.0 g/dL   AST 096 (H) 15 - 41 U/L   ALT 91 (H) 0 - 44 U/L   Alkaline Phosphatase 85 38 - 126 U/L   Total Bilirubin 0.8 0.3 - 1.2 mg/dL   GFR, Estimated >04 >54 mL/min    Comment: (NOTE) Calculated using the CKD-EPI Creatinine Equation (2021)    Anion gap 11 5 - 15    Comment: Performed at [facility], [address], [place], [name] [address]  CBC with Differential     Status: Abnormal   Collection Time: 07/02/20  5:18 PM  Result Value Ref Range   WBC 7.7 4.0 - 10.5 K/uL   RBC 3.26 (L) 4.22 - 5.81 MIL/uL   Hemoglobin 10.6 (L) 13.0 - 17.0 g/dL   HCT 91.4 (L) 78.2 - 95.6 %   MCV 96.0 80.0 - 100.0 fL   MCH 32.5 26.0 - 34.0 pg   MCHC 33.9 30.0 - 36.0 g/dL   RDW 21.3 08.6 - 57.8 %   Platelets 155 150 - 400 K/uL   nRBC 0.0 0.0 - 0.2 %   Neutrophils Relative % 83 %   Neutro Abs 6.3 1.7 - 7.7 K/uL   Lymphocytes Relative 5 %   Lymphs Abs 0.4 (L) 0.7 - 4.0 K/uL   Monocytes Relative 10 %   Monocytes Absolute 0.8 0.1 - 1.0 K/uL   Eosinophils Relative 0 %   Eosinophils Absolute 0.0 0.0 - 0.5 K/uL   Basophils Relative  1 %   Basophils Absolute 0.1 0.0 - 0.1 K/uL   Immature Granulocytes 1 %   Abs Immature Granulocytes 0.10 (H) 0.00 - 0.07 K/uL    Comment: Performed at [facility], [address], [place], [name] [address]  Ethanol     Status: Abnormal   Collection Time: 07/02/20  5:18 PM  Result Value Ref Range   Alcohol, Ethyl (B) 244 (H) <10 mg/dL    Comment: (NOTE) Lowest detectable limit for serum alcohol is 10 mg/dL.  For medical purposes only. Performed at [facility], [address] [place], [name] [address]   Urine rapid drug screen (hosp performed)     Status: None   Collection Time: 07/02/20  6:01 PM  Result Value Ref Range   Opiates NONE DETECTED NONE DETECTED   Cocaine NONE DETECTED NONE DETECTED   Benzodiazepines NONE DETECTED NONE DETECTED   Amphetamines NONE DETECTED NONE DETECTED   Tetrahydrocannabinol NONE DETECTED NONE DETECTED   Barbiturates NONE DETECTED NONE DETECTED    Comment: (NOTE) DRUG SCREEN FOR MEDICAL PURPOSES ONLY.  IF CONFIRMATION IS NEEDED FOR ANY PURPOSE, NOTIFY LAB WITHIN 5 DAYS.  LOWEST DETECTABLE LIMITS FOR URINE DRUG SCREEN Drug Class                     Cutoff (ng/mL) Amphetamine and metabolites     1000 Barbiturate and metabolites    200 Benzodiazepine                 200 Tricyclics and metabolites     300 Opiates and metabolites        300 Cocaine and metabolites        300 THC                            50 Performed at [facility], [address], [place], [name] [address]   Troponin I (High Sensitivity)     Status: None   Collection Time: 07/02/20  6:29 PM  Result Value Ref Range   Troponin I (High Sensitivity) 9 <18 ng/L    Comment: (NOTE) Elevated high sensitivity troponin I (hsTnI) values and significant  changes across serial measurements may suggest ACS but many other  chronic and acute conditions are known to elevate hsTnI results.  Refer to the "Links" section for chest pain algorithms and additional  guidance. Performed at [facility], [address], [place], [name] [address]    CT Head Wo Contrast  Result Date: 07/02/2020 CLINICAL DATA:  Found on ground outside.  Unresponsive. EXAM: CT HEAD WITHOUT CONTRAST CT CERVICAL SPINE WITHOUT CONTRAST TECHNIQUE: Multidetector CT imaging of the head and cervical spine was performed following the [name] protocol without intravenous contrast. Multiplanar CT image reconstructions of the cervical spine were also generated. COMPARISON:  None. FINDINGS: CT HEAD FINDINGS Brain: Small amount of subarachnoid hemorrhage right sylvian fissure. This measures approximately 1 cm in size and is high density. No additional areas of subdural or subarachnoid hemorrhage identified. Generalized mild atrophy.  Negative for acute infarct or mass. Vascular: Hyperdensity in the right sylvian fissure presumably subarachnoid hemorrhage. This is denser than would be expected for a nonruptured aneurysm. Otherwise no hyperdense vessel. Skull: Negative for fracture Sinuses/Orbits: Mild mucosal edema paranasal sinuses. Negative orbit Other: None CT CERVICAL SPINE FINDINGS Alignment: Normal Skull base and vertebrae: Negative for cervical spine  fracture. Fracture of the left first  rib Soft tissues and spinal canal: Negative Disc levels: Mild disc degeneration throughout the cervical spine without significant stenosis. Upper chest: Chest CT reported separately Other: None IMPRESSION: Small amount of acute hemorrhage in the right sylvian fissure presumably posttraumatic given the amount of additional injuries identified. No subdural hemorrhage. Fracture left m first rib Negative for cervical spine fracture These results were called by telephone at the time of interpretation on 07/02/2020 at 6:56 pm to provider [facility] , who verbally acknowledged these results. Electronically Signed   By: Marlan Palau M.D.   On: 07/02/2020 18:57   CT CHEST WO CONTRAST  Result Date: 07/02/2020 CLINICAL DATA:  Suspected chest trauma with moderate to severe bilateral rib pain, right scapular tenderness and right shoulder pain. EXAM: CT CHEST WITHOUT CONTRAST TECHNIQUE: Multidetector CT imaging of the chest was performed following the [name] protocol without IV contrast. COMPARISON:  None. FINDINGS: Cardiovascular: Heart size appears within normal limits. No pericardial effusion. Mild aortic atherosclerosis. Mediastinum/Nodes: Normal appearance of the thyroid gland. The trachea appears patent and is midline. Normal appearance of the esophagus. Calcified mediastinal and left hilar lymph nodes compatible with remote granulomatous disease. No enlarged supraclavicular, axillary, mediastinal or hilar lymph nodes. Lungs/Pleura: No pneumothorax identified. Dependent changes with subsegmental atelectasis noted within the lung bases. Pleural thickening overlies the posterior right lower lobe. No signs of pulmonary contusion or pneumothorax. Calcified granuloma identified within the left upper lobe. Upper Abdomen: No acute abnormality. Musculoskeletal: There are multiple fractures involving the bony thorax including: -Mid shaft of right clavicle fracture is minimally  displaced, image 20/1. -Right supraclavicular hematoma is identified, image 6/4. -Right scapular fracture is identified which extends through the anterior glenoid and base of the coracoid process, image 9/1. -Mildly displaced fracture involving the anterior aspect of the right second rib is identified, image 49/1. -Nondisplaced fracture involves the anterior aspect of the right fourth rib, image 83/1. -Mildly comminuted fracture involves the posterior aspect of the left first rib, image 19/1. -mild superior endplate fracture deformity involving the anterior aspect of the T7 vertebral body, image 56/8. -Comminuted, burst fracture involves the T10 vertebra. There is mild loss of the central vertebral body height at this level. Fractures are also noted involving the T10 spinous process which extends into the inferior facets bilaterally. IMPRESSION: 1. Multiple fractures involving the bony thorax are identified. Right clavicle fracture is minimally displaced. Right scapular fracture is identified which extends through the anterior glenoid and base of the coracoid process. 2. Comminuted burst fracture involves the T10 vertebra with mild loss of the central vertebral body height at this level. Fractures are also noted involving the T10 spinous process which extends into the inferior facets bilaterally. 3. Mild superior endplate fracture deformity involving the anterior aspect of the T7 vertebral body. 4. No pneumothorax identified. 5. Aortic atherosclerosis. Aortic Atherosclerosis (ICD10-I70.0). Critical Value/emergent results were called by telephone at the time of interpretation on 07/02/2020 at 7:06 pm to provider [facility] , who verbally acknowledged these results. Contrast enhanced CT of the chest, abdomen and pelvis advised at this time. Electronically Signed   By: Signa Kell M.D.   On: 07/02/2020 19:07   CT Cervical Spine Wo Contrast  Result Date: 07/02/2020 CLINICAL DATA:  Found on ground outside.   Unresponsive. EXAM: CT HEAD WITHOUT CONTRAST CT CERVICAL SPINE WITHOUT CONTRAST TECHNIQUE: Multidetector CT imaging of the head and cervical spine was performed following the [name] protocol without intravenous contrast. Multiplanar CT image reconstructions of the cervical spine were  also generated. COMPARISON:  None. FINDINGS: CT HEAD FINDINGS Brain: Small amount of subarachnoid hemorrhage right sylvian fissure. This measures approximately 1 cm in size and is high density. No additional areas of subdural or subarachnoid hemorrhage identified. Generalized mild atrophy.  Negative for acute infarct or mass. Vascular: Hyperdensity in the right sylvian fissure presumably subarachnoid hemorrhage. This is denser than would be expected for a nonruptured aneurysm. Otherwise no hyperdense vessel. Skull: Negative for fracture Sinuses/Orbits: Mild mucosal edema paranasal sinuses. Negative orbit Other: None CT CERVICAL SPINE FINDINGS Alignment: Normal Skull base and vertebrae: Negative for cervical spine fracture. Fracture of the left first rib Soft tissues and spinal canal: Negative Disc levels: Mild disc degeneration throughout the cervical spine without significant stenosis. Upper chest: Chest CT reported separately Other: None IMPRESSION: Small amount of acute hemorrhage in the right sylvian fissure presumably posttraumatic given the amount of additional injuries identified. No subdural hemorrhage. Fracture left m first rib Negative for cervical spine fracture These results were called by telephone at the time of interpretation on 07/02/2020 at 6:56 pm to provider [facility] , who verbally acknowledged these results. Electronically Signed   By: Marlan Palau M.D.   On: 07/02/2020 18:57   CT CHEST ABDOMEN PELVIS W CONTRAST  Result Date: 07/02/2020 CLINICAL DATA:  Patient was found on the ground outside and unresponsive. EXAM: CT CHEST, ABDOMEN, AND PELVIS WITH CONTRAST TECHNIQUE: Multidetector CT imaging of the  chest, abdomen and pelvis was performed following the [name] protocol during bolus administration of intravenous contrast. CONTRAST:  OMNIPAQUE IOHEXOL 300 MG/ML  SOLN COMPARISON:  CT chest 07/02/2020 FINDINGS: CT CHEST FINDINGS Cardiovascular: Mild cardiac enlargement. No pericardial effusions. Normal caliber thoracic aorta. No aortic dissection. Minimal aortic calcification. Central pulmonary arteries are patent without evidence of significant pulmonary embolus. Great vessel origins are patent. Mediastinum/Nodes: Esophagus is decompressed. No significant lymphadenopathy. Infiltrative changes in the right supraclavicular, infraclavicular, and retrosternal regions. Lungs/Pleura: Motion artifact limits examination. There are patchy airspace infiltrates throughout both lungs possibly representing edema, pneumonia, or contusions. Small right pleural effusion. Infiltrates are progressing since previous study. Musculoskeletal: Mildly displaced fracture of the midshaft right clavicle. Fracture of the right scapula involving the glenoid with articular involvement at the glenohumeral joint. Fracture of the left first rib. Fractures of the right first, second and fourth ribs. Sternum appears intact. Compression fractures of T7 and T10. T10 fracture extends to the posterior elements resulting in a 3 column fracture, potentially unstable. Fractures were previously described on the prior study. CT ABDOMEN PELVIS FINDINGS Hepatobiliary: Diffuse fatty infiltration of the liver. No focal liver lesions. Mild pericholecystic edema is nonspecific. No cholelithiasis or bile duct dilatation. Pancreas: Unremarkable. No pancreatic ductal dilatation or surrounding inflammatory changes. Spleen: No splenic injury or perisplenic hematoma. Adrenals/Urinary Tract: No adrenal hemorrhage or renal injury identified. Bladder wall is diffusely thickened, possibly due to cystitis or outlet obstruction. Stomach/Bowel: Stomach, small bowel,  and colon are not abnormally distended. No wall thickening or mesenteric infiltration is identified. Appendix is normal. Vascular/Lymphatic: No significant vascular findings are present. No enlarged abdominal or pelvic lymph nodes. Reproductive: Prostate gland is mildly enlarged. Other: No free air or free fluid in the abdomen. Abdominal wall musculature appears intact. Musculoskeletal: Normal alignment of the lumbar spine. No vertebral compression deformities. Posterior elements appear intact. Sacrum, pelvis, and hips appear intact. Heterotopic ossification is seen medial to the proximal left femur. IMPRESSION: 1. Multiple bilateral rib fractures, right scapula, right clavicle, and T7 and T10 fractures again demonstrated as  previously described. 2. Associated contusions in the right supraclavicular and infra clavicular space. Mild retrosternal soft tissue swelling suggesting contusion but no sternal fractures identified. 3. Increasing consolidation or edema in the lungs since prior study. 4. No acute posttraumatic changes are demonstrated in the abdomen or pelvis. No evidence of solid organ injury or bowel perforation. 5. Diffuse bladder wall thickening likely due to cystitis or outlet obstruction. Mild prostate gland enlargement. 6. Diffuse fatty infiltration of the liver. Electronically Signed   By: Burman Nieves M.D.   On: 07/02/2020 20:09   CT T-SPINE NO CHARGE  Result Date: 07/02/2020 CLINICAL DATA:  Found on ground outside.  Unresponsive EXAM: CT THORACIC SPINE WITHOUT CONTRAST TECHNIQUE: Multidetector CT images of the thoracic were obtained using the [name] protocol without intravenous contrast. COMPARISON:  None. FINDINGS: Alignment: Normal Vertebrae: Mild acute fracture of the superior endplate of T7 Complex fracture of T10 vertebral body with mild loss of height. Fracture of the base of the spinous process extending into the lamina bilaterally of T10. No bony canal stenosis due to fracture.  Fracture of the left first rib with mild apical pleural thickening Paraspinal and other soft tissues: No hematoma in the spinal canal. Mild paraspinous edema around the T10 fracture. Disc levels: Disc spaces maintained. No significant spinal stenosis. IMPRESSION: 1. Mild acute fracture superior endplate of T7 2. Complex fracture T10 with mild loss of height. Additional fracture in the posterior elements of T10 involving the bases spinous process and lamina bilaterally. 3. Fracture left first rib with mild apical pleural thickening. 4. These results were called by telephone at the time of interpretation on 07/02/2020 at 6:52 pm to provider [facility] , who verbally acknowledged these results. Electronically Signed   By: Marlan Palau M.D.   On: 07/02/2020 18:52    Review of Systems  Constitutional: Negative.   HENT: Negative.    Eyes: Negative.   Respiratory: Negative.    Cardiovascular: Negative.   Gastrointestinal:  Positive for abdominal pain (with the brace on.).  Endocrine: Negative.   Genitourinary: Negative.   Musculoskeletal:  Positive for back pain and joint swelling.  Skin: Negative.   Neurological:  Positive for syncope.  Psychiatric/Behavioral:         Significant substance use  All other systems reviewed and are negative.   Blood pressure 139/83, pulse 94, temperature 98.9 F (37.2 C), temperature source Oral, resp. rate (!) 22, height 5' (1.524 m), weight 49.9 kg, SpO2 99 %.  Physical Exam Constitutional:      General: He is in acute distress.     Appearance: Normal appearance. He is normal weight. He is ill-appearing. He is not toxic-appearing or diaphoretic.  HENT:     Head: Normocephalic.     Right Ear: External ear normal.     Left Ear: External ear normal.     Nose: Nose normal. No congestion or rhinorrhea.     Mouth/Throat:     Mouth: Mucous membranes are moist.  Eyes:     General: No scleral icterus.    Extraocular Movements: Extraocular movements intact.      Conjunctiva/sclera: Conjunctivae normal.     Pupils: Pupils are equal, round, and reactive to light.  Neck:     Comments: In cervical collar Cardiovascular:     Rate and Rhythm: Normal rate and regular rhythm.     Pulses: Normal pulses.     Heart sounds: Normal heart sounds. No murmur heard.   No friction rub.  Pulmonary:  Effort: Pulmonary effort is normal. No respiratory distress.     Breath sounds: Normal breath sounds. No stridor. No wheezing or rhonchi.  Abdominal:     General: Abdomen is flat. Bowel sounds are normal. There is no distension.     Palpations: Abdomen is soft. There is no mass.     Tenderness: There is no abdominal tenderness.  Musculoskeletal:        General: Swelling, tenderness and signs of injury present.     Cervical back: Neck supple. No tenderness.     Comments: Right shoulder tender, right arm in sling that was malpositioned.  TLSO brace in place, cervical collar in place.    Skin:    Capillary Refill: Capillary refill takes 2 to 3 seconds.  Neurological:     General: No focal deficit present.     Mental Status: He is alert and oriented to person, place, and time.     Cranial Nerves: No cranial nerve deficit.     Sensory: No sensory deficit.     Motor: No weakness.     Coordination: Coordination normal.  Psychiatric:        Mood and Affect: Mood normal.        Behavior: Behavior normal.        Thought Content: Thought content normal.        Judgment: Judgment normal.     Assessment/Plan Fall Acute intracranial hemorrhage T10 body fracture and fx of posterior elements T7 endplate fracture Right first rib fx EtOH intoxication Right scapular fracture Right clavicle fracture Elevated LFTs Hypokalemia Hyperglycemia  Admit to ICU CIWA protocol with thiamine and folate Neurosurg consult - Dr. Conchita ParisNundkumar to see. TLSO brace. Pt has received this.  Will get upright films in brace as patient is not cooperative with bed rest and continues to  walk around in the room.   Ortho consult - Dr. Magnus IvanBlackman to see  Spine precautions - leave c collar on tonight while intoxicated.  Will need TBI therapies consulted once TLSO brace available.   Replete K Hyperglycemia likely stress mediated. Sling for right shoulder injuries.   Interview and exam done with video spanish interpreter.  I stressed the importance of keeping on his brace if he is in any position other than completely flat.  I advised him that he can become paralyzed if he does not comply.  Cervical collar, TLSO, and sling were tightened and fit properly prior to my leaving the room   Maudry DiegoFaera L Aaniyah Strohm, MD FACS Surgical Oncology, General Surgery, Trauma and Critical [facility], [place] [phone] for weekday/non holidays Check [email or web address] for coverage night/weekend/holidays  Do not use SecureChat as it is not reliable for timely patient care.    07/02/2020, 9:45 PM   Procedures

## 2020-07-02 NOTE — ED Notes (Signed)
Attempted to call report requested for me to call back

## 2020-07-02 NOTE — ED Provider Notes (Signed)
Patient was admitted by midlevel.  He has multiple traumatic fractures.  I was called to bedside by nursing staff because the patient was attempting to get out of bed.  He has been placed on strict bedrest due to thoracic spine fractures.  We used the translator to explain this in detail, patient has been instructed to stay laying flat on his back to avoid any further damage/spinal injury.  He verbally states that he understands by using the translator.  Stable at time of this evaluation.   Rozelle Logan, DO 07/02/20 2016

## 2020-07-02 NOTE — ED Notes (Signed)
Provider at bedside

## 2020-07-02 NOTE — ED Provider Notes (Addendum)
[facility] EMERGENCY DEPARTMENT Provider Note   CSN: [id number] Arrival date & time: 07/02/20  1644     History Chief Complaint  Patient presents with   Fall   Alcohol Intoxication   Spanish interpreter was used throughout this evaluation  Samuel [name] Standard is a 50 y.o. male.  HPI  50 year old male with history of EtOH abuse, presents to the emergency department today for evaluation of a fall.  History limited due to patient being intoxicated. He tells me multiple different stories for how he was injured.  He states that he had 2 beers this morning and has been working outside all day.  Per EMS, family last saw him 30 minutes prior to arrival and stated that he had been working in the yard all day and had been drinking today.  Past Medical History:  Diagnosis Date   ETOH abuse     There are no problems to display for this patient.   History reviewed. No pertinent surgical history.     History reviewed. No pertinent family history.  Social History   Tobacco Use   Smoking status: Unknown  Substance Use Topics   Alcohol use: Yes    Alcohol/week: 24.0 standard drinks    Types: 24 Cans of beer per week    Home Medications Prior to Admission medications   Not on File    Allergies    Patient has no known allergies.  Review of Systems   Review of Systems  Constitutional:  Negative for chills and fever.  HENT:  Negative for ear pain and sore throat.   Eyes:  Negative for pain and visual disturbance.  Respiratory:  Negative for cough and shortness of breath.   Cardiovascular:  Positive for chest pain (bilat rib pain). Negative for palpitations.  Gastrointestinal:  Negative for abdominal pain, nausea and vomiting.  Genitourinary:  Negative for dysuria and hematuria.  Musculoskeletal:  Positive for neck pain. Negative for back pain.       Right shoulder/ right upper back pain  Skin:  Positive for wound.  Neurological:        Head  injury  All other systems reviewed and are negative.  Physical Exam Updated Vital Signs BP 122/81   Pulse 93   Temp 98.9 F (37.2 C) (Oral)   Resp (!) 29   Ht 5' (1.524 m)   Wt 49.9 kg   SpO2 98%   BMI 21.48 kg/m   Physical Exam Vitals and nursing note reviewed.  Constitutional:      Appearance: He is well-developed.  HENT:     Head: Normocephalic and atraumatic.  Eyes:     Conjunctiva/sclera: Conjunctivae normal.  Cardiovascular:     Rate and Rhythm: Normal rate and regular rhythm.     Heart sounds: Normal heart sounds. No murmur heard. Pulmonary:     Effort: Pulmonary effort is normal. No respiratory distress.     Breath sounds: Normal breath sounds. No wheezing, rhonchi or rales.  Chest:     Chest wall: Tenderness (bilat rib ttp) present.  Abdominal:     General: Bowel sounds are normal.     Palpations: Abdomen is soft.     Tenderness: There is no abdominal tenderness. There is no guarding or rebound.  Musculoskeletal:     Cervical back: Neck supple.     Comments: Ccollar in place. No point ttp to the cervical or thoracic spine thought there is ecchymosis to the bilat upper back and  across the right scapula. Ttp noted to the right shoulder/clavicle.  Skin:    General: Skin is warm and dry.  Neurological:     Mental Status: He is alert.    ED Results / Procedures / Treatments   Labs (all labs ordered are listed, but only abnormal results are displayed) Labs Reviewed  COMPREHENSIVE METABOLIC PANEL - Abnormal; Notable for the following components:      Result Value   Potassium 3.0 (*)    Glucose, Bld 117 (*)    BUN <5 (*)    Calcium 8.0 (*)    AST 239 (*)    ALT 91 (*)    All other components within normal limits  CBC WITH DIFFERENTIAL/PLATELET - Abnormal; Notable for the following components:   RBC 3.26 (*)    Hemoglobin 10.6 (*)    HCT 31.3 (*)    Lymphs Abs 0.4 (*)    Abs Immature Granulocytes 0.10 (*)    All other components within normal limits   ETHANOL - Abnormal; Notable for the following components:   Alcohol, Ethyl (B) 244 (*)    All other components within normal limits  RAPID URINE DRUG SCREEN, HOSP PERFORMED  TROPONIN I (HIGH SENSITIVITY)  TROPONIN I (HIGH SENSITIVITY)    EKG None  Radiology CT Head Wo Contrast  Result Date: 07/02/2020 CLINICAL DATA:  Found on ground outside.  Unresponsive. EXAM: CT HEAD WITHOUT CONTRAST CT CERVICAL SPINE WITHOUT CONTRAST TECHNIQUE: Multidetector CT imaging of the head and cervical spine was performed following the standard protocol without intravenous contrast. Multiplanar CT image reconstructions of the cervical spine were also generated. COMPARISON:  None. FINDINGS: CT HEAD FINDINGS Brain: Small amount of subarachnoid hemorrhage right sylvian fissure. This measures approximately 1 cm in size and is high density. No additional areas of subdural or subarachnoid hemorrhage identified. Generalized mild atrophy.  Negative for acute infarct or mass. Vascular: Hyperdensity in the right sylvian fissure presumably subarachnoid hemorrhage. This is denser than would be expected for a nonruptured aneurysm. Otherwise no hyperdense vessel. Skull: Negative for fracture Sinuses/Orbits: Mild mucosal edema paranasal sinuses. Negative orbit Other: None CT CERVICAL SPINE FINDINGS Alignment: Normal Skull base and vertebrae: Negative for cervical spine fracture. Fracture of the left first rib Soft tissues and spinal canal: Negative Disc levels: Mild disc degeneration throughout the cervical spine without significant stenosis. Upper chest: Chest CT reported separately Other: None IMPRESSION: Small amount of acute hemorrhage in the right sylvian fissure presumably posttraumatic given the amount of additional injuries identified. No subdural hemorrhage. Fracture left m first rib Negative for cervical spine fracture These results were called by telephone at the time of interpretation on 07/02/2020 at 6:56 pm to provider  [facility] , who verbally acknowledged these results. Electronically Signed   By: Marlan Palau M.D.   On: 07/02/2020 18:57   CT CHEST WO CONTRAST  Result Date: 07/02/2020 CLINICAL DATA:  Suspected chest trauma with moderate to severe bilateral rib pain, right scapular tenderness and right shoulder pain. EXAM: CT CHEST WITHOUT CONTRAST TECHNIQUE: Multidetector CT imaging of the chest was performed following the standard protocol without IV contrast. COMPARISON:  None. FINDINGS: Cardiovascular: Heart size appears within normal limits. No pericardial effusion. Mild aortic atherosclerosis. Mediastinum/Nodes: Normal appearance of the thyroid gland. The trachea appears patent and is midline. Normal appearance of the esophagus. Calcified mediastinal and left hilar lymph nodes compatible with remote granulomatous disease. No enlarged supraclavicular, axillary, mediastinal or hilar lymph nodes. Lungs/Pleura: No pneumothorax identified. Dependent changes with  subsegmental atelectasis noted within the lung bases. Pleural thickening overlies the posterior right lower lobe. No signs of pulmonary contusion or pneumothorax. Calcified granuloma identified within the left upper lobe. Upper Abdomen: No acute abnormality. Musculoskeletal: There are multiple fractures involving the bony thorax including: -Mid shaft of right clavicle fracture is minimally displaced, image 20/1. -Right supraclavicular hematoma is identified, image 6/4. -Right scapular fracture is identified which extends through the anterior glenoid and base of the coracoid process, image 9/1. -Mildly displaced fracture involving the anterior aspect of the right second rib is identified, image 49/1. -Nondisplaced fracture involves the anterior aspect of the right fourth rib, image 83/1. -Mildly comminuted fracture involves the posterior aspect of the left first rib, image 19/1. -mild superior endplate fracture deformity involving the anterior aspect of the T7  vertebral body, image 56/8. -Comminuted, burst fracture involves the T10 vertebra. There is mild loss of the central vertebral body height at this level. Fractures are also noted involving the T10 spinous process which extends into the inferior facets bilaterally. IMPRESSION: 1. Multiple fractures involving the bony thorax are identified. Right clavicle fracture is minimally displaced. Right scapular fracture is identified which extends through the anterior glenoid and base of the coracoid process. 2. Comminuted burst fracture involves the T10 vertebra with mild loss of the central vertebral body height at this level. Fractures are also noted involving the T10 spinous process which extends into the inferior facets bilaterally. 3. Mild superior endplate fracture deformity involving the anterior aspect of the T7 vertebral body. 4. No pneumothorax identified. 5. Aortic atherosclerosis. Aortic Atherosclerosis (ICD10-I70.0). Critical Value/emergent results were called by telephone at the time of interpretation on 07/02/2020 at 7:06 pm to provider [facility] , who verbally acknowledged these results. Contrast enhanced CT of the chest, abdomen and pelvis advised at this time. Electronically Signed   By: Signa Kell M.D.   On: 07/02/2020 19:07   CT Cervical Spine Wo Contrast  Result Date: 07/02/2020 CLINICAL DATA:  Found on ground outside.  Unresponsive. EXAM: CT HEAD WITHOUT CONTRAST CT CERVICAL SPINE WITHOUT CONTRAST TECHNIQUE: Multidetector CT imaging of the head and cervical spine was performed following the standard protocol without intravenous contrast. Multiplanar CT image reconstructions of the cervical spine were also generated. COMPARISON:  None. FINDINGS: CT HEAD FINDINGS Brain: Small amount of subarachnoid hemorrhage right sylvian fissure. This measures approximately 1 cm in size and is high density. No additional areas of subdural or subarachnoid hemorrhage identified. Generalized mild atrophy.   Negative for acute infarct or mass. Vascular: Hyperdensity in the right sylvian fissure presumably subarachnoid hemorrhage. This is denser than would be expected for a nonruptured aneurysm. Otherwise no hyperdense vessel. Skull: Negative for fracture Sinuses/Orbits: Mild mucosal edema paranasal sinuses. Negative orbit Other: None CT CERVICAL SPINE FINDINGS Alignment: Normal Skull base and vertebrae: Negative for cervical spine fracture. Fracture of the left first rib Soft tissues and spinal canal: Negative Disc levels: Mild disc degeneration throughout the cervical spine without significant stenosis. Upper chest: Chest CT reported separately Other: None IMPRESSION: Small amount of acute hemorrhage in the right sylvian fissure presumably posttraumatic given the amount of additional injuries identified. No subdural hemorrhage. Fracture left m first rib Negative for cervical spine fracture These results were called by telephone at the time of interpretation on 07/02/2020 at 6:56 pm to provider [facility] , who verbally acknowledged these results. Electronically Signed   By: Marlan Palau M.D.   On: 07/02/2020 18:57   CT T-SPINE NO CHARGE  Result  Date: 07/02/2020 CLINICAL DATA:  Found on ground outside.  Unresponsive EXAM: CT THORACIC SPINE WITHOUT CONTRAST TECHNIQUE: Multidetector CT images of the thoracic were obtained using the standard protocol without intravenous contrast. COMPARISON:  None. FINDINGS: Alignment: Normal Vertebrae: Mild acute fracture of the superior endplate of T7 Complex fracture of T10 vertebral body with mild loss of height. Fracture of the base of the spinous process extending into the lamina bilaterally of T10. No bony canal stenosis due to fracture. Fracture of the left first rib with mild apical pleural thickening Paraspinal and other soft tissues: No hematoma in the spinal canal. Mild paraspinous edema around the T10 fracture. Disc levels: Disc spaces maintained. No significant  spinal stenosis. IMPRESSION: 1. Mild acute fracture superior endplate of T7 2. Complex fracture T10 with mild loss of height. Additional fracture in the posterior elements of T10 involving the bases spinous process and lamina bilaterally. 3. Fracture left first rib with mild apical pleural thickening. 4. These results were called by telephone at the time of interpretation on 07/02/2020 at 6:52 pm to provider [facility] , who verbally acknowledged these results. Electronically Signed   By: Marlan Palauharles  Clark M.D.   On: 07/02/2020 18:52    Procedures .Marland Kitchen.Laceration Repair  Date/Time: 07/03/2020 10:09 AM Performed by: Karrie Meresouture, Rim Thatch S, PA-C Authorized by: Karrie Meresouture, Joseth Weigel S, PA-C   Consent:    Consent obtained:  Verbal   Consent given by:  Patient   Risks, benefits, and alternatives were discussed: yes     Risks discussed:  Infection and pain   Alternatives discussed:  No treatment Universal protocol:    Procedure explained and questions answered to patient or proxy's satisfaction: yes     Immediately prior to procedure, a time out was called: yes     Patient identity confirmed:  Verbally with patient Anesthesia:    Anesthesia method:  None Laceration details:    Location:  Scalp   Scalp location:  Crown   Length (cm):  2 Pre-procedure details:    Preparation:  Patient was prepped and draped in usual sterile fashion and imaging obtained to evaluate for foreign bodies Exploration:    Hemostasis achieved with:  Direct pressure   Imaging outcome: foreign body not noted     Wound exploration: wound explored through full range of motion and entire depth of wound visualized     Contaminated: no   Treatment:    Area cleansed with:  Povidone-iodine   Amount of cleaning:  Standard Skin repair:    Repair method:  Staples   Number of staples:  2 Approximation:    Approximation:  Close Repair type:    Repair type:  Simple Post-procedure details:    Dressing:  Open (no dressing)   Procedure  completion:  Tolerated   CRITICAL CARE Performed by: Karrie Meresortni S Tynslee Bowlds   Total critical care time: 47 minutes  Critical care time was exclusive of separately billable procedures and treating other patients.  Critical care was necessary to treat or prevent imminent or life-threatening deterioration.  Critical care was time spent personally by me on the following activities: development of treatment plan with patient and/or surrogate as well as nursing, discussions with consultants, evaluation of patient's response to treatment, examination of patient, obtaining history from patient or surrogate, ordering and performing treatments and interventions, ordering and review of laboratory studies, ordering and review of radiographic studies, pulse oximetry and re-evaluation of patient's condition.   Medications Ordered in ED Medications  sodium chloride 0.9 % bolus  1,000 mL (has no administration in time range)  Tdap (BOOSTRIX) injection 0.5 mL (0.5 mLs Intramuscular Given 07/02/20 1749)  fentaNYL (SUBLIMAZE) injection 50 mcg (50 mcg Intravenous Given 07/02/20 1950)  iohexol (OMNIPAQUE) 300 MG/ML solution 100 mL (100 mLs Intravenous Contrast Given 07/02/20 1940)    ED Course  I have reviewed the triage vital signs and the nursing notes.  Pertinent labs & imaging results that were available during my care of the patient were reviewed by me and considered in my medical decision making (see chart for details).    MDM Rules/Calculators/A&P                          51 y/o male presenting to the ed today for eval of fall and intoxication  Reviewed/interpreted  CBC with mild anemia, no leukocytosis CMP with mild hypokalemia, elevated lfts Etoh elevated at 244 UDS neg Trop neg   CT head - Small amount of acute hemorrhage in the right sylvian fissure presumably posttraumatic given the amount of additional injuries identified. No subdural hemorrhage. Fracture left m first rib Negative for  cervical spine fracture CT cervical spine - Small amount of acute hemorrhage in the right sylvian fissure presumably posttraumatic given the amount of additional injuries identified. No subdural hemorrhage. Fracture left first rib. Negative for cervical spine fracture CT chest - 1. Multiple fractures involving the bony thorax are identified. Right clavicle fracture is minimally displaced. Right scapular fracture is identified which extends through the anterior glenoid and base of the coracoid process. 2. Comminuted burst fracture involves the T10 vertebra with mild loss of the central vertebral body height at this level. Fractures are also noted involving the T10 spinous process which extends into the inferior facets bilaterally. 3. Mild superior endplate fracture deformity involving the anterior aspect of the T7 vertebral body. 4. No pneumothorax identified. 5. Aortic atherosclerosis. CT t spine 1. Mild acute fracture superior endplate of T7 2. Complex fracture T10 with mild loss of height. Additional fracture in the posterior elements of T10 involving the bases spinous process and lamina bilaterally. 3. Fracture left first rib with mild apical pleural thickening.  7:30 PM CONSULT with Dr. Conchita Paris who recommends having the patient be placed on bedrest at this time until he can be placed in a TLSO brace.  He states that there is no role for surgical intervention for his intracranial bleeding.  CT chest/abd/pelvis w/ contrast added to w/u, trauma paged for admission and these imaging studies were pending on admission.   7:31 PM CONSULT with Dr. Donell Beers who accepts patient for admission.  She will discuss with orthopedics about the rest of the patient's fractures.   Final Clinical Impression(s) / ED Diagnoses Final diagnoses:  Fall  Pain  Trauma    Rx / DC Orders ED Discharge Orders     None        Rayne Du 07/02/20 1957    Koleen Distance, MD 07/03/20 0847     Karrie Meres, PA-C 07/03/20 1011    Koleen Distance, MD 07/03/20 [phone]

## 2020-07-02 NOTE — Progress Notes (Signed)
Orthopedic Tech Progress Note Patient Details:  Samuel [name] September 15, 1970 [id number]  Ortho Devices Type of Ortho Device: Shoulder immobilizer Ortho Device/Splint Location: RUE Ortho Device/Splint Interventions: Ordered, Application, Adjustment   Post Interventions Patient Tolerated: Fair Instructions Provided: Adjustment of device, Care of device, Poper ambulation with device  [name] 07/02/2020, 9:02 PM

## 2020-07-02 NOTE — ED Notes (Signed)
At bedside with provider and interpretar. Pt  Advised of all fx and that he needed to lay still in the bed

## 2020-07-02 NOTE — ED Notes (Signed)
Received verbal report from Andrew F RN at this time 

## 2020-07-02 NOTE — ED Notes (Signed)
Spoke with provider pt wheelchair to restroom at this time. Per pt he needed to have BM

## 2020-07-02 NOTE — ED Notes (Signed)
Patient transported to CT 

## 2020-07-02 NOTE — ED Notes (Signed)
Returned from ct at this time ?

## 2020-07-02 NOTE — ED Triage Notes (Signed)
BIB EMS from home. Family found patient on the ground outside unresponsive. Family last saw him 30 minutes prior. EMS states that he had been drinking and working in the yard all day, Aox1 (self). C/o upper and middle back pain. C-collar on at this time. Spanish speaking only
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
## 2020-07-03 ENCOUNTER — Encounter (HOSPITAL_COMMUNITY): Payer: Self-pay

## 2020-07-03 ENCOUNTER — Inpatient Hospital Stay (HOSPITAL_COMMUNITY): Payer: Self-pay

## 2020-07-03 DIAGNOSIS — R55 Syncope and collapse: Secondary | ICD-10-CM

## 2020-07-03 LAB — CBC
HCT: 28.8 % — ABNORMAL LOW (ref 39.0–52.0)
Hemoglobin: 9.9 g/dL — ABNORMAL LOW (ref 13.0–17.0)
MCH: 32.6 pg (ref 26.0–34.0)
MCHC: 34.4 g/dL (ref 30.0–36.0)
MCV: 94.7 fL (ref 80.0–100.0)
Platelets: 151 10*3/uL (ref 150–400)
RBC: 3.04 MIL/uL — ABNORMAL LOW (ref 4.22–5.81)
RDW: 15.1 % (ref 11.5–15.5)
WBC: 6.7 10*3/uL (ref 4.0–10.5)
nRBC: 0 % (ref 0.0–0.2)

## 2020-07-03 LAB — COMPREHENSIVE METABOLIC PANEL
ALT: 92 U/L — ABNORMAL HIGH (ref 0–44)
AST: 193 U/L — ABNORMAL HIGH (ref 15–41)
Albumin: 3.7 g/dL (ref 3.5–5.0)
Alkaline Phosphatase: 77 U/L (ref 38–126)
Anion gap: 13 (ref 5–15)
BUN: <5 mg/dL — ABNORMAL LOW (ref 6–20)
CO2: 25 mmol/L (ref 22–32)
Calcium: 8 mg/dL — ABNORMAL LOW (ref 8.9–10.3)
Chloride: 96 mmol/L — ABNORMAL LOW (ref 98–111)
Creatinine, Ser: 0.53 mg/dL — ABNORMAL LOW (ref 0.61–1.24)
GFR, Estimated: >60 mL/min (ref >60)
Glucose, Bld: 132 mg/dL — ABNORMAL HIGH (ref 70–99)
Potassium: 3.4 mmol/L — ABNORMAL LOW (ref 3.5–5.1)
Sodium: 134 mmol/L — ABNORMAL LOW (ref 135–145)
Total Bilirubin: 0.9 mg/dL (ref 0.3–1.2)
Total Protein: 6.9 g/dL (ref 6.5–8.1)

## 2020-07-03 LAB — RESP PANEL BY RT-PCR (FLU A&B, COVID) ARPGX2
Influenza A by PCR: NEGATIVE
Influenza B by PCR: NEGATIVE
SARS Coronavirus 2 by RT PCR: NEGATIVE

## 2020-07-03 LAB — MAGNESIUM: Magnesium: 1.2 mg/dL — ABNORMAL LOW (ref 1.7–2.4)

## 2020-07-03 LAB — MRSA NEXT GEN BY PCR, NASAL: MRSA by PCR Next Gen: NOT DETECTED

## 2020-07-03 LAB — ECHOCARDIOGRAM COMPLETE
Area-P 1/2: 3.27 cm2
S' Lateral: 3.1 cm

## 2020-07-03 LAB — PHOSPHORUS: Phosphorus: 3 mg/dL (ref 2.5–4.6)

## 2020-07-03 LAB — HIV ANTIBODY (ROUTINE TESTING W REFLEX): HIV Screen 4th Generation wRfx: NONREACTIVE

## 2020-07-03 IMAGING — MR MR CERVICAL SPINE W/O CM
5 series · 42 of 48 positions shown · non-contrast
Comparison: CT of the cervical spine [DATE]

CLINICAL DATA: Found at home after a presumed fall. Right arm and
back pain.

EXAM:
MRI CERVICAL SPINE WITHOUT CONTRAST
TECHNIQUE: Multiplanar, multisequence MR imaging of the cervical spine was
performed. No intravenous contrast was administered.

[Series 1: T2 · sagittal · 3.0mm · 0.69mm/px · 5 of 15 slices shown (1 of 2)]
[im 1/15]
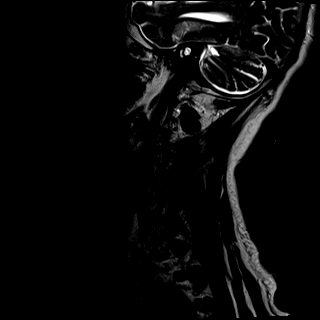
[im 4/15]
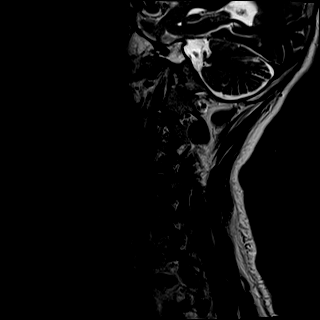
[im 8/15]
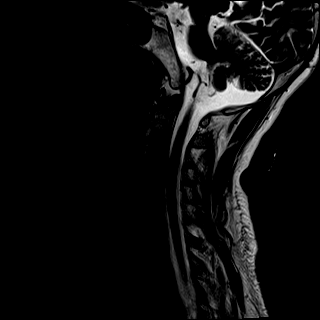
[im 11/15]
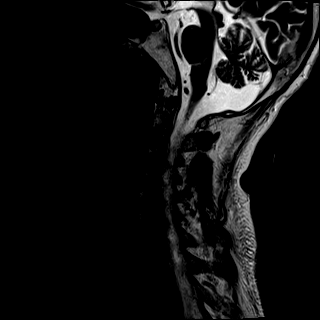
[im 15/15]
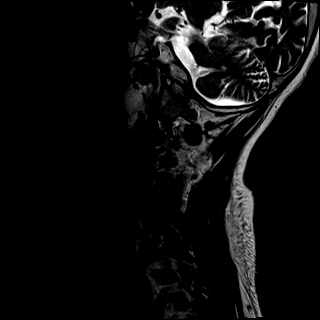

[Series 2: T1 · sagittal · 3.0mm · 0.69mm/px · 5 of 15 slices shown]
[im 1/15]
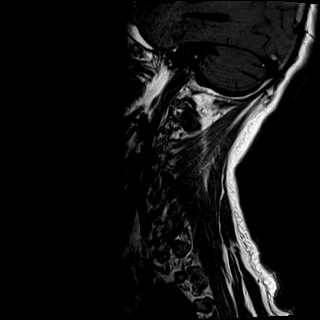
[im 4/15]
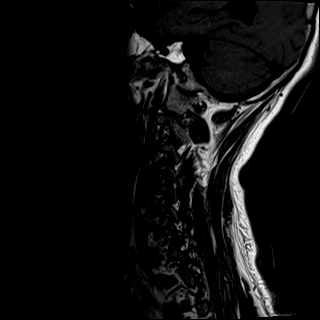
[im 8/15]
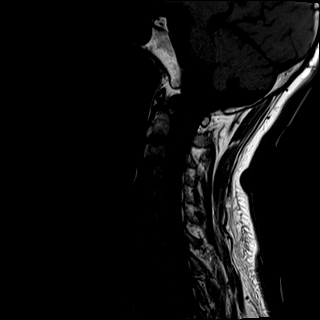
[im 11/15]
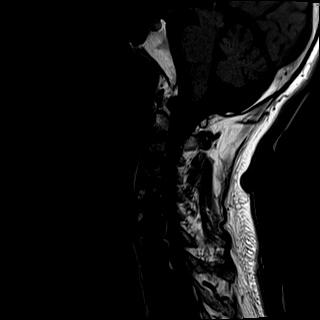
[im 15/15]
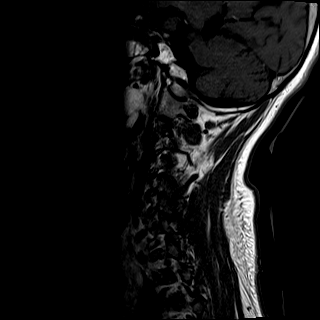

[Series 3: STIR · sagittal · 3.0mm · 0.86mm/px · 6 of 15 slices shown]
[im 1/15]
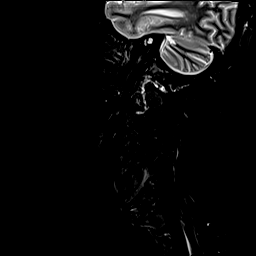
[im 3/15]
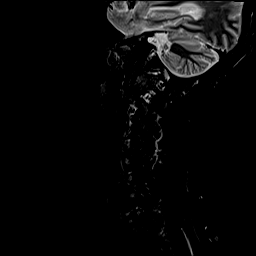
[im 6/15]
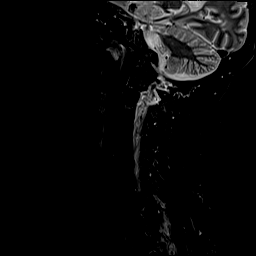
[im 9/15]
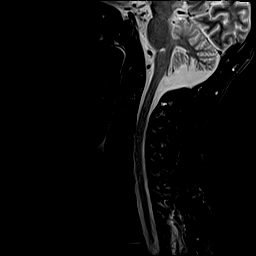
[im 12/15]
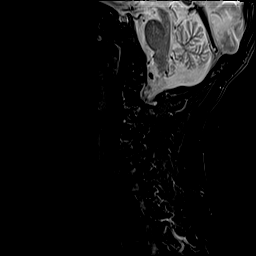
[im 15/15]
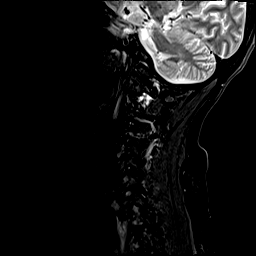

[Series 4: T2 · axial · 3.0mm · 0.78mm/px · z∈[-82,+50]mm · 16 of 42 slices shown (2 of 2)]
[im 1/42]
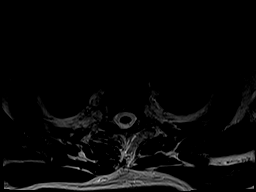
[im 3/42]
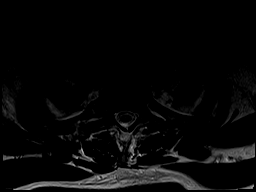
[im 6/42]
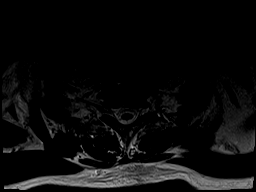
[im 9/42]
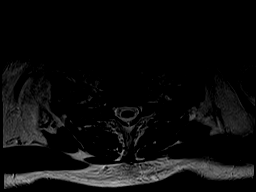
[im 11/42]
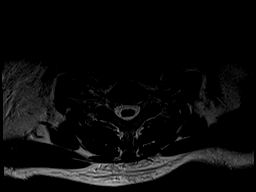
[im 14/42]
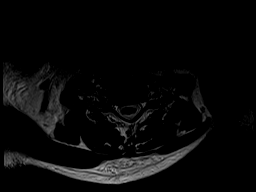
[im 17/42]
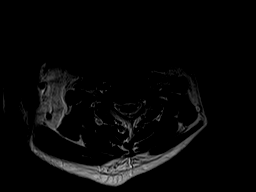
[im 20/42]
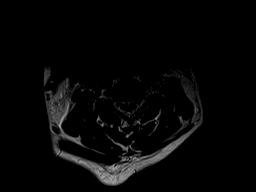
[im 22/42]
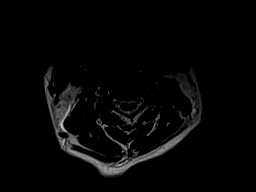
[im 25/42]
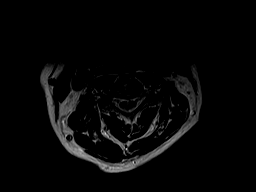
[im 28/42]
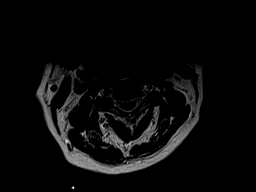
[im 31/42]
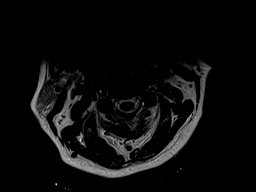
[im 33/42]
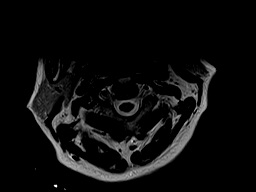
[im 36/42]
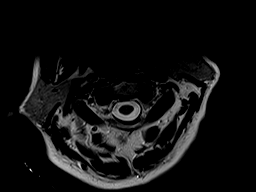
[im 39/42]
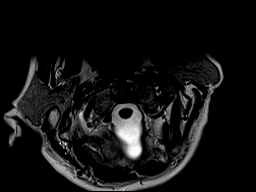
[im 42/42]
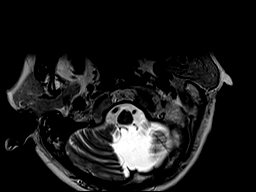

[Series 6: GRE · axial · 3.0mm · 0.35mm/px · z∈[-84,+48]mm · 10 of 42 slices shown]
[im 1/42]
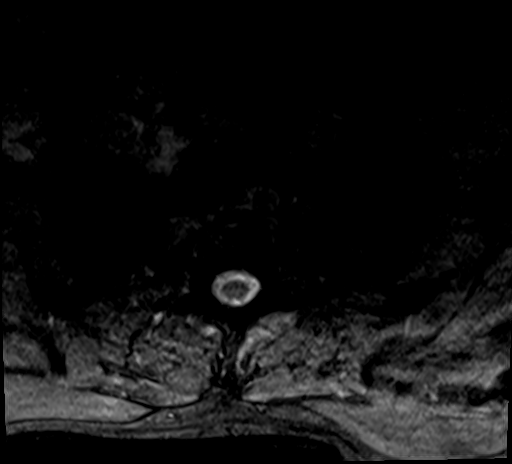
[im 3/42]
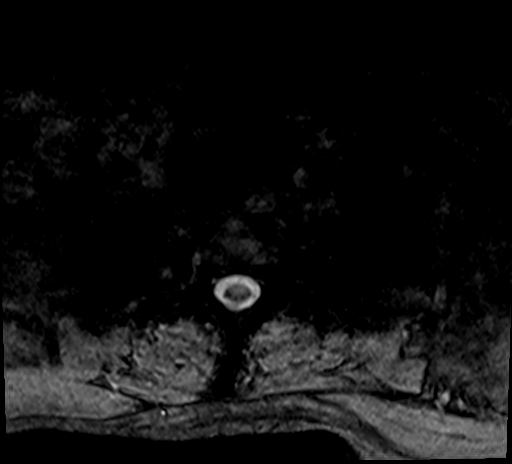
[im 6/42]
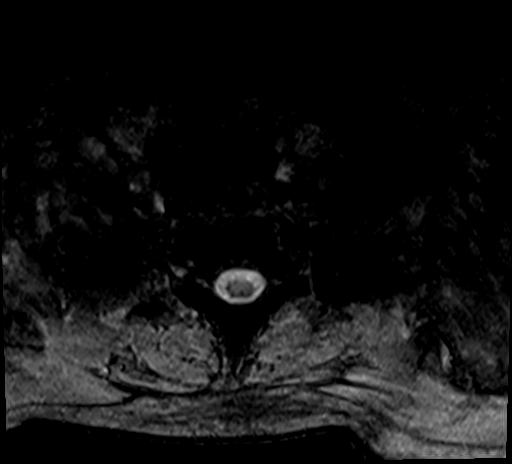
[im 9/42]
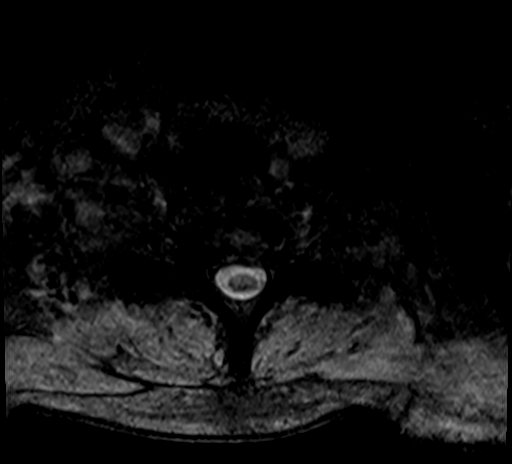
[im 14/42]
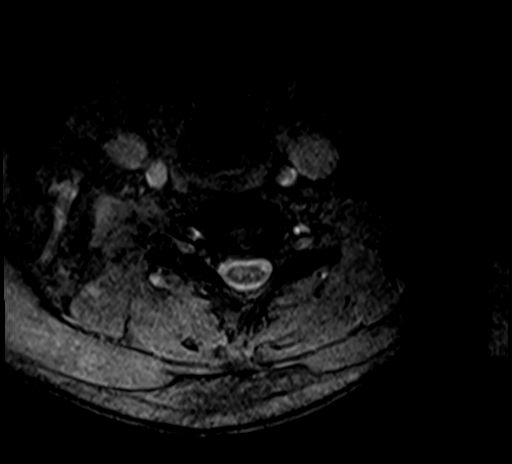
[im 20/42]
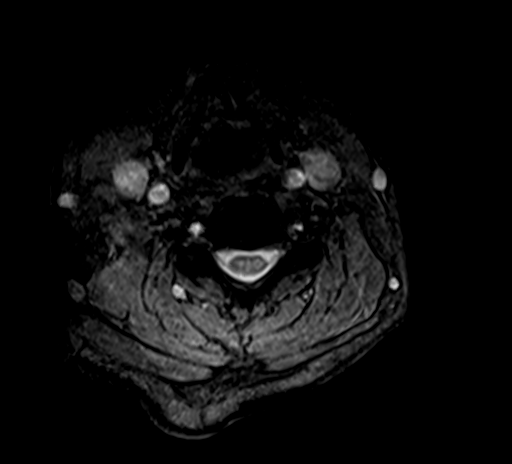
[im 25/42]
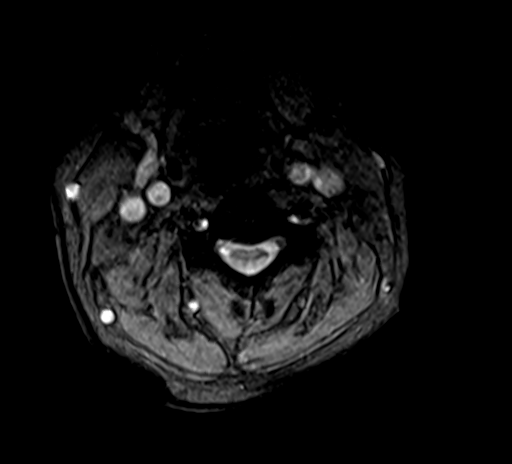
[im 31/42]
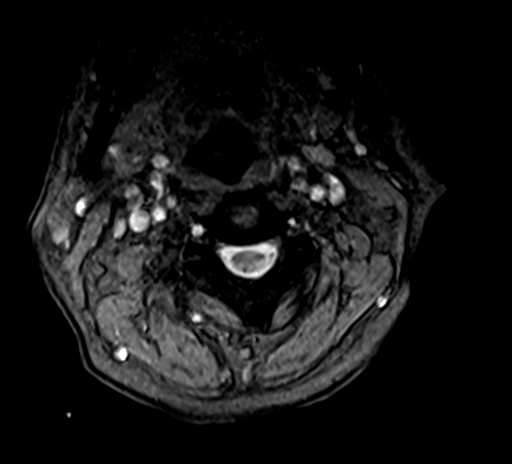
[im 36/42]
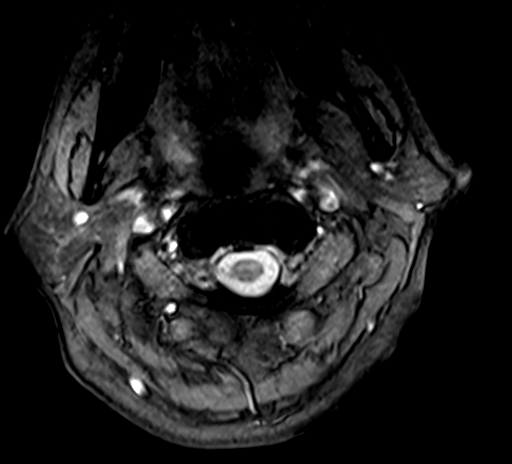
[im 42/42]
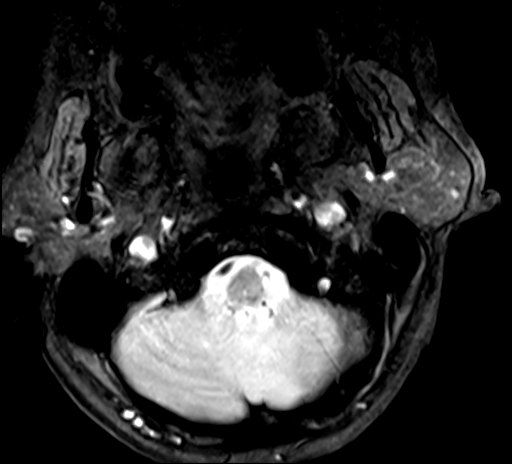

[42 of 48 positions shown; findings below may reference images not displayed]

FINDINGS: Alignment: Slight degenerative retrolisthesis present at C3-4. No
other significant listhesis.

Vertebrae: Marrow signal and vertebral body heights are normal. No
acute fractures are present.

Cord: Normal signal and morphology.

Posterior Fossa, vertebral arteries, paraspinal tissues:
Craniocervical junction is normal. Flow is present in the vertebral
arteries bilaterally. Visualized intracranial contents are normal.
Prevertebral soft tissues are within normal limits. No ligamentous
injuries are present.

Disc levels:

C2-3: Negative.

C3-4: Shallow central disc protrusion is present. No significant
stenosis is present.

C4-5: Negative.

C5-6: Mild uncovertebral and foraminal narrowing is present
bilaterally.

C6-7: Mild uncovertebral and foraminal narrowing is present
bilaterally.

C7-T1: Negative.
IMPRESSION: 1. No acute or healing trauma.
2. Mild uncovertebral and foraminal narrowing bilaterally at C5-6
and C6-7.
3. Shallow central disc protrusion at C3-4 without significant
stenosis.

## 2020-07-03 IMAGING — CT CT HEAD W/O CM
4 series · 15 of 47 positions shown, 17 images · non-contrast
Comparison: CT head without contrast [DATE]

CLINICAL DATA: Subarachnoid hemorrhage.

EXAM:
CT HEAD WITHOUT CONTRAST
TECHNIQUE: Contiguous axial images were obtained from the base of the skull
through the vertex without intravenous contrast.

[Series 3: head bone · axial · 0.45mm/px · z∈[-188,-172]mm · 2 of 83 slices shown]
[im 9/83  bone]
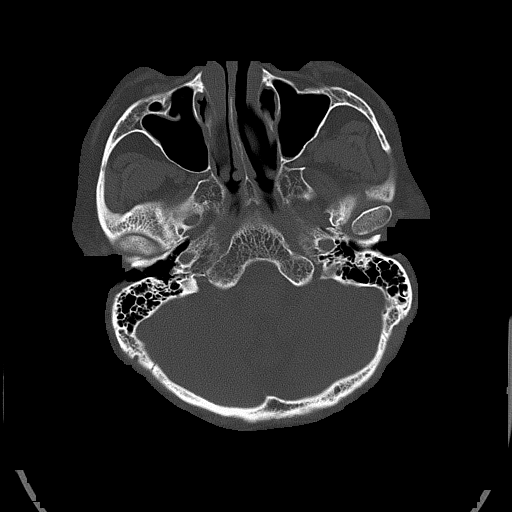
[im 17/83  bone]
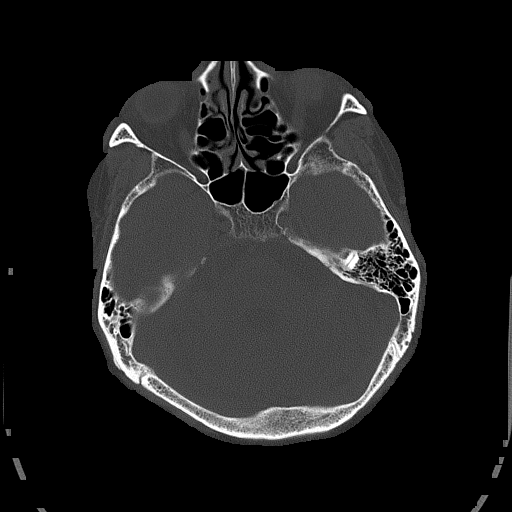

[Series 4: head without · axial · non-contrast · 0.45mm/px · z∈[-184,-64]mm · 7 of 34 slices shown, 9 images]
[im 5/34  brain]
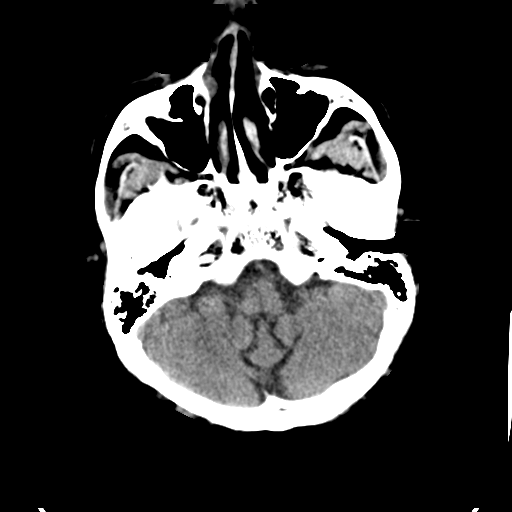
[im 5/34  bone]
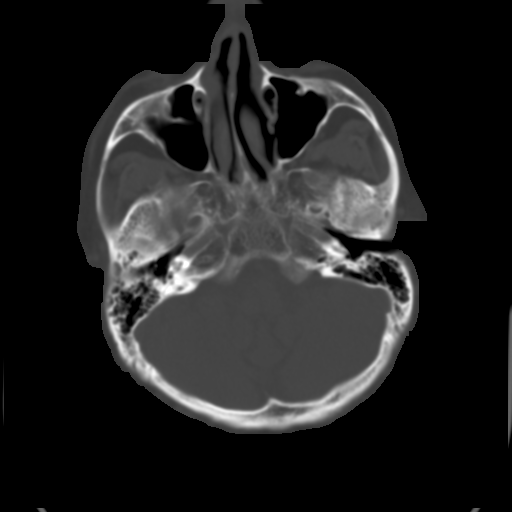
[im 9/34  brain]
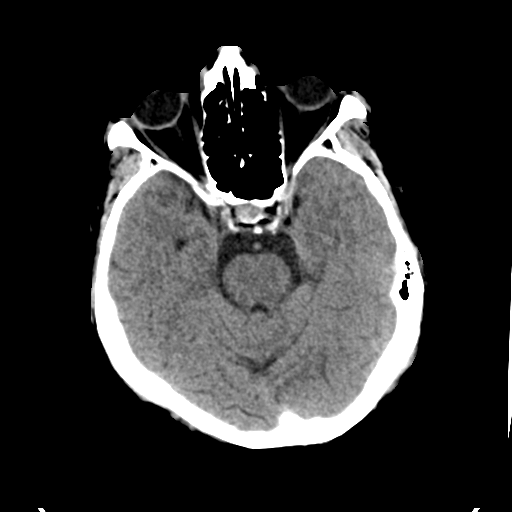
[im 13/34  brain]
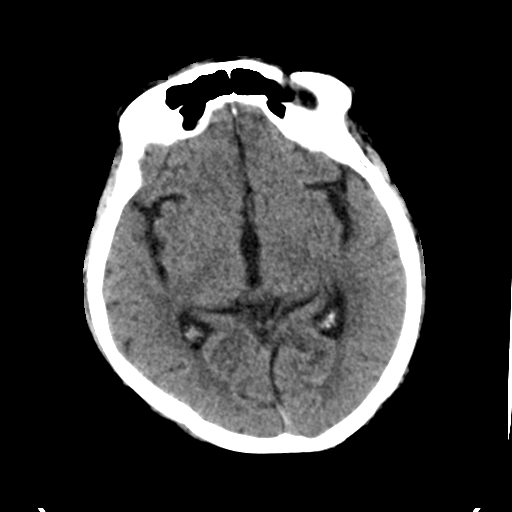
[im 17/34  brain]
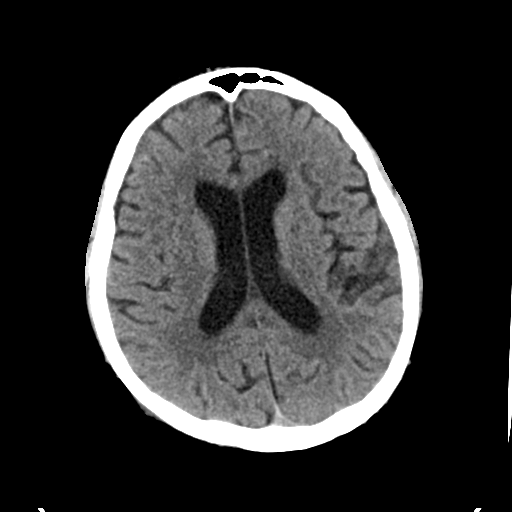
[im 21/34  brain]
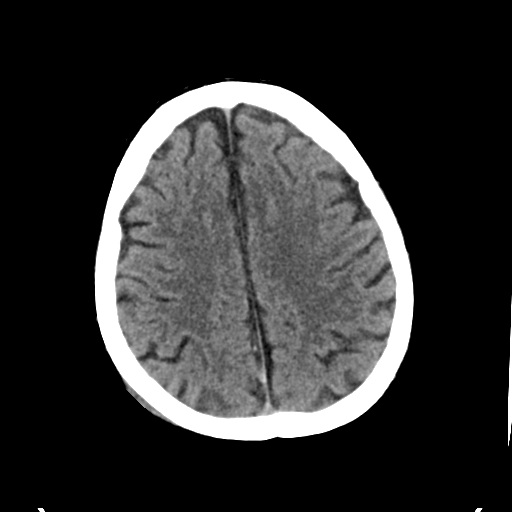
[im 21/34  bone]
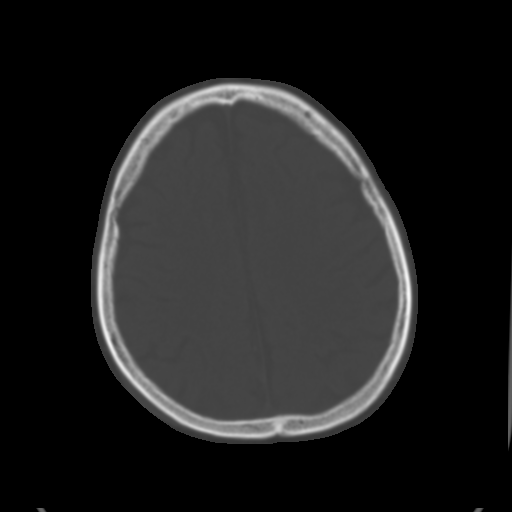
[im 25/34  brain]
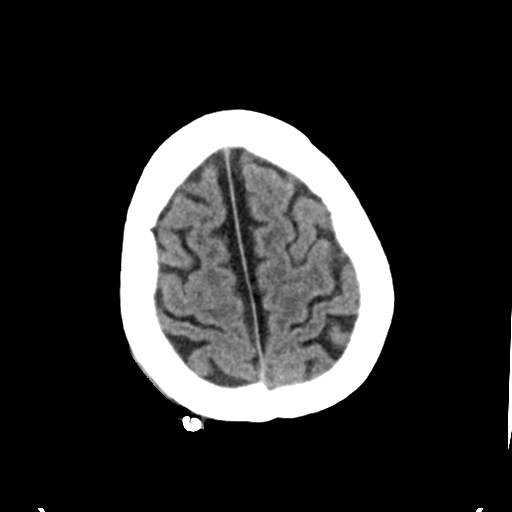
[im 29/34  brain]
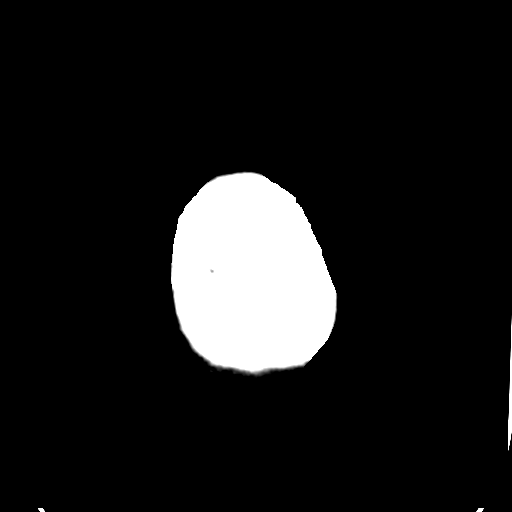

[Series 5: head without cor · coronal · non-contrast · 0.29mm/px · 3 of 72 slices shown]
[im 26/72  brain]
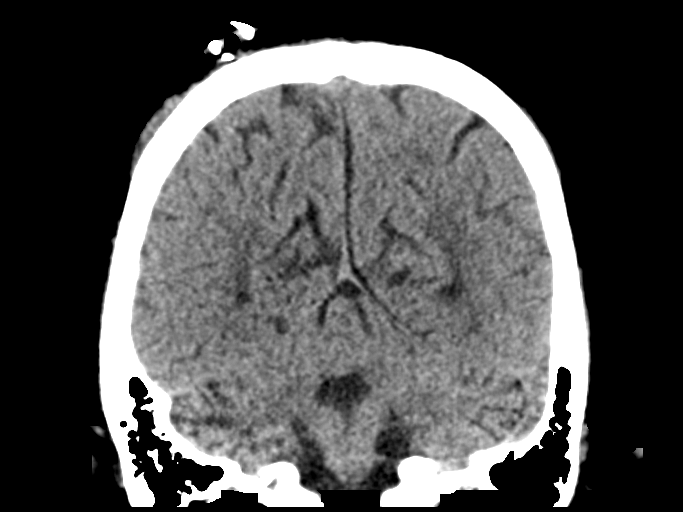
[im 33/72  brain]
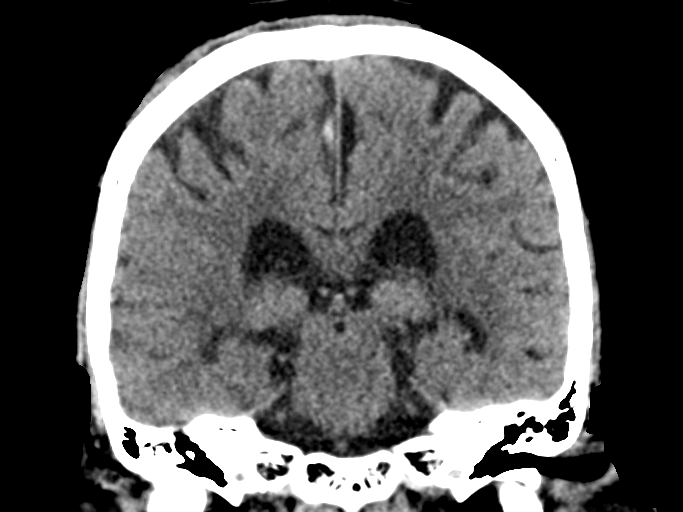
[im 39/72  brain]
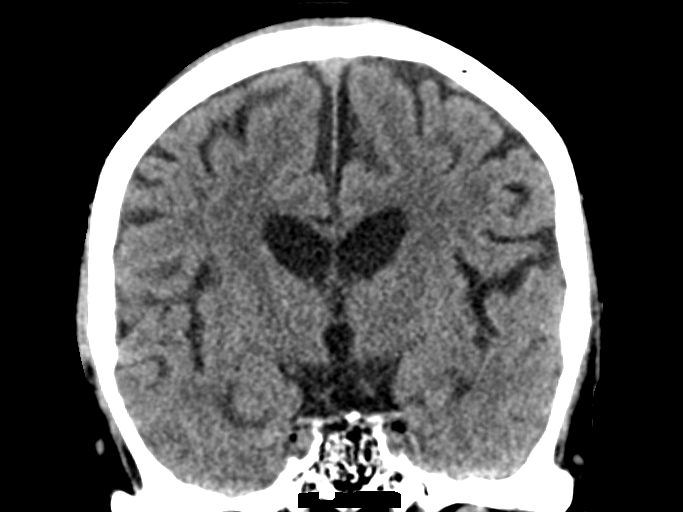

[Series 6: head without sag · sagittal · non-contrast · 0.32mm/px · 3 of 67 slices shown]
[im 23/67  brain]
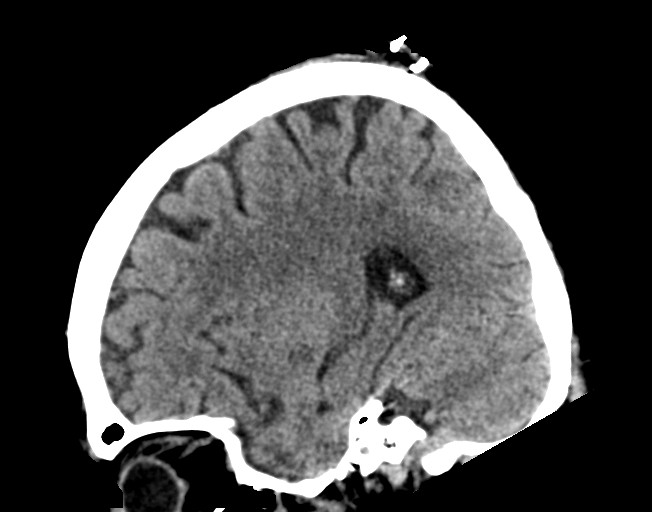
[im 34/67  brain]
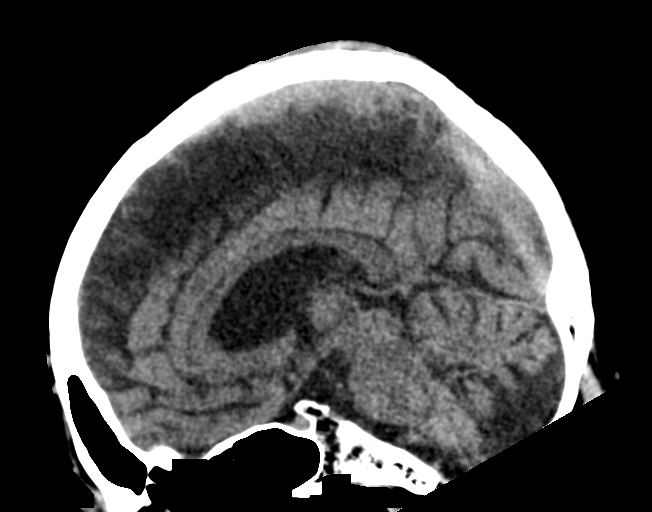
[im 45/67  brain]
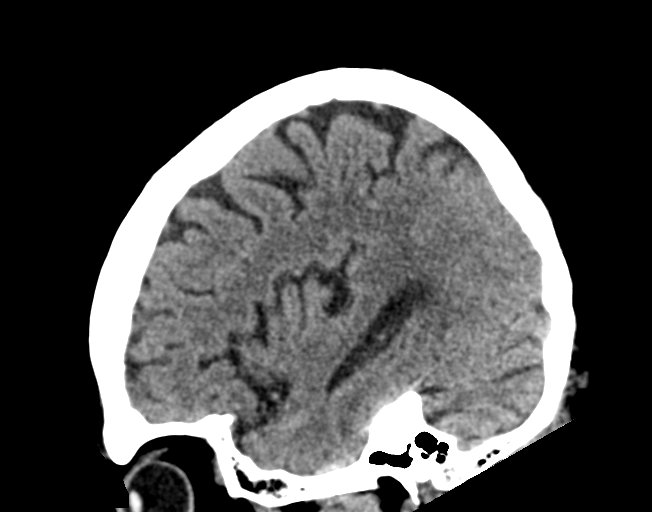

[15 of 47 positions shown; findings below may reference images not displayed]

FINDINGS: Brain: Focal subarachnoid hemorrhage in the right sylvian fissure is
stable. Areas of hyperdensity noted along the falx, likely
representing subdural blood, more prominent than on the prior study.
No new or progressive hemorrhage. No parenchymal hemorrhage. Mild
atrophy and white matter changes are stable.

Vascular: No hyperdense vessel or unexpected calcification.

Skull: Right parietal scalp soft tissue swelling is present without
underlying fracture.

Sinuses/Orbits: The paranasal sinuses and mastoid air cells are
clear. The globes and orbits are within normal limits.
IMPRESSION: 1. Stable focal subarachnoid hemorrhage in the right Sylvian
fissure.
2. Slight increase in subdural blood along the falx.
3. No other new or progressive hemorrhage.
4. Right parietal scalp soft tissue swelling without underlying
fracture.
5. Stable atrophy and white matter disease.

## 2020-07-03 IMAGING — MR MR THORACIC SPINE W/O CM
5 of 6 series · 26 of 48 positions shown · non-contrast
Comparison: None.

CLINICAL DATA: Found at home on the ground after presumed fall.
Back pain.

EXAM:
MRI THORACIC SPINE WITHOUT CONTRAST
TECHNIQUE: Multiplanar, multisequence MR imaging of the thoracic spine was
performed. No intravenous contrast was administered.

[Series 37: T1 · sagittal · 3.8mm · 0.62mm/px · 4 of 11 slices shown (1 of 2)]
[im 1/11]
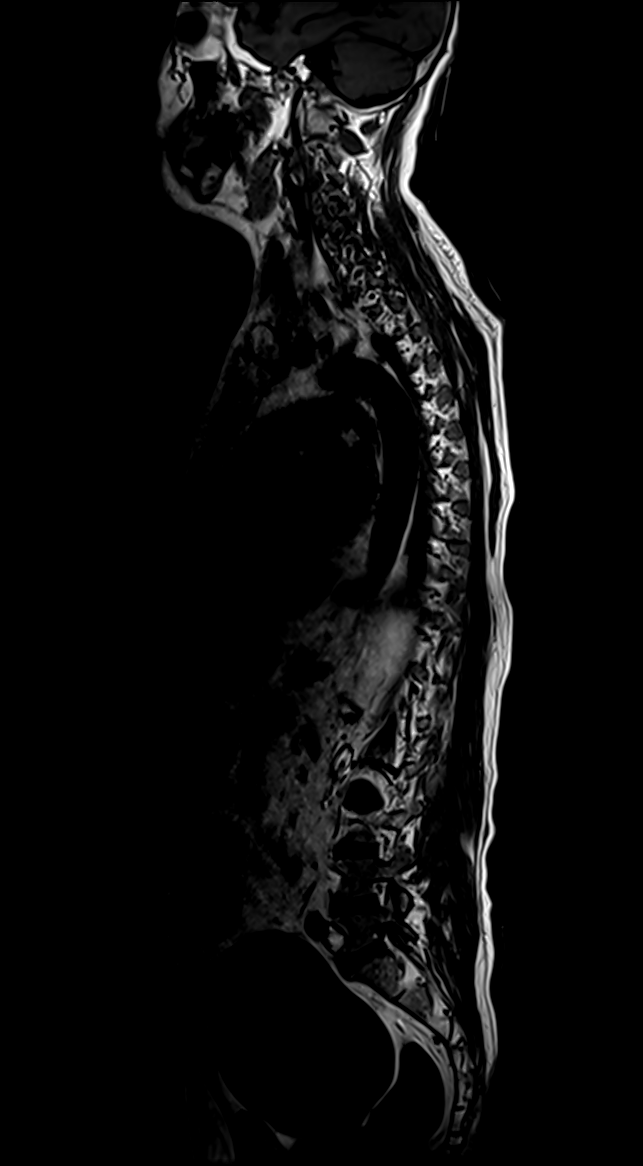
[im 4/11]
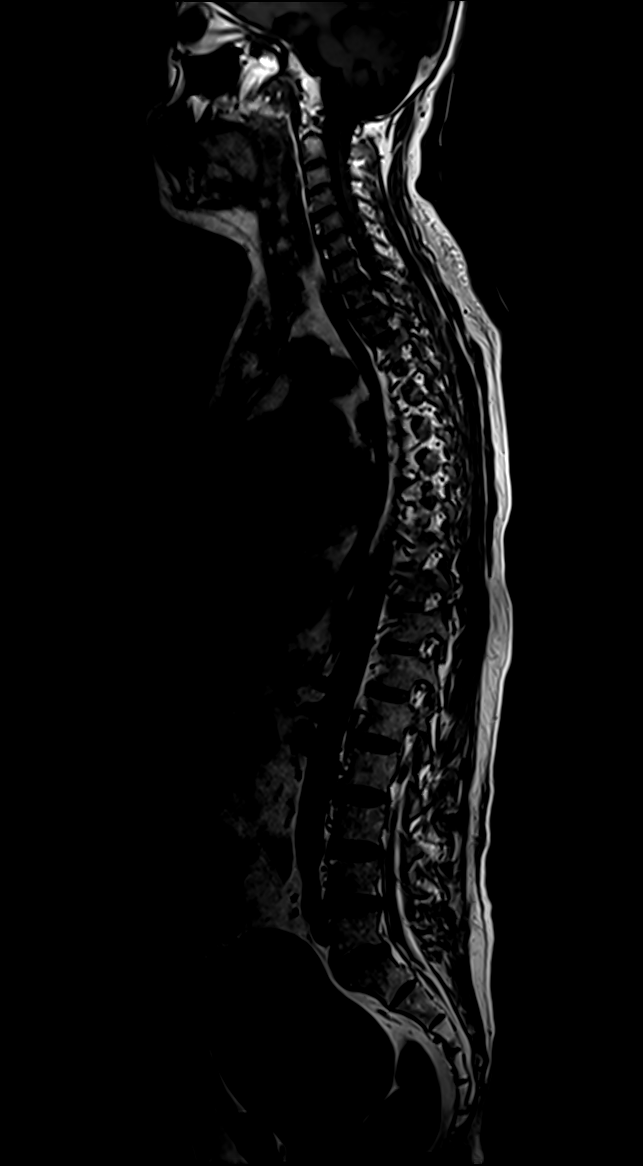
[im 7/11]
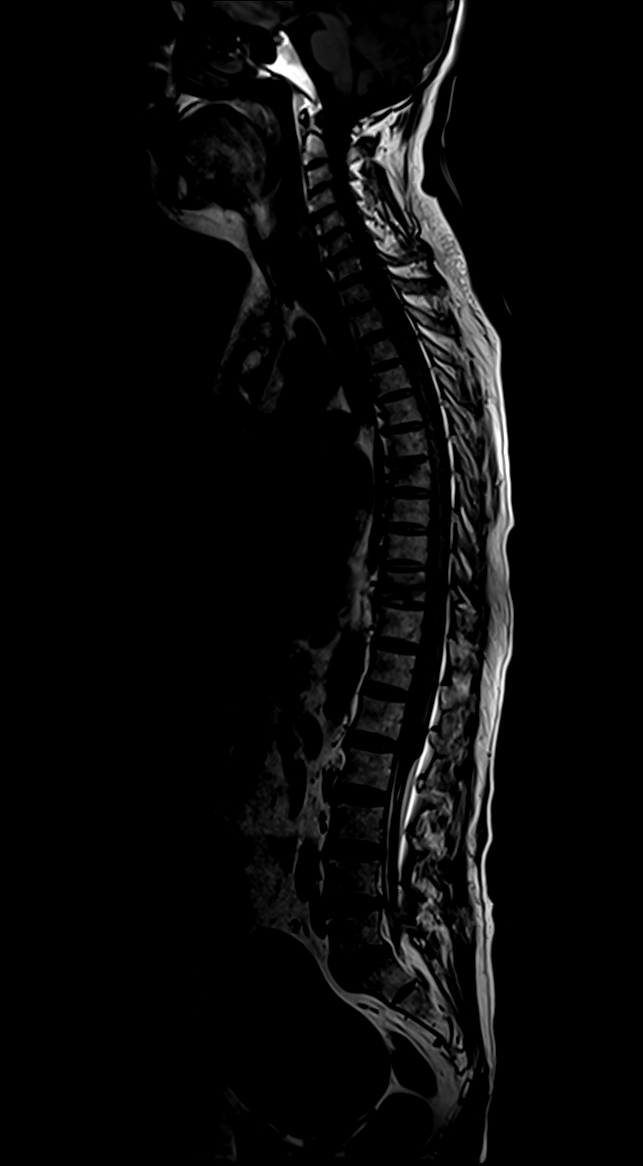
[im 11/11]
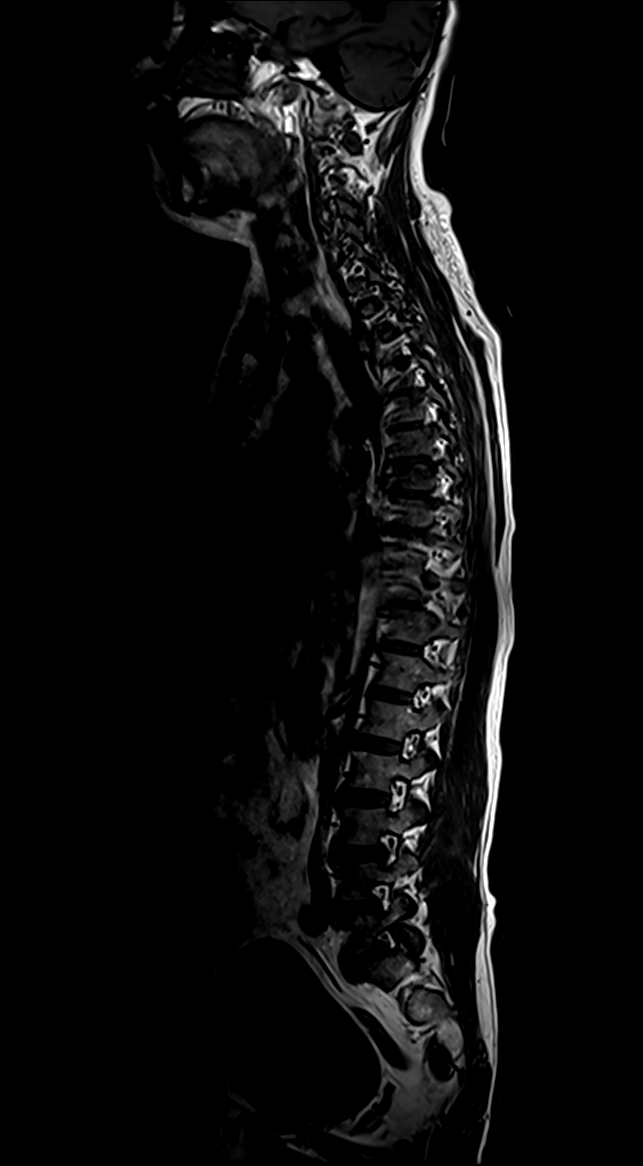

[Series 38: T2 · sagittal · 3.0mm · 1.00mm/px · 6 of 17 slices shown (1 of 2)]
[im 1/17]
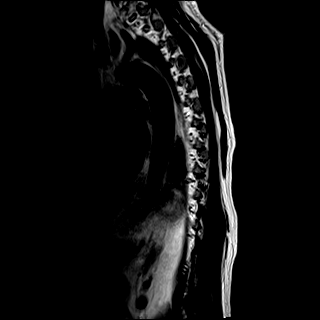
[im 4/17]
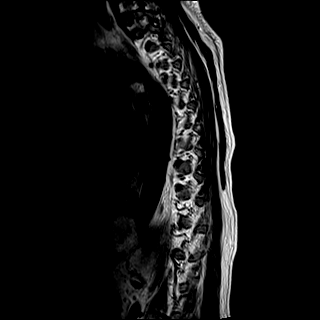
[im 7/17]
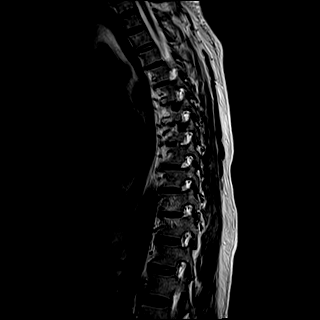
[im 10/17]
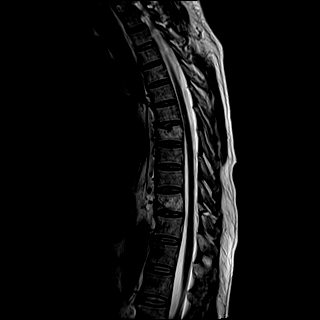
[im 13/17]
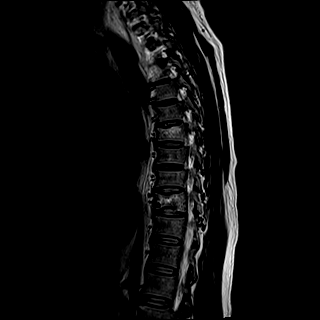
[im 17/17]
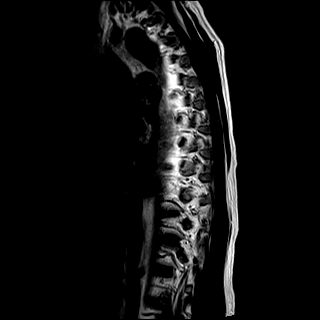

[Series 39: STIR · sagittal · 3.0mm · 0.53mm/px · 1 of 17 slices shown]
[im 1/17]
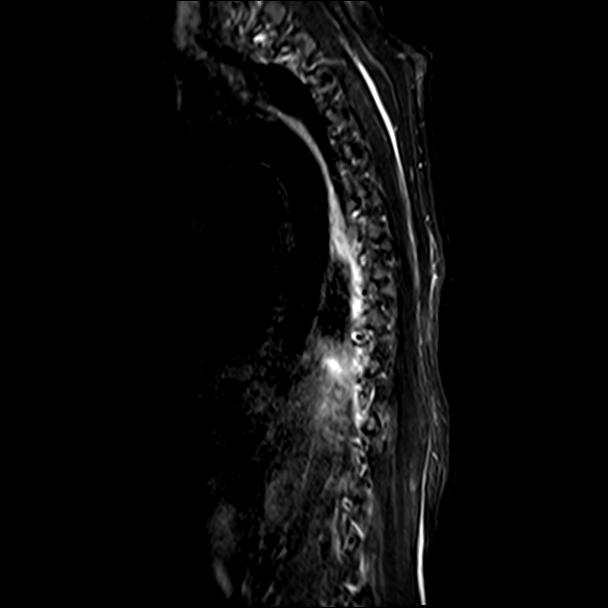

[Series 40: T2 · axial · 5.0mm · 0.70mm/px · z∈[-293,-87]mm · 9 of 36 slices shown (2 of 2)]
[im 1/36]
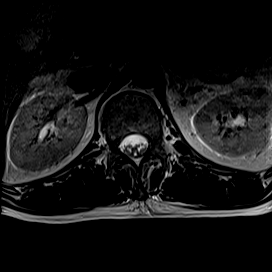
[im 6/36]
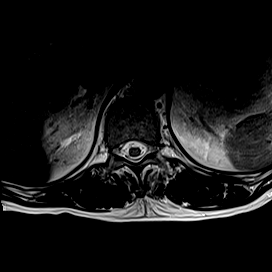
[im 12/36]
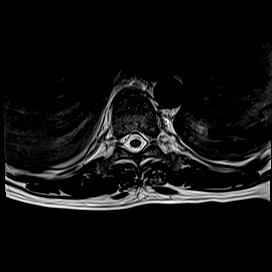
[im 15/36]
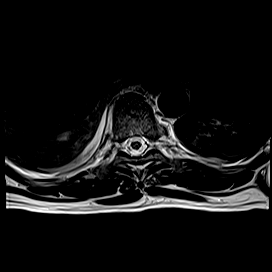
[im 18/36]
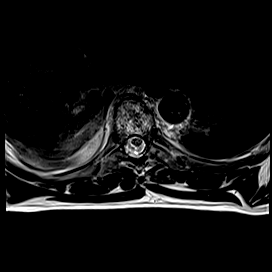
[im 21/36]
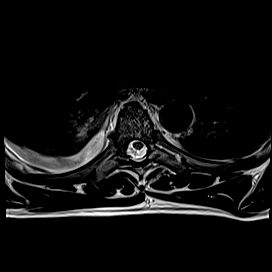
[im 24/36]
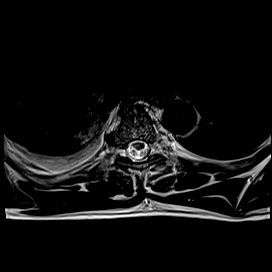
[im 30/36]
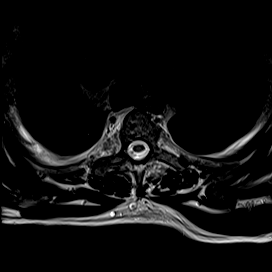
[im 36/36]
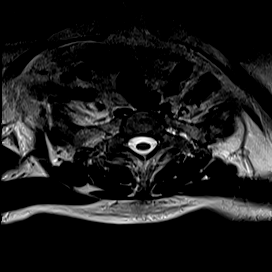

[Series 41: T1 · sagittal · 3.0mm · 1.05mm/px · 6 of 17 slices shown (2 of 2)]
[im 1/17]
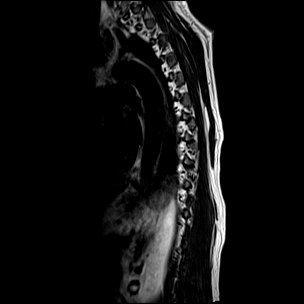
[im 4/17]
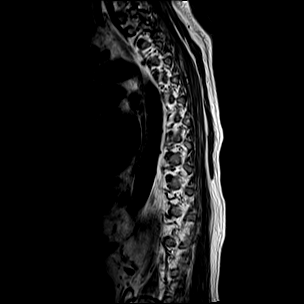
[im 7/17]
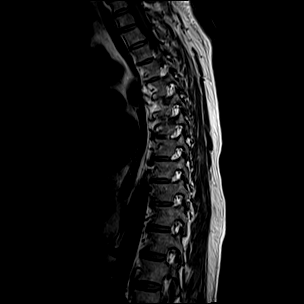
[im 10/17]
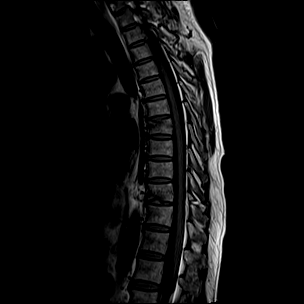
[im 13/17]
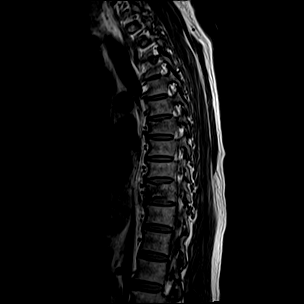
[im 17/17]
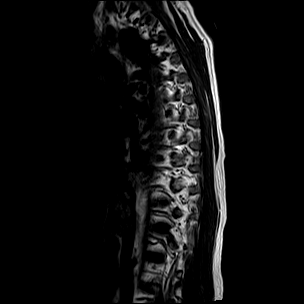

[26 of 48 positions shown; findings below may reference images not displayed]

FINDINGS: Alignment:  No significant listhesis is present.

Vertebrae: Acute superior endplate fracture is present T7 with 30%
loss of height anteriorly.

No acute fracture is present at T10 with 40% loss of height
inferiorly. No significant retropulsion of bone is present in either
level. Right laminar fracture present at T10. Paraspinous edema is
present at T10.

Cord:  Normal signal and morphology.

Paraspinal and other soft tissues: Paraspinous edema is present at
both fracture levels, T7 and T10. Small pleural effusions are
present, right greater than left. Paraspinous soft tissues are
otherwise within normal limits.

Disc levels:

No significant foraminal or central canal stenosis. No significant
disc disease.
IMPRESSION: 1. Acute superior endplate compression fracture at T7 with 30% loss
of height anteriorly.
2. Acute 40% loss of height inferiorly at T10.
3. Right laminar fracture at T10.
4. Paraspinal edema at both fracture levels.
5. Small pleural effusions, right greater than left.
6. No significant stenosis.

These results will be called to the ordering clinician or
representative by the Radiologist Assistant, and communication
documented in the PACS or [REDACTED].

## 2020-07-03 MED ORDER — MAGNESIUM SULFATE 4 GM/100ML IV SOLN
4.0000 g | Freq: Once | INTRAVENOUS | Status: AC
  Administered 2020-07-03: 4 g via INTRAVENOUS
  Filled 2020-07-03: qty 100

## 2020-07-03 MED ORDER — POTASSIUM CHLORIDE CRYS ER 20 MEQ PO TBCR
40.0000 meq | EXTENDED_RELEASE_TABLET | Freq: Once | ORAL | Status: AC
  Administered 2020-07-03: 40 meq via ORAL
  Filled 2020-07-03: qty 2

## 2020-07-03 MED ORDER — ACETAMINOPHEN 325 MG PO TABS
650.0000 mg | ORAL_TABLET | Freq: Four times a day (QID) | ORAL | Status: DC
  Administered 2020-07-03 – 2020-07-21 (×67): 650 mg via ORAL
  Filled 2020-07-03 (×68): qty 2

## 2020-07-03 MED ORDER — METHOCARBAMOL 500 MG PO TABS
1000.0000 mg | ORAL_TABLET | Freq: Three times a day (TID) | ORAL | Status: DC
  Administered 2020-07-03 – 2020-07-21 (×56): 1000 mg via ORAL
  Filled 2020-07-03 (×57): qty 2

## 2020-07-03 MED ORDER — SPIRITUS FRUMENTI
1.0000 | Freq: Three times a day (TID) | ORAL | Status: DC
  Administered 2020-07-03 – 2020-07-08 (×16): 1 via ORAL
  Filled 2020-07-03 (×20): qty 1

## 2020-07-03 MED ORDER — LEVETIRACETAM 500 MG PO TABS
500.0000 mg | ORAL_TABLET | Freq: Two times a day (BID) | ORAL | Status: AC
  Administered 2020-07-03 – 2020-07-09 (×14): 500 mg via ORAL
  Filled 2020-07-03 (×14): qty 1

## 2020-07-03 MED ORDER — CHLORHEXIDINE GLUCONATE CLOTH 2 % EX PADS
6.0000 | MEDICATED_PAD | Freq: Every day | CUTANEOUS | Status: DC
  Administered 2020-07-03 – 2020-07-19 (×12): 6 via TOPICAL

## 2020-07-03 NOTE — TOC CAGE-AID Note (Signed)
Transition of Care [facility]) - CAGE-AID Screening   Patient Details  Name: Samuel [name] MRN: [id number] Date of Birth: 06-Jan-1971  Transition of Care [facility]) CM/SW Contact:    Mearl Latin, LCSW Phone Number: 07/03/2020, 9:38 AM   Clinical Narrative: Patient disoriented and unable to participate in screening.    CAGE-AID Screening: Substance Abuse Screening unable to be completed due to: : Patient unable to participate

## 2020-07-03 NOTE — Progress Notes (Addendum)
Occupational Therapy Evaluation Patient Details Name: Samuel [name] MRN: [id number] DOB: Jul 06, 1970 Today's Date: 07/03/2020    History of Present Illness 50 y.o. male found down outside his house - fall vs syncope.. Pt with T10, T 7 spinal fxs, cerivcalgia, R first rib fx, R scapula and clavicle fxs (sling and NWB - pending work up), questionable syncope, TBI with SAH; ETOH abuse - on CIWA.   Clinical Impression   Increased time at beginning of session to correctly position all braces and sling. Mepalex/foam padding added underneath potential pressure areas of neck and back brace with assistance of nsg. [employer] interpreter Byrd Hesselbach), then in-house interpreter, Darrelyn Hillock, used during session. 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 Pt also does construction work. Pt is oriented to place but not to time or situation. Educated pt on back and UE precautions, however pt unable to recall precautions at this time. Required Mod A for bed mobility to adhere to precautions and min A to mobilize to [facility]. VSS throughout session with Max HR  @ 134; BP 143/88; SpO2 94 RA. Pain did not increase with mobility per pt. Anticipate DC home with friends/family and need to follow up with outpt therapy for RUE per surgeons plan of care. Will follow acutely to facilitate safe DC home.     Follow Up Recommendations  Supervision - Intermittent;Follow surgeon's recommendation for DC plan and follow-up therapies;Outpatient OT (May need follow up for RUE reahb when medically appropriate)    Equipment Recommendations  Tub/shower seat    Recommendations for Other Services       Precautions / Restrictions Precautions Precautions: Fall;Cervical;Back;Shoulder Type of Shoulder Precautions: sling and NWB at this time Shoulder Interventions: Shoulder sling/immobilizer Required Braces or Orthoses: Cervical Brace;Spinal Brace;Sling Restrictions Weight Bearing Restrictions: Yes RUE Weight  Bearing: Non weight bearing      Mobility Bed Mobility Overal bed mobility: Needs Assistance Bed Mobility: Sidelying to Sit;Sit to Sidelying;Rolling Rolling: Min guard Sidelying to sit: Mod assist;+2 for safety/equipment     Sit to sidelying: Mod assist;+2 for safety/equipment General bed mobility comments: Increased assistance as pt appears to have difficulty understanding back precautions and need to log rolling/sequencing of movement    Transfers Overall transfer level: Needs assistance Equipment used: 2 person hand held assist Transfers: Sit to/from [employer] Sit to Stand: Min assist;+2 safety/equipment   Squat pivot transfers: +2 safety/equipment;Min assist     General transfer comment: Able to take several steps to commode    Balance Overall balance assessment: Needs assistance   Sitting balance-Leahy Scale: Fair       Standing balance-Leahy Scale: Poor                             ADL either performed or assessed with clinical judgement   ADL Overall ADL's : Needs assistance/impaired     Grooming: Moderate assistance   Upper Body Bathing: Moderate assistance;Sitting   Lower Body Bathing: Moderate assistance;Sit to/from stand   Upper Body Dressing : Maximal assistance   Lower Body Dressing: Maximal assistance   Toilet Transfer: Minimal assistance;BSC;Stand-pivot;+2 for safety/equipment;Cueing for safety;Cueing for sequencing   Toileting- Clothing Manipulation and Hygiene: Total assistance (foley; pt able to verbalize need to urinate; feeling like he needed to have a BM)       Functional mobility during ADLs: Minimal assistance;+2 for safety/equipment;Cueing for sequencing;Cueing for safety General ADL Comments: Began educating on  back precautions and precautions for RUE. pt having difficulty with recall     Vision         Perception     Praxis      Pertinent Vitals/Pain Pain Assessment: 0-10 Pain Score: 8  Pain  Location: back, R arm Pain Descriptors / Indicators: Discomfort;Grimacing;Guarding Pain Intervention(s): Limited activity within patient's tolerance;Repositioned;Patient requesting pain meds-RN notified     Hand Dominance Right   Extremity/Trunk Assessment Upper Extremity Assessment Upper Extremity Assessment: RUE deficits/detail RUE Deficits / Details: HAND/elbow ROM WFL; shoulder not tested; pending surgery ?6/20? RUE Coordination: decreased gross motor   Lower Extremity Assessment Lower Extremity Assessment: Defer to PT evaluation (no complaints of numbness/tingling)       Communication Communication Communication: Prefers language other than English   Cognition Arousal/Alertness: Awake/alert Behavior During Therapy: WFL for tasks assessed/performed;Impulsive (at times; most likely related to language barrier) Overall Cognitive Status: Difficult to assess Area of Impairment: Orientation;Attention;Memory;Following commands;Safety/judgement;Awareness;Problem solving                 Orientation Level: Disoriented to;Time (thinks it is [place]; knows it is June and 2022, [place]; does not know name of hospital; states he is here becaue he has pain in his arm and back; states he did not fall) Current Attention Level: Selective Memory: Decreased recall of precautions;Decreased short-term memory Following Commands: Follows one step commands consistently Safety/Judgement: Decreased awareness of safety;Decreased awareness of deficits Awareness: Emergent   General Comments: Unable to recall precautions   General Comments       Exercises     Shoulder Instructions      Home Living Family/patient expects to be discharged to:: Private residence Living Arrangements: Non-relatives/Friends Available Help at Discharge: Friend(s);Available PRN/intermittently Type of Home: House Home Access: Stairs to enter Entrance Stairs-Number of Steps: 3   Home Layout: Two level;Other  (Comment) (unsure; pt "not allowed" upstairs) Alternate Level Stairs-Number of Steps: flight   Bathroom Shower/Tub: Chief Strategy Officer: Standard                Prior Functioning/Environment Level of Independence: Independent        Comments: lives with a fmaily who offered him work @ 1 yr ago; does work around [employer] and works Holiday representative occasionally        OT Problem List: Decreased strength;Decreased range of motion;Decreased activity tolerance;Impaired balance (sitting and/or standing);Decreased coordination;Decreased cognition;Decreased safety awareness;Decreased knowledge of use of DME or AE;Decreased knowledge of precautions;Cardiopulmonary status limiting activity;Impaired UE functional use;Pain      OT Treatment/Interventions: Self-care/ADL training;Therapeutic exercise;Neuromuscular education;Energy conservation;DME and/or AE instruction;Therapeutic activities;Cognitive remediation/compensation;Patient/family education;Balance training    OT Goals(Current goals can be found in the care plan section) Acute Rehab OT Goals Patient Stated Goal: to go home OT Goal Formulation: With patient Potential to Achieve Goals: Good  OT Frequency: Min 2X/week   Barriers to D/C:            Co-evaluation PT/OT/SLP Co-Evaluation/Treatment: Yes Reason for Co-Treatment: Complexity of the patient's impairments (multi-system involvement);Necessary to address cognition/behavior during functional activity;For patient/therapist safety;To address functional/ADL transfers   OT goals addressed during session: ADL's and self-care      AM-PAC OT "6 Clicks" Daily Activity     Outcome Measure Help from another person eating meals?: A Lot Help from another person taking care of personal grooming?: A Lot Help from another person toileting, which includes using toliet, bedpan, or urinal?: A Lot Help from another person bathing (including washing, rinsing, drying)?:  A  Lot Help from another person to put on and taking off regular upper body clothing?: A Lot Help from another person to put on and taking off regular lower body clothing?: A Lot 6 Click Score: 12   End of Session Equipment Utilized During Treatment: Cervical collar;Back brace;Other (comment) (sling) Nurse Communication: Mobility status;Precautions;Weight bearing status  Activity Tolerance: Patient tolerated treatment well Patient left: in bed;with call bell/phone within reach;with bed alarm set;with SCD's reapplied;with nursing/sitter in room  OT Visit Diagnosis: Unsteadiness on feet (R26.81);Other abnormalities of gait and mobility (R26.89);Muscle weakness (generalized) (M62.81);History of falling (Z91.81);Other symptoms and signs involving cognitive function;Dizziness and giddiness (R42);Pain Pain - Right/Left: Right Pain - part of body: Arm;Shoulder (back)                Time: 6010-9323 OT Time Calculation (min): 48 min Charges:  OT General Charges $OT Visit: 1 Visit OT Evaluation $OT Eval Moderate Complexity: 1 Mod OT Treatments $Self Care/Home Management : 8-22 mins  Luisa Dago, OT/L   Acute OT Clinical Specialist Acute Rehabilitation Services Pager [phone] Office [phone]   [facility] 07/03/2020, 12:45 PM

## 2020-07-03 NOTE — Progress Notes (Signed)
OT Cancellation Note  Patient Details Name: Gorje Iyer MRN: [id number] DOB: December 12, 1970   Cancelled Treatment:    Reason Eval/Treat Not Completed: Other (comment) Pt having EEG; Pt has active lay flat/log roll orders. Will assess once activity orders updated and EEG complete as appropriate.   Thornell Mule, OT/L   Acute OT Clinical Specialist Acute Rehabilitation Services Pager [phone] Office [phone]  07/03/2020, 9:34 AM

## 2020-07-03 NOTE — Plan of Care (Signed)

## 2020-07-03 NOTE — Progress Notes (Signed)
Patient admitted on 4N from ED- belongings include a shirt, pants, underwear, shoes, and a wallet containing an ID not matching the patient.

## 2020-07-03 NOTE — Progress Notes (Signed)
Trauma/Critical Care Follow Up Note  Subjective:    Overnight Issues:   Objective:  Vital signs for last 24 hours: Temp:  [98.7 F (37.1 C)-99.2 F (37.3 C)] 99.2 F (37.3 C) (06/17 0400) Pulse Rate:  [92-107] 95 (06/17 0700) Resp:  [16-31] 20 (06/17 0500) BP: (113-158)/(78-102) 135/98 (06/17 0700) SpO2:  [91 %-99 %] 91 % (06/17 0500) Weight:  [49.9 kg-60.2 kg] 60.2 kg (06/17 0200)  Hemodynamic parameters for last 24 hours:    Intake/Output from previous day: 06/16 0701 - 06/17 0700 In: 1757.6 [P.O.:120; I.V.:122.4; IV Piggyback:1515.2] Out: -   Intake/Output this shift: No intake/output data recorded.  Vent settings for last 24 hours:    Physical Exam:  Gen: comfortable, no distress Neuro: non-focal exam, f/c HEENT: PERRL Neck: c-collar, +ML TTP CV: RRR Pulm: unlabored breathing Abd: soft, NT, TLSO  GU: clear yellow urine, spont voids Extr: wwp, no edema, sling RUE   Results for orders placed or performed during the hospital encounter of 07/02/20 (from the past 24 hour(s))  Comprehensive metabolic panel     Status: Abnormal   Collection Time: 07/02/20  5:18 PM  Result Value Ref Range   Sodium 136 135 - 145 mmol/L   Potassium 3.0 (L) 3.5 - 5.1 mmol/L   Chloride 101 98 - 111 mmol/L   CO2 24 22 - 32 mmol/L   Glucose, Bld 117 (H) 70 - 99 mg/dL   BUN <5 (L) 6 - 20 mg/dL   Creatinine, Ser 1.19 0.61 - 1.24 mg/dL   Calcium 8.0 (L) 8.9 - 10.3 mg/dL   Total Protein 6.5 6.5 - 8.1 g/dL   Albumin 3.6 3.5 - 5.0 g/dL   AST 147 (H) 15 - 41 U/L   ALT 91 (H) 0 - 44 U/L   Alkaline Phosphatase 85 38 - 126 U/L   Total Bilirubin 0.8 0.3 - 1.2 mg/dL   GFR, Estimated >82 >95 mL/min   Anion gap 11 5 - 15  CBC with Differential     Status: Abnormal   Collection Time: 07/02/20  5:18 PM  Result Value Ref Range   WBC 7.7 4.0 - 10.5 K/uL   RBC 3.26 (L) 4.22 - 5.81 MIL/uL   Hemoglobin 10.6 (L) 13.0 - 17.0 g/dL   HCT 62.1 (L) 30.8 - 65.7 %   MCV 96.0 80.0 - 100.0 fL   MCH  32.5 26.0 - 34.0 pg   MCHC 33.9 30.0 - 36.0 g/dL   RDW 84.6 96.2 - 95.2 %   Platelets 155 150 - 400 K/uL   nRBC 0.0 0.0 - 0.2 %   Neutrophils Relative % 83 %   Neutro Abs 6.3 1.7 - 7.7 K/uL   Lymphocytes Relative 5 %   Lymphs Abs 0.4 (L) 0.7 - 4.0 K/uL   Monocytes Relative 10 %   Monocytes Absolute 0.8 0.1 - 1.0 K/uL   Eosinophils Relative 0 %   Eosinophils Absolute 0.0 0.0 - 0.5 K/uL   Basophils Relative 1 %   Basophils Absolute 0.1 0.0 - 0.1 K/uL   Immature Granulocytes 1 %   Abs Immature Granulocytes 0.10 (H) 0.00 - 0.07 K/uL  Ethanol     Status: Abnormal   Collection Time: 07/02/20  5:18 PM  Result Value Ref Range   Alcohol, Ethyl (B) 244 (H) <10 mg/dL  Urine rapid drug screen (hosp performed)     Status: None   Collection Time: 07/02/20  6:01 PM  Result Value Ref Range   Opiates NONE  DETECTED NONE DETECTED   Cocaine NONE DETECTED NONE DETECTED   Benzodiazepines NONE DETECTED NONE DETECTED   Amphetamines NONE DETECTED NONE DETECTED   Tetrahydrocannabinol NONE DETECTED NONE DETECTED   Barbiturates NONE DETECTED NONE DETECTED  Troponin I (High Sensitivity)     Status: None   Collection Time: 07/02/20  6:29 PM  Result Value Ref Range   Troponin I (High Sensitivity) 9 <18 ng/L  Troponin I (High Sensitivity)     Status: None   Collection Time: 07/02/20  7:34 PM  Result Value Ref Range   Troponin I (High Sensitivity) 7 <18 ng/L  Protime-INR     Status: Abnormal   Collection Time: 07/02/20  8:14 PM  Result Value Ref Range   Prothrombin Time 15.3 (H) 11.4 - 15.2 seconds   INR 1.2 0.8 - 1.2  Resp Panel by RT-PCR (Flu A&B, Covid) Nasopharyngeal Swab     Status: None   Collection Time: 07/02/20 11:13 PM   Specimen: Nasopharyngeal Swab; Nasopharyngeal(NP) swabs in vial transport medium  Result Value Ref Range   SARS Coronavirus 2 by RT PCR NEGATIVE NEGATIVE   Influenza A by PCR NEGATIVE NEGATIVE   Influenza B by PCR NEGATIVE NEGATIVE  MRSA Next Gen by PCR, Nasal     Status:  None   Collection Time: 07/03/20  1:20 AM  Result Value Ref Range   MRSA by PCR Next Gen NOT DETECTED NOT DETECTED  CBC     Status: Abnormal   Collection Time: 07/03/20  4:40 AM  Result Value Ref Range   WBC 6.7 4.0 - 10.5 K/uL   RBC 3.04 (L) 4.22 - 5.81 MIL/uL   Hemoglobin 9.9 (L) 13.0 - 17.0 g/dL   HCT 62.7 (L) 03.5 - 00.9 %   MCV 94.7 80.0 - 100.0 fL   MCH 32.6 26.0 - 34.0 pg   MCHC 34.4 30.0 - 36.0 g/dL   RDW 38.1 82.9 - 93.7 %   Platelets 151 150 - 400 K/uL   nRBC 0.0 0.0 - 0.2 %  Comprehensive metabolic panel     Status: Abnormal   Collection Time: 07/03/20  4:40 AM  Result Value Ref Range   Sodium 134 (L) 135 - 145 mmol/L   Potassium 3.4 (L) 3.5 - 5.1 mmol/L   Chloride 96 (L) 98 - 111 mmol/L   CO2 25 22 - 32 mmol/L   Glucose, Bld 132 (H) 70 - 99 mg/dL   BUN <5 (L) 6 - 20 mg/dL   Creatinine, Ser 1.69 (L) 0.61 - 1.24 mg/dL   Calcium 8.0 (L) 8.9 - 10.3 mg/dL   Total Protein 6.9 6.5 - 8.1 g/dL   Albumin 3.7 3.5 - 5.0 g/dL   AST 678 (H) 15 - 41 U/L   ALT 92 (H) 0 - 44 U/L   Alkaline Phosphatase 77 38 - 126 U/L   Total Bilirubin 0.9 0.3 - 1.2 mg/dL   GFR, Estimated >93 >81 mL/min   Anion gap 13 5 - 15  Magnesium     Status: Abnormal   Collection Time: 07/03/20  4:40 AM  Result Value Ref Range   Magnesium 1.2 (L) 1.7 - 2.4 mg/dL  Phosphorus     Status: None   Collection Time: 07/03/20  4:40 AM  Result Value Ref Range   Phosphorus 3.0 2.5 - 4.6 mg/dL    Assessment & Plan: The plan of care was discussed with the bedside nurse for the day, Tresa Endo, who is in agreement with this plan and  no additional concerns were raised.   Present on Admission:  Fall    LOS: 1 day   Additional comments:I reviewed the patient's new clinical lab test results.   and I reviewed the patients new imaging test results.    Fall  SAH - NSGY c/s, Dr. 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 Conchita Paris, TLSO brace  for now, assess mobility/pain control in brace, MRI T-spine today T7 endplate fracture - NSGY c/s, Dr. Conchita Paris, pain control, already in TLSO brace Cervicalgia - TTP on exam this AM, cont c-collar, MRI c-spine today Right first rib fx - EKG, pain control, pulm toilet EtOH abuse - TOC c/s, CIWA, beer with meals, thiamine/folate Right scapular fracture, R clavicle fx - ortho c/s, Dr. Magnus Ivan, sling for now, final recs pending Elevated LFTs - monitor Questionable syncope - echo, carotid duplex, EEG today FEN - adv to reg diet, replete hypokalemia and hypophosphatemia DVT - SCDs, hold LMWH until stable head CT and okay with NSGY given spine fx Dispo - ICU   Critical Care Total Time: 60 minutes  Diamantina Monks, MD Trauma & General Surgery Please use [email or web address] to contact on call provider  07/03/2020  *Care during the described time interval was provided by me. I have reviewed this patient's available data, including medical history, events of note, physical examination and test results as part of my evaluation.

## 2020-07-03 NOTE — Progress Notes (Signed)
Attempted carotid duplex. Unable to complete exam due to the neck brace.  Will re-attempt when neck brace is removed.    Blanch Media 07/03/2020 12:50 PM

## 2020-07-03 NOTE — Progress Notes (Signed)
EEG Completed; Results Pending  

## 2020-07-03 NOTE — Evaluation (Addendum)
Speech Language Pathology Evaluation Patient Details Name: Samuel [name] MRN: [id number] DOB: 08/25/1970 Today's Date: 07/03/2020 Time: 1240-1300 SLP Time Calculation (min) (ACUTE ONLY): 20 min  Problem List:  Patient Active Problem List   Diagnosis Date Noted   Fall 07/02/2020   Past Medical History:  Past Medical History:  Diagnosis Date   ETOH abuse    Past Surgical History: History reviewed. No pertinent surgical history. HPI:  50 y.o. male found down outside his house - fall vs syncope.. Pt with T10, T 7 spinal fxs, cerivcalgia, R first rib fx, R scapula and clavicle fxs (sling and NWB - pending work up), questionable syncope, TBI with SAH; ETOH abuse - on CIWA.  Assessment / Plan / Recommendation Clinical Impression  Pt demonstrates appropriate social language, but impaired memory and awareness impacting safety with basic functional tasks. Pt is able to follow commands and sustain attention for several minutes without restless behavior. He is polite and calm and will follow commands as needed. However, pt cannot repeat new information after it is provided or after a delay with multiple repetitions. He cannot comprehend his injuries and safety precautions associated with them. His education level is minimal and he likely needs some level of assistance at baseline to manage more complex functional tasks given his self reported alcoholism. He is a risk for reinjury. He would benefit from further cognitive itnerventions to improve safety awareness.    SLP Assessment  SLP Recommendation/Assessment: Patient needs continued Speech Lanaguage Pathology Services SLP Visit Diagnosis: Cognitive communication deficit (R41.841)    Follow Up Recommendations  Inpatient Rehab    Frequency and Duration min 2x/week  2 weeks      SLP Evaluation Cognition  Overall Cognitive Status: Impaired/Different from baseline (Simultaneous filing. User may not have seen previous  data.) Arousal/Alertness: Awake/alert Orientation Level: Oriented to person;Oriented to place;Oriented to time;Disoriented to situation Attention: Focused;Sustained Focused Attention: Appears intact Sustained Attention: Appears intact Memory: Impaired Memory Impairment: Decreased recall of new information Awareness: Impaired Awareness Impairment: Intellectual impairment;Emergent impairment;Anticipatory impairment Problem Solving: Impaired Problem Solving Impairment: Verbal complex;Functional complex Executive Function: Decision Making;Self Monitoring Decision Making: Impaired Decision Making Impairment: Functional complex;Verbal complex Self Monitoring: Impaired Self Monitoring Impairment: Verbal complex;Functional complex Safety/Judgment: Impaired       Comprehension  Auditory Comprehension Overall Auditory Comprehension: Appears within functional limits for tasks assessed    Expression Verbal Expression Overall Verbal Expression: Appears within functional limits for tasks assessed Written Expression Dominant Hand: Right   Oral / Motor  Oral Motor/Sensory Function Overall Oral Motor/Sensory Function: Within functional limits Motor Speech Overall Motor Speech: Appears within functional limits for tasks assessed   GO                    Samuel [name], Samuel [name] 07/03/2020, 2:15 PM

## 2020-07-03 NOTE — Evaluation (Signed)
Physical Therapy Evaluation Patient Details Name: Samuel [name] MRN: [id number] DOB: [birth year] Today's Date: 07/03/2020   History of Present Illness  50 y.o. male found down outside his house - fall vs syncope.. Pt with T10, T 7 spinal fxs, cerivcalgia, R first rib fx, R scapula and clavicle fxs (sling and NWB - pending work up), questionable syncope, TBI with SAH; ETOH abuse - on CIWA.   Clinical Impression  Pt in bed upon arrival of PT, agreeable to evaluation at this time. 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 The pt now presents with limitations in functional mobility, safety awareness, memory, stability, and activity tolerance due to above dx, and will continue to benefit from skilled PT to address these deficits. The pt was able to demo god physical strength, but is most limited by impaired cognition that limits him from remembering any spinal or wt bearing precautions, needing repeated cues for safety, problem solving, and mobility. The pt will require continued skilled PT acutely as well as supervision following d/c as he is at high risk for falls and/or re-injury due to lack of understanding of injuries/precautions. The pt was able to demo good initial transfers and steps with minA of 1 without overt LOB at this time, will continue to progress with mobility and balance challenge prior to eventual d/c when medically stable.      Follow Up Recommendations Outpatient PT    Equipment Recommendations  None recommended by PT    Recommendations for Other Services       Precautions / Restrictions Precautions Precautions: Fall;Cervical;Back;Shoulder Type of Shoulder Precautions: sling and NWB at this time Shoulder Interventions: Shoulder sling/immobilizer Precaution Booklet Issued: No Precaution Comments: verbally reveiwed, pt unable to recal despite max cues at any time through  session Required Braces or Orthoses: Cervical Brace;Spinal Brace;Sling Restrictions Weight Bearing Restrictions: Yes RUE Weight Bearing: Non weight bearing      Mobility  Bed Mobility Overal bed mobility: Needs Assistance Bed Mobility: Rolling;Sidelying to Sit;Sit to Sidelying Rolling: Min guard Sidelying to sit: Mod assist;+2 for safety/equipment     Sit to sidelying: Mod assist;+2 for safety/equipment General bed mobility comments: Increased assistance as pt appears to have difficulty understanding back precautions and need to log rolling/sequencing of movement    Transfers Overall transfer level: Needs assistance Equipment used: 2 person hand held assist Transfers: Sit to/from [employer] Transfers Sit to Stand: Min assist;+2 safety/equipment   Squat pivot transfers: +2 safety/equipment;Min assist     General transfer comment: minA of 2 for safety and line management, able to take several steps to [facility]  Ambulation/Gait Ambulation/Gait assistance: Min assist;+2 safety/equipment Gait Distance (Feet): 4 Feet Assistive device: 1 person hand held assist Gait Pattern/deviations: Step-to pattern;Decreased stride length     General Gait Details: pt with small lateral steps to BSC, no overt LOB, pt able to steady with single UE support      Balance Overall balance assessment: Needs assistance Sitting-balance support: Single extremity supported;Feet supported Sitting balance-Leahy Scale: Fair     Standing balance support: Single extremity supported;During functional activity Standing balance-Leahy Scale: Poor                               Pertinent Vitals/Pain Pain Assessment: 0-10 Pain Score: 8  Pain Location: back, R arm Pain Descriptors / Indicators: Discomfort;Grimacing;Guarding Pain Intervention(s):  Limited activity within patient's tolerance;Monitored during session;Repositioned    Home Living Family/patient expects to be discharged to::  Private residence Living Arrangements: Non-relatives/Friends Available Help at Discharge: Friend(s);Available PRN/intermittently Type of Home: House Home Access: Stairs to enter Entrance Stairs-Rails: None Entrance Stairs-Number of Steps: 3 Home Layout: Two level;Other (Comment) (unsure, pt reports "blocked")        Prior Function Level of Independence: Independent         Comments: lives with a fmaily who offered him work @ 1 yr ago; does work around [employer] and works Holiday representative occasionally     Higher education careers adviser   Dominant Hand: Right    Extremity/Trunk Assessment   Upper Extremity Assessment Upper Extremity Assessment: Defer to OT evaluation RUE Deficits / Details: HAND/elbow ROM WFL; shoulder not tested; pending surgery ?6/20? RUE Coordination: decreased gross motor    Lower Extremity Assessment Lower Extremity Assessment: Overall WFL for tasks assessed (pt denies difference in sensation to BLE, good functional strength bilaterally)    Cervical / Trunk Assessment Cervical / Trunk Assessment: Other exceptions Cervical / Trunk Exceptions: in TLSO, cervical collar  Communication   Communication: Prefers language other than [place];Interpreter utilized (in-person interpreter used)  Cognition Arousal/Alertness: Awake/alert Behavior During Therapy: WFL for tasks assessed/performed;Impulsive (at times; most likely related to language barrier) Overall Cognitive Status: Difficult to assess Area of Impairment: Orientation;Attention;Memory;Following commands;Safety/judgement;Awareness;Problem solving                 Orientation Level: Disoriented to;Time (thinks it is [place]; knows it is June and 2022, [place]; does not know name of hospital; states he is here becaue he has pain in his arm and back; states he did not fall) Current Attention Level: Selective Memory: Decreased recall of precautions;Decreased short-term memory Following Commands: Follows one step  commands consistently Safety/Judgement: Decreased awareness of safety;Decreased awareness of deficits Awareness: Emergent   General Comments: Unable to recall precautions             Assessment/Plan    PT Assessment Patient needs continued PT services  PT Problem List Decreased strength;Decreased range of motion;Decreased activity tolerance;Decreased balance;Decreased mobility;Decreased coordination;Decreased cognition;Decreased safety awareness;Pain       PT Treatment Interventions DME instruction;Gait training;Stair training;Functional mobility training;Therapeutic activities;Therapeutic exercise;Balance training;Patient/family education    PT Goals (Current goals can be found in the Care Plan section)  Acute Rehab PT Goals Patient Stated Goal: to go home PT Goal Formulation: With patient Time For Goal Achievement: 07/18/20 Potential to Achieve Goals: Good    Frequency Min 4X/week   Barriers to discharge Decreased caregiver support unsure of pt support    Co-evaluation PT/OT/SLP Co-Evaluation/Treatment: Yes Reason for Co-Treatment: Complexity of the patient's impairments (multi-system involvement);Necessary to address cognition/behavior during functional activity;For patient/therapist safety;To address functional/ADL transfers PT goals addressed during session: Mobility/safety with mobility;Balance;Strengthening/ROM OT goals addressed during session: ADL's and self-care       AM-PAC PT "6 Clicks" Mobility  Outcome Measure Help needed turning from your back to your side while in a flat bed without using bedrails?: A Little Help needed moving from lying on your back to sitting on the side of a flat bed without using bedrails?: A Little Help needed moving to and from a bed to a chair (including a wheelchair)?: A Little Help needed standing up from a chair using your arms (e.g., wheelchair or bedside chair)?: A Little Help needed to walk in hospital room?: A Little Help  needed climbing 3-5 steps with a railing? : A Lot 6  Click Score: 17    End of Session Equipment Utilized During Treatment: Gait belt;Back brace;Cervical collar Activity Tolerance: Patient tolerated treatment well;No increased pain Patient left: in bed;with call bell/phone within reach;with bed alarm set;with nursing/sitter in room Nurse Communication: Mobility status PT Visit Diagnosis: Other abnormalities of gait and mobility (R26.89);Pain Pain - Right/Left: Right Pain - part of body: Shoulder;Arm    Time: 4010-2725 PT Time Calculation (min) (ACUTE ONLY): 38 min   Charges:   PT Evaluation $PT Eval Moderate Complexity: 1 Mod          [name], PT, DPT   Acute Rehabilitation Department Pager #: [phone]  [name] 07/03/2020, 2:13 PM

## 2020-07-03 NOTE — Progress Notes (Signed)
SLP Cancellation Note  Patient Details Name: Jancarlo Biermann MRN: [id number] DOB: [birth year]   Cancelled treatment:       Reason Eval/Treat Not Completed: Patient at procedure or test/unavailable. EEG   Kazaria Gaertner, [name] 07/03/2020, 10:01 AM

## 2020-07-03 NOTE — ED Notes (Signed)
Tried calling report no answer 

## 2020-07-03 NOTE — Procedures (Signed)
Patient Name: Samuel [name]  MRN: [id number]  Epilepsy Attending: Charlsie Quest  Referring Physician/Provider: Dr Kris Mouton Date: 07/03/2020 Duration: 23.10 mins  Patient history: 50 year old male with subarachnoid hemorrhage and syncope.  EEG to evaluate for seizures.  Level of alertness: Awake  AEDs during EEG study: Keppra  Technical aspects: This EEG study was done with scalp electrodes positioned according to the 10-20 International system of electrode placement. Electrical activity was acquired at a sampling rate of 500Hz  and reviewed with a high frequency filter of 70Hz  and a low frequency filter of 1Hz . EEG data were recorded continuously and digitally stored.   Description: The posterior dominant rhythm consists of 9 Hz activity of moderate voltage (25-35 uV) seen predominantly in posterior head regions, symmetric and reactive to eye opening and eye closing. Hyperventilation and photic stimulation were not performed.     IMPRESSION: This study is within normal limits. No seizures or epileptiform discharges were seen throughout the recording.  Lenya Sterne 

## 2020-07-03 NOTE — Plan of Care (Signed)
°  Problem: Clinical Measurements: °Goal: Ability to maintain clinical measurements within normal limits will improve °Outcome: Progressing °Goal: Will remain free from infection °Outcome: Progressing °Goal: Diagnostic test results will improve °Outcome: Progressing °Goal: Respiratory complications will improve °Outcome: Progressing °Goal: Cardiovascular complication will be avoided °Outcome: Progressing °  °Problem: Coping: °Goal: Level of anxiety will decrease °Outcome: Progressing °  °Problem: Elimination: °Goal: Will not experience complications related to bowel motility °Outcome: Progressing °Goal: Will not experience complications related to urinary retention °Outcome: Progressing °  °Problem: Pain Managment: °Goal: General experience of comfort will improve °Outcome: Progressing °  °

## 2020-07-03 NOTE — Progress Notes (Signed)
  Echocardiogram 2D Echocardiogram has been performed.  Delcie Roch 07/03/2020, 2:57 PM

## 2020-07-03 NOTE — Consult Note (Signed)
  Chief Complaint   Chief Complaint  Patient presents with   Fall   Alcohol Intoxication    History of Present Illness  Samuel [name] is a 50 y.o. male presenting to the emergency department via EMS after family found him at home on the ground presumably after a fall.  Upon admission he was noted to be intoxicated and unable to provide accurate history.  Currently, the patient complains of pain primarily in his right arm and in his back.  He does not appear to complain of any weakness in the upper or lower extremities, nor does he complain of any loss of sensation.  Past Medical History   Past Medical History:  Diagnosis Date   ETOH abuse     Past Surgical History  History reviewed. No pertinent surgical history.  Social History   Social History   Tobacco Use   Smoking status: Unknown  Substance Use Topics   Alcohol use: Yes    Alcohol/week: 72.0 [name] drinks    Types: 72 Cans of beer per week    Medications   Prior to Admission medications   Medication Sig Start Date End Date Taking? Authorizing Provider  thiamine (VITAMIN B-1) 100 MG tablet Take 100 mg by mouth daily.   Yes [provider]    Allergies  No Known Allergies  Review of Systems  ROS  Neurologic Exam  Awake, alert, oriented Memory and concentration grossly intact Speech fluent, appropriate CN grossly intact Motor exam: Upper Extremities Deltoid Bicep Tricep Grip  Right In a sling     Left 5/5 5/5 5/5 5/5   Lower Extremities IP Quad PF DF EHL  Right 5/5 5/5 5/5 5/5 5/5  Left 5/5 5/5 5/5 5/5 5/5   Sensation grossly intact to LT  Imaging  CT scan of the head was personally reviewed and demonstrates a small amount of blood within the right sylvian fissure.  There is no associated mass-effect.  No hydrocephalus.  CT scan of the cervical spine was also personally reviewed and does not demonstrate any acute fractures or subluxations.  CT scan of the thoracic  spine was reviewed.  This demonstrates a wedge compression type fracture at T7 with minimal loss of height.  There is a fracture, comminuted in nature of the T10 vertebral body.  There is an associated fracture in the axial plane through the posterior elements of T10 including the lamina and inferior portion of the facet complex bilaterally.  Of note, the patient does not have any evidence of flowing osteophytes to suggest spondyloarthropathy or DISH.  Impression  - 50 y.o. male status post fall with mild ICH.  He has a comminuted fracture of T10 with preservation of normal thoracic alignment and no displacement of the fracture.  I therefore think this can be treated conservatively in a clamshell TLSO brace  Plan  - Will await MRI of the cervical and thoracic spine however plan will be to attempt to treat his T10 fracture conservatively with a brace to be worn when upright or out of bed. -Can complete 7 days of routine posttraumatic seizure prophylaxis with Keppra   Lisbeth Renshaw, MD [facility]

## 2020-07-03 NOTE — Consult Note (Signed)
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 hearing loss and tinnitus.   Eyes:  Negative for photophobia and pain.  Respiratory:  Negative for cough and shortness of breath.   Cardiovascular:  Positive for chest pain.  Gastrointestinal:  Positive for abdominal pain. Negative for nausea and vomiting.  Genitourinary:  Negative for dysuria, flank pain, frequency and urgency.  Musculoskeletal:  Positive for arthralgias (Right shoulder) and back pain (Extending into LLE).  Negative for myalgias and neck pain.  Neurological:  Negative for dizziness and headaches.  Hematological:  Does not bruise/bleed easily.  Psychiatric/Behavioral:  The patient is not nervous/anxious.   Blood pressure (!) 150/93, pulse 89, temperature 98.8 F (37.1 C),  temperature source Oral, resp. rate (!) 22, height 5' (1.524 m), weight 60.2 kg, SpO2 90 %. Physical Exam Constitutional:      General: He is not in acute distress.    Appearance: He is well-developed. He is not diaphoretic.  HENT:     Head: Normocephalic and atraumatic.  Eyes:     General: No scleral icterus.       Right eye: No discharge.        Left eye: No discharge.     Conjunctiva/sclera: Conjunctivae normal.  Neck:     Comments: C-collar Cardiovascular:     Rate and Rhythm: Normal rate and regular rhythm.  Pulmonary:     Effort: Pulmonary effort is normal. No respiratory distress.  Musculoskeletal:     Comments: Right  shoulder, elbow, wrist, digits- Posterior shoulder abrasion, mod diffuse TTP, no instability, no blocks to motion  Sens  Ax/R/M/U intact  Mot   Ax/ R/ PIN/ M/ AIN/ U intact  Rad 2+  Skin:    General: Skin is warm and dry.  Neurological:     Mental Status: He is alert.  Psychiatric:        Mood and Affect: Mood normal.        Behavior: Behavior normal.    Assessment/Plan: Right clavicle fx -- Would probably benefit from ORIF given concomitant scapula and first rib fxs and dominant hand though it's in good position at present. Will reevaluate on Monday and decide on best course. Would not delay discharge as this can be done as outpatient.  Right scapula fx -- Does impact glenoid but essentially ND. Continue sling and NWB. Other injuries including rib fx, TBI w/ICH, T10 fx, and T7 fx -- per trauma/NS EtOH abuse    Freeman Caldron, PA-C Orthopedic Surgery [phone] 07/03/2020, 9:04 AM

## 2020-07-03 NOTE — TOC CAGE-AID Note (Signed)
Transition of Care [facility]) - CAGE-AID Screening   Patient Details  Name: Samuel [name] MRN: [id number] Date of Birth: 24-Sep-1970   Hewitt Shorts, RN Trauma Response Nurse Phone Number: [phone] 07/03/2020, 9:52 AM   Clinical Narrative:  Pt is able to answer questions appropriately when using an interpretor for Spanish. Is having an EEG at present time.    CAGE-AID Screening: Substance Abuse Screening unable to be completed due to: : Patient unable to participate  Have You Ever Felt You Ought to Cut Down on Your Drinking or Drug Use?: No Have People Annoyed You By Critizing Your Drinking Or Drug Use?: No Have You Felt Bad Or Guilty About Your Drinking Or Drug Use?: No Have You Ever Had a Drink or Used Drugs First Thing In The Morning to Steady Your Nerves or to Get Rid of a Hangover?: No CAGE-AID Score: 0  Substance Abuse Education Offered: Yes (pt admits to drinking at least 6 beers a day-answered questions with assistance of interpretor, Consuella Lose, RN- unsure if he would like information on treatment options.)

## 2020-07-04 ENCOUNTER — Inpatient Hospital Stay (HOSPITAL_COMMUNITY): Payer: Self-pay

## 2020-07-04 ENCOUNTER — Encounter (HOSPITAL_COMMUNITY): Payer: Self-pay

## 2020-07-04 LAB — BASIC METABOLIC PANEL
Anion gap: 8 (ref 5–15)
BUN: <5 mg/dL — ABNORMAL LOW (ref 6–20)
CO2: 24 mmol/L (ref 22–32)
Calcium: 7.9 mg/dL — ABNORMAL LOW (ref 8.9–10.3)
Chloride: 99 mmol/L (ref 98–111)
Creatinine, Ser: 0.54 mg/dL — ABNORMAL LOW (ref 0.61–1.24)
GFR, Estimated: >60 mL/min (ref >60)
Glucose, Bld: 102 mg/dL — ABNORMAL HIGH (ref 70–99)
Potassium: 3.9 mmol/L (ref 3.5–5.1)
Sodium: 131 mmol/L — ABNORMAL LOW (ref 135–145)

## 2020-07-04 LAB — PHOSPHORUS: Phosphorus: 1.6 mg/dL — ABNORMAL LOW (ref 2.5–4.6)

## 2020-07-04 LAB — MAGNESIUM: Magnesium: 1.8 mg/dL (ref 1.7–2.4)

## 2020-07-04 MED ORDER — SODIUM PHOSPHATES 45 MMOLE/15ML IV SOLN
30.0000 mmol | Freq: Once | INTRAVENOUS | Status: AC
  Administered 2020-07-04: 30 mmol via INTRAVENOUS
  Filled 2020-07-04: qty 10

## 2020-07-04 NOTE — Progress Notes (Signed)
VASCULAR LAB    Attempted Carotid duplex.  Patient with T10 fracture and in brace. Tresa Endo, RN, states patient will not remain still enough to safely remove brace.  Will re-attempt once patient can remain still enough to remove brace.    Lafaye Mcelmurry, RVT 07/04/2020, 3:32 PM

## 2020-07-04 NOTE — Progress Notes (Signed)
Dr. Freida Busman updated. Patient is restless and agitated. Patient on CIWA. Patient high fall risk and attempting to get out of bed without RN, and not following safety instructions. Multiple administration of ativan given, both oral and IV. New orders received for soft waist belt restraint. Will continue to monitor.

## 2020-07-04 NOTE — Progress Notes (Signed)
Physical Therapy Treatment Patient Details Name: Samuel [name] MRN: [id number] DOB: [birth year] Today's Date: 07/04/2020    History of Present Illness 50 y.o. male found down outside his house - fall vs syncope.. Pt with T10, T 7 spinal fxs, cerivcalgia, R first rib fx, R scapula and clavicle fxs (sling and NWB - pending work up), questionable syncope, TBI with SAH; ETOH abuse - on CIWA.    PT Comments    The pt was agreeable to session with focus on progression of OOB mobility, but presents with increased lethargy from last session, and was unable to maintain alertness/attention to mobility tasks at this time. The pt continues with disorientation to time, place, and situation, unable to recall any precautions despite repeated cues through session. The pt was able to complete bed mobility and sit-stand tranfers with modA at this time, increased assist due to deficits in stability and alertness at this time. Max cues for direction and sequencing. The pt will continue to benefit from skilled PT to further progress functional stability, OOB tolerance, and safety awareness.     Follow Up Recommendations  Outpatient PT     Equipment Recommendations  None recommended by PT    Recommendations for Other Services       Precautions / Restrictions Precautions Precautions: Fall;Cervical;Back;Shoulder Type of Shoulder Precautions: sling and NWB at this time Shoulder Interventions: Shoulder sling/immobilizer Precaution Booklet Issued: No Precaution Comments: verbally reveiwed, pt unable to recal despite max cues at any time through session Required Braces or Orthoses: Cervical Brace;Spinal Brace;Sling Splint/Cast - Date Prophylactic Dressing Applied (if applicable): 07/03/20 Restrictions Weight Bearing Restrictions: Yes RUE Weight Bearing: Non weight bearing    Mobility  Bed Mobility Overal bed mobility: Needs Assistance Bed Mobility: Rolling;Sidelying to Sit;Sit to  Sidelying Rolling: Mod assist Sidelying to sit: Mod assist;+2 for safety/equipment     Sit to sidelying: Mod assist;+2 for safety/equipment General bed mobility comments: increased assist to maintain log roll and spinal precautions    Transfers Overall transfer level: Needs assistance Equipment used: 1 person hand held assist Transfers: Sit to/from Stand Sit to Stand: Min assist;+2 physical assistance   Squat pivot transfers: Min assist     General transfer comment: minA of 2 for safety, pt maintaining eyes closed, posterior lean/LOB needing assist  Ambulation/Gait Ambulation/Gait assistance: Mod assist Gait Distance (Feet): 2 Feet Assistive device: 1 person hand held assist Gait Pattern/deviations: Step-to pattern;Decreased stride length Gait velocity: decreased Gait velocity interpretation: <1.31 ft/sec, indicative of household ambulator General Gait Details: small lateral steps to [facility], pt with decreased alertness and not looking for safe step placement. assist to manage direction and stability      Balance Overall balance assessment: Needs assistance Sitting-balance support: Single extremity supported;Feet supported Sitting balance-Leahy Scale: Fair     Standing balance support: Single extremity supported;During functional activity Standing balance-Leahy Scale: Poor                              Cognition Arousal/Alertness: Lethargic Behavior During Therapy: Flat affect Overall Cognitive Status: Impaired/Different from baseline Area of Impairment: Orientation;Attention;Memory;Following commands;Safety/judgement;Awareness;Problem solving                 Orientation Level: Disoriented to;Place;Time;Situation Current Attention Level: Focused Memory: Decreased recall of precautions;Decreased short-term memory Following Commands: Follows one step commands inconsistently;Follows one step commands with increased time Safety/Judgement: Decreased  awareness of safety;Decreased awareness of deficits Awareness: Emergent Problem Solving: Slow  processing;Decreased initiation;Requires verbal cues General Comments: max cues at this time due to lethargy. disorientation remains      Exercises      General Comments General comments (skin integrity, edema, etc.): VSS, HR to 120s      Pertinent Vitals/Pain Pain Assessment: Faces Faces Pain Scale: Hurts even more Pain Location: back, R arm Pain Descriptors / Indicators: Discomfort;Grimacing;Guarding Pain Intervention(s): Monitored during session;Repositioned     PT Goals (current goals can now be found in the care plan section) Acute Rehab PT Goals Patient Stated Goal: to go home PT Goal Formulation: With patient Time For Goal Achievement: 07/18/20 Potential to Achieve Goals: Good Progress towards PT goals: Progressing toward goals    Frequency    Min 4X/week      PT Plan Current plan remains appropriate       AM-PAC PT "6 Clicks" Mobility   Outcome Measure  Help needed turning from your back to your side while in a flat bed without using bedrails?: A Little Help needed moving from lying on your back to sitting on the side of a flat bed without using bedrails?: A Little Help needed moving to and from a bed to a chair (including a wheelchair)?: A Little Help needed standing up from a chair using your arms (e.g., wheelchair or bedside chair)?: A Little Help needed to walk in hospital room?: A Little Help needed climbing 3-5 steps with a railing? : A Lot 6 Click Score: 17    End of Session Equipment Utilized During Treatment: Gait belt;Back brace;Cervical collar Activity Tolerance: Patient limited by lethargy Patient left: in bed;with call bell/phone within reach;with bed alarm set;with nursing/sitter in room Nurse Communication: Mobility status PT Visit Diagnosis: Other abnormalities of gait and mobility (R26.89);Pain Pain - Right/Left: Right Pain - part of body:  Shoulder;Arm     Time: 6073-7106 PT Time Calculation (min) (ACUTE ONLY): 26 min  Charges:  $Gait Training: 8-22 mins $Therapeutic Activity: 8-22 mins                     Samuel [name], PT, DPT   Acute Rehabilitation Department Pager #: [phone]   Samuel [name] 07/04/2020, 1:23 PM

## 2020-07-04 NOTE — Progress Notes (Signed)
Neurosurgery Service Progress Note  Subjective: No acute events overnight.   Objective: Vitals:   07/04/20 0603 07/04/20 0604 07/04/20 0700 07/04/20 0800  BP:  (!) 138/96 (!) 130/91 (!) 142/99  Pulse: 87 90 91 98  Resp: 20  14 (!) 23  Temp:    98.6 F (37 C)  TempSrc:    Oral  SpO2:  94% 95% 97%  Weight:      Height:        Physical Exam: Primarily spanish speaking, eyes open to stim, MAEx4, wearing TLSO  Assessment & Plan: 50 y.o. man s/p polytrauma with chance fracture, tSAH.  -no change in neurosurgical plan of care   Jadene Pierini  07/04/20 9:08 AM

## 2020-07-04 NOTE — Plan of Care (Signed)
  Problem: Clinical Measurements: Goal: Respiratory complications will improve Outcome: Progressing Goal: Cardiovascular complication will be avoided Outcome: Progressing   Problem: Activity: Goal: Risk for activity intolerance will decrease Outcome: Progressing   Problem: Nutrition: Goal: Adequate nutrition will be maintained Outcome: Progressing   Problem: Elimination: Goal: Will not experience complications related to urinary retention Outcome: Progressing   Problem: Pain Managment: Goal: General experience of comfort will improve Outcome: Progressing   Problem: Skin Integrity: Goal: Risk for impaired skin integrity will decrease Outcome: Progressing

## 2020-07-04 NOTE — Progress Notes (Signed)
Trauma Service Note  Chief Complaint/Subjective: Some agitation overnight, answering simple questions this morning  Objective: Vital signs in last 24 hours: Temp:  [98.8 F (37.1 C)-99.5 F (37.5 C)] 99.1 F (37.3 C) (06/18 0314) Pulse Rate:  [86-119] 91 (06/18 0700) Resp:  [11-45] 14 (06/18 0700) BP: (110-184)/(54-101) 130/91 (06/18 0700) SpO2:  [91 %-97 %] 95 % (06/18 0700) Last BM Date:  (6/16)  Intake/Output from previous day: 06/17 0701 - 06/18 0700 In: 2006.6 [P.O.:720; I.V.:1186.5; IV Piggyback:100.1] Out: 2800 [Urine:2800] Intake/Output this shift: No intake/output data recorded.  General: somnolent but arousable  Lungs: nonlabored  Abd: soft, NT, ND  Extremities: moves all extremities, no edema  Neuro: moves all extremities, opens eyes to touch, answers simple questions  Lab Results: CBC  Recent Labs    07/02/20 1718 07/03/20 0440  WBC 7.7 6.7  HGB 10.6* 9.9*  HCT 31.3* 28.8*  PLT 155 151   BMET Recent Labs    07/02/20 1718 07/03/20 0440  NA 136 134*  K 3.0* 3.4*  CL 101 96*  CO2 24 25  GLUCOSE 117* 132*  BUN <5* <5*  CREATININE 0.62 0.53*  CALCIUM 8.0* 8.0*   PT/INR Recent Labs    07/02/20 2014  LABPROT 15.3*  INR 1.2   ABG No results for input(s): PHART, HCO3 in the last 72 hours.  Invalid input(s): PCO2, PO2  Studies/Results: CT HEAD WO CONTRAST  Result Date: 07/03/2020 CLINICAL DATA:  Subarachnoid hemorrhage. EXAM: CT HEAD WITHOUT CONTRAST TECHNIQUE: Contiguous axial images were obtained from the base of the skull through the vertex without intravenous contrast. COMPARISON:  CT head without contrast 07/02/2020 FINDINGS: Brain: Focal subarachnoid hemorrhage in the right sylvian fissure is stable. Areas of hyperdensity noted along the falx, likely representing subdural blood, more prominent than on the prior study. No new or progressive hemorrhage. No parenchymal hemorrhage. Mild atrophy and white matter changes are stable.  Vascular: No hyperdense vessel or unexpected calcification. Skull: Right parietal scalp soft tissue swelling is present without underlying fracture. Sinuses/Orbits: The paranasal sinuses and mastoid air cells are clear. The globes and orbits are within normal limits. IMPRESSION: 1. Stable focal subarachnoid hemorrhage in the right Sylvian fissure. 2. Slight increase in subdural blood along the falx. 3. No other new or progressive hemorrhage. 4. Right parietal scalp soft tissue swelling without underlying fracture. 5. Stable atrophy and white matter disease. Electronically Signed   By: Marin Roberts M.D.   On: 07/03/2020 18:42   MR CERVICAL SPINE WO CONTRAST  Result Date: 07/03/2020 CLINICAL DATA:  Found at home after a presumed fall. Right arm and back pain. EXAM: MRI CERVICAL SPINE WITHOUT CONTRAST TECHNIQUE: Multiplanar, multisequence MR imaging of the cervical spine was performed. No intravenous contrast was administered. COMPARISON:  CT of the cervical spine 08/01/2020 FINDINGS: Alignment: Slight degenerative retrolisthesis present at C3-4. No other significant listhesis. Vertebrae: Marrow signal and vertebral body heights are normal. No acute fractures are present. Cord: Normal signal and morphology. Posterior Fossa, vertebral arteries, paraspinal tissues: Craniocervical junction is normal. Flow is present in the vertebral arteries bilaterally. Visualized intracranial contents are normal. Prevertebral soft tissues are within normal limits. No ligamentous injuries are present. Disc levels: C2-3: Negative. C3-4: Shallow central disc protrusion is present. No significant stenosis is present. C4-5: Negative. C5-6: Mild uncovertebral and foraminal narrowing is present bilaterally. C6-7: Mild uncovertebral and foraminal narrowing is present bilaterally. C7-T1: Negative. IMPRESSION: 1. No acute or healing trauma. 2. Mild uncovertebral and foraminal narrowing bilaterally at C5-6 and  C6-7. 3. Shallow central  disc protrusion at C3-4 without significant stenosis. Electronically Signed   By: Marin Roberts M.D.   On: 07/03/2020 18:28   MR THORACIC SPINE WO CONTRAST  Result Date: 07/03/2020 CLINICAL DATA:  Found at home on the ground after presumed fall. Back pain. EXAM: MRI THORACIC SPINE WITHOUT CONTRAST TECHNIQUE: Multiplanar, multisequence MR imaging of the thoracic spine was performed. No intravenous contrast was administered. COMPARISON:  None. FINDINGS: Alignment:  No significant listhesis is present. Vertebrae: Acute superior endplate fracture is present T7 with 30% loss of height anteriorly. No acute fracture is present at T10 with 40% loss of height inferiorly. No significant retropulsion of bone is present in either level. Right laminar fracture present at T10. Paraspinous edema is present at T10. Cord:  Normal signal and morphology. Paraspinal and other soft tissues: Paraspinous edema is present at both fracture levels, T7 and T10. Small pleural effusions are present, right greater than left. Paraspinous soft tissues are otherwise within normal limits. Disc levels: No significant foraminal or central canal stenosis. No significant disc disease. IMPRESSION: 1. Acute superior endplate compression fracture at T7 with 30% loss of height anteriorly. 2. Acute 40% loss of height inferiorly at T10. 3. Right laminar fracture at T10. 4. Paraspinal edema at both fracture levels. 5. Small pleural effusions, right greater than left. 6. No significant stenosis. These results will be called to the ordering clinician or representative by the Radiologist Assistant, and communication documented in the PACS or [employer]. Electronically Signed   By: Marin Roberts M.D.   On: 07/03/2020 18:33   EEG adult  Result Date: 07/03/2020 Charlsie Quest, MD     07/03/2020 10:58 AM Patient Name: Jones Skene Isak Sotomayor MRN: [id number] Epilepsy Attending: Charlsie Quest Referring Physician/Provider: Dr  Kris Mouton Date: 07/03/2020 Duration: 23.10 mins Patient history: 50 year old male with subarachnoid hemorrhage and syncope.  EEG to evaluate for seizures. Level of alertness: Awake AEDs during EEG study: Keppra Technical aspects: This EEG study was done with scalp electrodes positioned according to the 10-20 International system of electrode placement. Electrical activity was acquired at a sampling rate of 500Hz  and reviewed with a high frequency filter of 70Hz  and a low frequency filter of 1Hz . EEG data were recorded continuously and digitally stored. Description: The posterior dominant rhythm consists of 9 Hz activity of moderate voltage (25-35 uV) seen predominantly in posterior head regions, symmetric and reactive to eye opening and eye closing. Hyperventilation and photic stimulation were not performed.   IMPRESSION: This study is within normal limits. No seizures or epileptiform discharges were seen throughout the recording.   ECHOCARDIOGRAM COMPLETE  Result Date: 07/03/2020    ECHOCARDIOGRAM REPORT   Patient Name:   [facility] Date of Exam: 07/03/2020 Medical Rec #:  [facility]                       Height:       60.0 in Accession #:    [facility]                      Weight:       132.7 lb Date of Birth:  May 01, 1970                       BSA:          1.568 m Patient Age:    69  years                        BP:           150/93 mmHg Patient Gender: M                               HR:           91 bpm. Exam Location:  Inpatient Procedure: 2D Echo Indications:    syncope  History:        Patient has no prior history of Echocardiogram examinations.                 Risk Factors:Alcohol abuse.  Sonographer:    Delcie RochLauren Pennington Referring Phys: [id number] AYESHA N LOVICK IMPRESSIONS  1. Left ventricular ejection fraction, by estimation, is 65 to 70%. The left ventricle has normal function. The left ventricle has no regional wall motion abnormalities. Left ventricular  diastolic parameters were normal.  2. Right ventricular systolic function is normal. The right ventricular size is normal.  3. The mitral valve is normal in structure. No evidence of mitral valve regurgitation. No evidence of mitral stenosis.  4. The aortic valve is normal in structure. Aortic valve regurgitation is not visualized. No aortic stenosis is present. FINDINGS  Left Ventricle: Left ventricular ejection fraction, by estimation, is 65 to 70%. The left ventricle has normal function. The left ventricle has no regional wall motion abnormalities. The left ventricular internal cavity size was normal in size. There is  no left ventricular hypertrophy. Left ventricular diastolic parameters were normal. Right Ventricle: The right ventricular size is normal. Right vetricular wall thickness was not well visualized. Right ventricular systolic function is normal. Left Atrium: Left atrial size was normal in size. Right Atrium: Right atrial size was normal in size. Pericardium: There is no evidence of pericardial effusion. Mitral Valve: The mitral valve is normal in structure. No evidence of mitral valve regurgitation. No evidence of mitral valve stenosis. Tricuspid Valve: The tricuspid valve is normal in structure. Tricuspid valve regurgitation is not demonstrated. No evidence of tricuspid stenosis. Aortic Valve: The aortic valve is normal in structure. Aortic valve regurgitation is not visualized. No aortic stenosis is present. Pulmonic Valve: The pulmonic valve was normal in structure. Pulmonic valve regurgitation is not visualized. Aorta: The aortic root and ascending aorta are structurally normal, with no evidence of dilitation. IAS/Shunts: The atrial septum is grossly normal.  LEFT VENTRICLE PLAX 2D LVIDd:         4.50 cm  Diastology LVIDs:         3.10 cm  LV e' medial:    8.27 cm/s LV PW:         1.00 cm  LV E/e' medial:  7.9 LV IVS:        1.20 cm  LV e' lateral:   9.57 cm/s LVOT diam:     1.80 cm  LV E/e'  lateral: 6.8 LV SV:         45 LV SV Index:   29 LVOT Area:     2.54 cm  RIGHT VENTRICLE         IVC TAPSE (M-mode): 1.6 cm  IVC diam: 1.30 cm LEFT ATRIUM             Index       RIGHT ATRIUM           Index  LA diam:        3.60 cm 2.30 cm/m  RA Area:     13.70 cm LA Vol (A2C):   42.9 ml 27.36 ml/m RA Volume:   31.00 ml  19.77 ml/m LA Vol (A4C):   47.4 ml 30.23 ml/m LA Biplane Vol: 46.0 ml 29.34 ml/m  AORTIC VALVE LVOT Vmax:   99.20 cm/s LVOT Vmean:  61.300 cm/s LVOT VTI:    0.177 m  AORTA Ao Root diam: 3.20 cm Ao Asc diam:  3.10 cm MITRAL VALVE MV Area (PHT): 3.27 cm    SHUNTS MV Decel Time: 232 msec    Systemic VTI:  0.18 m MV E velocity: 65.10 cm/s  Systemic Diam: 1.80 cm MV A velocity: 77.60 cm/s MV E/A ratio:  0.84 Kristeen Miss MD Electronically signed by Kristeen Miss MD Signature Date/Time: 07/03/2020/4:59:20 PM    Final     Anti-infectives: Anti-infectives (From admission, onward)    None       Medications Scheduled Meds:  acetaminophen  650 mg Oral Q6H   Chlorhexidine Gluconate Cloth  6 each Topical Daily   docusate sodium  100 mg Oral BID   folic acid  1 mg Oral Daily   levETIRAcetam  500 mg Oral BID   methocarbamol  1,000 mg Oral Q8H   multivitamin with minerals  1 tablet Oral Daily   spiritus frumenti  1 each Oral TID WC   thiamine  100 mg Oral Daily   Or   thiamine  100 mg Intravenous Daily   Continuous Infusions:  sodium chloride 50 mL/hr at 07/04/20 0600   PRN Meds:.bisacodyl, HYDROmorphone (DILAUDID) injection, LORazepam **OR** LORazepam, ondansetron **OR** ondansetron (ZOFRAN) IV, oxyCODONE  Assessment/Plan: Fall   SAH - NSGY c/s, Dr. 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 Conchita Paris, TLSO brace for now, assess mobility/pain control in brace, MRI T-spine 6/17 T7 endplate fracture - NSGY c/s, Dr. Conchita Paris, pain control, already in TLSO brace Cervicalgia - cont c-collar, MRI  6/17noted Right first rib fx - EKG, pain control, pulm toilet EtOH abuse - TOC c/s, CIWA, beer with meals, thiamine/folate Right scapular fracture, R clavicle fx - ortho c/s, Dr. Magnus Ivan, sling for now, final recs pending Elevated LFTs - monitor Questionable syncope - echo, carotid duplex, EEG 6/17 FEN - adv to reg diet, repeat lytes DVT - SCDs, hold LMWH until stable head CT and okay with NSGY given spine fx Dispo - ICU for CIWA management   LOS: 2 days   De Blanch Tyheim [place] Trauma Surgeon [phone] Surgery 07/04/2020

## 2020-07-05 ENCOUNTER — Encounter (HOSPITAL_COMMUNITY): Payer: Self-pay

## 2020-07-05 LAB — BASIC METABOLIC PANEL
Anion gap: 8 (ref 5–15)
BUN: <5 mg/dL — ABNORMAL LOW (ref 6–20)
CO2: 25 mmol/L (ref 22–32)
Calcium: 8.5 mg/dL — ABNORMAL LOW (ref 8.9–10.3)
Chloride: 97 mmol/L — ABNORMAL LOW (ref 98–111)
Creatinine, Ser: 0.53 mg/dL — ABNORMAL LOW (ref 0.61–1.24)
GFR, Estimated: >60 mL/min (ref >60)
Glucose, Bld: 111 mg/dL — ABNORMAL HIGH (ref 70–99)
Potassium: 3.8 mmol/L (ref 3.5–5.1)
Sodium: 130 mmol/L — ABNORMAL LOW (ref 135–145)

## 2020-07-05 LAB — MAGNESIUM: Magnesium: 1.6 mg/dL — ABNORMAL LOW (ref 1.7–2.4)

## 2020-07-05 LAB — PHOSPHORUS: Phosphorus: 2.8 mg/dL (ref 2.5–4.6)

## 2020-07-05 MED ORDER — SODIUM CHLORIDE 0.9 % IV SOLN
INTRAVENOUS | Status: DC | PRN
  Administered 2020-07-05: 500 mL via INTRAVENOUS

## 2020-07-05 MED ORDER — LORAZEPAM 2 MG/ML IJ SOLN
1.0000 mg | INTRAMUSCULAR | Status: AC | PRN
Start: 2020-07-05 — End: 2020-07-08
  Administered 2020-07-06: 1 mg via INTRAVENOUS
  Administered 2020-07-06: 2 mg via INTRAVENOUS
  Administered 2020-07-06: 1 mg via INTRAVENOUS
  Administered 2020-07-06 (×2): 2 mg via INTRAVENOUS
  Administered 2020-07-06: 1 mg via INTRAVENOUS
  Administered 2020-07-06 – 2020-07-07 (×3): 2 mg via INTRAVENOUS
  Filled 2020-07-05 (×8): qty 1

## 2020-07-05 MED ORDER — LORAZEPAM 1 MG PO TABS
1.0000 mg | ORAL_TABLET | ORAL | Status: AC | PRN
Start: 2020-07-05 — End: 2020-07-08
  Administered 2020-07-07: 2 mg via ORAL
  Administered 2020-07-07: 1 mg via ORAL
  Administered 2020-07-07 (×4): 2 mg via ORAL
  Filled 2020-07-05 (×2): qty 1
  Filled 2020-07-05: qty 2
  Filled 2020-07-05: qty 1
  Filled 2020-07-05 (×3): qty 2

## 2020-07-05 MED ORDER — DEXMEDETOMIDINE HCL IN NACL 400 MCG/100ML IV SOLN
0.4000 ug/kg/h | INTRAVENOUS | Status: DC
  Administered 2020-07-05 (×2): 1.2 ug/kg/h via INTRAVENOUS
  Administered 2020-07-05: 0.4 ug/kg/h via INTRAVENOUS
  Administered 2020-07-06: 1.2 ug/kg/h via INTRAVENOUS
  Administered 2020-07-06: 0.9 ug/kg/h via INTRAVENOUS
  Filled 2020-07-05 (×6): qty 100

## 2020-07-05 MED ORDER — MAGNESIUM SULFATE 4 GM/100ML IV SOLN
4.0000 g | Freq: Once | INTRAVENOUS | Status: AC
  Administered 2020-07-05: 4 g via INTRAVENOUS
  Filled 2020-07-05: qty 100

## 2020-07-05 MED ORDER — K PHOS MONO-SOD PHOS DI & MONO 155-852-130 MG PO TABS
250.0000 mg | ORAL_TABLET | Freq: Every day | ORAL | Status: DC
  Administered 2020-07-05 – 2020-07-21 (×17): 250 mg via ORAL
  Filled 2020-07-05 (×17): qty 1

## 2020-07-05 NOTE — Progress Notes (Signed)
Neurosurgery Service Progress Note  Subjective: No acute events overnight, still agitated / in w/d  Objective: Vitals:   07/05/20 0600 07/05/20 0648 07/05/20 0700 07/05/20 0729  BP: (!) 146/96   125/87  Pulse: 90 92    Resp: 16  (!) 27   Temp:      TempSrc:      SpO2: 91%     Weight:      Height:        Physical Exam: Primarily spanish speaking, eyes open to stim, MAEx4, wearing TLSO, endorses some low back pain but says it's mild to moderate, not severe  Assessment & Plan: 50 y.o. man s/p polytrauma with chance fracture, tSAH.  -no change in neurosurgical plan of care, continue TLSO  Jadene Pierini  07/05/20 8:57 AM

## 2020-07-05 NOTE — Progress Notes (Signed)
Chief Complaint/Subjective: Agitation overnight started on precedex, continues to try to get mits off  Review of Systems See above, otherwise other systems negative  @PMH @ @PSH @ @FMH @  Objective: Vital signs in last 24 hours: Temp:  [97.5 F (36.4 C)-98.4 F (36.9 C)] 98.4 F (36.9 C) (06/19 0008) Pulse Rate:  [85-127] 92 (06/19 0648) Resp:  [13-29] 27 (06/19 0700) BP: (119-158)/(66-125) 125/87 (06/19 0729) SpO2:  [86 %-98 %] 91 % (06/19 0600) Last BM Date: 07/02/20 Intake/Output from previous day: 06/18 0701 - 06/19 0700 In: 1388.3 [P.O.:830; I.V.:293.6; IV Piggyback:264.7] Out: 3975 [Urine:3975] Intake/Output this shift: No intake/output data recorded.  PE: Gen: NAD Resp: nonlabored Card: RRR Abd: soft, NT, ND Neuro: moves all extremities, opens eyes to voice, answers some questions but AOx2  Lab Results:  Recent Labs    07/02/20 1718 07/03/20 0440  WBC 7.7 6.7  HGB 10.6* 9.9*  HCT 31.3* 28.8*  PLT 155 151   BMET Recent Labs    07/04/20 0836 07/05/20 0109  NA 131* 130*  K 3.9 3.8  CL 99 97*  CO2 24 25  GLUCOSE 102* 111*  BUN <5* <5*  CREATININE 0.54* 0.53*  CALCIUM 7.9* 8.5*   PT/INR Recent Labs    07/02/20 2014  LABPROT 15.3*  INR 1.2   CMP     Component Value Date/Time   NA 130 (L) 07/05/2020 0109   K 3.8 07/05/2020 0109   CL 97 (L) 07/05/2020 0109   CO2 25 07/05/2020 0109   GLUCOSE 111 (H) 07/05/2020 0109   BUN <5 (L) 07/05/2020 0109   CREATININE 0.53 (L) 07/05/2020 0109   CALCIUM 8.5 (L) 07/05/2020 0109   PROT 6.9 07/03/2020 0440   ALBUMIN 3.7 07/03/2020 0440   AST 193 (H) 07/03/2020 0440   ALT 92 (H) 07/03/2020 0440   ALKPHOS 77 07/03/2020 0440   BILITOT 0.9 07/03/2020 0440   GFRNONAA >60 07/05/2020 0109   GFRAA >60 09/30/2019 0142   Lipase  No results found for: LIPASE  Studies/Results: CT HEAD WO CONTRAST  Result Date: 07/03/2020 CLINICAL DATA:  Subarachnoid hemorrhage. EXAM: CT HEAD WITHOUT CONTRAST TECHNIQUE:  Contiguous axial images were obtained from the base of the skull through the vertex without intravenous contrast. COMPARISON:  CT head without contrast 07/02/2020 FINDINGS: Brain: Focal subarachnoid hemorrhage in the right sylvian fissure is stable. Areas of hyperdensity noted along the falx, likely representing subdural blood, more prominent than on the prior study. No new or progressive hemorrhage. No parenchymal hemorrhage. Mild atrophy and white matter changes are stable. Vascular: No hyperdense vessel or unexpected calcification. Skull: Right parietal scalp soft tissue swelling is present without underlying fracture. Sinuses/Orbits: The paranasal sinuses and mastoid air cells are clear. The globes and orbits are within normal limits. IMPRESSION: 1. Stable focal subarachnoid hemorrhage in the right Sylvian fissure. 2. Slight increase in subdural blood along the falx. 3. No other new or progressive hemorrhage. 4. Right parietal scalp soft tissue swelling without underlying fracture. 5. Stable atrophy and white matter disease. Electronically Signed   By: 10/02/2019 M.D.   On: 07/03/2020 18:42   MR CERVICAL SPINE WO CONTRAST  Result Date: 07/03/2020 CLINICAL DATA:  Found at home after a presumed fall. Right arm and back pain. EXAM: MRI CERVICAL SPINE WITHOUT CONTRAST TECHNIQUE: Multiplanar, multisequence MR imaging of the cervical spine was performed. No intravenous contrast was administered. COMPARISON:  CT of the cervical spine 08/01/2020 FINDINGS: Alignment: Slight degenerative retrolisthesis present at C3-4. No  other significant listhesis. Vertebrae: Marrow signal and vertebral body heights are normal. No acute fractures are present. Cord: Normal signal and morphology. Posterior Fossa, vertebral arteries, paraspinal tissues: Craniocervical junction is normal. Flow is present in the vertebral arteries bilaterally. Visualized intracranial contents are normal. Prevertebral soft tissues are within  normal limits. No ligamentous injuries are present. Disc levels: C2-3: Negative. C3-4: Shallow central disc protrusion is present. No significant stenosis is present. C4-5: Negative. C5-6: Mild uncovertebral and foraminal narrowing is present bilaterally. C6-7: Mild uncovertebral and foraminal narrowing is present bilaterally. C7-T1: Negative. IMPRESSION: 1. No acute or healing trauma. 2. Mild uncovertebral and foraminal narrowing bilaterally at C5-6 and C6-7. 3. Shallow central disc protrusion at C3-4 without significant stenosis. Electronically Signed   By: Marin Roberts M.D.   On: 07/03/2020 18:28   MR THORACIC SPINE WO CONTRAST  Result Date: 07/03/2020 CLINICAL DATA:  Found at home on the ground after presumed fall. Back pain. EXAM: MRI THORACIC SPINE WITHOUT CONTRAST TECHNIQUE: Multiplanar, multisequence MR imaging of the thoracic spine was performed. No intravenous contrast was administered. COMPARISON:  None. FINDINGS: Alignment:  No significant listhesis is present. Vertebrae: Acute superior endplate fracture is present T7 with 30% loss of height anteriorly. No acute fracture is present at T10 with 40% loss of height inferiorly. No significant retropulsion of bone is present in either level. Right laminar fracture present at T10. Paraspinous edema is present at T10. Cord:  Normal signal and morphology. Paraspinal and other soft tissues: Paraspinous edema is present at both fracture levels, T7 and T10. Small pleural effusions are present, right greater than left. Paraspinous soft tissues are otherwise within normal limits. Disc levels: No significant foraminal or central canal stenosis. No significant disc disease. IMPRESSION: 1. Acute superior endplate compression fracture at T7 with 30% loss of height anteriorly. 2. Acute 40% loss of height inferiorly at T10. 3. Right laminar fracture at T10. 4. Paraspinal edema at both fracture levels. 5. Small pleural effusions, right greater than left. 6. No  significant stenosis. These results will be called to the ordering clinician or representative by the Radiologist Assistant, and communication documented in the PACS or [employer]. Electronically Signed   By: Marin Roberts M.D.   On: 07/03/2020 18:33   EEG adult  Result Date: 07/03/2020 Charlsie Quest, MD     07/03/2020 10:58 AM Patient Name: Samuel [name] Branden Shallenberger MRN: [id number] Epilepsy Attending: Charlsie Quest Referring Physician/Provider: Dr Kris Mouton Date: 07/03/2020 Duration: 23.10 mins Patient history: 50 year old male with subarachnoid hemorrhage and syncope.  EEG to evaluate for seizures. Level of alertness: Awake AEDs during EEG study: Keppra Technical aspects: This EEG study was done with scalp electrodes positioned according to the 10-20 International system of electrode placement. Electrical activity was acquired at a sampling rate of 500Hz  and reviewed with a high frequency filter of 70Hz  and a low frequency filter of 1Hz . EEG data were recorded continuously and digitally stored. Description: The posterior dominant rhythm consists of 9 Hz activity of moderate voltage (25-35 uV) seen predominantly in posterior head regions, symmetric and reactive to eye opening and eye closing. Hyperventilation and photic stimulation were not performed.   IMPRESSION: This study is within normal limits. No seizures or epileptiform discharges were seen throughout the recording.   ECHOCARDIOGRAM COMPLETE  Result Date: 07/03/2020    ECHOCARDIOGRAM REPORT   Patient Name:   [facility] Date of Exam: 07/03/2020 Medical Rec #:  [facility]  Height:       60.0 in Accession #:    [id number]                      Weight:       132.7 lb Date of Birth:  09-Feb-1970                       BSA:          1.568 m Patient Age:    49 years                        BP:           150/93 mmHg Patient Gender: M                               HR:            91 bpm. Exam Location:  Inpatient Procedure: 2D Echo Indications:    syncope  History:        Patient has no prior history of Echocardiogram examinations.                 Risk Factors:Alcohol abuse.  Sonographer:    Delcie Roch Referring Phys: [id number] AYESHA N LOVICK IMPRESSIONS  1. Left ventricular ejection fraction, by estimation, is 65 to 70%. The left ventricle has normal function. The left ventricle has no regional wall motion abnormalities. Left ventricular diastolic parameters were normal.  2. Right ventricular systolic function is normal. The right ventricular size is normal.  3. The mitral valve is normal in structure. No evidence of mitral valve regurgitation. No evidence of mitral stenosis.  4. The aortic valve is normal in structure. Aortic valve regurgitation is not visualized. No aortic stenosis is present. FINDINGS  Left Ventricle: Left ventricular ejection fraction, by estimation, is 65 to 70%. The left ventricle has normal function. The left ventricle has no regional wall motion abnormalities. The left ventricular internal cavity size was normal in size. There is  no left ventricular hypertrophy. Left ventricular diastolic parameters were normal. Right Ventricle: The right ventricular size is normal. Right vetricular wall thickness was not well visualized. Right ventricular systolic function is normal. Left Atrium: Left atrial size was normal in size. Right Atrium: Right atrial size was normal in size. Pericardium: There is no evidence of pericardial effusion. Mitral Valve: The mitral valve is normal in structure. No evidence of mitral valve regurgitation. No evidence of mitral valve stenosis. Tricuspid Valve: The tricuspid valve is normal in structure. Tricuspid valve regurgitation is not demonstrated. No evidence of tricuspid stenosis. Aortic Valve: The aortic valve is normal in structure. Aortic valve regurgitation is not visualized. No aortic stenosis is present. Pulmonic Valve: The  pulmonic valve was normal in structure. Pulmonic valve regurgitation is not visualized. Aorta: The aortic root and ascending aorta are structurally normal, with no evidence of dilitation. IAS/Shunts: The atrial septum is grossly normal.  LEFT VENTRICLE PLAX 2D LVIDd:         4.50 cm  Diastology LVIDs:         3.10 cm  LV e' medial:    8.27 cm/s LV PW:         1.00 cm  LV E/e' medial:  7.9 LV IVS:        1.20 cm  LV e' lateral:   9.57 cm/s LVOT diam:  1.80 cm  LV E/e' lateral: 6.8 LV SV:         45 LV SV Index:   29 LVOT Area:     2.54 cm  RIGHT VENTRICLE         IVC TAPSE (M-mode): 1.6 cm  IVC diam: 1.30 cm LEFT ATRIUM             Index       RIGHT ATRIUM           Index LA diam:        3.60 cm 2.30 cm/m  RA Area:     13.70 cm LA Vol (A2C):   42.9 ml 27.36 ml/m RA Volume:   31.00 ml  19.77 ml/m LA Vol (A4C):   47.4 ml 30.23 ml/m LA Biplane Vol: 46.0 ml 29.34 ml/m  AORTIC VALVE LVOT Vmax:   99.20 cm/s LVOT Vmean:  61.300 cm/s LVOT VTI:    0.177 m  AORTA Ao Root diam: 3.20 cm Ao Asc diam:  3.10 cm MITRAL VALVE MV Area (PHT): 3.27 cm    SHUNTS MV Decel Time: 232 msec    Systemic VTI:  0.18 m MV E velocity: 65.10 cm/s  Systemic Diam: 1.80 cm MV A velocity: 77.60 cm/s MV E/A ratio:  0.84 Kristeen MissPhilip Nahser MD Electronically signed by Kristeen MissPhilip Nahser MD Signature Date/Time: 07/03/2020/4:59:20 PM    Final     Anti-infectives: Anti-infectives (From admission, onward)    None       [facility]   SAH - NSGY c/s, Dr. 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 Conchita ParisNundkumar, TLSO brace for now, assess mobility/pain control in brace, MRI T-spine 6/17 T7 endplate fracture - NSGY c/s, Dr. Conchita ParisNundkumar, pain control, already in TLSO brace Cervicalgia - cont c-collar, MRI 6/17noted Right first rib fx - EKG, pain control, pulm toilet EtOH abuse - TOC c/s, CIWA, beer with meals, thiamine/folate, precedex Right scapular fracture, R clavicle  fx - ortho c/s, Dr. Magnus IvanBlackman, sling for now, final recs pending Elevated LFTs - monitor Questionable syncope - echo, carotid duplex, EEG 6/17 FEN - adv to reg diet, repeat lytes DVT - SCDs, hold LMWH until stable head CT and okay with NSGY given spine fx Dispo - ICU for CIWA management   LOS: 3 days   Rodman PickleLuke Aaron Jahrell Hamor , [facility] 07/05/2020, 8:45 AM Please see Amion for pager number during day hours 7:00am-4:30pm or 7:00am -11:30am on weekends

## 2020-07-05 NOTE — Progress Notes (Signed)
Updated Dr. Freida Busman. Patient remains restless/agitated despite continued ativan administration. Safety mitts have been applied all shift. Patient is able to get safety mitts off. Patient messes with brace and cervical collar. Patient messing with IV sites. Patient messing with condom catheter. Patient educated and reoriented. Patient continues to be agitated and to mess with medical equipment. New orders received for bilateral wrist restraints, and a precedex infusion. Will continue to monitor.

## 2020-07-06 ENCOUNTER — Inpatient Hospital Stay (HOSPITAL_COMMUNITY): Payer: Self-pay

## 2020-07-06 LAB — MAGNESIUM: Magnesium: 1.9 mg/dL (ref 1.7–2.4)

## 2020-07-06 LAB — BASIC METABOLIC PANEL
Anion gap: 11 (ref 5–15)
BUN: 8 mg/dL (ref 6–20)
CO2: 25 mmol/L (ref 22–32)
Calcium: 9.3 mg/dL (ref 8.9–10.3)
Chloride: 95 mmol/L — ABNORMAL LOW (ref 98–111)
Creatinine, Ser: 0.58 mg/dL — ABNORMAL LOW (ref 0.61–1.24)
GFR, Estimated: >60 mL/min (ref >60)
Glucose, Bld: 114 mg/dL — ABNORMAL HIGH (ref 70–99)
Potassium: 4 mmol/L (ref 3.5–5.1)
Sodium: 131 mmol/L — ABNORMAL LOW (ref 135–145)

## 2020-07-06 LAB — PHOSPHORUS: Phosphorus: 3.9 mg/dL (ref 2.5–4.6)

## 2020-07-06 IMAGING — DX DG THORACIC SPINE 2V
2 series · 2 of 2 positions shown · non-contrast
Comparison: [DATE]

CLINICAL DATA: Thoracic spine fracture

EXAM:
THORACIC SPINE 2 VIEWS

[t-spine ap]
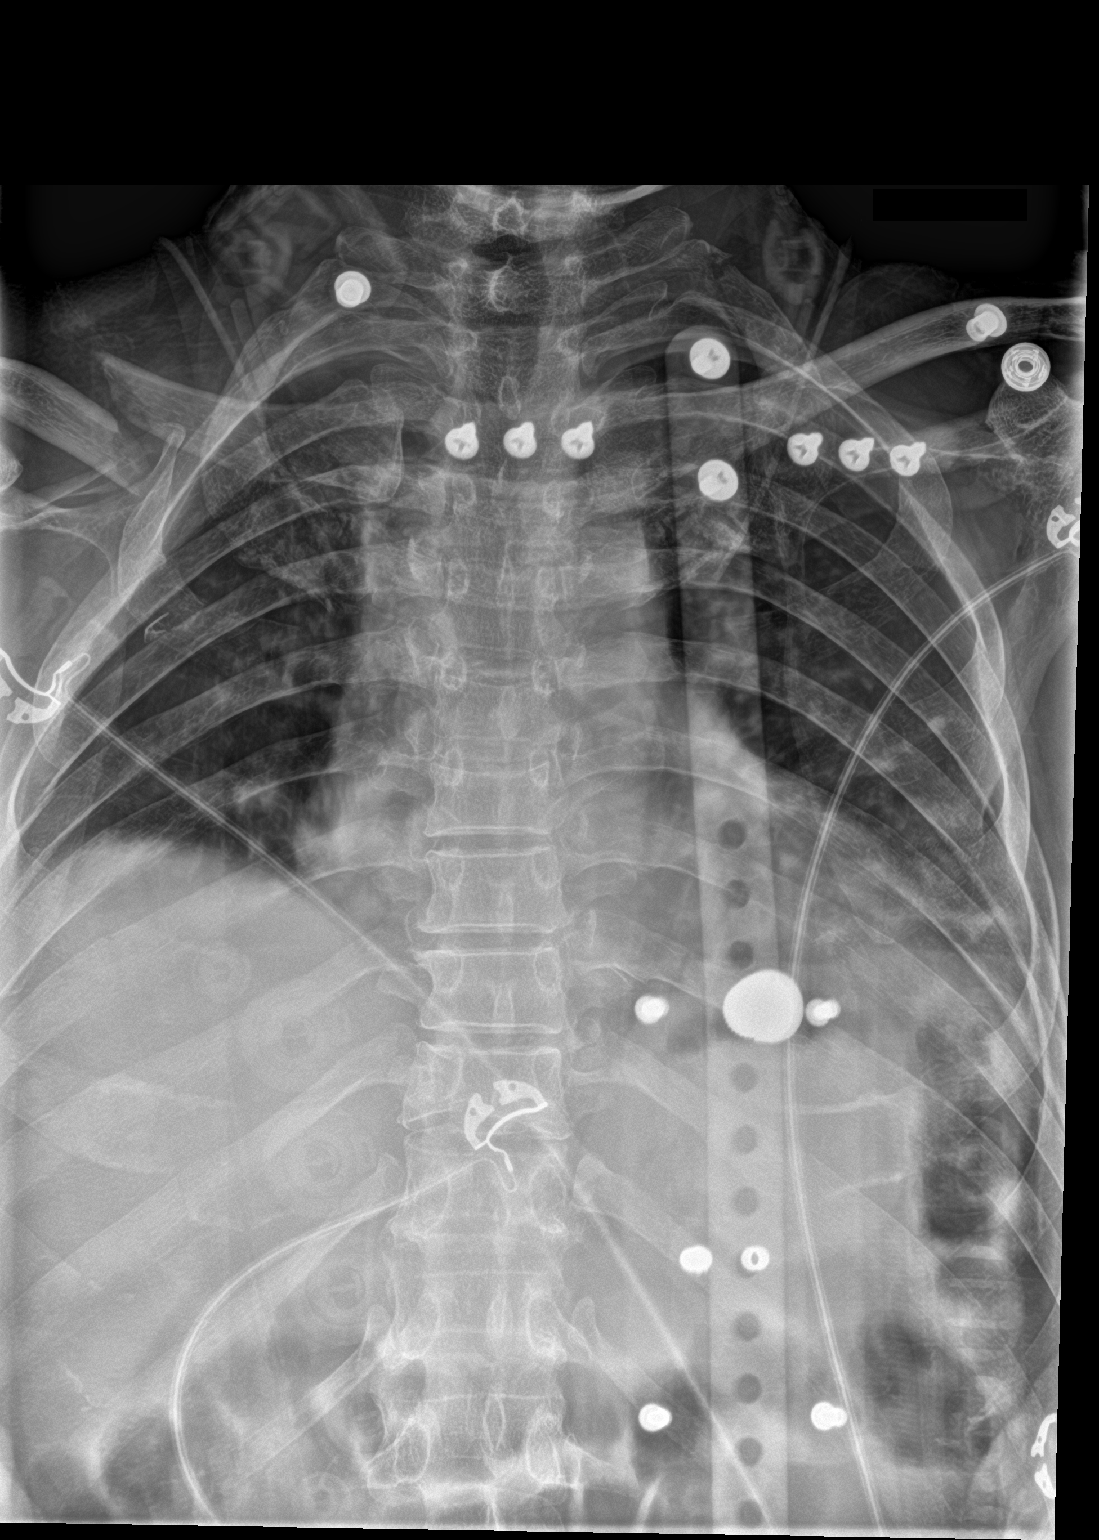

[t-spine lat]
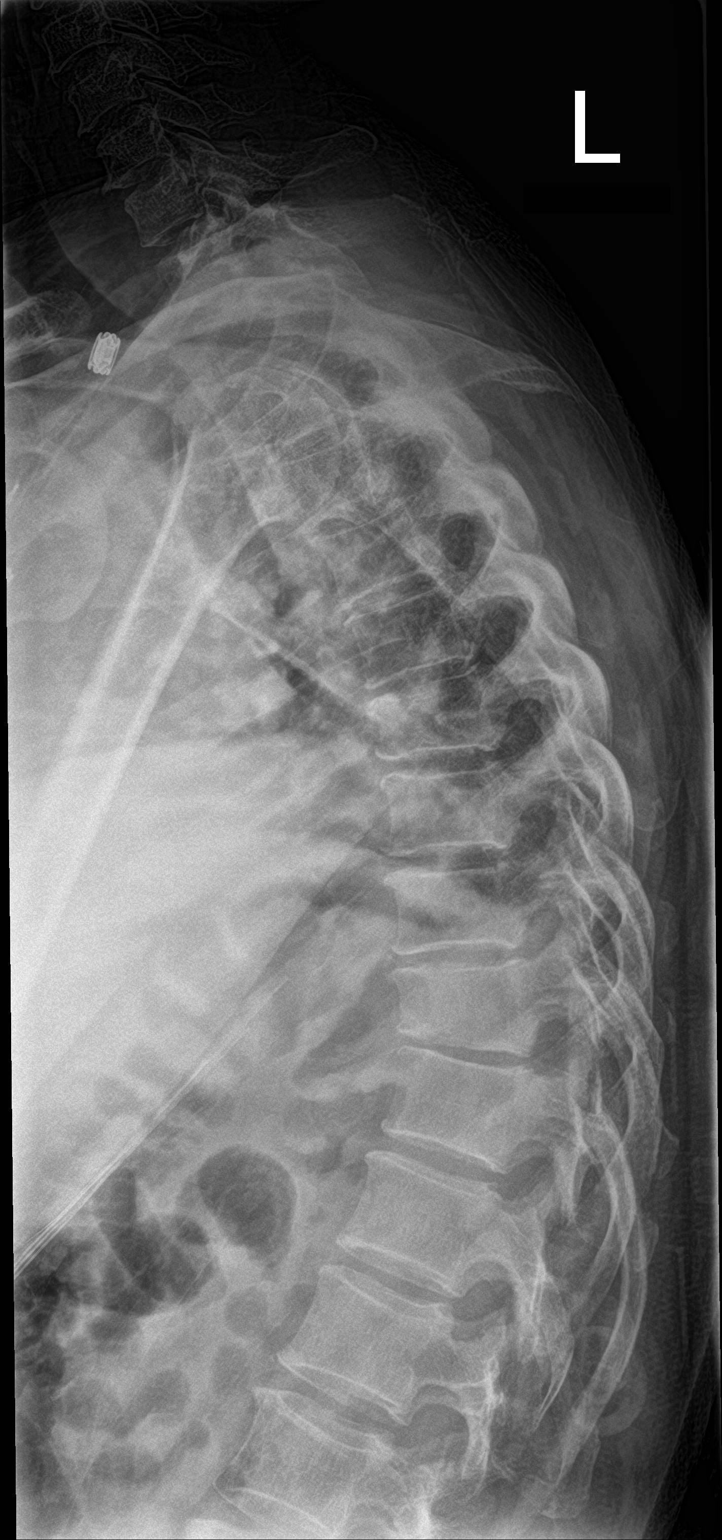

[2 of 2 positions shown; findings below may reference images not displayed]

FINDINGS: Compression deformities of 2 thoracic vertebra are identified,
approximately T7 and T10, stable.

Mild anterior height losses at both levels.

No additional fracture or subluxation.
IMPRESSION: Unchanged appearance of anterior compression fractures of T7 and
T10.

## 2020-07-06 MED ORDER — ENOXAPARIN SODIUM 30 MG/0.3ML IJ SOSY
30.0000 mg | PREFILLED_SYRINGE | Freq: Two times a day (BID) | INTRAMUSCULAR | Status: DC
Start: 2020-07-06 — End: 2020-07-21
  Administered 2020-07-06 – 2020-07-21 (×30): 30 mg via SUBCUTANEOUS
  Filled 2020-07-06 (×31): qty 0.3

## 2020-07-06 MED ORDER — ALBUMIN HUMAN 5 % IV SOLN
12.5000 g | Freq: Once | INTRAVENOUS | Status: AC
  Administered 2020-07-06: 12.5 g via INTRAVENOUS

## 2020-07-06 MED ORDER — ALBUMIN HUMAN 5 % IV SOLN
INTRAVENOUS | Status: AC
  Filled 2020-07-06: qty 250

## 2020-07-06 NOTE — Progress Notes (Signed)
Seen pt lateral lying on bed, pulled out ecg cable, and cloth, trying to go out bed, too restless and agitated and confused, asked interpreter thru [employer], pt can tell name and birthday but not his age, pt only knows he was in the house but not in hospital, re- orient pt but kept on telling he need to go out now to clean outside since there are too many leaves on the ground, pt complaining of sever pain on the chest and shoulder right side,told that medicine was already given,as interpreted to him

## 2020-07-06 NOTE — Progress Notes (Signed)
Physical Therapy Treatment Patient Details Name: Samuel [name] MRN: [id number] DOB: 11-25-1970 Today's Date: 07/06/2020    History of Present Illness 50 y.o. male found down outside his house - fall vs syncope.. Pt with T10, T 7 spinal fxs, cerivcalgia, R first rib fx, R scapula and clavicle fxs (sling and NWB - pending work up), questionable syncope, TBI with SAH; ETOH abuse - on CIWA.    PT Comments    Pt seen this morning for attempt to progress with OOB mobility. However, session limited by continued lethargy and drop in BP with transition to sitting EOB. BP 99/72 (82) with pt supine in bed, RN present and assisting with transition to sitting EOB, once sitting EOB, a series of BP were taken with progressively lower MAP, lowest 66/52 (57) so pt was returned to supine where BP recovered to 91/67 (77). Pt remains disoriented to place, time, and situation, required max cues to attend to questions and mobility due to lethargy at this time. PT will continue to follow and progress with OOB mobility as tolerated.    Follow Up Recommendations  Outpatient PT     Equipment Recommendations  None recommended by PT    Recommendations for Other Services       Precautions / Restrictions Precautions Precautions: Fall;Cervical;Back;Shoulder Type of Shoulder Precautions: sling and NWB at this time Shoulder Interventions: Shoulder sling/immobilizer Precaution Booklet Issued: No Precaution Comments: verbally reveiwed, pt unable to recal despite max cues at any time through session Required Braces or Orthoses: Cervical Brace;Spinal Brace;Sling Cervical Brace: Hard collar;At all times Spinal Brace: Thoracolumbosacral orthotic Restrictions Weight Bearing Restrictions: Yes RUE Weight Bearing: Non weight bearing    Mobility  Bed Mobility Overal bed mobility: Needs Assistance Bed Mobility: Rolling;Sidelying to Sit;Sit to Sidelying Rolling: Max assist Sidelying to sit: Max  assist;+2 for physical assistance     Sit to sidelying: Max assist;+2 for physical assistance General bed mobility comments: maxA due to pt lethargy, decreased attempt to assist despite max cues for attending. upon achieving sitting, pt BP to 66/52 (57) and pt stating he is dizzy, returned to supine    Transfers                 General transfer comment: deferred due to drop in BP    Balance Overall balance assessment: Needs assistance Sitting-balance support: Single extremity supported;Feet unsupported Sitting balance-Leahy Scale: Poor Sitting balance - Comments: maxA to maintain, pt with L lean Postural control: Left lateral lean                                  Cognition Arousal/Alertness: Lethargic Behavior During Therapy: Flat affect Overall Cognitive Status: Impaired/Different from baseline Area of Impairment: Orientation;Attention;Memory;Following commands;Safety/judgement;Awareness;Problem solving                 Orientation Level: Disoriented to;Situation Current Attention Level: Focused Memory: Decreased recall of precautions;Decreased short-term memory Following Commands: Follows one step commands inconsistently;Follows one step commands with increased time Safety/Judgement: Decreased awareness of safety;Decreased awareness of deficits Awareness: Emergent Problem Solving: Slow processing;Decreased initiation;Requires verbal cues General Comments: max cues to maintain eyes open and attention to task at this time due to lethargy. disorientation remains (pt stating he was in [place], and today was tuesday)      Exercises      General Comments General comments (skin integrity, edema, etc.): BP 99/72 (82) with pt supine in  bed, RN present and assisting with transition to sitting EOB, once sitting EOB a series of BP were taken with progressively lower MAP, lowest 66/52 (57) so pt was returned to supine where BP recovered to 91/67 (77)       Pertinent Vitals/Pain Pain Assessment: Faces Pain Score: 0-No pain Faces Pain Scale: Hurts a little bit Pain Location: slight grimacing, states his hands hurt Pain Descriptors / Indicators: Discomfort;Grimacing;Guarding Pain Intervention(s): Limited activity within patient's tolerance;Monitored during session;Repositioned     PT Goals (current goals can now be found in the care plan section) Acute Rehab PT Goals Patient Stated Goal: to go home PT Goal Formulation: With patient Time For Goal Achievement: 07/18/20 Potential to Achieve Goals: Good Progress towards PT goals: Not progressing toward goals - comment (lethargy)    Frequency    Min 4X/week      PT Plan Current plan remains appropriate       AM-PAC PT "6 Clicks" Mobility   Outcome Measure  Help needed turning from your back to your side while in a flat bed without using bedrails?: A Lot Help needed moving from lying on your back to sitting on the side of a flat bed without using bedrails?: A Lot Help needed moving to and from a bed to a chair (including a wheelchair)?: A Lot Help needed standing up from a chair using your arms (e.g., wheelchair or bedside chair)?: A Lot Help needed to walk in hospital room?: A Lot Help needed climbing 3-5 steps with a railing? : A Lot 6 Click Score: 12    End of Session Equipment Utilized During Treatment: Gait belt;Back brace;Cervical collar Activity Tolerance: Patient limited by lethargy;Treatment limited secondary to medical complications (Comment) (soft BP) Patient left: in bed;with call bell/phone within reach;with bed alarm set;with nursing/sitter in room Nurse Communication: Mobility status PT Visit Diagnosis: Other abnormalities of gait and mobility (R26.89);Pain Pain - Right/Left: Right Pain - part of body: Shoulder;Arm     Time: 0951-1010 PT Time Calculation (min) (ACUTE ONLY): 19 min  Charges:  $Therapeutic Activity: 8-22 mins                     [name],  PT, DPT   Acute Rehabilitation Department Pager #: [phone]   [name] 07/06/2020, 10:19 AM

## 2020-07-06 NOTE — Progress Notes (Signed)
Patient ID: Samuel [name], male   DOB: [birth year], 50 y.o.   MRN: [id number] Follow up - Trauma Critical Care  Patient Details:    Samuel [name] Samuel [name] is an 50 y.o. male.  Lines/tubes : External Urinary Catheter (Active)  Collection Container [name] drainage bag 07/05/20 2000  Suction (Verified suction is between 40-80 mmHg) N/A (Patient has condom catheter) 07/04/20 0800  Securement Method Tape 07/04/20 2000  Site Assessment Clean;Intact 07/05/20 2000  Intervention Male External Urinary Catheter Replaced 07/03/20 2000  Output (mL) 40 mL 07/06/20 0600    Microbiology/Sepsis markers: Results for orders placed or performed during the hospital encounter of 07/02/20  Resp Panel by RT-PCR (Flu A&B, Covid) Nasopharyngeal Swab     Status: None   Collection Time: 07/02/20 11:13 PM   Specimen: Nasopharyngeal Swab; Nasopharyngeal(NP) swabs in vial transport medium  Result Value Ref Range Status   SARS Coronavirus 2 by RT PCR NEGATIVE NEGATIVE Final    Comment: (NOTE) SARS-CoV-2 target nucleic acids are NOT DETECTED.  The SARS-CoV-2 RNA is generally detectable in upper respiratory specimens during the acute phase of infection. The lowest concentration of SARS-CoV-2 viral copies this assay can detect is 138 copies/mL. A negative result does not preclude SARS-Cov-2 infection and should not be used as the sole basis for treatment or other patient management decisions. A negative result may occur with  improper specimen collection/handling, submission of specimen other than nasopharyngeal swab, presence of viral mutation(s) within the areas targeted by this assay, and inadequate number of viral copies(<138 copies/mL). A negative result must be combined with clinical observations, patient history, and epidemiological information. The expected result is Negative.  Fact Sheet for Patients:  [name]  Fact Sheet for Healthcare  Providers:  [name]  This test is no t yet approved or cleared by the [name] FDA and  has been authorized for detection and/or diagnosis of SARS-CoV-2 by FDA under an Emergency Use Authorization (EUA). This EUA will remain  in effect (meaning this test can be used) for the duration of the COVID-19 declaration under Section 564(b)(1) of the Act, 21 U.S.C.section 360bbb-3(b)(1), unless the authorization is terminated  or revoked sooner.       Influenza A by PCR NEGATIVE NEGATIVE Final   Influenza B by PCR NEGATIVE NEGATIVE Final    Comment: (NOTE) The Xpert Xpress SARS-CoV-2/FLU/RSV plus assay is intended as an aid in the diagnosis of influenza from Nasopharyngeal swab specimens and should not be used as a sole basis for treatment. Nasal washings and aspirates are unacceptable for Xpert Xpress SARS-CoV-2/FLU/RSV testing.  Fact Sheet for Patients: [name]  Fact Sheet for Healthcare Providers: [name]  This test is not yet approved or cleared by the [name] FDA and has been authorized for detection and/or diagnosis of SARS-CoV-2 by FDA under an Emergency Use Authorization (EUA). This EUA will remain in effect (meaning this test can be used) for the duration of the COVID-19 declaration under Section 564(b)(1) of the Act, 21 U.S.C. section 360bbb-3(b)(1), unless the authorization is terminated or revoked.  Performed at [facility], [address], [place], [name] [address]   MRSA Next Gen by PCR, Nasal     Status: None   Collection Time: 07/03/20  1:20 AM  Result Value Ref Range Status   MRSA by PCR Next Gen NOT DETECTED NOT DETECTED Final    Comment: (NOTE) The GeneXpert MRSA Assay (FDA approved for NASAL specimens only), is one component of  a comprehensive MRSA colonization surveillance program. It is not intended to diagnose MRSA infection nor to  guide or monitor treatment for MRSA infections. Test performance is not FDA approved in patients less than 33 years old. Performed at [facility], [address] [place], [name] [address]     Anti-infectives:  Anti-infectives (From admission, onward)    None      Consults: Treatment Team:  Lisbeth Renshaw, MD    Studies:    Events:  Subjective:    Overnight Issues: Precedex  Objective:  Vital signs for last 24 hours: Temp:  [97.4 F (36.3 C)-98.3 F (36.8 C)] 97.4 F (36.3 C) (06/20 0800) Pulse Rate:  [59-86] 71 (06/20 0800) Resp:  [10-26] 14 (06/20 0800) BP: (111-170)/(87-119) 111/94 (06/20 0800) SpO2:  [100 %] 100 % (06/20 0800)  Hemodynamic parameters for last 24 hours:    Intake/Output from previous day: 06/19 0701 - 06/20 0700 In: 1052.1 [P.O.:620; I.V.:415.9; IV Piggyback:16.2] Out: 2150 [Urine:2150]  Intake/Output this shift: No intake/output data recorded.  Vent settings for last 24 hours:    Physical Exam:  General: no respiratory distress Neuro: arouses and F/C HEENT/Neck: no JVD Resp: clear to auscultation bilaterally CVS: RRR GI: soft, NT, TLSO Extremities: calves soft  Results for orders placed or performed during the hospital encounter of 07/02/20 (from the past 24 hour(s))  Basic metabolic panel     Status: Abnormal   Collection Time: 07/06/20  4:54 AM  Result Value Ref Range   Sodium 131 (L) 135 - 145 mmol/L   Potassium 4.0 3.5 - 5.1 mmol/L   Chloride 95 (L) 98 - 111 mmol/L   CO2 25 22 - 32 mmol/L   Glucose, Bld 114 (H) 70 - 99 mg/dL   BUN 8 6 - 20 mg/dL   Creatinine, Ser 8.25 (L) 0.61 - 1.24 mg/dL   Calcium 9.3 8.9 - 05.3 mg/dL   GFR, Estimated >97 >67 mL/min   Anion gap 11 5 - 15  Magnesium     Status: None   Collection Time: 07/06/20  4:54 AM  Result Value Ref Range   Magnesium 1.9 1.7 - 2.4 mg/dL  Phosphorus     Status: None   Collection Time: 07/06/20  4:54 AM  Result Value Ref Range   Phosphorus 3.9  2.5 - 4.6 mg/dL    Assessment & Plan: Present on Admission:  Fall    LOS: 4 days   Additional comments:I reviewed the patient's new clinical lab test results. Marland Kitchen Fall   SAH - NSGY c/s, Dr. 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 Conchita Paris, TLSO brace for now, assess mobility/pain control in brace, MRI T-spine 6/17 T7 endplate fracture - NSGY c/s, Dr. Conchita Paris, pain control, already in TLSO brace Cervicalgia - cont c-collar, MRI 6/17 noted Right first rib fx - EKG, pain control, pulm toilet EtOH abuse - TOC c/s, CIWA, beer with meals, thiamine/folate, precedex (wean as able) Right scapular fracture, R clavicle fx - ortho c/s, Dr. Magnus Ivan, sling for now, final recs pending Elevated LFTs - monitor Questionable syncope - echo, carotid duplex, EEG 6/17 FEN - reg diet, repeat lytes DVT - SCDs, LMWH Dispo - ICU for Precedex/CIWA management. PT/OT I spoke with Dr. Patric Dykes on the unit. Critical Care Total Time*: 40 Minutes  Violeta Gelinas, MD, MPH, FACS Trauma & General Surgery Use [email or web address] to contact on call provider  07/06/2020  *  Care during the described time interval was provided by me. I have reviewed this patient's available data, including medical history, events of note, physical examination and test results as part of my evaluation.

## 2020-07-06 NOTE — Progress Notes (Signed)
Pt transferred to [address] room 6. Bedside report given to Lauren RN.

## 2020-07-06 NOTE — Progress Notes (Signed)
Pt was agitated and restless trying hard to go out of bed even with body belt, asked interpreter thru [employer] to talk with the pt, pt can tell name, and complaining of  severe pain in chest and shoulder advised not to go out of bed and pt agreed

## 2020-07-06 NOTE — Progress Notes (Signed)
  NEUROSURGERY PROGRESS NOTE   No issues overnight.   EXAM:  BP (!) 111/94   Pulse 71   Temp (!) 97.4 F (36.3 C) (Axillary)   Resp 14   Ht 5' (1.524 m)   Wt 60.2 kg   SpO2 100%   BMI 25.92 kg/m   Awake, alert CN intact MAE good strength  IMPRESSION:  50 y.o. male s/p fall with T7 and T10 fractures, neurologically intact. Will plan on conservative treatment with TLSO brace when up  PLAN: - PT/OT today - Will get upright T-spine xrays - Okay for prophylactic Lovenox.   Lisbeth Renshaw, MD [facility]

## 2020-07-07 LAB — BASIC METABOLIC PANEL
Anion gap: 9 (ref 5–15)
BUN: 6 mg/dL (ref 6–20)
CO2: 26 mmol/L (ref 22–32)
Calcium: 9 mg/dL (ref 8.9–10.3)
Chloride: 97 mmol/L — ABNORMAL LOW (ref 98–111)
Creatinine, Ser: 0.62 mg/dL (ref 0.61–1.24)
GFR, Estimated: >60 mL/min (ref >60)
Glucose, Bld: 106 mg/dL — ABNORMAL HIGH (ref 70–99)
Potassium: 3.6 mmol/L (ref 3.5–5.1)
Sodium: 132 mmol/L — ABNORMAL LOW (ref 135–145)

## 2020-07-07 LAB — CBC
HCT: 28.9 % — ABNORMAL LOW (ref 39.0–52.0)
Hemoglobin: 9.9 g/dL — ABNORMAL LOW (ref 13.0–17.0)
MCH: 32.8 pg (ref 26.0–34.0)
MCHC: 34.3 g/dL (ref 30.0–36.0)
MCV: 95.7 fL (ref 80.0–100.0)
Platelets: 212 10*3/uL (ref 150–400)
RBC: 3.02 MIL/uL — ABNORMAL LOW (ref 4.22–5.81)
RDW: 15.2 % (ref 11.5–15.5)
WBC: 7.6 10*3/uL (ref 4.0–10.5)
nRBC: 0 % (ref 0.0–0.2)

## 2020-07-07 MED ORDER — QUETIAPINE FUMARATE 50 MG PO TABS
25.0000 mg | ORAL_TABLET | Freq: Two times a day (BID) | ORAL | Status: DC
  Administered 2020-07-07 – 2020-07-17 (×21): 25 mg via ORAL
  Filled 2020-07-07 (×22): qty 1

## 2020-07-07 NOTE — Progress Notes (Signed)
Patient finished lunch and was trying to get out of chair and taking back brace off.  Using interpreter, asked patient what he needed.  He stated, "My break is over, I'm going back to work."  Reminded patient that he is in the hospital and he does not need to work right now.  Also re-educated him on the need of the back brace when out of bed and that he needs to leave it on.  Patient requested to get back to bed.  Assisted with contact guard and back brace removed.  Patient situated in bed, stated pain was better and denies needs at this time.  Soft waist restraint and bed alarm on.  Will continue to monitor.

## 2020-07-07 NOTE — Progress Notes (Signed)
Orthopedic Tech Progress Note Patient Details:  Samuel [name] 25-Oct-1970 [id number]  Ortho Devices Type of Ortho Device: Arm sling Ortho Device/Splint Location: RUE Ortho Device/Splint Interventions: Ordered, Application, Adjustment   Post Interventions Patient Tolerated: Well Instructions Provided: Care of device  [name] 07/07/2020, 12:16 PM

## 2020-07-07 NOTE — Progress Notes (Addendum)
Physical Therapy Treatment Patient Details Name: Samuel [name] MRN: [id number] DOB: Nov 17, 1970 Today's Date: 07/07/2020    History of Present Illness 50 y.o. male found down outside his house - fall vs syncope.. Pt with T10, T 7 spinal fxs, cerivcalgia, R 1st, 3rd, 5th rib fx, R scapula and clavicle fx with questionable syncope, TBI with SAH; ETOH abuse - on CIWA.    PT Comments    Pt progressing towards his physical therapy goals, demonstrating improved activity tolerance this session. Readjusted cervical brace for optimal fit and totalA for donning TLSO sitting edge of bed; BP 140/99. Pt with no awareness, maintenance or recall of precautions throughout session. Ambulating x 50 feet with min assist. Demonstrates dynamic instability and left drift. After positioning in the chair, pt later attempting to take off his back brace, telling RN, "My break is over. I have to go back to work." Pt with decreased cognition, balance deficits, decreased awareness of safety/precautions, and pain. Updated d/c plan to CIR in light of complexity of deficits in order to maximize functional independence prior to d/c home.   [employer] interpreter Maureen Ralphs utilized for this session [phone]    Follow Up Recommendations  CIR     Equipment Recommendations  None recommended by PT    Recommendations for Other Services       Precautions / Restrictions Precautions Precautions: Fall;Cervical;Back;Shoulder Type of Shoulder Precautions: sling and NWB at this time Shoulder Interventions: Shoulder sling/immobilizer Precaution Comments: Posey Required Braces or Orthoses: Cervical Brace;Spinal Brace;Sling Cervical Brace: Hard collar;At all times Spinal Brace: Thoracolumbosacral orthotic;Applied in sitting position Splint/Cast - Date Prophylactic Dressing Applied (if applicable): 07/03/20 Restrictions Weight Bearing Restrictions: Yes RUE Weight Bearing: Non weight bearing    Mobility  Bed  Mobility Overal bed mobility: Needs Assistance Bed Mobility: Supine to Sit Rolling: Min guard Sidelying to sit: Mod assist       General bed mobility comments: Pt progressing into long sitting position before PT/OT could provide education regarding log roll technique. Min guard to progress to edge of bed    Transfers Overall transfer level: Needs assistance Equipment used: None Transfers: Sit to/from Stand Sit to Stand: Min assist         General transfer comment: MinA to rise and steady from edge of bed and toilet  Ambulation/Gait Ambulation/Gait assistance: Min assist;+2 safety/equipment Gait Distance (Feet): 60 Feet Assistive device: None Gait Pattern/deviations: Step-through pattern;Decreased stride length;Drifts right/left Gait velocity: decreased   General Gait Details: Pt with dynamic instability, requiring minA, drifting towards left consistently   Stairs             Wheelchair Mobility    Modified Rankin (Stroke Patients Only)       Balance Overall balance assessment: Needs assistance Sitting-balance support: Feet supported Sitting balance-Leahy Scale: Fair Sitting balance - Comments: [email or web address] for support Postural control: Left lateral lean Standing balance support: During functional activity;No upper extremity supported Standing balance-Leahy Scale: Fair Standing balance comment: Pt with L lateral lean                            Cognition Arousal/Alertness: Awake/alert Behavior During Therapy: Flat affect Overall Cognitive Status: Impaired/Different from baseline Area of Impairment: Orientation;Attention;Memory;Following commands;Safety/judgement;Awareness;Problem solving                 Orientation Level: Disoriented to;Situation;Place Current Attention Level: Sustained Memory: Decreased recall of precautions;Decreased short-term memory Following Commands: Follows one step commands with  increased time;Follows multi-step  commands inconsistently Safety/Judgement: Decreased awareness of safety;Decreased awareness of deficits Awareness: Emergent Problem Solving: Slow processing;Decreased initiation;Requires verbal cues General Comments: Pt more alert today and following 1 step commands with minimal carry over with back precautions and leaning over sink to perfrom grooming tasks with out cues to maintain precautions after education. When asked what month and year it is, pt stating, "I don't have my phone so I'm not sure." Able to correctly state month and year with increased time. Pt later telling RN, "I'm on break. It's time to go back to work."      Exercises      General Comments        Pertinent Vitals/Pain Pain Assessment: Faces Faces Pain Scale: Hurts little more Pain Location: low back Pain Descriptors / Indicators: Discomfort;Sore Pain Intervention(s): Monitored during session    Home Living                      Prior Function            PT Goals (current goals can now be found in the care plan section) Acute Rehab PT Goals Patient Stated Goal: did not state Potential to Achieve Goals: Good Progress towards PT goals: Progressing toward goals    Frequency    Min 4X/week      PT Plan Discharge plan needs to be updated    Co-evaluation PT/OT/SLP Co-Evaluation/Treatment: Yes Reason for Co-Treatment: Complexity of the patient's impairments (multi-system involvement);Necessary to address cognition/behavior during functional activity;To address functional/ADL transfers;For patient/therapist safety   OT goals addressed during session: ADL's and self-care;Strengthening/ROM      AM-PAC PT "6 Clicks" Mobility   Outcome Measure  Help needed turning from your back to your side while in a flat bed without using bedrails?: A Little Help needed moving from lying on your back to sitting on the side of a flat bed without using bedrails?: A Little Help needed moving to and from a bed  to a chair (including a wheelchair)?: A Little Help needed standing up from a chair using your arms (e.g., wheelchair or bedside chair)?: A Little Help needed to walk in hospital room?: A Little Help needed climbing 3-5 steps with a railing? : A Lot 6 Click Score: 17    End of Session Equipment Utilized During Treatment: Gait belt;Back brace;Cervical collar Activity Tolerance: Patient tolerated treatment well Patient left: in chair;with call [name]/phone within reach;with chair alarm set;with restraints reapplied Nurse Communication: Mobility status PT Visit Diagnosis: Other abnormalities of gait and mobility (R26.89);Pain Pain - Right/Left: Right Pain - part of body: Shoulder;Arm (back)     Time: 5400-8676 PT Time Calculation (min) (ACUTE ONLY): 41 min  Charges:  $Therapeutic Activity: 23-37 mins                     Lillia Pauls, PT, DPT Acute Rehabilitation Services Pager [phone] Office [phone]    Norval Morton 07/07/2020, 3:12 PM

## 2020-07-07 NOTE — Plan of Care (Signed)
  Problem: Education: Goal: Knowledge of General Education information will improve Description: Including pain rating scale, medication(s)/side effects and non-pharmacologic comfort measures Outcome: Progressing   Problem: Health Behavior/Discharge Planning: Goal: Ability to manage health-related needs will improve Outcome: Progressing   Problem: Clinical Measurements: Goal: Ability to maintain clinical measurements within normal limits will improve Outcome: Progressing Goal: Will remain free from infection Outcome: Progressing Goal: Diagnostic test results will improve Outcome: Progressing Goal: Respiratory complications will improve Outcome: Progressing Goal: Cardiovascular complication will be avoided Outcome: Progressing   Problem: Activity: Goal: Risk for activity intolerance will decrease Outcome: Progressing   Problem: Nutrition: Goal: Adequate nutrition will be maintained Outcome: Progressing   Problem: Coping: Goal: Level of anxiety will decrease Outcome: Progressing   Problem: Elimination: Goal: Will not experience complications related to bowel motility Outcome: Progressing Goal: Will not experience complications related to urinary retention Outcome: Progressing   Problem: Pain Managment: Goal: General experience of comfort will improve Outcome: Progressing   Problem: Safety: Goal: Ability to remain free from injury will improve Outcome: Progressing   Problem: Skin Integrity: Goal: Risk for impaired skin integrity will decrease Outcome: Progressing   Problem: Physical Regulation: Goal: Complications related to the disease process, condition or treatment will be avoided or minimized Outcome: Progressing   Problem: Safety: Goal: Ability to remain free from injury will improve Outcome: Progressing   Problem: Safety: Goal: Non-violent Restraint(s) Outcome: Progressing

## 2020-07-07 NOTE — Progress Notes (Signed)
Patient ID: Samuel [name], male   DOB: 1971/01/14, 50 y.o.   MRN: [id number] Follow up - Trauma Critical Care  Patient Details:    Samuel [name] Samuel [name] is an 50 y.o. male.  Lines/tubes : External Urinary Catheter (Active)  Collection Container [name] drainage bag 07/05/20 2000  Suction (Verified suction is between 40-80 mmHg) N/A (Patient has condom catheter) 07/04/20 0800  Securement Method Tape 07/04/20 2000  Site Assessment Clean;Intact 07/05/20 2000  Intervention Male External Urinary Catheter Replaced 07/03/20 2000  Output (mL) 40 mL 07/06/20 0600    Microbiology/Sepsis markers: Results for orders placed or performed during the hospital encounter of 07/02/20  Resp Panel by RT-PCR (Flu A&B, Covid) Nasopharyngeal Swab     Status: None   Collection Time: 07/02/20 11:13 PM   Specimen: Nasopharyngeal Swab; Nasopharyngeal(NP) swabs in vial transport medium  Result Value Ref Range Status   SARS Coronavirus 2 by RT PCR NEGATIVE NEGATIVE Final    Comment: (NOTE) SARS-CoV-2 target nucleic acids are NOT DETECTED.  The SARS-CoV-2 RNA is generally detectable in upper respiratory specimens during the acute phase of infection. The lowest concentration of SARS-CoV-2 viral copies this assay can detect is 138 copies/mL. A negative result does not preclude SARS-Cov-2 infection and should not be used as the sole basis for treatment or other patient management decisions. A negative result may occur with  improper specimen collection/handling, submission of specimen other than nasopharyngeal swab, presence of viral mutation(s) within the areas targeted by this assay, and inadequate number of viral copies(<138 copies/mL). A negative result must be combined with clinical observations, patient history, and epidemiological information. The expected result is Negative.  Fact Sheet for Patients:  [name]  Fact Sheet for Healthcare  Providers:  [name]  This test is no t yet approved or cleared by the [name] FDA and  has been authorized for detection and/or diagnosis of SARS-CoV-2 by FDA under an Emergency Use Authorization (EUA). This EUA will remain  in effect (meaning this test can be used) for the duration of the COVID-19 declaration under Section 564(b)(1) of the Act, 21 U.S.C.section 360bbb-3(b)(1), unless the authorization is terminated  or revoked sooner.       Influenza A by PCR NEGATIVE NEGATIVE Final   Influenza B by PCR NEGATIVE NEGATIVE Final    Comment: (NOTE) The Xpert Xpress SARS-CoV-2/FLU/RSV plus assay is intended as an aid in the diagnosis of influenza from Nasopharyngeal swab specimens and should not be used as a sole basis for treatment. Nasal washings and aspirates are unacceptable for Xpert Xpress SARS-CoV-2/FLU/RSV testing.  Fact Sheet for Patients: [name]  Fact Sheet for Healthcare Providers: [name]  This test is not yet approved or cleared by the [name] FDA and has been authorized for detection and/or diagnosis of SARS-CoV-2 by FDA under an Emergency Use Authorization (EUA). This EUA will remain in effect (meaning this test can be used) for the duration of the COVID-19 declaration under Section 564(b)(1) of the Act, 21 U.S.C. section 360bbb-3(b)(1), unless the authorization is terminated or revoked.  Performed at [name], [address] [name], [name]   MRSA Next Gen by PCR, Nasal     Status: None   Collection Time: 07/03/20  1:20 AM  Result Value Ref Range Status   MRSA by PCR Next Gen NOT DETECTED NOT DETECTED Final    Comment: (NOTE) The GeneXpert MRSA Assay (FDA approved for NASAL specimens only), is one component of  a comprehensive MRSA colonization surveillance program. It is not intended to diagnose MRSA infection nor to  guide or monitor treatment for MRSA infections. Test performance is not FDA approved in patients less than 68 years old. Performed at [name], [address] [name], [name]     Anti-infectives:  Anti-infectives (From admission, onward)    None      Consults: Treatment Team:  Lisbeth Renshaw, MD    Studies:    Events:  Subjective:    Overnight Issues: sitting up, wrist restraints and c-collar removed by patient. C-collar replaced by me. Left leg off of the bed. Ate 100% of breakfast. Reports mild R chest wall pain. Denies abd pain.   Objective:  Vital signs for last 24 hours: Temp:  [97.5 F (36.4 C)-99.4 F (37.4 C)] 98.4 F (36.9 C) (06/21 0738) Pulse Rate:  [71-123] 113 (06/21 0900) Resp:  [11-25] 20 (06/21 0738) BP: (69-160)/(58-98) 139/87 (06/21 0900) SpO2:  [90 %-100 %] 98 % (06/21 0738)  Hemodynamic parameters for last 24 hours:    Intake/Output from previous day: 06/20 0701 - 06/21 0700 In: 1517.4 [P.O.:1440; I.V.:77.4] Out: 1150 [Urine:1150]  Intake/Output this shift: No intake/output data recorded.  Vent settings for last 24 hours:    Physical Exam:  General: no respiratory distress Neuro: arouses and F/C HEENT/Neck: no JVD Resp: clear to auscultation bilaterally CVS: RRR GI: soft, NT, TLSO Extremities: calves soft Psych: oriented to self only.   Results for orders placed or performed during the hospital encounter of 07/02/20 (from the past 24 hour(s))  Basic metabolic panel     Status: Abnormal   Collection Time: 07/07/20  2:34 AM  Result Value Ref Range   Sodium 132 (L) 135 - 145 mmol/L   Potassium 3.6 3.5 - 5.1 mmol/L   Chloride 97 (L) 98 - 111 mmol/L   CO2 26 22 - 32 mmol/L   Glucose, Bld 106 (H) 70 - 99 mg/dL   BUN 6 6 - 20 mg/dL   Creatinine, Ser 6.57 0.61 - 1.24 mg/dL   Calcium 9.0 8.9 - 84.6 mg/dL   GFR, Estimated >96 >29 mL/min   Anion gap 9 5 - 15  CBC     Status: Abnormal   Collection Time:  07/07/20  2:34 AM  Result Value Ref Range   WBC 7.6 4.0 - 10.5 K/uL   RBC 3.02 (L) 4.22 - 5.81 MIL/uL   Hemoglobin 9.9 (L) 13.0 - 17.0 g/dL   HCT 52.8 (L) 41.3 - 24.4 %   MCV 95.7 80.0 - 100.0 fL   MCH 32.8 26.0 - 34.0 pg   MCHC 34.3 30.0 - 36.0 g/dL   RDW 01.0 27.2 - 53.6 %   Platelets 212 150 - 400 K/uL   nRBC 0.0 0.0 - 0.2 %    Assessment & Plan: Present on Admission:  Fall    LOS: 5 days   Additional comments:I reviewed the patient's new clinical lab test results. Marland Kitchen Fall   SAH - NSGY c/s, Dr. 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 Conchita Paris, TLSO brace for now, assess mobility/pain control in brace, MRI T-spine 6/17 T7 endplate fracture - NSGY c/s, Dr. Conchita Paris, pain control, already in TLSO brace Cervicalgia - cont c-collar, MRI 6/17 noted; Right first rib fx - EKG, pain control, pulm toilet EtOH abuse - TOC c/s, CIWA, beer with meals, thiamine/folate, off of  precedex and moved to progressive 6/20. Start BID seroquel 25mg .  Right scapular fracture, R clavicle fx - ortho c/s, Dr. , sling for now, final recs pending Elevated LFTs - monitor Questionable syncope - echo, carotid duplex, EEG 6/17 FEN - reg diet, repeat lytes DVT - SCDs, LMWH Dispo - Progressive care, CIWA - start seroquel for agitation, continue soft restraints - wean as able. Therapies.   7/17, PA-C  Trauma & General Surgery Use [email or web address] to contact on call provider  07/07/2020

## 2020-07-07 NOTE — Progress Notes (Signed)
Pt kept on moving trying to go out of bed, and removing of blanket, cloths ecg lead in spite of pt is on restraint, pt was confused as interpreted by spanish speaking staff, given with ativan as ordered, secured restraint and put back cloth, blanket and lead

## 2020-07-07 NOTE — Progress Notes (Signed)
Initial assessment done with interpreter, pt can talk with interpreter clearly, not in distress, agreed not to remove cervical collar, cloth, ecg lead

## 2020-07-07 NOTE — Progress Notes (Signed)
  NEUROSURGERY PROGRESS NOTE   No issues overnight. Pt reports a little back pain, better than yesterday. Has ambulated to bathroom with nursing, currently working with PT/OT.  EXAM:  BP 139/87   Pulse (!) 113   Temp 98.4 F (36.9 C)   Resp 20   Ht 5' (1.524 m)   Wt 60.2 kg   SpO2 98%   BMI 25.92 kg/m   Awake, alert, oriented  Speech fluent, appropriate  CN grossly intact  Good strength BLE  IMPRESSION:  50 y.o. male s/p fall with T7 and T10 fractures. Thoracic Xrays yesterday are reassuring without evidence of progressive deformity. Should be able to treat conservatively.  PLAN: - Cont PT/OT, brace when up - Can f/u in office in 2 weeks for continued radiographic and clinical f/u.   Lisbeth Renshaw, MD [facility]

## 2020-07-07 NOTE — Progress Notes (Signed)
Inpatient Rehab Admissions Coordinator Note:   Per updated PT recommendations, pt was screened for CIR candidacy by Estill Dooms, PT, DPT.  At this time pt does appear to have a functional decline, however he appears to be homeless without caregivers. If pt has a potential disposition, would recommend CIR consult.  Please contact me with questions.   Estill Dooms, PT, DPT [phone] 07/07/20 3:56 PM

## 2020-07-07 NOTE — Progress Notes (Signed)
Using interpreter, patient informed me that he is "feeling a little better and I'm more awake" and states that he can clean up around here, do some outside work, or do whatever we need him to do.  Reminded patient via interpreter that he is in the hospital and has some fractures.  Informed him that he does not need to work right now and that all he needs to do is rest and recover.  Patient stated, "Oh yeah it does hurt if I try to lift anything."  Reiterated plan of care with patient via interpreter and explained medications.  Soft waist restraint on and remains necessary for patient safety.  Bed exit alarm also on.  Patient denies further needs at present via interpreter.  Will continue to monitor.

## 2020-07-07 NOTE — Progress Notes (Signed)
Occupational Therapy Treatment Patient Details Name: Samuel [name] MRN: [id number] DOB: [birth year] Today's Date: 07/07/2020    History of present illness 50 y.o. male found down outside his house - fall vs syncope.. Pt with T10, T 7 spinal fxs, cerivcalgia, R 1st, 3rd, 5th rib fx, R scapula and clavicle fx with questionable syncope, TBI with SAH; ETOH abuse - on CIWA.   OT comments  Pt more alert today and following 1 step commands with minimal carry over with back precautions and leaning over sink to perfrom grooming tasks with cues to maintain precautions after education. Pt reports that he has a place to stay, but in the chart he is "from the streets." Pt education on back precautions, decreased ability to care for self and increased pain in low back. Pt minA for mobility, but fatigues quickly. Pt requiring multimodal cues to maintain back precautions and wanting to move posey belt and take off cervical brace.  Pt requiring cues to redirect attention. Pt set-upA to maxA for ADL.  BP 140/99 with exertion. Pt would greatly benefit from continued OT skilled services. OT following acutely.   Follow Up Recommendations  CIR    Equipment Recommendations  Tub/shower seat    Recommendations for Other Services      Precautions / Restrictions Precautions Precautions: Fall;Cervical;Back;Shoulder Type of Shoulder Precautions: sling and NWB at this time Shoulder Interventions: Shoulder sling/immobilizer Precaution Booklet Issued: No Precaution Comments: Posey Required Braces or Orthoses: Cervical Brace;Spinal Brace;Sling Cervical Brace: Hard collar;At all times Spinal Brace: Thoracolumbosacral orthotic;Applied in sitting position Restrictions Weight Bearing Restrictions: Yes RUE Weight Bearing: Non weight bearing       Mobility Bed Mobility Overal bed mobility: Needs Assistance Bed Mobility: Supine to Sit Rolling: Min guard Sidelying to sit: Mod assist     Sit to  sidelying: Max assist;+2 for physical assistance General bed mobility comments: Pt progressing into long sitting position before PT/OT could provide education regarding log roll technique. Min guard to progress to edge of bed    Transfers Overall transfer level: Needs assistance Equipment used: None Transfers: Sit to/from Stand Sit to Stand: Min assist   Squat pivot transfers: Min assist     General transfer comment: MinA to rise and steady from edge of bed and toilet    Balance Overall balance assessment: Needs assistance Sitting-balance support: Feet supported Sitting balance-Leahy Scale: Fair Sitting balance - Comments: [email or web address] for support Postural control: Left lateral lean Standing balance support: During functional activity;No upper extremity supported Standing balance-Leahy Scale: Fair Standing balance comment: Pt with L lateral lean                           ADL either performed or assessed with clinical judgement   ADL Overall ADL's : Needs assistance/impaired Eating/Feeding: Set up;Sitting   Grooming: Minimal assistance;Standing;Cueing for safety;Cueing for sequencing Grooming Details (indicate cue type and reason): cues to maintain back/neck precautions as pt leaning over sink for tasks Upper Body Bathing: Moderate assistance;Sitting   Lower Body Bathing: Moderate assistance;Sit to/from stand   Upper Body Dressing : Maximal assistance   Lower Body Dressing: Maximal assistance   Toilet Transfer: Minimal assistance;BSC;Ambulation Toilet Transfer Details (indicate cue type and reason): cues for hand placement Toileting- Clothing Manipulation and Hygiene: Total assistance (foley; pt able to verbalize need to urinate; feeling like he needed to have a BM)       Functional mobility during ADLs: Minimal assistance;+2 for safety/equipment;Cueing  for sequencing;Cueing for safety General ADL Comments: Pt education on back precautions, decreased ability to  care for self and increased pain in low back. Pt requiring multimodal cues to maintain back precautions and wanting to move posey belt and take off cervical brace.     Vision   Vision Assessment?: No apparent visual deficits Additional Comments: reading clock;name and numbers for phone call   Perception     Praxis      Cognition Arousal/Alertness: Awake/alert Behavior During Therapy: Flat affect Overall Cognitive Status: Impaired/Different from baseline Area of Impairment: Orientation;Attention;Memory;Following commands;Safety/judgement;Awareness;Problem solving                 Orientation Level: Disoriented to;Situation;Place Current Attention Level: Sustained Memory: Decreased recall of precautions;Decreased short-term memory Following Commands: Follows one step commands with increased time;Follows multi-step commands inconsistently Safety/Judgement: Decreased awareness of safety;Decreased awareness of deficits Awareness: Emergent Problem Solving: Slow processing;Decreased initiation;Requires verbal cues General Comments: Pt more alert and following commands. Pt able to tell time, reason for hospitalization and minimal irritation attemtpting to readjust C collar. Pt more alert today and following 1 step commands with minimal carry over with back precautions and leaning over sink to perfrom grooming tasks with out cues to maintain precautions after education.        Exercises     Shoulder Instructions       General Comments BP 140/99 with exertion    Pertinent Vitals/ Pain       Pain Assessment: Faces Faces Pain Scale: Hurts little more Pain Location: low back Pain Descriptors / Indicators: Discomfort;Sore Pain Intervention(s): Monitored during session  Home Living                                          Prior Functioning/Environment              Frequency  Min 2X/week        Progress Toward Goals  OT Goals(current goals can now  be found in the care plan section)  Progress towards OT goals: Progressing toward goals  Acute Rehab OT Goals Patient Stated Goal: did not state OT Goal Formulation: With patient Potential to Achieve Goals: Good ADL Goals Pt Will Perform Grooming: with supervision;standing Pt Will Perform Upper Body Bathing: with min assist;sitting Pt Will Perform Lower Body Bathing: with supervision;with adaptive equipment;sit to/from stand Pt Will Perform Upper Body Dressing: with modified independence;sitting Pt Will Perform Lower Body Dressing: with modified independence;sit to/from stand;with adaptive equipment Pt Will Transfer to Toilet: with modified independence;ambulating Additional ADL Goal #1: Pt will independnelty verbalize understanding of precautions Additional ADL Goal #2: Pt will manage back brace and sling with min A  Plan Discharge plan needs to be updated    Co-evaluation    PT/OT/SLP Co-Evaluation/Treatment: Yes Reason for Co-Treatment: Complexity of the patient's impairments (multi-system involvement);For patient/therapist safety;To address functional/ADL transfers;Necessary to address cognition/behavior during functional activity   OT goals addressed during session: ADL's and self-care      AM-PAC OT "6 Clicks" Daily Activity     Outcome Measure   Help from another person eating meals?: A Lot Help from another person taking care of personal grooming?: A Lot Help from another person toileting, which includes using toliet, bedpan, or urinal?: A Lot Help from another person bathing (including washing, rinsing, drying)?: A Lot Help from another person to put on and taking off regular upper  body clothing?: A Lot Help from another person to put on and taking off regular lower body clothing?: A Lot 6 Click Score: 12    End of Session Equipment Utilized During Treatment: Cervical collar;Back brace;Other (comment) (sling)  OT Visit Diagnosis: Unsteadiness on feet (R26.81);Other  abnormalities of gait and mobility (R26.89);Muscle weakness (generalized) (M62.81);History of falling (Z91.81);Other symptoms and signs involving cognitive function;Dizziness and giddiness (R42);Pain Pain - Right/Left: Right Pain - part of body: Arm;Shoulder (back)   Activity Tolerance Patient tolerated treatment well   Patient Left in bed;with call bell/phone within reach;with bed alarm set;with SCD's reapplied;with nursing/sitter in room   Nurse Communication Mobility status;Precautions;Weight bearing status        Time: 1100-1145 OT Time Calculation (min): 45 min  Charges: OT General Charges $OT Visit: 1 Visit OT Treatments $Self Care/Home Management : 8-22 mins  Samuel [name], OTR/L Acute Rehabilitation Services Pager: [phone] Office: [phone]    Samuel [name] 07/07/2020, 8:50 PM

## 2020-07-07 NOTE — Progress Notes (Signed)
Seen pt lying on lateral position on bed, cloths, electrodes, and cervical collar and mitts bilateral was removed, reposition and put back everything in place, secured restraints and mitts, given with ativan IV

## 2020-07-07 NOTE — Progress Notes (Signed)
Seen pt on bed removed again cloths and condo catheter, with urine on bed, kept on talking spanish, contacted interpreter in [employer], talked with the pt, still pt can tell his name, the year he was born but he did not know exactly when, he do not  know why he was here but he told he was busy doing his work and he did not know what happened next, pt complain of severe pain in chest and shoulder right to the back, pain scale=10, no other complain was made, end of discussion with interpreter

## 2020-07-08 LAB — CBC
HCT: 29.8 % — ABNORMAL LOW (ref 39.0–52.0)
Hemoglobin: 10.1 g/dL — ABNORMAL LOW (ref 13.0–17.0)
MCH: 33.3 pg (ref 26.0–34.0)
MCHC: 33.9 g/dL (ref 30.0–36.0)
MCV: 98.3 fL (ref 80.0–100.0)
Platelets: 227 10*3/uL (ref 150–400)
RBC: 3.03 MIL/uL — ABNORMAL LOW (ref 4.22–5.81)
RDW: 15.8 % — ABNORMAL HIGH (ref 11.5–15.5)
WBC: 7.6 10*3/uL (ref 4.0–10.5)
nRBC: 0 % (ref 0.0–0.2)

## 2020-07-08 LAB — BASIC METABOLIC PANEL
Anion gap: 9 (ref 5–15)
BUN: 7 mg/dL (ref 6–20)
CO2: 27 mmol/L (ref 22–32)
Calcium: 9.3 mg/dL (ref 8.9–10.3)
Chloride: 95 mmol/L — ABNORMAL LOW (ref 98–111)
Creatinine, Ser: 0.6 mg/dL — ABNORMAL LOW (ref 0.61–1.24)
GFR, Estimated: >60 mL/min (ref >60)
Glucose, Bld: 101 mg/dL — ABNORMAL HIGH (ref 70–99)
Potassium: 3.7 mmol/L (ref 3.5–5.1)
Sodium: 131 mmol/L — ABNORMAL LOW (ref 135–145)

## 2020-07-08 NOTE — Progress Notes (Addendum)
Physical Therapy Treatment Patient Details Name: Celvin Taney MRN: [id number] DOB: 1970/06/22 Today's Date: 07/08/2020    History of Present Illness 50 y.o. male found down outside his house - fall vs syncope.. Pt with T10, T 7 spinal fxs, cerivcalgia, R 1st, 3rd, 5th rib fx, R scapula and clavicle fx with questionable syncope, TBI with SAH; ETOH abuse - on CIWA.    PT Comments    Pt progressing towards his mobility goals, although cognition remains very impaired. Pt oriented to self only; states year is 71 and his age is "59 something." Pt has no awareness or  carryover of injuries, precautions, or brace use. Pt requiring min assist for functional mobility. Ambulating hallway distances handheld assist and demonstrating dynamic instability. Pt presents as a high fall risk based on decreased gait speed, safety awareness, and balance deficits. Recommend post acute rehab to address.    Follow Up Recommendations  CIR;Supervision/Assistance - 24 hour     Equipment Recommendations  None recommended by PT    Recommendations for Other Services       Precautions / Restrictions Precautions Precautions: Fall;Cervical;Back;Shoulder Type of Shoulder Precautions: sling and NWB at this time Shoulder Interventions: Shoulder sling/immobilizer Precaution Comments: Posey Required Braces or Orthoses: Cervical Brace;Spinal Brace;Sling Cervical Brace: Hard collar;At all times Spinal Brace: Thoracolumbosacral orthotic;Applied in sitting position Restrictions Weight Bearing Restrictions: Yes RUE Weight Bearing: Non weight bearing    Mobility  Bed Mobility               General bed mobility comments: OOB in chair    Transfers Overall transfer level: Needs assistance Equipment used: None Transfers: Sit to/from Stand Sit to Stand: Min assist         General transfer comment: MinA to rise and steady from chair and toilet  Ambulation/Gait Ambulation/Gait  assistance: Min assist Gait Distance (Feet): 250 Feet Assistive device: None Gait Pattern/deviations: Step-through pattern;Decreased stride length;Drifts right/left Gait velocity: decreased   General Gait Details: Pt with dynamic instability, multiple episodes of loss of balance requiring minA to correct. RLE circumduction noted   Stairs             Wheelchair Mobility    Modified Rankin (Stroke Patients Only)       Balance Overall balance assessment: Needs assistance Sitting-balance support: Feet supported Sitting balance-Leahy Scale: Fair     Standing balance support: During functional activity;No upper extremity supported Standing balance-Leahy Scale: Fair                              Cognition Arousal/Alertness: Awake/alert Behavior During Therapy: Flat affect Overall Cognitive Status: Impaired/Different from baseline Area of Impairment: Orientation;Attention;Memory;Following commands;Safety/judgement;Awareness;Problem solving                 Orientation Level: Disoriented to;Situation;Place Current Attention Level: Sustained Memory: Decreased recall of precautions;Decreased short-term memory Following Commands: Follows one step commands with increased time;Follows multi-step commands inconsistently Safety/Judgement: Decreased awareness of safety;Decreased awareness of deficits Awareness: Emergent Problem Solving: Slow processing;Decreased initiation;Requires verbal cues General Comments: Pt oriented to self and month. Pt states year is 50. Follows 1 step commands consistently, multi step commands inconsistently. Can way find in hallway with mod cues. Pt with no recall or carryover of recall or precautions.      Exercises      General Comments        Pertinent Vitals/Pain Pain Assessment: Faces Faces Pain Scale: Hurts little  more Pain Location: chest with inhalation, R shoulder Pain Descriptors / Indicators: Discomfort;Sore Pain  Intervention(s): Monitored during session    Home Living                      Prior Function            PT Goals (current goals can now be found in the care plan section) Acute Rehab PT Goals Patient Stated Goal: did not state Potential to Achieve Goals: Good Progress towards PT goals: Progressing toward goals    Frequency    Min 4X/week      PT Plan Current plan remains appropriate    Co-evaluation              AM-PAC PT "6 Clicks" Mobility   Outcome Measure  Help needed turning from your back to your side while in a flat bed without using bedrails?: A Little Help needed moving from lying on your back to sitting on the side of a flat bed without using bedrails?: A Little Help needed moving to and from a bed to a chair (including a wheelchair)?: A Little Help needed standing up from a chair using your arms (e.g., wheelchair or bedside chair)?: A Little Help needed to walk in hospital room?: A Little Help needed climbing 3-5 steps with a railing? : A Lot 6 Click Score: 17    End of Session Equipment Utilized During Treatment: Gait belt;Back brace;Cervical collar Activity Tolerance: Patient tolerated treatment well Patient left: in chair;with call bell/phone within reach;with chair alarm set;with restraints reapplied Nurse Communication: Mobility status PT Visit Diagnosis: Other abnormalities of gait and mobility (R26.89);Pain Pain - Right/Left: Right Pain - part of body: Shoulder;Arm (back)     Time: 7371-0626 PT Time Calculation (min) (ACUTE ONLY): 20 min  Charges:  $Therapeutic Activity: 8-22 mins                     Lillia Pauls, PT, DPT Acute Rehabilitation Services Pager [phone] Office [phone]    Norval Morton 07/08/2020, 12:07 PM

## 2020-07-08 NOTE — Progress Notes (Addendum)
Patient ID: Samuel [name], male   DOB: 1971/01/14, 50 y.o.   MRN: [id number] Follow up - Trauma Critical Care  Patient Details:    Samuel [name] is an 50 y.o. male.  Lines/tubes : External Urinary Catheter (Active)  Collection Container [name] drainage bag 07/05/20 2000  Suction (Verified suction is between 40-80 mmHg) N/A (Patient has condom catheter) 07/04/20 0800  Securement Method Tape 07/04/20 2000  Site Assessment Clean;Intact 07/05/20 2000  Intervention Male External Urinary Catheter Replaced 07/03/20 2000  Output (mL) 40 mL 07/06/20 0600    Microbiology/Sepsis markers: Results for orders placed or performed during the hospital encounter of 07/02/20  Resp Panel by RT-PCR (Flu A&B, Covid) Nasopharyngeal Swab     Status: None   Collection Time: 07/02/20 11:13 PM   Specimen: Nasopharyngeal Swab; Nasopharyngeal(NP) swabs in vial transport medium  Result Value Ref Range Status   SARS Coronavirus 2 by RT PCR NEGATIVE NEGATIVE Final    Comment: (NOTE) SARS-CoV-2 target nucleic acids are NOT DETECTED.  The SARS-CoV-2 RNA is generally detectable in upper respiratory specimens during the acute phase of infection. The lowest concentration of SARS-CoV-2 viral copies this assay can detect is 138 copies/mL. A negative result does not preclude SARS-Cov-2 infection and should not be used as the sole basis for treatment or other patient management decisions. A negative result may occur with  improper specimen collection/handling, submission of specimen other than nasopharyngeal swab, presence of viral mutation(s) within the areas targeted by this assay, and inadequate number of viral copies(<138 copies/mL). A negative result must be combined with clinical observations, patient history, and epidemiological information. The expected result is Negative.  Fact Sheet for Patients:  [name]  Fact Sheet for Healthcare  Providers:  [name]  This test is no t yet approved or cleared by the [name] FDA and  has been authorized for detection and/or diagnosis of SARS-CoV-2 by FDA under an Emergency Use Authorization (EUA). This EUA will remain  in effect (meaning this test can be used) for the duration of the COVID-19 declaration under Section 564(b)(1) of the Act, 21 U.S.C.section 360bbb-3(b)(1), unless the authorization is terminated  or revoked sooner.       Influenza A by PCR NEGATIVE NEGATIVE Final   Influenza B by PCR NEGATIVE NEGATIVE Final    Comment: (NOTE) The Xpert Xpress SARS-CoV-2/FLU/RSV plus assay is intended as an aid in the diagnosis of influenza from Nasopharyngeal swab specimens and should not be used as a sole basis for treatment. Nasal washings and aspirates are unacceptable for Xpert Xpress SARS-CoV-2/FLU/RSV testing.  Fact Sheet for Patients: [name]  Fact Sheet for Healthcare Providers: [name]  This test is not yet approved or cleared by the [name] FDA and has been authorized for detection and/or diagnosis of SARS-CoV-2 by FDA under an Emergency Use Authorization (EUA). This EUA will remain in effect (meaning this test can be used) for the duration of the COVID-19 declaration under Section 564(b)(1) of the Act, 21 U.S.C. section 360bbb-3(b)(1), unless the authorization is terminated or revoked.  Performed at [name], [address] [name], [name]   MRSA Next Gen by PCR, Nasal     Status: None   Collection Time: 07/03/20  1:20 AM  Result Value Ref Range Status   MRSA by PCR Next Gen NOT DETECTED NOT DETECTED Final    Comment: (NOTE) The GeneXpert MRSA Assay (FDA approved for NASAL specimens only), is one component of  a comprehensive MRSA colonization surveillance program. It is not intended to diagnose MRSA infection nor to  guide or monitor treatment for MRSA infections. Test performance is not FDA approved in patients less than 67 years old. Performed at [facility], [address], [place], [name] [address]     Anti-infectives:  Anti-infectives (From admission, onward)    None      Consults:     Studies:    Events:  Subjective:    Overnight Issues: sitting up getting a bath. C-collar is on. Patient answering questions and following commands appropriately. Denies pain.  Objective:  Vital signs for last 24 hours: Temp:  [97.6 F (36.4 C)-100.3 F (37.9 C)] 97.7 F (36.5 C) (06/22 0756) Pulse Rate:  [67-126] 100 (06/22 0756) Resp:  [12-20] 16 (06/22 0756) BP: (99-142)/(61-105) 116/81 (06/22 0756) SpO2:  [94 %-100 %] 99 % (06/22 0756)  Hemodynamic parameters for last 24 hours:    Intake/Output from previous day: 06/21 0701 - 06/22 0700 In: 1530 [P.O.:1530] Out: 3030 [Urine:3030]  Intake/Output this shift: No intake/output data recorded.  Vent settings for last 24 hours:    Physical Exam:  General: no respiratory distress Neuro: arouses and F/C HEENT/Neck: no JVD Resp: clear to auscultation bilaterally CVS: RRR GI: soft, NT, TLSO Extremities: calves soft Psych: oriented to self only.   Results for orders placed or performed during the hospital encounter of 07/02/20 (from the past 24 hour(s))  Basic metabolic panel     Status: Abnormal   Collection Time: 07/08/20  3:56 AM  Result Value Ref Range   Sodium 131 (L) 135 - 145 mmol/L   Potassium 3.7 3.5 - 5.1 mmol/L   Chloride 95 (L) 98 - 111 mmol/L   CO2 27 22 - 32 mmol/L   Glucose, Bld 101 (H) 70 - 99 mg/dL   BUN 7 6 - 20 mg/dL   Creatinine, Ser 6.04 (L) 0.61 - 1.24 mg/dL   Calcium 9.3 8.9 - 54.0 mg/dL   GFR, Estimated >98 >11 mL/min   Anion gap 9 5 - 15  CBC     Status: Abnormal   Collection Time: 07/08/20  3:56 AM  Result Value Ref Range   WBC 7.6 4.0 - 10.5 K/uL   RBC 3.03 (L) 4.22 - 5.81 MIL/uL    Hemoglobin 10.1 (L) 13.0 - 17.0 g/dL   HCT 91.4 (L) 78.2 - 95.6 %   MCV 98.3 80.0 - 100.0 fL   MCH 33.3 26.0 - 34.0 pg   MCHC 33.9 30.0 - 36.0 g/dL   RDW 21.3 (H) 08.6 - 57.8 %   Platelets 227 150 - 400 K/uL   nRBC 0.0 0.0 - 0.2 %    Assessment & Plan: Present on Admission:  Fall    LOS: 6 days   Additional comments:I reviewed the patient's new clinical lab test results. Marland Kitchen Fall   SAH - NSGY c/s, Dr. 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 Conchita Paris, TLSO brace for now, assess mobility/pain control in brace, MRI T-spine 6/17 T7 endplate fracture - NSGY c/s, Dr. Conchita Paris, pain control, already in TLSO brace, F/U outpatient in 2 weeks with repeat x-rays Cervicalgia - cont c-collar, MRI 6/17 noted; Right first rib fx - EKG, pain control, pulm toilet EtOH abuse - TOC c/s, CIWA, beer with meals, thiamine/folate, off of precedex and moved to progressive 6/20. Started BID seroquel 25mg  6/21. Right  scapular fracture, R clavicle fx - ortho c/s, Dr. Magnus Ivan, sling for now, final recs pending Elevated LFTs - monitor Questionable syncope - echo 6/17 LVEF 65-70% no abnormal wall motion/normal valves, carotid duplex pending - being performed today, EEG 6/17 without abnormality FEN - reg diet, repeat lytes DVT - SCDs, LMWH Dispo - Progressive care, CIWA, continue soft restraints - wean as able. Therapies. ?CIR if can locate appropriate outpatient support.   Hosie Spangle, PA-C  Trauma & General Surgery Use [email or web address] to contact on call provider  07/08/2020

## 2020-07-08 NOTE — TOC Progression Note (Signed)
Transition of Care [facility]) - Progression Note    Patient Details  Name: [name] MRN: [id number] Date of Birth: 1970/03/13  [facility] CM/SW Contact  Astrid Drafts Berna Spare, RN Phone Number: 07/08/2020, 3:22 PM  Clinical Narrative:  Orlene Erm with pt's friend "Byrd Hesselbach" via her teenage daughter Leavy Cella, interpreting for her.  Byrd Hesselbach states that pt IS homeless, but stays with her and her family a lot. She states that he does have some family in [place], but does not have any phone numbers for them.  She states that she is willing to take patient in at discharge and provide support as needed.  Byrd Hesselbach is willing to speak with inpatient rehab admissions coordinator to confirm support.    Spoke with Luther Parody, admissions coordinator for CIR; will place order for rehab consult.       Expected Discharge Plan: IP Rehab Facility Barriers to Discharge: Continued Medical Work up  Expected Discharge Plan and Services Expected Discharge Plan: IP Rehab Facility   Discharge Planning Services: CM Consult   Living arrangements for the past 2 months: Homeless                                       Social Determinants of Health (SDOH) Interventions    Readmission Risk Interventions No flowsheet data found.  Quintella Baton, RN, BSN  Trauma/Neuro ICU Case Manager [phone]

## 2020-07-09 LAB — BASIC METABOLIC PANEL
Anion gap: 11 (ref 5–15)
BUN: 5 mg/dL — ABNORMAL LOW (ref 6–20)
CO2: 27 mmol/L (ref 22–32)
Calcium: 9.7 mg/dL (ref 8.9–10.3)
Chloride: 96 mmol/L — ABNORMAL LOW (ref 98–111)
Creatinine, Ser: 0.58 mg/dL — ABNORMAL LOW (ref 0.61–1.24)
GFR, Estimated: >60 mL/min (ref >60)
Glucose, Bld: 129 mg/dL — ABNORMAL HIGH (ref 70–99)
Potassium: 3.7 mmol/L (ref 3.5–5.1)
Sodium: 134 mmol/L — ABNORMAL LOW (ref 135–145)

## 2020-07-09 MED ORDER — SPIRITUS FRUMENTI
1.0000 | Freq: Three times a day (TID) | ORAL | Status: DC
  Administered 2020-07-09 – 2020-07-17 (×24): 1 via ORAL
  Filled 2020-07-09 (×28): qty 1

## 2020-07-09 MED ORDER — SPIRITUS FRUMENTI
1.0000 | Freq: Three times a day (TID) | ORAL | Status: DC
  Filled 2020-07-09 (×2): qty 1

## 2020-07-09 NOTE — Progress Notes (Signed)
Inpatient Rehabilitation Admissions Coordinator   Graciella, interpreter, and I contacted Bernerd Pho,( an emergency contact )by phone to clarify prior living situation, PLOF, etc. Byrd Hesselbach and Bernerd Pho are sisters who 3 years ago offered to assist the patient who was homeless. He lives in their yard/patio and comes to sleep on the couch when very hot or cold outside. He at times leaves their property and is gone for awhile, but returns eventually. He has been complaining of back pain for 2 months and headaches. On the day of admission, they called EMS when he returned and had injuries. Margoht has agreed to meet at 10 am 6/24 with Graciella and I at bedside to meet with patient to assess his rehab needs and to clarify what caregiver supports they may be able to offer him.  Ottie Glazier, RN, MSN Rehab Admissions Coordinator [phone] 07/09/2020 3:11 PM

## 2020-07-09 NOTE — Progress Notes (Signed)
Physical Therapy Treatment Patient Details Name: Samuel [name] MRN: [id number] DOB: 1970-07-07 Today's Date: 07/09/2020    History of Present Illness 50 y.o. male found down outside his house - fall vs syncope.. Pt with T10, T 7 spinal fxs, cerivcalgia, R 1st, 3rd, 5th rib fx, R scapula and clavicle fx with questionable syncope, TBI with SAH; ETOH abuse - on CIWA.    PT Comments    The pt presents with increased restlessness and impulsivity at this time. Continues to have no recall of injuries, precautions to protect injuries, or carryover from prior therapy sessions. Multiple instances of twisting or bending occurred during session with max cues to avoid but pt with very limited STM and unable to recall instructions/cues. The pt was able to complete multiple sit-stand transfers with good power, and complete 150 ft of hallway ambulation at this time with VSS, but is at significant risk of falls as he had multiple LOB in various directions and demos no reactive stepping or other corrections, and relies solely on physical assist to maintain balance. The pt will continue to benefit from skilled PT acutely as well as post-acute rehab to facilitate return to safety and independence with mobility.     Follow Up Recommendations  CIR;Supervision/Assistance - 24 hour     Equipment Recommendations  None recommended by PT    Recommendations for Other Services       Precautions / Restrictions Precautions Precautions: Fall;Cervical;Back;Shoulder Type of Shoulder Precautions: sling and NWB at this time Shoulder Interventions: Shoulder sling/immobilizer Precaution Booklet Issued: No Precaution Comments: verbally reveiwed, pt unable to recal despite max cues at any time through session Required Braces or Orthoses: Spinal Brace;Sling;Cervical Brace Cervical Brace: Hard collar;At all times Spinal Brace: Thoracolumbosacral orthotic;Applied in sitting position Restrictions Weight  Bearing Restrictions: Yes RUE Weight Bearing: Non weight bearing    Mobility  Bed Mobility Overal bed mobility: Needs Assistance             General bed mobility comments: OOB in chair    Transfers Overall transfer level: Needs assistance Equipment used: 1 person hand held assist Transfers: Sit to/from Stand Sit to Stand: Min assist         General transfer comment: minA to steady, pt able to power up without assist, immediately with LOB in variety of directions  Ambulation/Gait Ambulation/Gait assistance: Min assist Gait Distance (Feet): 150 Feet Assistive device: 1 person hand held assist Gait Pattern/deviations: Step-through pattern;Decreased stride length;Drifts right/left Gait velocity: decreased   General Gait Details: Pt with dynamic instability, multiple episodes of loss of balance requiring minA to correct. RLE circumduction noted. pt with no reactive stepping to correct LOB.       Balance Overall balance assessment: Needs assistance Sitting-balance support: Feet supported Sitting balance-Leahy Scale: Fair     Standing balance support: During functional activity;Single extremity supported Standing balance-Leahy Scale: Fair Standing balance comment: multiple LOB in all directions, pt needing assist to correct as the pt had no evidence of reactive steps to correct.                            Cognition Arousal/Alertness: Awake/alert Behavior During Therapy: Restless;Impulsive Overall Cognitive Status: Impaired/Different from baseline Area of Impairment: Orientation;Attention;Memory;Following commands;Safety/judgement;Awareness;Problem solving                 Orientation Level: Disoriented to;Place;Time;Situation Current Attention Level: Focused Memory: Decreased recall of precautions;Decreased short-term memory Following Commands: Follows one  step commands with increased time;Follows multi-step commands  inconsistently Safety/Judgement: Decreased awareness of safety;Decreased awareness of deficits Awareness: Emergent Problem Solving: Slow processing;Decreased initiation;Requires verbal cues General Comments: pt oriented to self, unable to recall injuries (fractures in back or shoulder) despite max cues and re-orientation. no recall of spinal precautions despite max education. pt stating he would like to remove back brace due to discomfort. no safety awareness. Per RN, pt daughter visited yesterday, when pt asked directly about this he states he does not have children, and when pressed he states he does not know his daughter well.      Exercises      General Comments General comments (skin integrity, edema, etc.): VSS      Pertinent Vitals/Pain Pain Assessment: Faces Faces Pain Scale: Hurts even more Pain Location: back "all over", R shoulder Pain Descriptors / Indicators: Discomfort;Sore Pain Intervention(s): Limited activity within patient's tolerance;Monitored during session;Repositioned     PT Goals (current goals can now be found in the care plan section) Acute Rehab PT Goals Patient Stated Goal: to get a beer PT Goal Formulation: With patient Time For Goal Achievement: 07/18/20 Potential to Achieve Goals: Good Progress towards PT goals: Progressing toward goals    Frequency    Min 4X/week      PT Plan Current plan remains appropriate       AM-PAC PT "6 Clicks" Mobility   Outcome Measure  Help needed turning from your back to your side while in a flat bed without using bedrails?: A Little Help needed moving from lying on your back to sitting on the side of a flat bed without using bedrails?: A Little Help needed moving to and from a bed to a chair (including a wheelchair)?: A Little Help needed standing up from a chair using your arms (e.g., wheelchair or bedside chair)?: A Little Help needed to walk in hospital room?: A Little Help needed climbing 3-5 steps with  a railing? : A Lot 6 Click Score: 17    End of Session Equipment Utilized During Treatment: Gait belt;Back brace;Cervical collar Activity Tolerance: Patient tolerated treatment well Patient left: in chair;with call bell/phone within reach;with chair alarm set;with restraints reapplied Nurse Communication: Mobility status (pt wanting a beer) PT Visit Diagnosis: Other abnormalities of gait and mobility (R26.89);Pain Pain - Right/Left: Right Pain - part of body: Shoulder;Arm (back)     Time: 3500-9381 PT Time Calculation (min) (ACUTE ONLY): 32 min  Charges:  $Gait Training: 8-22 mins $Therapeutic Activity: 8-22 mins                     [name], PT, DPT   Acute Rehabilitation Department Pager #: [phone]   [name] 07/09/2020, 12:33 PM

## 2020-07-09 NOTE — Progress Notes (Signed)
  Speech Language Pathology Treatment: Cognitive-Linquistic  Patient Details Name: Samuel [name] MRN: [id number] DOB: 03-31-1970 Today's Date: 07/09/2020 Time: 1330-1400 SLP Time Calculation (min) (ACUTE ONLY): 30 min  Assessment / Plan / Recommendation Clinical Impression  Pt seen in chair, starting to try and get up. Pt did ask for assistance with urinal and to go the toilet. Agreeable to collar placement. SLP assisted to bathroom, pt completed tasks without cueing including washing his hands. Pt needed assistance with orientation to situation and reasoning for injuries and precautions. This time, he did repeat information after a delay. He also read three sentences regarding orientation and safety posted on his bathroom door. Pt is able to read simple sentences and completed this x3. Hopeful that visual reminders will assist with memory.    HPI HPI: 50 y.o. male found down outside his house - fall vs syncope.. Pt with T10, T 7 spinal fxs, cerivcalgia, R first rib fx, R scapula and clavicle fxs (sling and NWB - pending work up), questionable syncope, TBI with SAH; ETOH abuse - on CIWA.      SLP Plan  Continue with current plan of care       Recommendations                   Plan: Continue with current plan of care       GO                Samuel [name], [name] 07/09/2020, 2:07 PM

## 2020-07-09 NOTE — Progress Notes (Signed)
Inpatient Rehabilitation Admissions Coordinator   Inpatient rehab consult received. I met with patient with his nurse and utilizing Ipad interpretor Ivonne [phone]. Patient alert to self only, not situation or place. I will contact Verdis Frederickson to discuss caregivers support and dispo options to assist with planning rehab options.  Danne Baxter, RN, MSN Rehab Admissions Coordinator [phone] 07/09/2020 10:24 AM

## 2020-07-09 NOTE — Progress Notes (Signed)
Patient no longer in restraints at shift change.

## 2020-07-09 NOTE — Progress Notes (Signed)
This nurse has explained to the patient that he must wear his TLSO brace and if removed he should be in bed. Patient denies wanting to sleep in bed and patient says he prefers to sleep on the floor by the window. Patient is confused and believes someone other than him will be sleeping on his bed. Patient was educated but hard to convince. Patient removed TLSO brace and is currently sitting in his chair.   Mikki Harbor, RN

## 2020-07-09 NOTE — Progress Notes (Addendum)
Patient ID: Samuel [name], male   DOB: 01-03-1971, 50 y.o.   MRN: [id number]     Subjective: C/O back pain and R shoulder pain. Has been non-compliant with brace and sling. ROS negative except as listed above. Objective: Vital signs in last 24 hours: Temp:  [98.3 F (36.8 C)-99.7 F (37.6 C)] 99.7 F (37.6 C) (06/23 0300) Pulse Rate:  [100-113] 102 (06/23 0300) Resp:  [13-22] 13 (06/23 0300) BP: (100-143)/(72-97) 126/97 (06/23 0300) SpO2:  [94 %-98 %] 95 % (06/23 0300) Last BM Date: 07/08/20  Intake/Output from previous day: 06/22 0701 - 06/23 0700 In: 750 [P.O.:750] Out: 600 [Urine:600] Intake/Output this shift: No intake/output data recorded.  General appearance: alert and cooperative Back: some tenderness Resp: clear to auscultation bilaterally Cardio: regular rate and rhythm GI: soft, NT Extremities: dressings R shoulder, tender  Lab Results: CBC  Recent Labs    07/07/20 0234 07/08/20 0356  WBC 7.6 7.6  HGB 9.9* 10.1*  HCT 28.9* 29.8*  PLT 212 227   BMET Recent Labs    07/08/20 0356 07/09/20 0323  NA 131* 134*  K 3.7 3.7  CL 95* 96*  CO2 27 27  GLUCOSE 101* 129*  BUN 7 5*  CREATININE 0.60* 0.58*  CALCIUM 9.3 9.7   PT/INR No results for input(s): LABPROT, INR in the last 72 hours. ABG No results for input(s): PHART, HCO3 in the last 72 hours.  Invalid input(s): PCO2, PO2  Studies/Results: No results found.  Anti-infectives: Anti-infectives (From admission, onward)    None       Assessment/Plan: [facility] - NSGY c/s, Dr. 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 Conchita Paris, TLSO brace for now, assess mobility/pain control in brace, MRI T-spine 6/17 T7 endplate fracture - NSGY c/s, Dr. Conchita Paris, pain control, already in TLSO brace, F/U outpatient in 2 weeks with repeat x-rays Cervicalgia - cont c-collar, MRI 6/17 noted; Right first rib fx -  EKG, pain control, pulm toilet EtOH abuse - TOC c/s, CIWA, beer with meals, thiamine/folate, off of precedex and moved to progressive 6/20. Started BID seroquel 25mg  6/21. Right scapular fracture, R clavicle fx - ortho c/s, Dr. 7/21, sling for now, final recs pending Elevated LFTs - monitor Questionable syncope - echo 6/17 LVEF 65-70% no abnormal wall motion/normal valves, carotid duplex could not be completed as he would not hold still, EEG 6/17 without abnormality FEN - reg diet, repeat lytes DVT - SCDs, LMWH Dispo - Progressive care, CIWA, possible CIR. He reports he has family in [place] as well. Encouraged compliance with brace and sling.  LOS: 7 days    [place], MD, MPH, FACS Trauma & General Surgery Use [email or web address] to contact on call provider  07/09/2020

## 2020-07-10 MED ORDER — SPIRITUS FRUMENTI
1.0000 | Freq: Once | ORAL | Status: AC
  Administered 2020-07-10: 1 via ORAL
  Filled 2020-07-10: qty 1

## 2020-07-10 NOTE — Progress Notes (Signed)
Physical Therapy Treatment Patient Details Name: Samuel [name] MRN: [id number] DOB: [birth year] Today's Date: 07/10/2020    History of Present Illness 50 y.o. male found down outside his house - fall vs syncope.. Pt with T10, T 7 spinal fxs, cerivcalgia, R 1st, 3rd, 5th rib fx, R scapula and clavicle fx with questionable syncope, TBI with SAH; ETOH abuse - on CIWA.    PT Comments    Pt sitting up at EOB without any brace or RUE sling upon arrival of PT/OT and interpreter. Significant time and efforts spent reminding pt of need for brace, spinal precautions, need for RUE sling, and WB status. Pt continued to break precautions throughout session despite best efforts to redirect movements. Pt disorientation continues, needing max cues to attempt to re-orient to situation and injuries. The pt was able to complete hallway ambulation with reduced frequency of LOB compared to prior sessions, but with continued drifting and intermittent staggering steps to R requiring min-modA to steady. Per CIR admissions coordinator, pt lacks support for CIR admit, and place where he had been living states they are unable to provide level of assist currently needed. Will benefit from SNF level rehab at d/c to provide 24/7 supervision for pt safety.     Follow Up Recommendations  SNF;Supervision for mobility/OOB     Equipment Recommendations  None recommended by PT    Recommendations for Other Services       Precautions / Restrictions Precautions Precautions: Fall;Cervical;Back;Shoulder Type of Shoulder Precautions: sling and NWB at this time Shoulder Interventions: Shoulder sling/immobilizer Precaution Booklet Issued: No Precaution Comments: verbally reveiwed, pt unable to recal despite max cues at any time through session Required Braces or Orthoses: Spinal Brace;Sling;Cervical Brace Cervical Brace: Hard collar;At all times Spinal Brace: Thoracolumbosacral orthotic;Applied in sitting  position Restrictions Weight Bearing Restrictions: Yes RUE Weight Bearing: Non weight bearing    Mobility  Bed Mobility Overal bed mobility: Needs Assistance Bed Mobility: Sit to Supine     Supine to sit: Modified independent (Device/Increase time) Sit to supine: Min guard   General bed mobility comments: pt moving to sitting EOB impulsively, not following spinal or cervical precautions, using RUE to push depsite explicit cues not to twist or use RUE    Transfers Overall transfer level: Needs assistance Equipment used: 1 person hand held assist Transfers: Sit to/from [employer] Sit to Stand: Min assist Stand pivot transfers: Min assist       General transfer comment: minA to steady, pt able to power up without assist, immediately with LOB in variety of directions  Ambulation/Gait Ambulation/Gait assistance: Min assist Gait Distance (Feet): 300 Feet Assistive device: 1 person hand held assist Gait Pattern/deviations: Step-through pattern;Decreased stride length;Drifts right/left;Staggering right Gait velocity: decreased Gait velocity interpretation: <1.31 ft/sec, indicative of household ambulator General Gait Details: pt with dynamic instability, drifiting to R with multiple episodes of staggering or leaning to R with no efforts to correct. no overt LOB this session.   Stairs Stairs: Yes Stairs assistance: Mod assist Stair Management: No rails;Step to pattern;Forwards Number of Stairs: 1        Balance Overall balance assessment: Needs assistance Sitting-balance support: Feet supported Sitting balance-Leahy Scale: Fair     Standing balance support: During functional activity;Single extremity supported Standing balance-Leahy Scale: Fair Standing balance comment: multiple LOB in all directions, pt needing assist to correct as the pt had no evidence of reactive steps to correct.  Cognition Arousal/Alertness:  Awake/alert Behavior During Therapy: Restless;Impulsive Overall Cognitive Status: Impaired/Different from baseline Area of Impairment: Orientation;Attention;Memory;Following commands;Safety/judgement;Awareness;Problem solving                 Orientation Level: Disoriented to;Place;Time;Situation Current Attention Level: Focused Memory: Decreased recall of precautions;Decreased short-term memory Following Commands: Follows one step commands with increased time;Follows multi-step commands inconsistently Safety/Judgement: Decreased awareness of safety;Decreased awareness of deficits Awareness: Emergent Problem Solving: Slow processing;Decreased initiation;Requires verbal cues General Comments: pt oriented to self, unable to recall injuries (fractures in back or shoulder) despite max cues and re-orientation. no recall of spinal precautions despite max education. pt stating he would like to remove back brace due to discomfort. no safety awareness.      Exercises      General Comments General comments (skin integrity, edema, etc.): VSS, left with interpreter to assist with maintaining precautions and staying in bed      Pertinent Vitals/Pain Pain Assessment: Faces Faces Pain Scale: Hurts little more Pain Location: points to L rib area Pain Descriptors / Indicators: Discomfort;Sore Pain Intervention(s): Limited activity within patient's tolerance;Repositioned     PT Goals (current goals can now be found in the care plan section) Acute Rehab PT Goals Patient Stated Goal: to get a beer PT Goal Formulation: With patient Time For Goal Achievement: 07/18/20 Potential to Achieve Goals: Fair Progress towards PT goals: Progressing toward goals    Frequency    Min 4X/week      PT Plan Discharge plan needs to be updated    Co-evaluation PT/OT/SLP Co-Evaluation/Treatment: Yes Reason for Co-Treatment: For patient/therapist safety;To address functional/ADL transfers PT goals  addressed during session: Mobility/safety with mobility;Balance        AM-PAC PT "6 Clicks" Mobility   Outcome Measure  Help needed turning from your back to your side while in a flat bed without using bedrails?: A Little Help needed moving from lying on your back to sitting on the side of a flat bed without using bedrails?: A Little Help needed moving to and from a bed to a chair (including a wheelchair)?: A Little Help needed standing up from a chair using your arms (e.g., wheelchair or bedside chair)?: A Little Help needed to walk in hospital room?: A Little Help needed climbing 3-5 steps with a railing? : A Lot 6 Click Score: 17    End of Session Equipment Utilized During Treatment: Gait belt;Back brace;Cervical collar Activity Tolerance: Patient tolerated treatment well Patient left: with call bell/phone within reach;in bed;with bed alarm set Nurse Communication: Mobility status PT Visit Diagnosis: Other abnormalities of gait and mobility (R26.89);Pain Pain - Right/Left: Right Pain - part of body: Shoulder;Arm     Time: 1136-1209 PT Time Calculation (min) (ACUTE ONLY): 33 min  Charges:  $Gait Training: 8-22 mins                     [name], PT, DPT   Acute Rehabilitation Department Pager #: [phone]   [name] 07/10/2020, 1:38 PM

## 2020-07-10 NOTE — Progress Notes (Signed)
Occupational Therapy Treatment Patient Details Name: Samuel [name] MRN: [id number] DOB: 1970/05/18 Today's Date: 07/10/2020    History of present illness 50 y.o. male found down outside his house - fall vs syncope.. Pt with T10, T 7 spinal fxs, cerivcalgia, R 1st, 3rd, 5th rib fx, R scapula and clavicle fx with questionable syncope, TBI with SAH; ETOH abuse - on CIWA.   OT comments  Pt. Seen for skilled PT/OT treatment session.  Focused on precautions, precaution recall, and implementation of precautions during adls and functional mobility.  Poor carryover. Pt. Could verbalize the precaution and the rationale for them but would immediately twist back, un velcro back brace, and weight bear through R ue.  Perseverating on getting to the store throughout session.  Max cues for re direction to task.  Continue adls with integration of precautions next session.    Follow Up Recommendations       Equipment Recommendations  Tub/shower seat    Recommendations for Other Services      Precautions / Restrictions Precautions Precautions: Fall;Cervical;Back;Shoulder Type of Shoulder Precautions: sling and NWB at this time Shoulder Interventions: Shoulder sling/immobilizer Precaution Comments: verbally reveiwed, pt unable to recal despite max cues at any time through session Required Braces or Orthoses: Spinal Brace;Sling;Cervical Brace Cervical Brace: Hard collar;At all times Spinal Brace: Thoracolumbosacral orthotic;Applied in sitting position       Mobility Bed Mobility Overal bed mobility: Needs Assistance Bed Mobility: Sit to Supine       Sit to supine: Min guard        Transfers Overall transfer level: Needs assistance Equipment used: 1 person hand held assist Transfers: Sit to/from [employer] Sit to Stand: Min assist Stand pivot transfers: Min assist       General transfer comment: minA to steady, pt able to power up without assist,  immediately with LOB in variety of directions    Balance                                           ADL either performed or assessed with clinical judgement   ADL Overall ADL's : Needs assistance/impaired                 Upper Body Dressing : Maximal assistance;Sitting Upper Body Dressing Details (indicate cue type and reason): don sling and brace-pt. was fully dressed upon arrival. states he dressed himself                         Vision       Perception     Praxis      Cognition Arousal/Alertness: Awake/alert Behavior During Therapy: Restless;Impulsive Overall Cognitive Status: Impaired/Different from baseline Area of Impairment: Orientation;Attention;Memory;Following commands;Safety/judgement;Awareness;Problem solving                 Orientation Level: Disoriented to;Place;Time;Situation Current Attention Level: Focused Memory: Decreased recall of precautions;Decreased short-term memory Following Commands: Follows one step commands with increased time;Follows multi-step commands inconsistently Safety/Judgement: Decreased awareness of safety;Decreased awareness of deficits Awareness: Emergent Problem Solving: Slow processing;Decreased initiation;Requires verbal cues General Comments: pt oriented to self, unable to recall injuries (fractures in back or shoulder) despite max cues and re-orientation. no recall of spinal precautions despite max education. pt stating he would like to remove back brace due to discomfort. no safety awareness.  Exercises     Shoulder Instructions       General Comments      Pertinent Vitals/ Pain       Pain Assessment: Faces Faces Pain Scale: Hurts little more Pain Location: points to L rib area Pain Descriptors / Indicators: Discomfort;Sore Pain Intervention(s): Limited activity within patient's tolerance;Repositioned  Home Living                                           Prior Functioning/Environment              Frequency  Min 2X/week        Progress Toward Goals  OT Goals(current goals can now be found in the care plan section)  Progress towards OT goals: Progressing toward goals     Plan Discharge plan needs to be updated    Co-evaluation    PT/OT/SLP Co-Evaluation/Treatment: Yes Reason for Co-Treatment: For patient/therapist safety;To address functional/ADL transfers          AM-PAC OT "6 Clicks" Daily Activity     Outcome Measure   Help from another person eating meals?: A Lot Help from another person taking care of personal grooming?: A Lot Help from another person toileting, which includes using toliet, bedpan, or urinal?: A Lot Help from another person bathing (including washing, rinsing, drying)?: A Lot Help from another person to put on and taking off regular upper body clothing?: A Lot Help from another person to put on and taking off regular lower body clothing?: A Lot 6 Click Score: 12    End of Session Equipment Utilized During Treatment: Cervical collar;Back brace;Other (comment) (sling)  OT Visit Diagnosis: Unsteadiness on feet (R26.81);Other abnormalities of gait and mobility (R26.89);Muscle weakness (generalized) (M62.81);History of falling (Z91.81);Other symptoms and signs involving cognitive function;Dizziness and giddiness (R42);Pain Pain - Right/Left: Right Pain - part of body: Arm;Shoulder   Activity Tolerance Patient tolerated treatment well   Patient Left in bed;with call bell/phone within reach;with bed alarm set;Other (comment) (ipad interpreter communicating with pt. reminding him of precuations)   Nurse Communication          Time: 8185-6314 OT Time Calculation (min): 33 min  Charges: OT General Charges $OT Visit: 1 Visit OT Treatments $Self Care/Home Management : 8-22 mins  Boneta Lucks, COTA/L Acute Rehabilitation [phone]    Salvadore Oxford 07/10/2020, 1:33 PM

## 2020-07-10 NOTE — Progress Notes (Addendum)
Paged on call in regards to patients increased agitation. Pt confused, thinks he is at work and constantly stating he is leaving. Pt continues to get out of bed and is becoming less responsive to nursing staff request of lying/sitting in the bed or sitting in the chair. Order for enclosure bed received. Placed call to have enclosure bed brought to pts room.

## 2020-07-10 NOTE — Progress Notes (Addendum)
Inpatient Rehabilitation Admissions Coordinator   Margot has not arrived for 10 am meeting today. Rob Bunting will notify me if she makes contact . I have notified acute team and TOC that other rehab venues need to be pursued for he currently does not have the caregiver supports needed to d/c directly home or admit to [facility].  Danne Baxter, RN, MSN Rehab Admissions Coordinator [phone] 07/10/2020 10:40 AM  Margot and her daughter arrived and met with patient , Rob Bunting interpreting and I to discuss caregiver supports available. She states he is very confused and he is demonstrating that he is seeing things in the room that are not there, ex box of clothes and children. She and her family are unable to take responsibility for him at discharge. They are trying to reach a niece in [place] to obtain assistance. He has a brother Frankey Poot in [place] and other family but they have no contact information She would like to continue to visit him . She is requesting we find a place for him to go for help. Patient does not have the caregiver supports needed for a CIR admit therefore  other rehab venues need to be pursued. I will notify acute team and TOC.We will sign off at this time.  Danne Baxter, RN, MSN Rehab Admissions Coordinator [phone] 07/10/2020 11:35 AM

## 2020-07-10 NOTE — Progress Notes (Signed)
Patient ID: Samuel [name], male   DOB: 23-Oct-1970, 50 y.o.   MRN: [id number]     Subjective: Reports he is feeling better No pain Says he has been doing exercises ROS negative except as listed above. Objective: Vital signs in last 24 hours: Temp:  [97.7 F (36.5 C)-97.9 F (36.6 C)] 97.8 F (36.6 C) (06/24 0845) Pulse Rate:  [75-111] 111 (06/24 0845) Resp:  [18-20] 19 (06/24 0845) BP: (140-162)/(79-103) 146/93 (06/24 0845) SpO2:  [95 %-98 %] 98 % (06/24 0845) Last BM Date: 07/08/20  Intake/Output from previous day: 06/23 0701 - 06/24 0700 In: 800 [P.O.:800] Out: -  Intake/Output this shift: Total I/O In: 300 [P.O.:300] Out: -   General appearance: alert and cooperative Resp: clear to auscultation bilaterally Cardio: regular rate and rhythm GI: soft, NT Neuro: F/C but confused speech   Lab Results: CBC  Recent Labs    07/08/20 0356  WBC 7.6  HGB 10.1*  HCT 29.8*  PLT 227   BMET Recent Labs    07/08/20 0356 07/09/20 0323  NA 131* 134*  K 3.7 3.7  CL 95* 96*  CO2 27 27  GLUCOSE 101* 129*  BUN 7 5*  CREATININE 0.60* 0.58*  CALCIUM 9.3 9.7   PT/INR No results for input(s): LABPROT, INR in the last 72 hours. ABG No results for input(s): PHART, HCO3 in the last 72 hours.  Invalid input(s): PCO2, PO2  Studies/Results: No results found.  Anti-infectives: Anti-infectives (From admission, onward)    None       Assessment/Plan: [facility] - NSGY c/s, Dr. 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 Conchita Paris, TLSO brace for now, assess mobility/pain control in brace, MRI T-spine 6/17 T7 endplate fracture - NSGY c/s, Dr. Conchita Paris, pain control, already in TLSO brace, F/U outpatient in 2 weeks with repeat x-rays Cervicalgia - cont c-collar, MRI 6/17 noted; Right first rib fx - EKG, pain control, pulm toilet EtOH abuse - TOC c/s, CIWA, beer with meals,  thiamine/folate, seroquel 25mg  BID. Right scapular fracture, R clavicle fx - ortho c/s, Dr. , sling for now, final recs pending Elevated LFTs - monitor Questionable syncope - echo 6/17 LVEF 65-70% no abnormal wall motion/normal valves, carotid duplex could not be completed as he would not hold still, EEG 6/17 without abnormality FEN - reg diet, repeat lytes DVT - SCDs, LMWH Dispo - Progressive care, CIWA, CIR admissions coordinator meeting with his friend 7/17 today at 10am. Encouraged compliance with brace and sling. D/C tele.   LOS: 8 days    Bernerd Pho, MD, MPH, FACS Trauma & General Surgery Use [email or web address] to contact on call provider  07/10/2020

## 2020-07-10 NOTE — Progress Notes (Addendum)
Patient sitting up in chair at this time. Spanish speaking only. Interpreter used. Patient keeps requesting for another beer to drink. MD ordered 1 x for now. Patient accepts. No distress at this time. Says that he really "needs this." Chair alarm remains intact and working appropriately. Patient alert to self.

## 2020-07-11 NOTE — Progress Notes (Signed)
Patient ID: Samuel [name], male   DOB: 27-Aug-1970, 50 y.o.   MRN: [id number]     Subjective: No complaints this AM Asks to walk outside, feels better up moving.   Objective: Vital signs in last 24 hours: Temp:  [97.7 F (36.5 C)-99.8 F (37.7 C)] 97.7 F (36.5 C) (06/25 1100) Pulse Rate:  [93-102] 99 (06/25 1100) Resp:  [16-19] 16 (06/25 1100) BP: (117-148)/(86-97) 138/97 (06/25 1100) SpO2:  [96 %-100 %] 100 % (06/25 1100) Last BM Date: 07/08/20  Intake/Output from previous day: 06/24 0701 - 06/25 0700 In: 800 [P.O.:800] Out: 800 [Urine:800] Intake/Output this shift: No intake/output data recorded.  General appearance: alert and cooperative Resp: clear to auscultation bilaterally Cardio: regular rate and rhythm GI: soft, NT Neuro: F/C but confused speech   Lab Results: CBC  No results for input(s): WBC, HGB, HCT, PLT in the last 72 hours.  BMET Recent Labs    07/09/20 0323  NA 134*  K 3.7  CL 96*  CO2 27  GLUCOSE 129*  BUN 5*  CREATININE 0.58*  CALCIUM 9.7    PT/INR No results for input(s): LABPROT, INR in the last 72 hours. ABG No results for input(s): PHART, HCO3 in the last 72 hours.  Invalid input(s): PCO2, PO2  Studies/Results: No results found.  Anti-infectives: Anti-infectives (From admission, onward)    None       Assessment/Plan: [facility] - NSGY c/s, Dr. 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 Conchita Paris, TLSO brace for now, assess mobility/pain control in brace, MRI T-spine 6/17 T7 endplate fracture - NSGY c/s, Dr. Conchita Paris, pain control, already in TLSO brace, F/U outpatient in 2 weeks with repeat x-rays Cervicalgia - cont c-collar, MRI 6/17 noted; Right first rib fx - EKG, pain control, pulm toilet EtOH abuse - TOC c/s, CIWA, beer with meals, thiamine/folate, seroquel 25mg  BID. Right scapular fracture, R clavicle fx - ortho c/s, Dr.  , sling for now, final recs pending Elevated LFTs - monitor Questionable syncope - echo 6/17 LVEF 65-70% no abnormal wall motion/normal valves, carotid duplex could not be completed as he would not hold still, EEG 6/17 without abnormality FEN - reg diet, repeat lytes DVT - SCDs, LMWH Dispo - Progressive care, CIWA, CIR admissions coordinator meeting with his friend 7/17 today at 10am. Encouraged compliance with brace and sling. D/C tele.   LOS: 9 days   No major changes today, seems impulsiveness may be improving some after TBI but this is my first interaction with the patient.  Continue current care plan  07/11/2020

## 2020-07-11 NOTE — Plan of Care (Signed)
  Problem: Clinical Measurements: Goal: Ability to maintain clinical measurements within normal limits will improve Outcome: Progressing   Problem: Health Behavior/Discharge Planning: Goal: Ability to manage health-related needs will improve Outcome: Progressing   Problem: Clinical Measurements: Goal: Will remain free from infection Outcome: Progressing   Problem: Clinical Measurements: Goal: Diagnostic test results will improve Outcome: Progressing   Problem: Clinical Measurements: Goal: Respiratory complications will improve Outcome: Progressing   Problem: Clinical Measurements: Goal: Cardiovascular complication will be avoided Outcome: Progressing

## 2020-07-12 NOTE — Progress Notes (Signed)
Pt in recliner upon my arrival this morning, I attempted 3x to get him to put on his [place] j collar and TLSO brace but he refuses. Pt refusal continued throughout the day.

## 2020-07-12 NOTE — Progress Notes (Signed)
   Subjective/Chief Complaint: Complains of soreness right shoulder   Objective: Vital signs in last 24 hours: Temp:  [97.8 F (36.6 C)-99.6 F (37.6 C)] 97.8 F (36.6 C) (06/26 0700) Pulse Rate:  [90-98] 90 (06/26 0700) Resp:  [15-16] 16 (06/26 0700) BP: (108-135)/(76-96) 108/76 (06/26 0700) SpO2:  [98 %-100 %] 100 % (06/26 0700) Last BM Date: 07/11/20  Intake/Output from previous day: 06/25 0701 - 06/26 0700 In: 1270 [P.O.:1270] Out: -  Intake/Output this shift: No intake/output data recorded.  General appearance: alert and cooperative Resp: clear to auscultation bilaterally Cardio: regular rate and rhythm GI: soft, nontender  Lab Results:  No results for input(s): WBC, HGB, HCT, PLT in the last 72 hours. BMET No results for input(s): NA, K, CL, CO2, GLUCOSE, BUN, CREATININE, CALCIUM in the last 72 hours. PT/INR No results for input(s): LABPROT, INR in the last 72 hours. ABG No results for input(s): PHART, HCO3 in the last 72 hours.  Invalid input(s): PCO2, PO2  Studies/Results: No results found.  Anti-infectives: Anti-infectives (From admission, onward)    None       Assessment/Plan: s/p * No surgery found * Advance diet [facility] - NSGY c/s, Dr. 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 Conchita Paris, TLSO brace for now, assess mobility/pain control in brace, MRI T-spine 6/17 T7 endplate fracture - NSGY c/s, Dr. Conchita Paris, pain control, already in TLSO brace, F/U outpatient in 2 weeks with repeat x-rays Cervicalgia - cont c-collar, MRI 6/17 noted; Right first rib fx - EKG, pain control, pulm toilet EtOH abuse - TOC c/s, CIWA, beer with meals, thiamine/folate, seroquel 25mg  BID. Right scapular fracture, R clavicle fx - ortho c/s, Dr. , sling for now, final recs pending Elevated LFTs - monitor Questionable syncope - echo 6/17 LVEF 65-70% no abnormal wall motion/normal  valves, carotid duplex could not be completed as he would not hold still, EEG 6/17 without abnormality FEN - reg diet, repeat lytes DVT - SCDs, LMWH Dispo - Progressive care, CIWA, CIR admissions coordinator meeting with his friend 7/17 today at 10am. Encouraged compliance with brace and sling. D/C tele.  LOS: 10 days    Samuel [name] 07/12/2020

## 2020-07-13 ENCOUNTER — Inpatient Hospital Stay (HOSPITAL_COMMUNITY): Payer: Self-pay

## 2020-07-13 LAB — BASIC METABOLIC PANEL
Anion gap: 10 (ref 5–15)
BUN: 9 mg/dL (ref 6–20)
CO2: 25 mmol/L (ref 22–32)
Calcium: 9.1 mg/dL (ref 8.9–10.3)
Chloride: 101 mmol/L (ref 98–111)
Creatinine, Ser: 0.65 mg/dL (ref 0.61–1.24)
GFR, Estimated: >60 mL/min (ref >60)
Glucose, Bld: 92 mg/dL (ref 70–99)
Potassium: 3.9 mmol/L (ref 3.5–5.1)
Sodium: 136 mmol/L (ref 135–145)

## 2020-07-13 LAB — MAGNESIUM: Magnesium: 1.8 mg/dL (ref 1.7–2.4)

## 2020-07-13 LAB — PHOSPHORUS: Phosphorus: 4.4 mg/dL (ref 2.5–4.6)

## 2020-07-13 IMAGING — DX DG CLAVICLE*R*
2 series · 2 of 2 positions shown · non-contrast
Comparison: Chest CT [DATE]

CLINICAL DATA: Pain following trauma

EXAM:
RIGHT CLAVICLE - 2+ VIEWS

[clavicle ap]
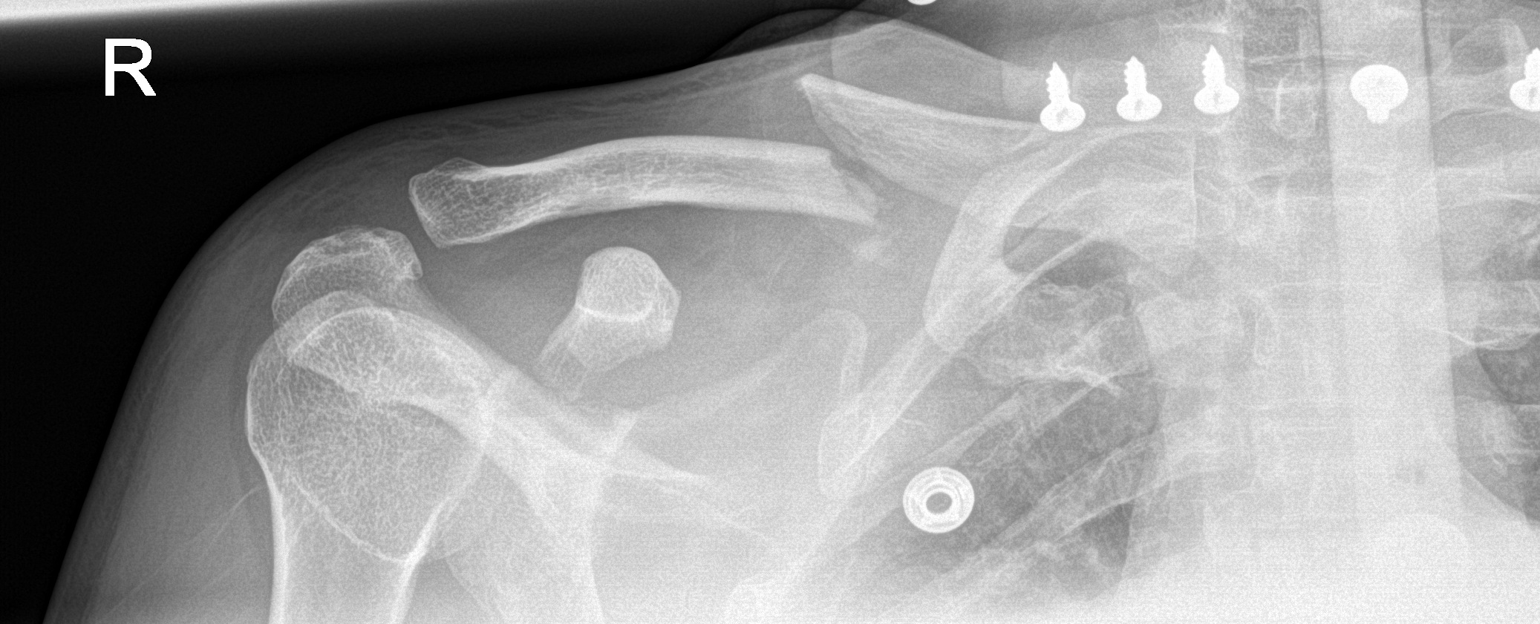

[clavicle axial]
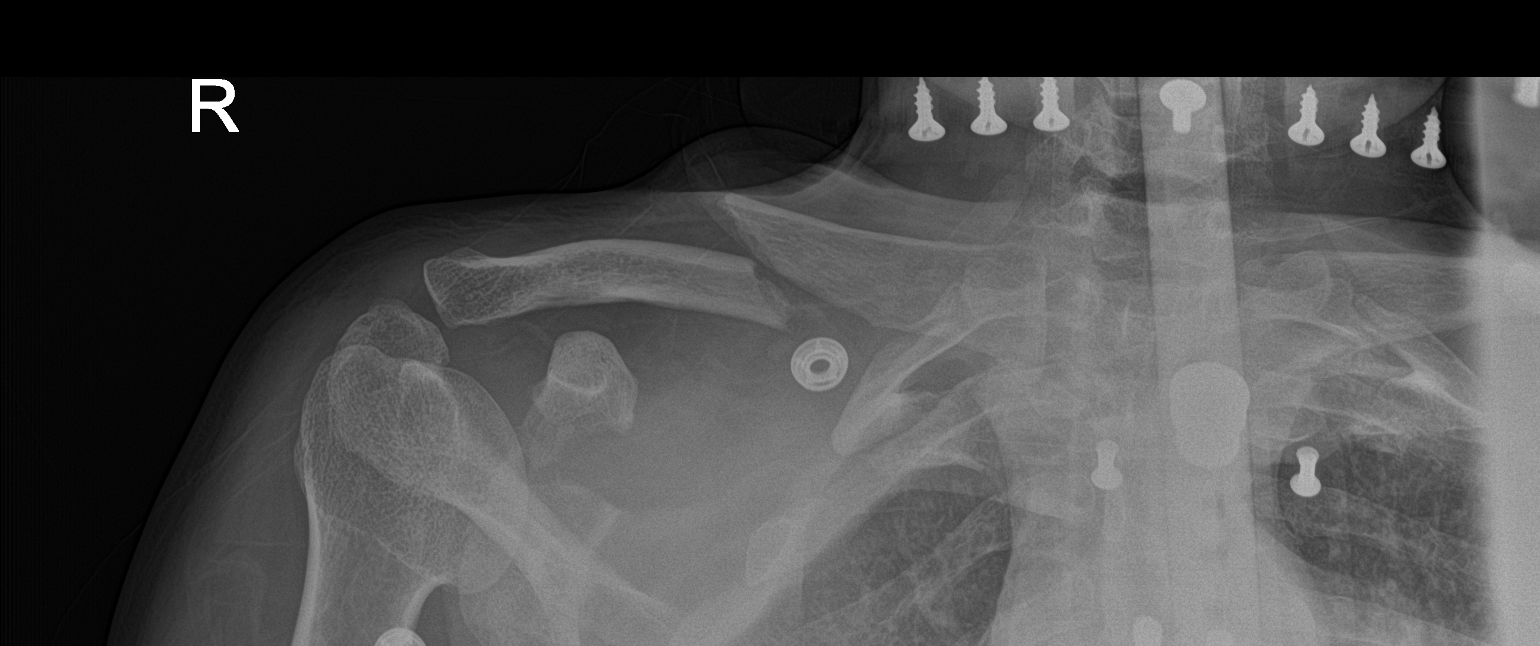

[2 of 2 positions shown; findings below may reference images not displayed]

FINDINGS: Frontal and angled frontal images obtained. There is an obliquely
oriented fracture through the mid clavicle with inferior
displacement of the lateral fracture fragment with respect to the
more proximal fragment. There is also a fracture of the proximal
aspect of the coracoid process of the scapula. This fracture appears
to extend to involve the superior most aspect of the glenoid on the
right. No dislocation. Joint spaces appear normal. Visualized right
lung clear.
IMPRESSION: Obliquely oriented fracture mid clavicle with inferior displacement
the lateral fracture fragment with respect to the more medial
fragment. Fracture coracoid process of the scapula along its
proximal aspect with displacement in this area. This fracture
appears to involve the superior aspect of the right glenoid. No
other evident fractures. No dislocations. No appreciable
arthropathy.

## 2020-07-13 MED ORDER — SODIUM CHLORIDE 1 G PO TABS: 1.0000 g | ORAL_TABLET | Freq: Two times a day (BID) | ORAL | Status: DC

## 2020-07-13 MED ORDER — HYDROMORPHONE HCL 1 MG/ML IJ SOLN: 0.2500 mg | Freq: Four times a day (QID) | INTRAMUSCULAR | Status: DC | PRN

## 2020-07-13 NOTE — Progress Notes (Signed)
Patient set up in recliner chair during entire shift and did not get in bed at all. Soft waist restraint used unless ambulating to bathroom and in room with staff.

## 2020-07-13 NOTE — Progress Notes (Addendum)
Physical Therapy Treatment Patient Details Name: Samuel [name] MRN: [id number] DOB: 1970/05/17 Today's Date: 07/13/2020    History of Present Illness 50 y.o. male found down outside his house - fall vs syncope.. Pt with T10, T 7 spinal fxs, cerivcalgia, R 1st, 3rd, 5th rib fx, R scapula and clavicle fx with questionable syncope, TBI with SAH; ETOH abuse - on CIWA.    PT Comments    Pt progressing towards his mobility goals, however, cognition remains significantly impacted. Remains oriented to self only. Pt ambulating hallway distances with no assistive device at a min guard-min assist level. Presents as as high fall risk based on decreased gait speed and safety awareness. Continue to recommend SNF for ongoing Physical Therapy.     Follow Up Recommendations  SNF;Supervision for mobility/OOB     Equipment Recommendations  None recommended by PT    Recommendations for Other Services       Precautions / Restrictions Precautions Precautions: Fall;Back;Shoulder Type of Shoulder Precautions: sling and NWB at this time Shoulder Interventions: Shoulder sling/immobilizer Precaution Booklet Issued: No Precaution Comments: Cervical collar d/c'ed 6/27 per Nundkumar, posey belt Required Braces or Orthoses: Spinal Brace;Sling Spinal Brace: Thoracolumbosacral orthotic;Applied in sitting position Restrictions Weight Bearing Restrictions: Yes RUE Weight Bearing: Non weight bearing    Mobility  Bed Mobility               General bed mobility comments: OOB in chair    Transfers Overall transfer level: Needs assistance Equipment used: None Transfers: Sit to/from Stand Sit to Stand: Min guard         General transfer comment: min guard to rise from chair  Ambulation/Gait Ambulation/Gait assistance: Min assist;Min guard Gait Distance (Feet): 400 Feet Assistive device: None Gait Pattern/deviations: Step-through pattern;Decreased stride length Gait velocity:  decreased   General Gait Details: Pt with dynamic instability; one mild posterior LOB requiring minA to correct. cues for environmental negotiation   Stairs Stairs: Yes Stairs assistance: Min guard Stair Management: Forwards;One rail Left Number of Stairs: 12 General stair comments: Cues for use of L railing rather than R   Wheelchair Mobility    Modified Rankin (Stroke Patients Only)       Balance Overall balance assessment: Needs assistance Sitting-balance support: Feet supported Sitting balance-Leahy Scale: Good     Standing balance support: During functional activity;Single extremity supported Standing balance-Leahy Scale: Fair                              Cognition Arousal/Alertness: Awake/alert Behavior During Therapy: Impulsive Overall Cognitive Status: Impaired/Different from baseline Area of Impairment: Orientation;Attention;Memory;Following commands;Safety/judgement;Awareness;Problem solving                 Orientation Level: Disoriented to;Place;Time;Situation Current Attention Level: Sustained Memory: Decreased recall of precautions;Decreased short-term memory Following Commands: Follows one step commands with increased time;Follows multi-step commands inconsistently Safety/Judgement: Decreased awareness of safety;Decreased awareness of deficits Awareness: Emergent Problem Solving: Slow processing;Decreased initiation;Requires verbal cues General Comments: took pt to hallway with windows; pt stating, "I thought I was at the apartment. But I think I am at the hospital." Pt with no braces or sling donned up in chair. When I donned his back brace, pt stating, "It was filled with sand and pebbles."      Exercises      General Comments        Pertinent Vitals/Pain Pain Assessment: Faces Faces Pain Scale: Hurts a little  bit Pain Location: R shoulder Pain Descriptors / Indicators: Discomfort;Sore Pain Intervention(s): Monitored during  session    Home Living                      Prior Function            PT Goals (current goals can now be found in the care plan section) Acute Rehab PT Goals PT Goal Formulation: With patient Time For Goal Achievement: 07/18/20 Potential to Achieve Goals: Fair Progress towards PT goals: Progressing toward goals    Frequency    Min 3X/week      PT Plan Frequency needs to be updated    Co-evaluation             AM-PAC PT "6 Clicks" Mobility   Outcome Measure  Help needed turning from your back to your side while in a flat bed without using bedrails?: None Help needed moving from lying on your back to sitting on the side of a flat bed without using bedrails?: A Little Help needed moving to and from a bed to a chair (including a wheelchair)?: A Little Help needed standing up from a chair using your arms (e.g., wheelchair or bedside chair)?: A Little Help needed to walk in hospital room?: A Little Help needed climbing 3-5 steps with a railing? : A Little 6 Click Score: 19    End of Session Equipment Utilized During Treatment: Gait belt;Back brace Activity Tolerance: Patient tolerated treatment well Patient left: with call bell/phone within reach;in bed;with bed alarm set Nurse Communication: Mobility status PT Visit Diagnosis: Other abnormalities of gait and mobility (R26.89);Pain Pain - Right/Left: Right Pain - part of body: Shoulder;Arm     Time: 5625-6389 PT Time Calculation (min) (ACUTE ONLY): 27 min  Charges:  $Therapeutic Activity: 23-37 mins                     Lillia Pauls, PT, DPT Acute Rehabilitation Services Pager [phone] Office [phone]    Norval Morton 07/13/2020, 1:11 PM

## 2020-07-13 NOTE — Plan of Care (Signed)
  Problem: Coping: Goal: Level of anxiety will decrease Outcome: Progressing   Problem: Pain Managment: Goal: General experience of comfort will improve Outcome: Progressing   Problem: Safety: Goal: Ability to remain free from injury will improve Outcome: Progressing   Problem: Physical Regulation: Goal: Complications related to the disease process, condition or treatment will be avoided or minimized Outcome: Progressing   Problem: Safety: Goal: Non-violent Restraint(s) Outcome: Progressing

## 2020-07-13 NOTE — Progress Notes (Signed)
Alert and pleasant. Refuses to wear brace and sling. Pain to right shoulder but refuses pain medication stating he does not like to take medicine. Sitting up in chair with alarm on. New batteries placed in chair alarm.

## 2020-07-13 NOTE — Progress Notes (Signed)
   Trauma/Critical Care Follow Up Note  Subjective:    Overnight Issues:   Objective:  Vital signs for last 24 hours: Temp:  [98 F (36.7 C)-99.2 F (37.3 C)] 98 F (36.7 C) (06/27 0816) Pulse Rate:  [75-97] 82 (06/27 0816) Resp:  [15-18] 18 (06/27 0816) BP: (113-134)/(78-97) 134/97 (06/27 0816) SpO2:  [97 %-100 %] 99 % (06/27 0816)  Hemodynamic parameters for last 24 hours:    Intake/Output from previous day: 06/26 0701 - 06/27 0700 In: 1580 [P.O.:1580] Out: 1100 [Urine:1100]  Intake/Output this shift: Total I/O In: 120 [P.O.:120] Out: -   Vent settings for last 24 hours:    Physical Exam:  Gen: comfortable, no distress Neuro: non-focal exam HEENT: PERRL Neck: supple CV: RRR Pulm: unlabored breathing Abd: soft, NT, Posey GU: clear yellow urine Extr: wwp, no edema Back: refusing to wear brace   No results found for this or any previous visit (from the past 24 hour(s)).  Assessment & Plan: The plan of care was discussed with the bedside nurse for the day, who is in agreement with this plan and no additional concerns were raised.   Present on Admission:  Fall    LOS: 11 days   Additional comments:I reviewed the patient's new clinical lab test results.   and I reviewed the patients new imaging test results.    Fall   SAH - NSGY c/s, Dr. 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 Conchita Paris, TLSO brace for now, assess mobility/pain control in brace, MRI T-spine 6/17 T7 endplate fracture - NSGY c/s, Dr. Conchita Paris, pain control, already in TLSO brace, F/U outpatient in 2 weeks with repeat x-rays Cervicalgia - cont c-collar, MRI 6/17 noted Right first rib fx - EKG, pain control, pulm toilet EtOH abuse - TOC c/s, CIWA, beer with meals, thiamine/folate, seroquel 25mg  BID. Right scapular fracture, R clavicle fx - ortho c/s, Dr. , sling for now, final recs pending Elevated LFTs  - monitor Questionable syncope - echo 6/17 LVEF 65-70% no abnormal wall motion/normal valves, carotid duplex could not be completed as he would not hold still, EEG 6/17 without abnormality FEN - reg diet, check lytes DVT - SCDs, LMWH Dispo - 4NP  7/17, MD Trauma & General Surgery Please use [email or web address] to contact on call provider  07/13/2020  *Care during the described time interval was provided by me. I have reviewed this patient's available data, including medical history, events of note, physical examination and test results as part of my evaluation.

## 2020-07-14 ENCOUNTER — Encounter (HOSPITAL_COMMUNITY): Payer: Self-pay

## 2020-07-14 LAB — BASIC METABOLIC PANEL
Anion gap: 12 (ref 5–15)
BUN: 7 mg/dL (ref 6–20)
CO2: 26 mmol/L (ref 22–32)
Calcium: 9.3 mg/dL (ref 8.9–10.3)
Chloride: 97 mmol/L — ABNORMAL LOW (ref 98–111)
Creatinine, Ser: 0.57 mg/dL — ABNORMAL LOW (ref 0.61–1.24)
GFR, Estimated: >60 mL/min (ref >60)
Glucose, Bld: 94 mg/dL (ref 70–99)
Potassium: 3.6 mmol/L (ref 3.5–5.1)
Sodium: 135 mmol/L (ref 135–145)

## 2020-07-14 LAB — CBC
HCT: 36 % — ABNORMAL LOW (ref 39.0–52.0)
Hemoglobin: 11.9 g/dL — ABNORMAL LOW (ref 13.0–17.0)
MCH: 32.9 pg (ref 26.0–34.0)
MCHC: 33.1 g/dL (ref 30.0–36.0)
MCV: 99.4 fL (ref 80.0–100.0)
Platelets: 434 10*3/uL — ABNORMAL HIGH (ref 150–400)
RBC: 3.62 MIL/uL — ABNORMAL LOW (ref 4.22–5.81)
RDW: 15 % (ref 11.5–15.5)
WBC: 6.7 10*3/uL (ref 4.0–10.5)
nRBC: 0 % (ref 0.0–0.2)

## 2020-07-14 NOTE — Progress Notes (Signed)
Trauma/Critical Care Follow Up Note  Subjective:    Overnight Issues:   Objective:  Vital signs for last 24 hours: Temp:  [98.5 F (36.9 C)-99.1 F (37.3 C)] 99 F (37.2 C) (06/28 0900) Pulse Rate:  [78-102] 78 (06/28 0900) Resp:  [16-18] 16 (06/28 0900) BP: (114-132)/(70-91) 132/82 (06/28 0900) SpO2:  [96 %-99 %] 98 % (06/28 0900)  Hemodynamic parameters for last 24 hours:    Intake/Output from previous day: 06/27 0701 - 06/28 0700 In: 1320 [P.O.:1320] Out: 2000 [Urine:2000]  Intake/Output this shift: Total I/O In: 420 [P.O.:420] Out: 526 [Urine:525; Stool:1]  Vent settings for last 24 hours:    Physical Exam:  Gen: comfortable, no distress Neuro: non-focal exam HEENT: PERRL Neck: supple CV: RRR Pulm: unlabored breathing Abd: soft, NT GU: clear yellow urine Extr: wwp, no edema   Results for orders placed or performed during the hospital encounter of 07/02/20 (from the past 24 hour(s))  Basic metabolic panel     Status: None   Collection Time: 07/13/20  2:06 PM  Result Value Ref Range   Sodium 136 135 - 145 mmol/L   Potassium 3.9 3.5 - 5.1 mmol/L   Chloride 101 98 - 111 mmol/L   CO2 25 22 - 32 mmol/L   Glucose, Bld 92 70 - 99 mg/dL   BUN 9 6 - 20 mg/dL   Creatinine, Ser 2.35 0.61 - 1.24 mg/dL   Calcium 9.1 8.9 - 36.1 mg/dL   GFR, Estimated >44 >31 mL/min   Anion gap 10 5 - 15  Magnesium     Status: None   Collection Time: 07/13/20  2:06 PM  Result Value Ref Range   Magnesium 1.8 1.7 - 2.4 mg/dL  Phosphorus     Status: None   Collection Time: 07/13/20  2:06 PM  Result Value Ref Range   Phosphorus 4.4 2.5 - 4.6 mg/dL  Basic metabolic panel     Status: Abnormal   Collection Time: 07/14/20  6:44 AM  Result Value Ref Range   Sodium 135 135 - 145 mmol/L   Potassium 3.6 3.5 - 5.1 mmol/L   Chloride 97 (L) 98 - 111 mmol/L   CO2 26 22 - 32 mmol/L   Glucose, Bld 94 70 - 99 mg/dL   BUN 7 6 - 20 mg/dL   Creatinine, Ser 5.40 (L) 0.61 - 1.24 mg/dL    Calcium 9.3 8.9 - 08.6 mg/dL   GFR, Estimated >76 >19 mL/min   Anion gap 12 5 - 15  CBC     Status: Abnormal   Collection Time: 07/14/20  6:44 AM  Result Value Ref Range   WBC 6.7 4.0 - 10.5 K/uL   RBC 3.62 (L) 4.22 - 5.81 MIL/uL   Hemoglobin 11.9 (L) 13.0 - 17.0 g/dL   HCT 50.9 (L) 32.6 - 71.2 %   MCV 99.4 80.0 - 100.0 fL   MCH 32.9 26.0 - 34.0 pg   MCHC 33.1 30.0 - 36.0 g/dL   RDW 45.8 09.9 - 83.3 %   Platelets 434 (H) 150 - 400 K/uL   nRBC 0.0 0.0 - 0.2 %    Assessment & Plan:  Present on Admission:  Fall    LOS: 12 days   Additional comments:I reviewed the patient's new clinical lab test results.   and I reviewed the patients new imaging test results.    Fall   SAH - NSGY c/s, Dr. Conchita Paris, repeat CT head 6/17 stable, keppra x7d for sz ppx, TBI  tx 3 column fracture of T10 vertebra - NSGY c/s, Dr. Conchita Paris, TLSO brace for now, assess mobility/pain control in brace, MRI T-spine 6/17 T7 endplate fracture - NSGY c/s, Dr. Conchita Paris, pain control, already in TLSO brace, F/U outpatient in 2 weeks with repeat x-rays Cervicalgia - cont c-collar, MRI 6/17 noted Right first rib fx - EKG, pain control, pulm toilet EtOH abuse - TOC c/s, CIWA, beer with meals, thiamine/folate, seroquel 25mg  BID. Right scapular fracture, R clavicle fx - ortho c/s, Dr. , sling for now, final recs pending Elevated LFTs - monitor Questionable syncope - echo 6/17 LVEF 65-70% no abnormal wall motion/normal valves, carotid duplex could not be completed as he would not hold still, EEG 6/17 without abnormality FEN - reg diet, check lytes DVT - SCDs, LMWH Dispo - 4NP    7/17, MD Trauma & General Surgery Please use [email or web address] to contact on call provider  07/14/2020  *Care during the described time interval was provided by me. I have reviewed this patient's available data, including medical history, events of note, physical examination and test results as part of my evaluation.

## 2020-07-14 NOTE — Progress Notes (Signed)
Verbal order to renew restraint order.

## 2020-07-14 NOTE — Progress Notes (Signed)
Occupational Therapy Treatment Patient Details Name: Samuel [name] MRN: [id number] DOB: [birth year] Today's Date: 07/14/2020    History of present illness 50 y.o. male found down outside his house - fall vs syncope.. Pt with T10, T 7 spinal fxs, cerivcalgia, R 1st, 3rd, 5th rib fx, R scapula and clavicle fx with questionable syncope, TBI with SAH; ETOH abuse - on CIWA.   OT comments  Pt following all commands, difficulty with problem solving to don brace and then no awareness of safety and wanting to keep brace off and sling off. Pt performing ADL tasks with set-upA at sink for grooming; supervisionA for toilet hygiene and modA for donning/doffing brace and shoulder immobilizer. Pt education on back precautions and continues to require cues.  Sessions to focus on safety with ADL and cognition.  Pt would benefit from continued OT skilled services. OT following acutely.   Follow Up Recommendations  SNF    Equipment Recommendations  Tub/shower seat    Recommendations for Other Services      Precautions / Restrictions Precautions Precautions: Fall;Back;Shoulder Type of Shoulder Precautions: sling and NWB at this time Shoulder Interventions: Shoulder sling/immobilizer Precaution Booklet Issued: No Precaution Comments: Cervical collar d/c'ed 6/27 per Nundkumar, posey belt Required Braces or Orthoses: Spinal Brace;Sling Spinal Brace: Thoracolumbosacral orthotic;Applied in sitting position Restrictions Weight Bearing Restrictions: Yes RUE Weight Bearing: Non weight bearing       Mobility Bed Mobility               General bed mobility comments: in recliner    Transfers Overall transfer level: Needs assistance Equipment used: None Transfers: Sit to/from Stand Sit to Stand: Min guard         General transfer comment: min guard to rise from chair    Balance Overall balance assessment: Needs assistance Sitting-balance support: Feet supported Sitting  balance-Leahy Scale: Good     Standing balance support: During functional activity;Single extremity supported Standing balance-Leahy Scale: Fair                             ADL either performed or assessed with clinical judgement   ADL Overall ADL's : Needs assistance/impaired Eating/Feeding: Set up;Sitting   Grooming: Supervision/safety;Standing;Cueing for safety;Cueing for sequencing Grooming Details (indicate cue type and reason): cues for back precautions         Upper Body Dressing : Moderate assistance;Sitting Upper Body Dressing Details (indicate cue type and reason): to donn brace                 Functional mobility during ADLs: Minimal assistance;+2 for safety/equipment;Cueing for sequencing;Cueing for safety General ADL Comments: Pt education on back precautions, decreased ability to care for self and increased pain in low back. Pt requiring multimodal cues to maintain back precautions and wanting to move posey belt and take off cervical brace.     Vision   Vision Assessment?: No apparent visual deficits   Perception     Praxis      Cognition Arousal/Alertness: Awake/alert Behavior During Therapy: WFL for tasks assessed/performed Overall Cognitive Status: Impaired/Different from baseline Area of Impairment: Safety/judgement;Problem solving                         Safety/Judgement: Decreased awareness of safety;Decreased awareness of deficits   Problem Solving: Slow processing;Decreased initiation;Requires verbal cues General Comments: Pt not wearing sling or brace seated, reclined in chair. Pt stating "  it's uncomfortable." Pt able to state "hospital" as place. Pt education on back precautions at sink.        Exercises     Shoulder Instructions       General Comments Pt requiring cues to keep brace on, but pt doffed brace and arm sling at this time while eated in recliner despite max cues to keep on/in order ot follow  precautions.    Pertinent Vitals/ Pain       Pain Assessment: Faces Faces Pain Scale: Hurts a little bit Pain Location: R shoulder Pain Descriptors / Indicators: Discomfort;Sore Pain Intervention(s): Monitored during session;Repositioned  Home Living                                          Prior Functioning/Environment              Frequency  Min 2X/week        Progress Toward Goals  OT Goals(current goals can now be found in the care plan section)  Progress towards OT goals: Progressing toward goals  Acute Rehab OT Goals Patient Stated Goal: to get a beer OT Goal Formulation: With patient Potential to Achieve Goals: Good ADL Goals Pt Will Perform Grooming: with supervision;standing Pt Will Perform Upper Body Bathing: with min assist;sitting Pt Will Perform Lower Body Bathing: with supervision;with adaptive equipment;sit to/from stand Pt Will Perform Upper Body Dressing: with modified independence;sitting Pt Will Perform Lower Body Dressing: with modified independence;sit to/from stand;with adaptive equipment Pt Will Transfer to Toilet: with modified independence;ambulating Additional ADL Goal #1: Pt will independnelty verbalize understanding of precautions Additional ADL Goal #2: Pt will manage back brace and sling with min A  Plan Discharge plan needs to be updated    Co-evaluation                 AM-PAC OT "6 Clicks" Daily Activity     Outcome Measure   Help from another person eating meals?: None Help from another person taking care of personal grooming?: None Help from another person toileting, which includes using toliet, bedpan, or urinal?: A Little Help from another person bathing (including washing, rinsing, drying)?: A Lot Help from another person to put on and taking off regular upper body clothing?: A Lot Help from another person to put on and taking off regular lower body clothing?: A Little 6 Click Score: 18    End of  Session Equipment Utilized During Treatment: Back brace;Other (comment) (sling)  OT Visit Diagnosis: Unsteadiness on feet (R26.81);Other abnormalities of gait and mobility (R26.89);Muscle weakness (generalized) (M62.81);History of falling (Z91.81);Other symptoms and signs involving cognitive function;Dizziness and giddiness (R42);Pain Pain - Right/Left: Right Pain - part of body: Arm;Shoulder   Activity Tolerance Patient tolerated treatment well   Patient Left in chair;with call bell/phone within reach;with chair alarm set   Nurse Communication Mobility status;Precautions;Weight bearing status        Time: 2841-3244 OT Time Calculation (min): 23 min  Charges: OT General Charges $OT Visit: 1 Visit OT Treatments $Self Care/Home Management : 8-22 mins $Cognitive Funtion inital: Initial 15 mins  Flora Lipps, OTR/L Acute Rehabilitation Services Pager: [phone] Office: [phone]    Samuel [name] C 07/14/2020, 5:01 PM

## 2020-07-15 LAB — CBC
HCT: 37.7 % — ABNORMAL LOW (ref 39.0–52.0)
Hemoglobin: 12.6 g/dL — ABNORMAL LOW (ref 13.0–17.0)
MCH: 32.8 pg (ref 26.0–34.0)
MCHC: 33.4 g/dL (ref 30.0–36.0)
MCV: 98.2 fL (ref 80.0–100.0)
Platelets: 470 10*3/uL — ABNORMAL HIGH (ref 150–400)
RBC: 3.84 MIL/uL — ABNORMAL LOW (ref 4.22–5.81)
RDW: 14.8 % (ref 11.5–15.5)
WBC: 8.5 10*3/uL (ref 4.0–10.5)
nRBC: 0 % (ref 0.0–0.2)

## 2020-07-15 LAB — BASIC METABOLIC PANEL
Anion gap: 9 (ref 5–15)
BUN: 7 mg/dL (ref 6–20)
CO2: 25 mmol/L (ref 22–32)
Calcium: 9.6 mg/dL (ref 8.9–10.3)
Chloride: 100 mmol/L (ref 98–111)
Creatinine, Ser: 0.55 mg/dL — ABNORMAL LOW (ref 0.61–1.24)
GFR, Estimated: >60 mL/min (ref >60)
Glucose, Bld: 122 mg/dL — ABNORMAL HIGH (ref 70–99)
Potassium: 3.9 mmol/L (ref 3.5–5.1)
Sodium: 134 mmol/L — ABNORMAL LOW (ref 135–145)

## 2020-07-15 NOTE — Progress Notes (Signed)
Physical Therapy Treatment Patient Details Name: Samuel [name] MRN: [id number] DOB: [birth year] Today's Date: 07/15/2020    History of Present Illness 50 y.o. male found down outside his house - fall vs syncope.. Pt with T10, T 7 spinal fxs, cerivcalgia, R 1st, 3rd, 5th rib fx, R scapula and clavicle fx with questionable syncope, TBI with SAH; ETOH abuse - on CIWA.    PT Comments    Pt still with impaired cognition; oriented to self only, states year is 2027 and that he is in the hospital, but not sure which one (incorrect guess of [place], [place]). Pt has no carryover or recall of precautions or brace use. Did don spinal brace today in sitting with min-mod cues. Took pt outside to practice unlevel and varying surface ambulation. Pt ambulating at a supervision level; needs reminders not to get too close to objects on the right side. Pt requires max cues to way find back to his room. Will continue to progress as tolerated.     Follow Up Recommendations  SNF;Supervision for mobility/OOB     Equipment Recommendations  None recommended by PT    Recommendations for Other Services       Precautions / Restrictions Precautions Precautions: Fall;Back;Shoulder Type of Shoulder Precautions: sling and NWB at this time Shoulder Interventions: Shoulder sling/immobilizer Precaution Booklet Issued: No Precaution Comments: Cervical collar d/c'ed 6/27 per Nundkumar Required Braces or Orthoses: Spinal Brace;Sling Spinal Brace: Thoracolumbosacral orthotic;Applied in sitting position Restrictions Weight Bearing Restrictions: Yes RUE Weight Bearing: Non weight bearing    Mobility  Bed Mobility               General bed mobility comments: in recliner    Transfers Overall transfer level: Independent Equipment used: None                Ambulation/Gait Ambulation/Gait assistance: Supervision Gait Distance (Feet): 600 Feet Assistive device: None Gait  Pattern/deviations: WFL(Within Functional Limits)     General Gait Details: Supervision for safety with negotiating unlevel surfaces i.e. ramps, thresholds   Stairs             Wheelchair Mobility    Modified Rankin (Stroke Patients Only)       Balance Overall balance assessment: Needs assistance Sitting-balance support: Feet supported Sitting balance-Leahy Scale: Normal       Standing balance-Leahy Scale: Good                              Cognition Arousal/Alertness: Awake/alert Behavior During Therapy: WFL for tasks assessed/performed Overall Cognitive Status: Impaired/Different from baseline Area of Impairment: Safety/judgement;Orientation                 Orientation Level: Disoriented to;Place;Time;Situation   Memory: Decreased recall of precautions;Decreased short-term memory   Safety/Judgement: Decreased awareness of safety;Decreased awareness of deficits     General Comments: Oriented to self only. Know he is in a hospital, but doesn't know which one and states he is in [place]. Able to correctly state "June," after increased time but stating year is 2027. Does not recall precautions or brace use      Exercises      General Comments        Pertinent Vitals/Pain Pain Assessment: Faces Faces Pain Scale: Hurts a little bit Pain Location: RLE Pain Descriptors / Indicators: Discomfort;Sore Pain Intervention(s): Monitored during session    Home Living  Prior Function            PT Goals (current goals can now be found in the care plan section) Acute Rehab PT Goals Potential to Achieve Goals: Good Additional Goals Additional Goal #1: pt will score 24/24 on dynamic gait index to improve balance and reduce fall risk. Progress towards PT goals: Goals met and updated - see care plan    Frequency    Min 2X/week      PT Plan Frequency needs to be updated    Co-evaluation               AM-PAC PT "6 Clicks" Mobility   Outcome Measure  Help needed turning from your back to your side while in a flat bed without using bedrails?: None Help needed moving from lying on your back to sitting on the side of a flat bed without using bedrails?: None Help needed moving to and from a bed to a chair (including a wheelchair)?: None Help needed standing up from a chair using your arms (e.g., wheelchair or bedside chair)?: None Help needed to walk in hospital room?: A Little Help needed climbing 3-5 steps with a railing? : A Little 6 Click Score: 22    End of Session Equipment Utilized During Treatment: Back brace;Other (comment) (sling) Activity Tolerance: Patient tolerated treatment well Patient left: in chair;with call bell/phone within reach;with chair alarm set Nurse Communication: Mobility status PT Visit Diagnosis: Other abnormalities of gait and mobility (R26.89);Pain     Time: 1003-1030 PT Time Calculation (min) (ACUTE ONLY): 27 min  Charges:  $Therapeutic Activity: 23-37 mins                     Wyona Almas, PT, DPT Acute Rehabilitation Services Pager [phone] Office [phone]    Deno Etienne 07/15/2020, 1:21 PM

## 2020-07-15 NOTE — Progress Notes (Signed)
Pt stayed in chair all night.

## 2020-07-15 NOTE — Progress Notes (Signed)
Trauma/Critical Care Follow Up Note  Subjective:    Overnight Issues:   Objective:  Vital signs for last 24 hours: Temp:  [98.7 F (37.1 C)-99 F (37.2 C)] 98.9 F (37.2 C) (06/29 0748) Pulse Rate:  [78-95] 93 (06/29 0748) Resp:  [15-18] 15 (06/29 0748) BP: (106-133)/(65-93) 106/65 (06/29 0748) SpO2:  [97 %-100 %] 97 % (06/29 0748)  Hemodynamic parameters for last 24 hours:    Intake/Output from previous day: 06/28 0701 - 06/29 0700 In: 1260 [P.O.:1260] Out: 1251 [Urine:1250; Stool:1]  Intake/Output this shift: Total I/O In: 600 [P.O.:600] Out: 700 [Urine:700]  Vent settings for last 24 hours:    Physical Exam:  Gen: comfortable, no distress Neuro: non-focal exam HEENT: PERRL Neck: supple CV: RRR Pulm: unlabored breathing Abd: soft, NT GU: clear yellow urine Extr: wwp, no edema   Results for orders placed or performed during the hospital encounter of 07/02/20 (from the past 24 hour(s))  Basic metabolic panel     Status: Abnormal   Collection Time: 07/15/20  3:36 AM  Result Value Ref Range   Sodium 134 (L) 135 - 145 mmol/L   Potassium 3.9 3.5 - 5.1 mmol/L   Chloride 100 98 - 111 mmol/L   CO2 25 22 - 32 mmol/L   Glucose, Bld 122 (H) 70 - 99 mg/dL   BUN 7 6 - 20 mg/dL   Creatinine, Ser 7.82 (L) 0.61 - 1.24 mg/dL   Calcium 9.6 8.9 - 42.3 mg/dL   GFR, Estimated >53 >61 mL/min   Anion gap 9 5 - 15  CBC     Status: Abnormal   Collection Time: 07/15/20  3:36 AM  Result Value Ref Range   WBC 8.5 4.0 - 10.5 K/uL   RBC 3.84 (L) 4.22 - 5.81 MIL/uL   Hemoglobin 12.6 (L) 13.0 - 17.0 g/dL   HCT 44.3 (L) 15.4 - 00.8 %   MCV 98.2 80.0 - 100.0 fL   MCH 32.8 26.0 - 34.0 pg   MCHC 33.4 30.0 - 36.0 g/dL   RDW 67.6 19.5 - 09.3 %   Platelets 470 (H) 150 - 400 K/uL   nRBC 0.0 0.0 - 0.2 %    Assessment & Plan:  Present on Admission:  Fall    LOS: 13 days   Additional comments:I reviewed the patient's new clinical lab test results.   and I reviewed the  patients new imaging test results.    Fall   SAH - NSGY c/s, Dr. 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 Conchita Paris, TLSO brace for now, assess mobility/pain control in brace, MRI T-spine 6/17 T7 endplate fracture - NSGY c/s, Dr. Conchita Paris, pain control, already in TLSO brace, F/U outpatient in 2 weeks with repeat x-rays Cervicalgia - cont c-collar, MRI 6/17 noted Right first rib fx - EKG, pain control, pulm toilet EtOH abuse - TOC c/s, CIWA, beer with meals, thiamine/folate, seroquel 25mg  BID. Right scapular fracture, R clavicle fx - ortho c/s, Dr. , sling for now, final recs pending Elevated LFTs - monitor Questionable syncope - echo 6/17 LVEF 65-70% no abnormal wall motion/normal valves, carotid duplex could not be completed as he would not hold still, EEG 6/17 without abnormality FEN - reg diet, check lytes DVT - SCDs, LMWH Dispo - 4NP    7/17, MD Trauma & General Surgery Please use [email or web address] to contact on call provider  07/15/2020  *Care  during the described time interval was provided by me. I have reviewed this patient's available data, including medical history, events of note, physical examination and test results as part of my evaluation.

## 2020-07-16 LAB — CBC
HCT: 36.6 % — ABNORMAL LOW (ref 39.0–52.0)
Hemoglobin: 11.9 g/dL — ABNORMAL LOW (ref 13.0–17.0)
MCH: 32.4 pg (ref 26.0–34.0)
MCHC: 32.5 g/dL (ref 30.0–36.0)
MCV: 99.7 fL (ref 80.0–100.0)
Platelets: 497 10*3/uL — ABNORMAL HIGH (ref 150–400)
RBC: 3.67 MIL/uL — ABNORMAL LOW (ref 4.22–5.81)
RDW: 14.8 % (ref 11.5–15.5)
WBC: 7.7 10*3/uL (ref 4.0–10.5)
nRBC: 0 % (ref 0.0–0.2)

## 2020-07-16 LAB — BASIC METABOLIC PANEL
Anion gap: 10 (ref 5–15)
BUN: 7 mg/dL (ref 6–20)
CO2: 25 mmol/L (ref 22–32)
Calcium: 9.4 mg/dL (ref 8.9–10.3)
Chloride: 103 mmol/L (ref 98–111)
Creatinine, Ser: 0.65 mg/dL (ref 0.61–1.24)
GFR, Estimated: >60 mL/min (ref >60)
Glucose, Bld: 83 mg/dL (ref 70–99)
Potassium: 3.9 mmol/L (ref 3.5–5.1)
Sodium: 138 mmol/L (ref 135–145)

## 2020-07-16 NOTE — Progress Notes (Signed)
Patient ID: Samuel [name], male   DOB: 1970/08/21, 50 y.o.   MRN: [id number]     Subjective: Wants to take a shower C/O some R shoulder pain ROS negative except as listed above. Objective: Vital signs in last 24 hours: Temp:  [98.1 F (36.7 C)-98.9 F (37.2 C)] 98.6 F (37 C) (06/30 0816) Pulse Rate:  [81-98] 98 (06/30 0816) Resp:  [14-16] 14 (06/30 0816) BP: (109-130)/(76-95) 123/87 (06/30 0816) SpO2:  [97 %-100 %] 99 % (06/30 0816) Last BM Date: 07/15/20  Intake/Output from previous day: 06/29 0701 - 06/30 0700 In: 1580 [P.O.:1580] Out: 1300 [Urine:1300] Intake/Output this shift: Total I/O In: 500 [P.O.:500] Out: -   General appearance: cooperative Resp: clear to auscultation bilaterally Cardio: regular rate and rhythm GI: soft, NT Extremities: calves soft Neurologic: Mental status: more clear, F/C  Lab Results: CBC  Recent Labs    07/15/20 0336 07/16/20 0340  WBC 8.5 7.7  HGB 12.6* 11.9*  HCT 37.7* 36.6*  PLT 470* 497*   BMET Recent Labs    07/15/20 0336 07/16/20 0340  NA 134* 138  K 3.9 3.9  CL 100 103  CO2 25 25  GLUCOSE 122* 83  BUN 7 7  CREATININE 0.55* 0.65  CALCIUM 9.6 9.4   PT/INR No results for input(s): LABPROT, INR in the last 72 hours. ABG No results for input(s): PHART, HCO3 in the last 72 hours.  Invalid input(s): PCO2, PO2  Studies/Results: No results found.  Anti-infectives: Anti-infectives (From admission, onward)    None       Assessment/Plan: [facility] - NSGY c/s, Dr. 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 Conchita Paris, TLSO brace for now, encouraged compliance with brace, MRI T-spine 6/17 T7 endplate fracture - NSGY c/s, Dr. Conchita Paris, pain control, already in TLSO brace, F/U outpatient in 2 weeks with repeat x-rays Cervicalgia - cont c-collar, MRI 6/17 noted Right first rib fx - EKG, pain control, pulm toilet EtOH  abuse - TOC c/s, CIWA, beer with meals, thiamine/folate, seroquel 25mg  BID. Right scapular fracture, R clavicle fx - ortho c/s, Dr. , sling  Elevated LFTs - monitor Questionable syncope - echo 6/17 LVEF 65-70% no abnormal wall motion/normal valves, carotid duplex could not be completed as he would not hold still, EEG 6/17 without abnormality FEN - reg diet, check lytes DVT - SCDs, LMWH Dispo - 4NP. Out of [place] bed. Plan D/C to shelter once MS improves   LOS: 14 days    [place], MD, MPH, FACS Trauma & General Surgery Use [email or web address] to contact on call provider  07/16/2020

## 2020-07-16 NOTE — TOC Progression Note (Signed)
Transition of Care [facility]) - Progression Note    Patient Details  Name: Efren Maximo Spratling MRN: [id number] Date of Birth: 1970-06-13  Transition of Care [facility]) CM/SW [place], [place] Phone Number: 07/16/2020, 12:33 PM  Clinical Narrative:     CSW met with patient and Medical Spanish Interpreter Graciela to assist patient w/finding family. CSW and Spero Geralds attempted to reach patient's  niece in [place]- number was not in service.   Donnie Coffin spoke Margoht-she advised- she was able to reach family and they said they were coming to visit patient today.   CSW informed Nurse Secretary and RN to contact CSW if patient receives any visitors today.  Thurmond Butts, MSW, LCSW Clinical Social Worker    Expected Discharge Plan: IP Rehab Facility Barriers to Discharge: Continued Medical Work up  Expected Discharge Plan and Services Expected Discharge Plan: [place]   Discharge Planning Services: CM Consult   Living arrangements for the past 2 months: Homeless                                       Social Determinants of Health (SDOH) Interventions    Readmission Risk Interventions No flowsheet data found.

## 2020-07-16 NOTE — Progress Notes (Deleted)
   Subjective/Chief Complaint: Pt doing well  Working with PT No abd pain   Objective: Vital signs in last 24 hours: Temp:  [98.1 F (36.7 C)-98.9 F (37.2 C)] 98.1 F (36.7 C) (06/30 0324) Pulse Rate:  [81-98] 87 (06/30 0324) Resp:  [15-16] 16 (06/30 0324) BP: (106-130)/(65-95) 115/87 (06/30 0324) SpO2:  [97 %-100 %] 100 % (06/30 0324) Last BM Date: 07/15/20  Intake/Output from previous day: 06/29 0701 - 06/30 0700 In: 1580 [P.O.:1580] Out: 1300 [Urine:1300] Intake/Output this shift: No intake/output data recorded.  PE:  Constitutional: No acute distress, conversant, appears states age. Eyes: Anicteric sclerae, moist conjunctiva, no lid lag Lungs: Clear to auscultation bilaterally, normal respiratory effort CV: regular rate and rhythm, no murmurs, no peripheral edema, pedal pulses 2+ GI: Soft, no masses or hepatosplenomegaly, non-tender to palpation Skin: No rashes, palpation reveals normal turgor Psychiatric: appropriate judgment and insight, oriented to person, place, and time Ext: 1/5 RLE strength  Lab Results:  Recent Labs    07/15/20 0336 07/16/20 0340  WBC 8.5 7.7  HGB 12.6* 11.9*  HCT 37.7* 36.6*  PLT 470* 497*   BMET Recent Labs    07/15/20 0336 07/16/20 0340  NA 134* 138  K 3.9 3.9  CL 100 103  CO2 25 25  GLUCOSE 122* 83  BUN 7 7  CREATININE 0.55* 0.65  CALCIUM 9.6 9.4   . Assessment/Plan: [facility] - NSGY c/s, Dr. 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 Conchita Paris, TLSO brace for now, assess mobility/pain control in brace, MRI T-spine 6/17 T7 endplate fracture - NSGY c/s, Dr. Conchita Paris, pain control, already in TLSO brace, F/U outpatient in 2 weeks with repeat x-rays Cervicalgia - cont c-collar, MRI 6/17 noted Right first rib fx - EKG, pain control, pulm toilet EtOH abuse - TOC c/s, CIWA, beer with meals, thiamine/folate, seroquel 25mg  BID. Right scapular  fracture, R clavicle fx - ortho c/s, Dr. , sling for now, final recs pending Elevated LFTs - monitor Questionable syncope - echo 6/17 LVEF 65-70% no abnormal wall motion/normal valves, carotid duplex could not be completed as he would not hold still, EEG 6/17 without abnormality FEN - reg diet, check lytes DVT - SCDs, LMWH  Dispo - 6N; PT rec SNF   LOS: 14 days    7/17 07/16/2020

## 2020-07-16 NOTE — TOC Progression Note (Signed)
Transition of Care [facility]) - Progression Note    Patient Details  Name: Samuel [name] MRN: [id number] Date of Birth: 12-29-1970  Transition of Care [facility]) CM/SW Contact  Eduard Roux, [name] Phone Number: 07/16/2020, 2:59 PM  Clinical Narrative:     Patient received visitor today( w/ Interpreter Catalina Pizza)  - Liferta Shirley Friar # [phone]- she informed CSW the patient's brother in [place] sent her message through Facebook and request she come and visit and discuss with the patient about him returning to [place]. Patient  informed CSW and his visitor that he was not returning to [place], he works here and everyone knows him here. He states his friend Bernerd Pho is coming to visit him and he can go stay with her, although CSW was advised by [place]( interpreter that spoke with her ) states he can not.   Patient states he has no ID-  Patient declined family assistance to return to [place] Liferta- will call Garciela with brother's name and contact number  At this time no friend or family has agreed to provide or be responsible for the level of care that is needed for patient at this time. Hopefully, TOC will be provided with actual family information that can help with developing a reasonable plan at discharge.   TOC will continue to follow and assist with discharge plan.  Expected Discharge Plan: IP Rehab Facility Barriers to Discharge: Continued Medical Work up  Expected Discharge Plan and Services Expected Discharge Plan: IP Rehab Facility   Discharge Planning Services: CM Consult   Living arrangements for the past 2 months: Homeless                                       Social Determinants of Health (SDOH) Interventions    Readmission Risk Interventions No flowsheet data found.

## 2020-07-17 LAB — BASIC METABOLIC PANEL
Anion gap: 6 (ref 5–15)
BUN: 10 mg/dL (ref 6–20)
CO2: 28 mmol/L (ref 22–32)
Calcium: 9 mg/dL (ref 8.9–10.3)
Chloride: 103 mmol/L (ref 98–111)
Creatinine, Ser: 0.68 mg/dL (ref 0.61–1.24)
GFR, Estimated: >60 mL/min (ref >60)
Glucose, Bld: 96 mg/dL (ref 70–99)
Potassium: 3.9 mmol/L (ref 3.5–5.1)
Sodium: 137 mmol/L (ref 135–145)

## 2020-07-17 LAB — CBC
HCT: 34.1 % — ABNORMAL LOW (ref 39.0–52.0)
Hemoglobin: 11.3 g/dL — ABNORMAL LOW (ref 13.0–17.0)
MCH: 33 pg (ref 26.0–34.0)
MCHC: 33.1 g/dL (ref 30.0–36.0)
MCV: 99.7 fL (ref 80.0–100.0)
Platelets: 450 10*3/uL — ABNORMAL HIGH (ref 150–400)
RBC: 3.42 MIL/uL — ABNORMAL LOW (ref 4.22–5.81)
RDW: 14.6 % (ref 11.5–15.5)
WBC: 7.8 10*3/uL (ref 4.0–10.5)
nRBC: 0 % (ref 0.0–0.2)

## 2020-07-17 MED ORDER — SPIRITUS FRUMENTI
1.0000 | Freq: Two times a day (BID) | ORAL | Status: DC
  Administered 2020-07-17 – 2020-07-18 (×2): 1 via ORAL
  Filled 2020-07-17 (×2): qty 1

## 2020-07-17 NOTE — Progress Notes (Addendum)
Trauma/Critical Care Follow Up Note  Subjective:    Overnight Issues:   Objective:  Vital signs for last 24 hours: Temp:  [97.9 F (36.6 C)-99 F (37.2 C)] 98.9 F (37.2 C) (07/01 0803) Pulse Rate:  [79-109] 99 (07/01 0803) Resp:  [16] 16 (07/01 0803) BP: (102-119)/(65-80) 119/80 (07/01 0803) SpO2:  [97 %-100 %] 98 % (07/01 0803)  Hemodynamic parameters for last 24 hours:    Intake/Output from previous day: 06/30 0701 - 07/01 0700 In: 500 [P.O.:500] Out: 600 [Urine:600]  Intake/Output this shift: Total I/O In: 400 [P.O.:400] Out: -   Vent settings for last 24 hours:    Physical Exam:  Gen: comfortable, no distress Neuro: non-focal exam HEENT: PERRL Neck: supple CV: RRR Pulm: unlabored breathing Abd: soft, NT GU: clear yellow urine Extr: wwp, no edema   Results for orders placed or performed during the hospital encounter of 07/02/20 (from the past 24 hour(s))  Basic metabolic panel     Status: None   Collection Time: 07/17/20  2:42 AM  Result Value Ref Range   Sodium 137 135 - 145 mmol/L   Potassium 3.9 3.5 - 5.1 mmol/L   Chloride 103 98 - 111 mmol/L   CO2 28 22 - 32 mmol/L   Glucose, Bld 96 70 - 99 mg/dL   BUN 10 6 - 20 mg/dL   Creatinine, Ser 7.25 0.61 - 1.24 mg/dL   Calcium 9.0 8.9 - 36.6 mg/dL   GFR, Estimated >44 >03 mL/min   Anion gap 6 5 - 15  CBC     Status: Abnormal   Collection Time: 07/17/20  2:42 AM  Result Value Ref Range   WBC 7.8 4.0 - 10.5 K/uL   RBC 3.42 (L) 4.22 - 5.81 MIL/uL   Hemoglobin 11.3 (L) 13.0 - 17.0 g/dL   HCT 47.4 (L) 25.9 - 56.3 %   MCV 99.7 80.0 - 100.0 fL   MCH 33.0 26.0 - 34.0 pg   MCHC 33.1 30.0 - 36.0 g/dL   RDW 87.5 64.3 - 32.9 %   Platelets 450 (H) 150 - 400 K/uL   nRBC 0.0 0.0 - 0.2 %    Assessment & Plan:  Present on Admission:  Fall    LOS: 15 days   Additional comments:I reviewed the patient's new clinical lab test results.   and I reviewed the patients new imaging test results.    Fall    SAH - NSGY c/s, Dr. 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 Conchita Paris, TLSO brace for now, encouraged compliance with brace, MRI T-spine 6/17 T7 endplate fracture - NSGY c/s, Dr. Conchita Paris, pain control, already in TLSO brace, F/U outpatient in 2 weeks with repeat x-rays Cervicalgia - cont c-collar, MRI 6/17 noted Right first rib fx - EKG, pain control, pulm toilet EtOH abuse - TOC c/s, CIWA, beer with meals, thiamine/folate, seroquel 25mg  BID-d/c today Right scapular fracture, R clavicle fx - ortho c/s, Dr. , sling Elevated LFTs - monitor Questionable syncope - echo 6/17 LVEF 65-70% no abnormal wall motion/normal valves, carotid duplex could not be completed as he would not hold still, EEG 6/17 without abnormality FEN - reg diet, wean beer today DVT - SCDs, LMWH Dispo - 4NP. Out of [place] bed. Plan D/C to shelter once MS improves  [place], MD Trauma & General Surgery Please use [email or web address] to contact on call provider  07/17/2020  *  Care during the described time interval was provided by me. I have reviewed this patient's available data, including medical history, events of note, physical examination and test results as part of my evaluation.

## 2020-07-17 NOTE — Progress Notes (Signed)
Occupational Therapy Treatment Patient Details Name: Samuel [name] MRN: [id number] DOB: Oct 24, 1970 Today's Date: 07/17/2020    History of present illness 50 y.o. male found down outside his house - fall vs syncope.. Pt with T10, T 7 spinal fxs, cerivcalgia, R 1st, 3rd, 5th rib fx, R scapula and clavicle fx with questionable syncope, TBI with SAH; ETOH abuse - on CIWA.   OT comments  Translation via SLP : Pt completed bed mobility, LB dressing and sink level grooming this session with cues for back precautions / R UE NWB. Pt demonstrates behavior more consistent with Rancho level VII this session. Pt continues to need functional cues to adhering to back precautions but verbalized 100% correctly. Recommendation SNF pending family (A) at d/c. Pt with slower recovery from a cognitive understanding of precautions than mobility.    Follow Up Recommendations  SNF    Equipment Recommendations  Tub/shower seat    Recommendations for Other Services      Precautions / Restrictions Precautions Precautions: Fall;Back;Shoulder Type of Shoulder Precautions: RUE NWB at this time/ sling for comfort per PA 07/17/20 in secure chat Shoulder Interventions: Shoulder sling/immobilizer Precaution Comments: Cervical collar d/c'ed 6/27 per Nundkumar Required Braces or Orthoses: Spinal Brace Spinal Brace: Thoracolumbosacral orthotic;Applied in standing position (as pt stood up prior to ability to position sitting) Restrictions RUE Weight Bearing: Non weight bearing       Mobility Bed Mobility Overal bed mobility: Modified Independent                  Transfers Overall transfer level: Independent                    Balance                                           ADL either performed or assessed with clinical judgement   ADL Overall ADL's : Needs assistance/impaired Eating/Feeding: Modified independent;Sitting   Grooming: Wash/dry hands;Wash/dry  face;Oral care;Minimal assistance;Standing;Adhering to UE precautions;Cueing for UE precautions (shaving) Grooming Details (indicate cue type and reason): pt stopped when making an error and asked to problem solve what is being done incorrectly and how to correct             Lower Body Dressing: Min guard;Sit to/from stand;Cueing for back precautions Lower Body Dressing Details (indicate cue type and reason): educated on don of shoes in sitting with figure 4 with cues to errors. pt attempting to bend over even after being able to verbalize no bending when just asked precautions. Functional use of precautions continues to require reinforcement             Functional mobility during ADLs: Moderate assistance General ADL Comments: pt with good return carry over during session to precautions with SLP translating / cognition compensatory strategies and OT giving functional task with cues for cognition.     Vision       Perception     Praxis      Cognition Arousal/Alertness: Awake/alert Behavior During Therapy: WFL for tasks assessed/performed Overall Cognitive Status: Impaired/Different from baseline Area of Impairment: Awareness                     Memory: Decreased recall of precautions (in functional use)   Safety/Judgement: Decreased awareness of deficits  Exercises     Shoulder Instructions       General Comments pt requires max (A) don TLSO and pt requires mod cues for don of Sling. after session was able to clarfiy via secure chat sling is comfort only and given info to patient    Pertinent Vitals/ Pain       Pain Assessment: No/denies pain  Home Living                                          Prior Functioning/Environment              Frequency  Min 2X/week        Progress Toward Goals  OT Goals(current goals can now be found in the care plan section)  Progress towards OT goals: Progressing toward  goals  Acute Rehab OT Goals Patient Stated Goal: to shave with razor to shorter beard OT Goal Formulation: With patient Potential to Achieve Goals: Good ADL Goals Pt Will Perform Grooming: with supervision;standing Pt Will Perform Upper Body Bathing: with min assist;sitting Pt Will Perform Lower Body Bathing: with supervision;with adaptive equipment;sit to/from stand Pt Will Perform Upper Body Dressing: with modified independence;sitting Pt Will Perform Lower Body Dressing: with modified independence;sit to/from stand;with adaptive equipment Pt Will Transfer to Toilet: with modified independence;ambulating Additional ADL Goal #1: Pt will independnelty verbalize understanding of precautions Additional ADL Goal #2: Pt will manage back brace min A  Plan Discharge plan needs to be updated    Co-evaluation    PT/OT/SLP Co-Evaluation/Treatment: Yes     OT goals addressed during session: ADL's and self-care;Proper use of Adaptive equipment and DME      AM-PAC OT "6 Clicks" Daily Activity     Outcome Measure   Help from another person eating meals?: None Help from another person taking care of personal grooming?: None Help from another person toileting, which includes using toliet, bedpan, or urinal?: A Little Help from another person bathing (including washing, rinsing, drying)?: A Little Help from another person to put on and taking off regular upper body clothing?: A Little Help from another person to put on and taking off regular lower body clothing?: A Little 6 Click Score: 20    End of Session Equipment Utilized During Treatment: Back brace;Other (comment) (sling R UE)  OT Visit Diagnosis: Unsteadiness on feet (R26.81);Other abnormalities of gait and mobility (R26.89);Muscle weakness (generalized) (M62.81);History of falling (Z91.81);Other symptoms and signs involving cognitive function;Dizziness and giddiness (R42);Pain Pain - Right/Left: Right Pain - part of body:  Arm;Shoulder   Activity Tolerance Patient tolerated treatment well   Patient Left Other (comment) (walking with SLP)   Nurse Communication Mobility status;Precautions;Weight bearing status        Time: 1109-1150 OT Time Calculation (min): 41 min  Charges: OT General Charges $OT Visit: 1 Visit OT Treatments $Self Care/Home Management : 38-52 mins   Brynn, OTR/L  Acute Rehabilitation Services Pager: [phone] Office: [phone] .    Mateo Flow 07/17/2020, 12:06 PM

## 2020-07-17 NOTE — Progress Notes (Addendum)
  Speech Language Pathology Treatment: Cognitive-Linquistic  Patient Details Name: Samuel [name] MRN: [id number] DOB: 22-Feb-1970 Today's Date: 07/17/2020 Time: 1100-1150 SLP Time Calculation (min) (ACUTE ONLY): 50 min  Assessment / Plan / Recommendation Clinical Impression  Co treat with OT to address memory/awareness during ADLs. Pt present with function consistent with a Rancho VII (automatic/appropriate),  able to verbalize orientation to place and situation at beginning of session. Intellectual awareness of injuries verbalized by pt repeatedly during session. Emergent awareness of deficits requires min verbal cues in 80% of opportunities presented. After given demonstration of problem solving mobility restrictions (no twisting and no bending over) pt demonstrated some attempts to carry over during other functional tasks though moderate verbal cueing needed for success. Pt unable to demonstrate short term memory adequate for way finding return through hospital to room, though he did recall the numbers of the floors. Pt needed min verbal cues to avoid running into things on the right. Again, this pt has a low education level, history of severe alcoholism as well as, per his report, injuries that have likely resulted in prior brain injury. He is very willing to participate, but his capacity to achieve safe self sufficiency may be limited. Will continue efforts however as pt did demonstrate progress today.    HPI HPI: 50 y.o. male found down outside his house - fall vs syncope.. Pt with T10, T 7 spinal fxs, cerivcalgia, R first rib fx, R scapula and clavicle fxs (sling and NWB - pending work up), questionable syncope, TBI with SAH; ETOH abuse - on CIWA.      SLP Plan  Continue with current plan of care       Recommendations                   Oral Care Recommendations: Oral care BID Follow up Recommendations: 24 hour supervision/assistance Plan: Continue with current plan  of care       GO                Samuel [name], [name] 07/17/2020, 1:22 PM

## 2020-07-18 LAB — CBC
HCT: 33.8 % — ABNORMAL LOW (ref 39.0–52.0)
Hemoglobin: 11.4 g/dL — ABNORMAL LOW (ref 13.0–17.0)
MCH: 33 pg (ref 26.0–34.0)
MCHC: 33.7 g/dL (ref 30.0–36.0)
MCV: 98 fL (ref 80.0–100.0)
Platelets: 439 10*3/uL — ABNORMAL HIGH (ref 150–400)
RBC: 3.45 MIL/uL — ABNORMAL LOW (ref 4.22–5.81)
RDW: 14.3 % (ref 11.5–15.5)
WBC: 8.1 10*3/uL (ref 4.0–10.5)
nRBC: 0 % (ref 0.0–0.2)

## 2020-07-18 LAB — BASIC METABOLIC PANEL
Anion gap: 9 (ref 5–15)
BUN: 10 mg/dL (ref 6–20)
CO2: 24 mmol/L (ref 22–32)
Calcium: 9.2 mg/dL (ref 8.9–10.3)
Chloride: 100 mmol/L (ref 98–111)
Creatinine, Ser: 0.62 mg/dL (ref 0.61–1.24)
GFR, Estimated: >60 mL/min (ref >60)
Glucose, Bld: 97 mg/dL (ref 70–99)
Potassium: 3.9 mmol/L (ref 3.5–5.1)
Sodium: 133 mmol/L — ABNORMAL LOW (ref 135–145)

## 2020-07-18 MED ORDER — OXYCODONE HCL 5 MG PO TABS
5.0000 mg | ORAL_TABLET | Freq: Four times a day (QID) | ORAL | Status: DC | PRN
  Administered 2020-07-18 – 2020-07-19 (×3): 10 mg via ORAL
  Filled 2020-07-18 (×3): qty 2

## 2020-07-18 MED ORDER — SPIRITUS FRUMENTI
1.0000 | Freq: Every day | ORAL | Status: DC
  Administered 2020-07-19 – 2020-07-20 (×2): 1 via ORAL
  Filled 2020-07-18 (×3): qty 1

## 2020-07-18 NOTE — Progress Notes (Signed)
Pt transferred to 5N07. Report previously called to Earle, Charity fundraiser. This RN used interpreter to explain to pt the reason for his transfer. Pt had no questions. He did say that  he was feeling fine and that when the "boss says to get back to work he'll be ready to get to work." Explained to the pt that he will be in the hospital a little longer until we find somewhere for him to go. Personal hygiene items packed, pt wearing jeans and boots, brace and sling when transferred. No other personal items in room. No family updated on transfer.   Robina Ade, RN

## 2020-07-18 NOTE — Progress Notes (Signed)
Trauma/Critical Care Follow Up Note  Subjective:    Overnight Issues:   Objective:  Vital signs for last 24 hours: Temp:  [98.2 F (36.8 C)-99 F (37.2 C)] 98.6 F (37 C) (07/02 0812) Pulse Rate:  [74-88] 81 (07/02 0812) Resp:  [14-20] 16 (07/02 0812) BP: (114-129)/(77-96) 121/80 (07/02 0812) SpO2:  [97 %-100 %] 100 % (07/02 0812)  Hemodynamic parameters for last 24 hours:    Intake/Output from previous day: 07/01 0701 - 07/02 0700 In: 1230 [P.O.:1230] Out: -   Intake/Output this shift: No intake/output data recorded.  Vent settings for last 24 hours:    Physical Exam:  Gen: comfortable, no distress Neuro: non-focal exam HEENT: PERRL Neck: supple CV: RRR Pulm: unlabored breathing Abd: soft, NT GU: clear yellow urine Extr: wwp, no edema   Results for orders placed or performed during the hospital encounter of 07/02/20 (from the past 24 hour(s))  Basic metabolic panel     Status: Abnormal   Collection Time: 07/18/20 12:21 AM  Result Value Ref Range   Sodium 133 (L) 135 - 145 mmol/L   Potassium 3.9 3.5 - 5.1 mmol/L   Chloride 100 98 - 111 mmol/L   CO2 24 22 - 32 mmol/L   Glucose, Bld 97 70 - 99 mg/dL   BUN 10 6 - 20 mg/dL   Creatinine, Ser 7.12 0.61 - 1.24 mg/dL   Calcium 9.2 8.9 - 45.8 mg/dL   GFR, Estimated >09 >98 mL/min   Anion gap 9 5 - 15  CBC     Status: Abnormal   Collection Time: 07/18/20 12:21 AM  Result Value Ref Range   WBC 8.1 4.0 - 10.5 K/uL   RBC 3.45 (L) 4.22 - 5.81 MIL/uL   Hemoglobin 11.4 (L) 13.0 - 17.0 g/dL   HCT 33.8 (L) 25.0 - 53.9 %   MCV 98.0 80.0 - 100.0 fL   MCH 33.0 26.0 - 34.0 pg   MCHC 33.7 30.0 - 36.0 g/dL   RDW 76.7 34.1 - 93.7 %   Platelets 439 (H) 150 - 400 K/uL   nRBC 0.0 0.0 - 0.2 %    Assessment & Plan:  Present on Admission:  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    LOS: 16 days   Additional comments:I reviewed the patient's new clinical lab test results.   and I reviewed the patients new imaging test  results.    Fall   SAH - NSGY c/s, Dr. 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 Conchita Paris, TLSO brace for now, encouraged compliance with brace, MRI T-spine 6/17 T7 endplate fracture - NSGY c/s, Dr. Conchita Paris, pain control, already in TLSO brace, F/U outpatient in 2 weeks with repeat x-rays Cervicalgia - cont c-collar if needed, MRI 6/17 noted to be free of acute injuries. Right first rib fx - pain control, pulm toilet EtOH abuse - TOC c/s, CIWA, beer with meals, thiamine/folate, seroquel taper ended yesterday.  Right scapular fracture, R clavicle fx - ortho c/s, Dr. Magnus Ivan, sling Elevated LFTs - monitor, recheck again as it has been 2 weeks.   Questionable syncope - echo 6/17 LVEF 65-70% no abnormal wall motion/normal valves, carotid duplex could not be completed as he would not hold still, EEG 6/17 without abnormality FEN - reg diet, continue to wean beer DVT - SCDs, LMWH Dispo - transfer to floor. Plan D/C to shelter once MS improves   Maudry Diego,  MD FACS Surgical Oncology, General Surgery, Trauma and Critical Care [facility], [place] [phone] for weekday/non holidays Check [email or web address] for coverage night/weekend/holidays  Do not use SecureChat as it is not reliable for timely patient care.     07/18/2020  *Care during the described time interval was provided by me. I have reviewed this patient's available data, including medical history, events of note, physical examination and test results as part of my evaluation.

## 2020-07-18 NOTE — Progress Notes (Signed)
Patient arrived to unit. Spanish interpreter called to go over rules, requirements and plan of care. Patient had no questions. AOx3. Ambulatory. Minimal pain in right shoulder area. Call bell within reach. Bed alarm on.

## 2020-07-19 LAB — GLUCOSE, CAPILLARY: Glucose-Capillary: 173 mg/dL — ABNORMAL HIGH (ref 70–99)

## 2020-07-19 NOTE — Progress Notes (Signed)
   Subjective/Chief Complaint: No new complaints No issues since transfer to floor   Objective: Vital signs in last 24 hours: Temp:  [98.5 F (36.9 C)-98.7 F (37.1 C)] 98.7 F (37.1 C) (07/03 0806) Pulse Rate:  [82-91] 90 (07/03 0806) Resp:  [16-17] 16 (07/03 0806) BP: (124-132)/(84-94) 126/88 (07/03 0806) SpO2:  [98 %-100 %] 99 % (07/03 0806) Last BM Date: 08/14/20  Intake/Output from previous day: No intake/output data recorded. Intake/Output this shift: Total I/O In: 240 [P.O.:240] Out: -   Exam: Gen: comfortable, no distress Neuro: non-focal exam, follows commands Neck: supple CV: RRR Pulm: unlabored breathing Abd: soft, NT Extr: wwp, no edema   Lab Results:  Recent Labs    07/17/20 0242 07/18/20 0021  WBC 7.8 8.1  HGB 11.3* 11.4*  HCT 34.1* 33.8*  PLT 450* 439*   BMET Recent Labs    07/17/20 0242 07/18/20 0021  NA 137 133*  K 3.9 3.9  CL 103 100  CO2 28 24  GLUCOSE 96 97  BUN 10 10  CREATININE 0.68 0.62  CALCIUM 9.0 9.2   PT/INR No results for input(s): LABPROT, INR in the last 72 hours. ABG No results for input(s): PHART, HCO3 in the last 72 hours.  Invalid input(s): PCO2, PO2  Studies/Results: No results found.  Anti-infectives: Anti-infectives (From admission, onward)    None       Assessment/Plan: [facility] - NSGY c/s, Dr. 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 Conchita Paris, TLSO brace for now, encouraged compliance with brace, MRI T-spine 6/17 T7 endplate fracture - NSGY c/s, Dr. Conchita Paris, pain control, already in TLSO brace, F/U outpatient in 2 weeks with repeat x-rays Cervicalgia - cont c-collar if needed, MRI 6/17 noted to be free of acute injuries. Right first rib fx - pain control, pulm toilet EtOH abuse - TOC c/s, CIWA, beer with meals, thiamine/folate, seroquel taper ended yesterday.  Right scapular fracture, R clavicle fx - ortho c/s,  Dr. Magnus Ivan, sling Elevated LFTs - monitor, recheck again as it has been 2 weeks.   Questionable syncope - echo 6/17 LVEF 65-70% no abnormal wall motion/normal valves, carotid duplex could not be completed as he would not hold still, EEG 6/17 without abnormality FEN - reg diet, continue to wean beer DVT - SCDs, LMWH Dispo - Plan D/C to shelter once MS improves  Abigail Miyamoto 07/19/2020

## 2020-07-20 NOTE — Progress Notes (Signed)
   Subjective/Chief Complaint: No complaints    Objective: Vital signs in last 24 hours: Temp:  [98.8 F (37.1 C)-98.9 F (37.2 C)] 98.9 F (37.2 C) (07/04 0749) Pulse Rate:  [78-85] 85 (07/04 0749) Resp:  [18] 18 (07/04 0749) BP: (116-130)/(79-95) 116/79 (07/04 0749) SpO2:  [98 %-100 %] 98 % (07/04 0749) Last BM Date: 07/18/20  Intake/Output from previous day: 07/03 0701 - 07/04 0700 In: 240 [P.O.:240] Out: -  Intake/Output this shift: No intake/output data recorded.  Exam: Awake and alert Follows commands Neuro grossly intact Lungs clear Abdomen soft, NT  Lab Results:  Recent Labs    07/18/20 0021  WBC 8.1  HGB 11.4*  HCT 33.8*  PLT 439*   BMET Recent Labs    07/18/20 0021  NA 133*  K 3.9  CL 100  CO2 24  GLUCOSE 97  BUN 10  CREATININE 0.62  CALCIUM 9.2   PT/INR No results for input(s): LABPROT, INR in the last 72 hours. ABG No results for input(s): PHART, HCO3 in the last 72 hours.  Invalid input(s): PCO2, PO2  Studies/Results: No results found.  Anti-infectives: Anti-infectives (From admission, onward)    None       Assessment/Plan: [facility] - NSGY c/s, Dr. 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 Conchita Paris, TLSO brace for now, encouraged compliance with brace, MRI T-spine 6/17 T7 endplate fracture - NSGY c/s, Dr. Conchita Paris, pain control, already in TLSO brace, F/U outpatient in 2 weeks with repeat x-rays Cervicalgia - cont c-collar if needed, MRI 6/17 noted to be free of acute injuries. Right first rib fx - pain control, pulm toilet EtOH abuse - TOC c/s, CIWA, down to 1 beer daily Right scapular fracture, R clavicle fx - ortho c/s, Dr. Allie Bossier, sling Questionable syncope - echo 6/17 LVEF 65-70% no abnormal wall motion/normal valves, carotid duplex could not be completed as he would not hold still, EEG 6/17 without abnormality FEN - reg  diet, DVT - SCDs, LMWH Dispo - mental status seems normal.  D/C planning.  Needs a place to go.   Abigail Miyamoto MD 07/20/2020

## 2020-07-20 NOTE — Progress Notes (Signed)
Physical Therapy Treatment Patient Details Name: Samuel [name] MRN: [id number] DOB: 19-Aug-1970 Today's Date: 07/20/2020    History of Present Illness 50 y.o. male found down outside his house - fall vs syncope.. Pt with T10, T 7 spinal fxs, cerivcalgia, R 1st, 3rd, 5th rib fx, R scapula and clavicle fx with questionable syncope, TBI with SAH; ETOH abuse - on CIWA.    PT Comments    Pt admitted with above diagnosis. Pt continues to progress with mobility however needs cues for safety as he forgets his UE precautions and still needs steadying assist with challenges to balance. Also needs assist with donning brace. Will continue to follow acutely.  Pt currently with functional limitations due to balance and endurance deficits. Pt will benefit from skilled PT to increase their independence and safety with mobility to allow discharge to the venue listed below.      Follow Up Recommendations  SNF;Supervision for mobility/OOB     Equipment Recommendations  None recommended by PT    Recommendations for Other Services       Precautions / Restrictions Precautions Precautions: Fall;Back;Shoulder Type of Shoulder Precautions: RUE NWB at this time/ sling for comfort per PA 07/17/20 in secure chat Shoulder Interventions: Shoulder sling/immobilizer Precaution Booklet Issued: No Precaution Comments: Cervical collar d/c'ed 6/27 per Nundkumar Required Braces or Orthoses: Spinal Brace Cervical Brace: Hard collar;At all times Spinal Brace: Thoracolumbosacral orthotic;Applied in standing position (as pt stood up prior to ability to position sitting) Restrictions Weight Bearing Restrictions: Yes RUE Weight Bearing: Non weight bearing Other Position/Activity Restrictions: sling for comfort only however if pt does not wear it, he uses UE and even when on, pt will forget and use UE    Mobility  Bed Mobility         Supine to sit: Modified independent (Device/Increase time)      General bed mobility comments: Pt needed cues to not use his right UE and sling was not on on arrival.  Once he sat up, noted thatbrace was not snug. Assisted pt with snug application of brace.    Transfers Overall transfer level: Independent Equipment used: None Transfers: Sit to/from Stand Sit to Stand: Min guard         General transfer comment: min guard to rise from bed  Ambulation/Gait Ambulation/Gait assistance: Supervision;Min guard Gait Distance (Feet): 800 Feet Assistive device: None Gait Pattern/deviations: WFL(Within Functional Limits) Gait velocity: decreased   General Gait Details: Supervision for safety with negotiating unlevel surfaces i.e. ramps, thresholds   Stairs   Stairs assistance: Min guard Stair Management: Forwards;One rail Left Number of Stairs: 12 General stair comments: Cues for use of L railing rather than R   Wheelchair Mobility    Modified Rankin (Stroke Patients Only)       Balance Overall balance assessment: Needs assistance Sitting-balance support: Feet supported Sitting balance-Leahy Scale: Normal Sitting balance - Comments: [email or web address] for support Postural control: Left lateral lean Standing balance support: During functional activity;Single extremity supported Standing balance-Leahy Scale: Poor Standing balance comment: multiple LOB in all directions, pt needing assist to correct as the pt had no evidence of reactive steps to correct if moderately challenged.                            Cognition Arousal/Alertness: Awake/alert Behavior During Therapy: WFL for tasks assessed/performed Overall Cognitive Status: Impaired/Different from baseline Area of Impairment: Rancho level  Rancho Levels of Cognitive Functioning Rancho Los Amigos Scales of Cognitive Functioning: Automatic/appropriate Orientation Level: Disoriented to;Place;Time;Situation Current Attention Level: Sustained Memory: Decreased  recall of precautions (in functional use) Following Commands: Follows one step commands with increased time;Follows multi-step commands inconsistently Safety/Judgement: Decreased awareness of deficits Awareness: Emergent Problem Solving: Slow processing;Decreased initiation;Requires verbal cues        Exercises      General Comments General comments (skin integrity, edema, etc.): pt requires max (A) don TLSO      Pertinent Vitals/Pain Pain Assessment: No/denies pain    Home Living                      Prior Function            PT Goals (current goals can now be found in the care plan section) Acute Rehab PT Goals Patient Stated Goal: to shave with razor to shorter beard PT Goal Formulation: With patient Time For Goal Achievement: 07/29/20 Potential to Achieve Goals: Good Progress towards PT goals: Progressing toward goals    Frequency    Min 2X/week      PT Plan Current plan remains appropriate    Co-evaluation              AM-PAC PT "6 Clicks" Mobility   Outcome Measure  Help needed turning from your back to your side while in a flat bed without using bedrails?: None Help needed moving from lying on your back to sitting on the side of a flat bed without using bedrails?: None Help needed moving to and from a bed to a chair (including a wheelchair)?: None Help needed standing up from a chair using your arms (e.g., wheelchair or bedside chair)?: None Help needed to walk in hospital room?: A Little Help needed climbing 3-5 steps with a railing? : A Little 6 Click Score: 22    End of Session Equipment Utilized During Treatment: Back brace;Other (comment) (sling) Activity Tolerance: Patient tolerated treatment well Patient left: with call bell/phone within reach;in bed;with bed alarm set Nurse Communication: Mobility status PT Visit Diagnosis: Other abnormalities of gait and mobility (R26.89);Pain Pain - Right/Left: Right Pain - part of body:  Shoulder;Arm     Time: 9937-1696 PT Time Calculation (min) (ACUTE ONLY): 16 min  Charges:  $Gait Training: 8-22 mins                     Samuel [name] M,PT Acute Rehab Services [phone] (pager)    Samuel [name] 07/20/2020, 4:37 PM

## 2020-07-21 ENCOUNTER — Inpatient Hospital Stay (HOSPITAL_COMMUNITY): Payer: Self-pay

## 2020-07-21 ENCOUNTER — Other Ambulatory Visit (HOSPITAL_COMMUNITY): Payer: Self-pay

## 2020-07-21 IMAGING — DX DG CLAVICLE*R*
2 series · 2 of 2 positions shown · non-contrast
Comparison: [DATE].  CT [DATE].

CLINICAL DATA: Fall.

EXAM:
RIGHT CLAVICLE - 2+ VIEWS

[clavicle ap]
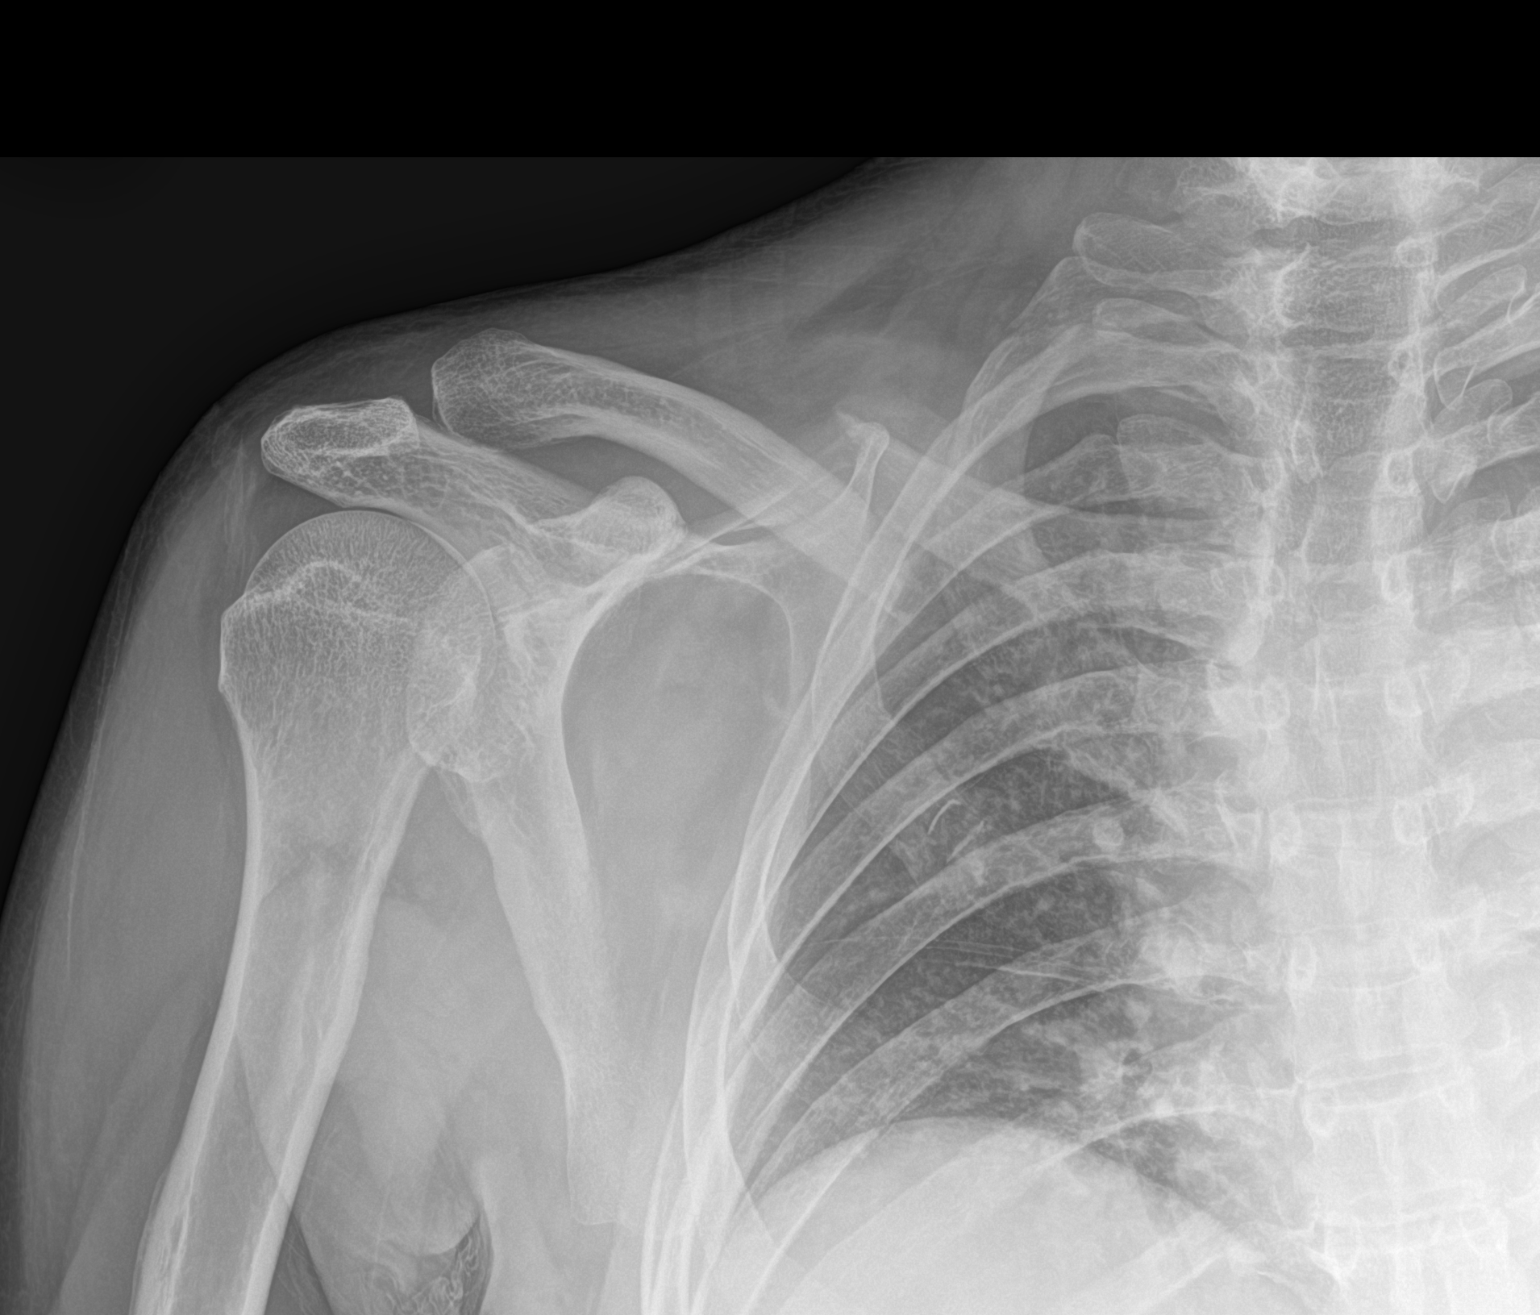

[clavicle axial]
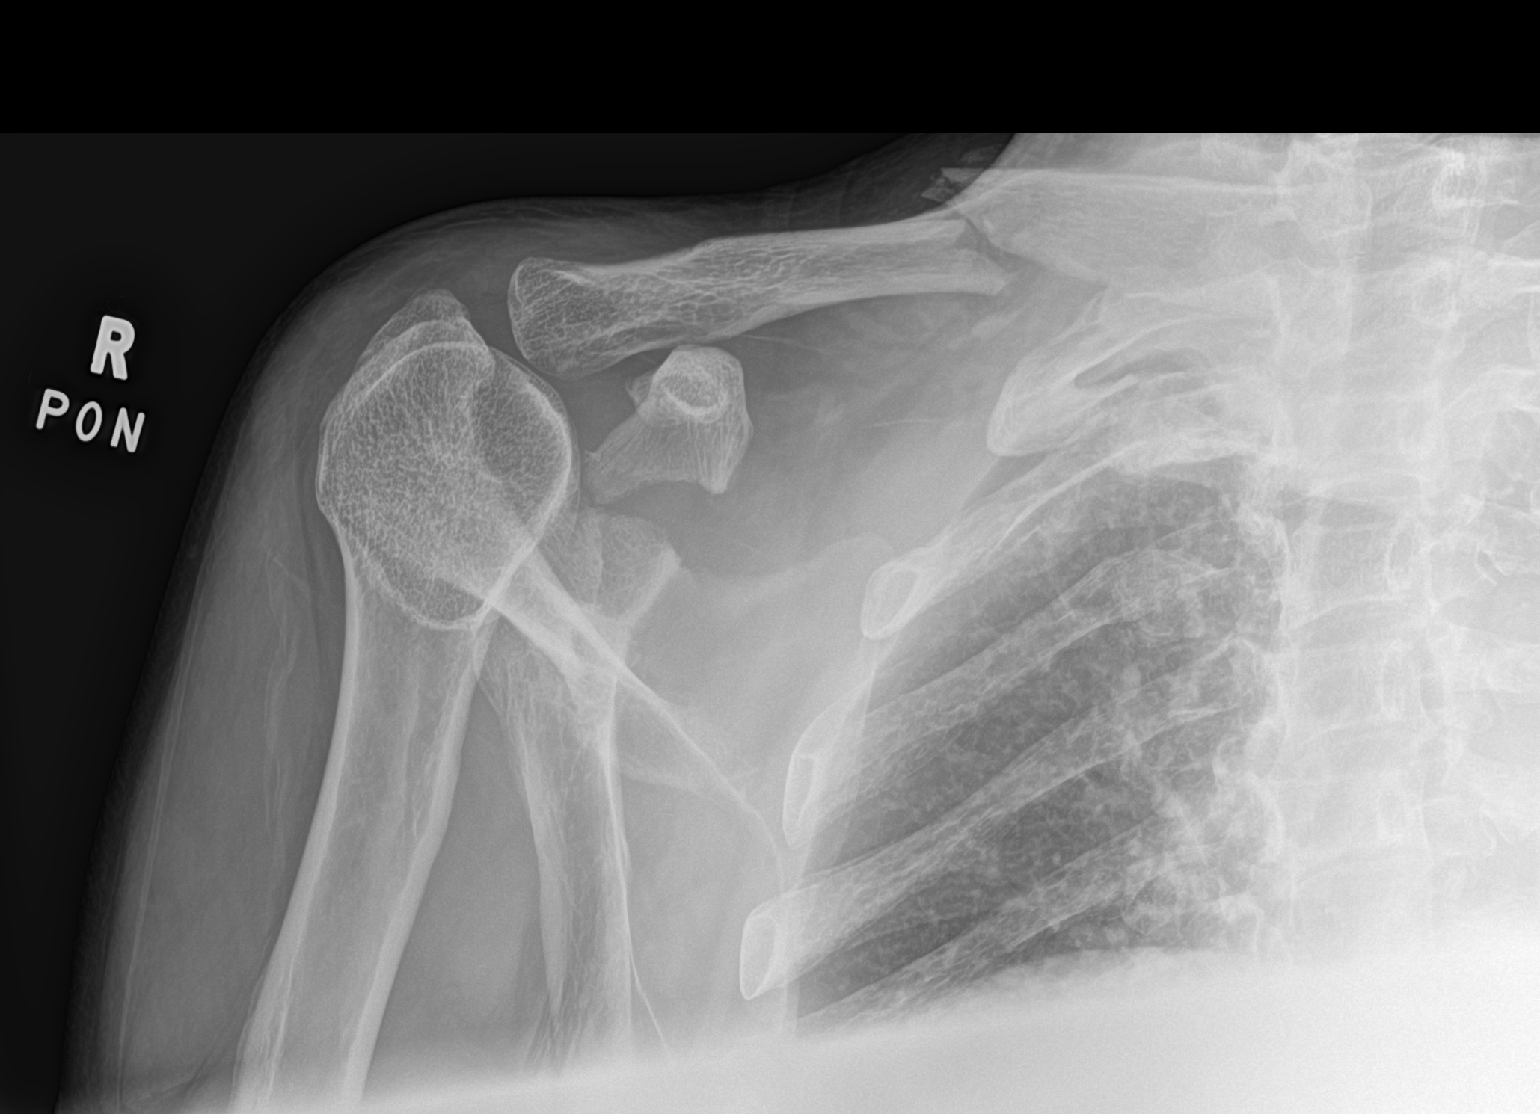

[2 of 2 positions shown; findings below may reference images not displayed]

FINDINGS: Displaced, angulated, overriding fracture of the right mid clavicle
again noted. Displaced coronoid process fracture again noted.
Fracture may again extend into the glenoid visualized. Right rib and
thoracic spine vertebral fractures best identified by prior CT.
Portions of the right lung are unremarkable.
IMPRESSION: 1. Displaced, angulated, overriding fracture of the right mid
clavicle again noted.

2. Displaced coracoid process fracture again noted. Again the
fracture may extend into the glenoid.

3. Right rib and thoracic spine vertebral fractures best identified
by prior CT.

## 2020-07-21 MED ORDER — ACETAMINOPHEN 325 MG PO TABS
650.0000 mg | ORAL_TABLET | Freq: Four times a day (QID) | ORAL | Status: DC | PRN
Start: 2020-07-21 — End: 2021-10-21

## 2020-07-21 MED ORDER — OXYCODONE HCL 10 MG PO TABS
5.0000 mg | ORAL_TABLET | Freq: Four times a day (QID) | ORAL | 0 refills | Status: DC | PRN
  Filled 2020-07-21: qty 25, 7d supply, fill #0

## 2020-07-21 MED ORDER — METHOCARBAMOL 500 MG PO TABS
1000.0000 mg | ORAL_TABLET | Freq: Three times a day (TID) | ORAL | 0 refills | Status: DC | PRN
  Filled 2020-07-21: qty 45, 8d supply, fill #0

## 2020-07-21 NOTE — Progress Notes (Addendum)
Occupational Therapy Treatment Patient Details Name: Samuel [name] MRN: [id number] DOB: 30-Oct-1970 Today's Date: 07/21/2020    History of present illness 50 y.o. male found down outside his house - fall vs syncope.. Pt with T10, T 7 spinal fxs, cerivcalgia, R 1st, 3rd, 5th rib fx, R scapula and clavicle fx with questionable syncope, TBI with SAH; ETOH abuse - on CIWA.   OT comments  Samuel [name] is progressing well with plans to d/c home today. Upon arrival, pt was completely dressed and declined need for ADLs. Reviewed all precautions and restrictions, pt verbalized great understanding however during functional activities pt observed not maintaining back or RUE precautions fully. Pt demonstrated good ability to doff/don brace and sling. Independent tranfers. Pt would benefit from continued OT services should his length of stay continue. Recommend d/c home with supervision 24/7.     Follow Up Recommendations  No OT follow up;Supervision/Assistance - 24 hour    Equipment Recommendations  Tub/shower seat       Precautions / Restrictions Precautions Precautions: Fall;Back;Shoulder Type of Shoulder Precautions: RUE NWB at this time/ sling for comfort per PA 07/17/20 in secure chat Shoulder Interventions: Shoulder sling/immobilizer Precaution Comments: Cervical collar d/c'ed 6/27 per Nundkumar Required Braces or Orthoses: Spinal Brace Spinal Brace: Thoracolumbosacral orthotic;Applied in standing position Restrictions Weight Bearing Restrictions: Yes RUE Weight Bearing: Non weight bearing Other Position/Activity Restrictions: sling for comfort only, sling was ill fitting upon arrival. pt verablized understanding to not weight bear through RUE however during functional activity pt observed not maintaining       Mobility Bed Mobility     General bed mobility comments: pt was sitting in chair upon arrival    Transfers Overall transfer level: Independent   Transfers: Sit  to/from Stand Sit to Stand: Independent   Balance     Sitting balance-Leahy Scale: Normal       Standing balance-Leahy Scale: Fair         ADL either performed or assessed with clinical judgement   ADL Overall ADL's : Needs assistance/impaired Eating/Feeding: Independent               Upper Body Dressing : Modified independent;Cueing for compensatory techniques Upper Body Dressing Details (indicate cue type and reason): including TLSO and sling     Toilet Transfer: Independent           Functional mobility during ADLs: Independent General ADL Comments: pt full dressed unpon arrival wtih no ADL needs; pt demonstrated great abiliy to doff/don TLSO brace and sling               Cognition Arousal/Alertness: Awake/alert Behavior During Therapy: WFL for tasks assessed/performed Overall Cognitive Status: Impaired/Different from baseline Area of Impairment: Memory;Following commands;Safety/judgement;Awareness                   Current Attention Level: Sustained Memory: Decreased recall of precautions Following Commands: Follows one step commands with increased time;Follows multi-step commands inconsistently Safety/Judgement: Decreased awareness of deficits Awareness: Emergent Problem Solving: Slow processing;Decreased initiation;Requires verbal cues                General Comments pt verbalized understanding of all precautions, and wear schedule for braces. however does not fully adhere to them during functional activity    Pertinent Vitals/ Pain       Pain Assessment: No/denies pain   Frequency  Min 2X/week        Progress Toward Goals  OT Goals(current goals can now be  found in the care plan section)  Progress towards OT goals: Progressing toward goals  Acute Rehab OT Goals Patient Stated Goal: to shave with razor to shorter beard OT Goal Formulation: With patient Potential to Achieve Goals: Good ADL Goals Pt Will Perform Grooming:  with supervision;standing Pt Will Perform Upper Body Bathing: with min assist;sitting Pt Will Perform Lower Body Bathing: with supervision;with adaptive equipment;sit to/from stand Pt Will Perform Upper Body Dressing: with modified independence;sitting Pt Will Perform Lower Body Dressing: with modified independence;sit to/from stand;with adaptive equipment Pt Will Transfer to Toilet: with modified independence;ambulating Additional ADL Goal #1: Pt will independnelty verbalize understanding of precautions Additional ADL Goal #2: Pt will manage back brace min A  Plan Discharge plan needs to be updated       AM-PAC OT "6 Clicks" Daily Activity     Outcome Measure   Help from another person eating meals?: None Help from another person taking care of personal grooming?: None Help from another person toileting, which includes using toliet, bedpan, or urinal?: None Help from another person bathing (including washing, rinsing, drying)?: A Little Help from another person to put on and taking off regular upper body clothing?: None Help from another person to put on and taking off regular lower body clothing?: A Little 6 Click Score: 22    End of Session    OT Visit Diagnosis: Unsteadiness on feet (R26.81);Other abnormalities of gait and mobility (R26.89);Muscle weakness (generalized) (M62.81);History of falling (Z91.81);Other symptoms and signs involving cognitive function;Dizziness and giddiness (R42);Pain   Activity Tolerance Patient tolerated treatment well   Patient Left in chair;with call bell/phone within reach   Nurse Communication Mobility status        Time: 2694-8546 OT Time Calculation (min): 13 min  Charges: OT General Charges $OT Visit: 1 Visit OT Treatments $Self Care/Home Management : 8-22 mins    Luv Mish A Thelda Gagan 07/21/2020, 2:59 PM

## 2020-07-21 NOTE — Plan of Care (Signed)
  Problem: Pain Managment: Goal: General experience of comfort will improve Outcome: Progressing   Problem: Safety: Goal: Ability to remain free from injury will improve Outcome: Progressing   

## 2020-07-21 NOTE — Progress Notes (Signed)
Discharge instructions given and reviewed with patient. Intrepreter present and assisted with instructions.

## 2020-07-21 NOTE — TOC Transition Note (Signed)
Transition of Care [facility]) - CM/SW Discharge Note   Patient Details  Name: Samuel [name] MRN: [id number] Date of Birth: 1970-04-28  [facility]) CM/SW Contact:  Ella Bodo, RN Phone Number: 07/21/2020, 2:28 PM   Clinical Narrative:  Pt medically stable for discharge today; he is oriented and walking without any assistance or assistive device.  Met with pt to discuss discharge location; used [employer] [place], [phone].  Interpreter called patient's friend, [name], and was able to confirm discharge address of [address] [place], [place].  Will arrange Safe Transport to this address when pt is ready.  Pt uninsured, but is eligible for medication assistance, using [facility] MATCH program.  DC Rx to be sent to [place] and filled using [facility] letter with copay overrides.       Final next level of care: Home/Self Care Barriers to Discharge: Barriers Resolved   Patient Goals and CMS Choice Patient states their goals for this hospitalization and ongoing recovery are:: to go home                            Discharge Plan and Services   Discharge Planning Services: CM Consult, [facility], [facility], Medication Assistance                                 Social Determinants of Health (SDOH) Interventions     Readmission Risk Interventions Readmission Risk Prevention Plan 07/21/2020  Medication Screening Complete  Transportation Screening Complete   Reinaldo Raddle, RN, BSN  Trauma/Neuro ICU Case Manager [phone]

## 2020-07-21 NOTE — Discharge Summary (Addendum)
Patient ID: Samuel [name] [id number] 1970/04/11 50 y.o.  Admit date: 07/02/2020 Discharge date: 07/21/2020  Admitting Diagnosis: Fall Acute intracranial hemorrhage T10 body fracture and fx of posterior elements T7 endplate fracture Right first rib fx EtOH intoxication Right scapular fracture Right clavicle fracture Elevated LFTs Hypokalemia Hyperglycemia  Discharge Diagnosis Patient Active Problem List   Diagnosis Date Noted   Fall 07/02/2020  Fall SAH  3 column fracture of T10 vertebra  T7 endplate fracture  Cervicalgia  Right first rib fx  EtOH abuse  Right scapular fracture, R clavicle fx  Questionable syncope  Consultants Dr. Conchita Paris - NSGY Dr. Carola Frost - ortho  Reason for Admission: Pt is a 50 yo M who presented to the ED as a fall.  He has given multiple stories to the ED PA.  After several hours of sobering up, he says he was carrying heavy tiles on his right shoulder and back.  He had at least 2 beers today and was working outside.  He doesn't think he fell, he thinks he just passed out.  There was no history of n/v.     He drinks around 12 beers per night "if he has the money."   Procedures none  Hospital Course:  [facility]  NSGY c/s, Dr. Conchita Paris, repeat CT head 6/17 stable, keppra x7d for sz ppx, TBI tx  3 column fracture of T10 vertebra  NSGY c/s, Dr. Conchita Paris, TLSO brace for now, encouraged compliance with brace, MRI T-spine 6/17 to further evaluate.  TLSO was recommended when out of bed.  No intervention warranted currently.  T7 endplate fracture  NSGY c/s, Dr. Conchita Paris, pain control, already in TLSO brace, F/U outpatient in 2 weeks with repeat x-rays.  Cervicalgia  He initially remained in a c-collar due to some neck pain.  This was further evaluated with an MRI on 6/17 and noted to be free of acute injuries.  This was then removed.  Right first rib fx  pain control, pulm toilet  EtOH abuse  TOC c/s, CIWA was in  place.  He was started on beer a couple times of day and this was able to be weaned down to 1 beer daily by discharge.    Right scapular fracture, R clavicle fx  Ortho consulted and recommended a sling for comfort.  He will follow up with Dr. Allie Bossier as an outpatient.  Questionable syncope  Echo 6/17 LVEF 65-70% no abnormal wall motion/normal valves, carotid duplex could not be completed as he would not hold still, EEG 6/17 without abnormality.  No further work up needed at this time.  He worked with therapies and his TBI gradually improved.  He was felt stable for DC home at this time with appropriate follow up made.  He will stay with the folks he had been staying with prior to his hospitalization.  Physical Exam: Gen: NAD Heart: regular Lungs: CTAB Abd: soft, NT Ext: sling in place on RUE Psych: A&Ox3  Allergies as of 07/21/2020   No Known Allergies      Medication List     TAKE these medications    acetaminophen 325 MG tablet Commonly known as: TYLENOL Take 2 tablets (650 mg total) by mouth every 6 (six) hours as needed.   methocarbamol 500 MG tablet Commonly known as: ROBAXIN Take 2 tablets (1,000 mg total) by mouth every 8 (eight) hours as needed for muscle spasms.   Oxycodone HCl 10 MG Tabs Take 0.5-1 tablets (  5-10 mg total) by mouth every 6 (six) hours as needed for moderate pain or severe pain.   thiamine 100 MG tablet Commonly known as: Vitamin B-1 Take 100 mg by mouth daily.          Follow-up Information     Lisbeth Renshaw, MD Follow up in 2 week(s).   Specialty: Neurosurgery Why: Clinical Follow-up Contact information: [address] [name] [address] [phone]         [facility]. Call.   Why: As needed; this clinic takes patients who do not have insurance. Contact information: [address] [place] [address] [phone]                 Signed: Barnetta Chapel, [facility] Surgery 07/21/2020, 2:34 PM Please see Amion for pager number during day hours 7:00am-4:30pm, 7-11:30am on Weekends

## 2020-07-21 NOTE — Plan of Care (Signed)
  Problem: Education: Goal: Knowledge of General Education information will improve Description: Including pain rating scale, medication(s)/side effects and non-pharmacologic comfort measures Outcome: Adequate for Discharge   

## 2020-09-08 ENCOUNTER — Other Ambulatory Visit: Payer: Self-pay

## 2020-09-08 ENCOUNTER — Emergency Department (HOSPITAL_COMMUNITY): Payer: Self-pay

## 2020-09-08 ENCOUNTER — Observation Stay (HOSPITAL_COMMUNITY)
Admission: EM | Admit: 2020-09-08 | Discharge: 2020-09-10 | Disposition: A | Discharge status: Home / Self Care | Payer: Self-pay | Attending: Internal Medicine | Admitting: Internal Medicine

## 2020-09-08 DIAGNOSIS — E878 Other disorders of electrolyte and fluid balance, not elsewhere classified: Secondary | ICD-10-CM | POA: Diagnosis present

## 2020-09-08 DIAGNOSIS — Y900 Blood alcohol level of less than 20 mg/100 ml: Secondary | ICD-10-CM | POA: Insufficient documentation

## 2020-09-08 DIAGNOSIS — G9340 Encephalopathy, unspecified: Secondary | ICD-10-CM | POA: Diagnosis present

## 2020-09-08 DIAGNOSIS — R1011 Right upper quadrant pain: Secondary | ICD-10-CM

## 2020-09-08 DIAGNOSIS — R410 Disorientation, unspecified: Secondary | ICD-10-CM

## 2020-09-08 DIAGNOSIS — Z20822 Contact with and (suspected) exposure to covid-19: Secondary | ICD-10-CM | POA: Insufficient documentation

## 2020-09-08 LAB — COMPREHENSIVE METABOLIC PANEL
ALT: 43 U/L (ref 0–44)
AST: 75 U/L — ABNORMAL HIGH (ref 15–41)
Albumin: 4.1 g/dL (ref 3.5–5.0)
Alkaline Phosphatase: 126 U/L (ref 38–126)
Anion gap: 10 (ref 5–15)
BUN: 5 mg/dL — ABNORMAL LOW (ref 6–20)
CO2: 27 mmol/L (ref 22–32)
Calcium: 8.7 mg/dL — ABNORMAL LOW (ref 8.9–10.3)
Chloride: 99 mmol/L (ref 98–111)
Creatinine, Ser: 0.73 mg/dL (ref 0.61–1.24)
GFR, Estimated: >60 mL/min (ref >60)
Glucose, Bld: 118 mg/dL — ABNORMAL HIGH (ref 70–99)
Potassium: 3.3 mmol/L — ABNORMAL LOW (ref 3.5–5.1)
Sodium: 136 mmol/L (ref 135–145)
Total Bilirubin: 0.7 mg/dL (ref 0.3–1.2)
Total Protein: 7.2 g/dL (ref 6.5–8.1)

## 2020-09-08 LAB — CBC WITH DIFFERENTIAL/PLATELET
Abs Immature Granulocytes: 0.04 10*3/uL (ref 0.00–0.07)
Basophils Absolute: 0.1 10*3/uL (ref 0.0–0.1)
Basophils Relative: 1 %
Eosinophils Absolute: 0 10*3/uL (ref 0.0–0.5)
Eosinophils Relative: 0 %
HCT: 33.4 % — ABNORMAL LOW (ref 39.0–52.0)
Hemoglobin: 11.1 g/dL — ABNORMAL LOW (ref 13.0–17.0)
Immature Granulocytes: 1 %
Lymphocytes Relative: 3 %
Lymphs Abs: 0.3 10*3/uL — ABNORMAL LOW (ref 0.7–4.0)
MCH: 32.6 pg (ref 26.0–34.0)
MCHC: 33.2 g/dL (ref 30.0–36.0)
MCV: 97.9 fL (ref 80.0–100.0)
Monocytes Absolute: 0.6 10*3/uL (ref 0.1–1.0)
Monocytes Relative: 8 %
Neutro Abs: 6.8 10*3/uL (ref 1.7–7.7)
Neutrophils Relative %: 87 %
Platelets: 102 10*3/uL — ABNORMAL LOW (ref 150–400)
RBC: 3.41 MIL/uL — ABNORMAL LOW (ref 4.22–5.81)
RDW: 13.7 % (ref 11.5–15.5)
WBC: 7.8 10*3/uL (ref 4.0–10.5)
nRBC: 0 % (ref 0.0–0.2)

## 2020-09-08 LAB — I-STAT CHEM 8, ED
BUN: 4 mg/dL — ABNORMAL LOW (ref 6–20)
Calcium, Ion: 1.05 mmol/L — ABNORMAL LOW (ref 1.15–1.40)
Chloride: 98 mmol/L (ref 98–111)
Creatinine, Ser: 0.6 mg/dL — ABNORMAL LOW (ref 0.61–1.24)
Glucose, Bld: 119 mg/dL — ABNORMAL HIGH (ref 70–99)
HCT: 37 % — ABNORMAL LOW (ref 39.0–52.0)
Hemoglobin: 12.6 g/dL — ABNORMAL LOW (ref 13.0–17.0)
Potassium: 3.4 mmol/L — ABNORMAL LOW (ref 3.5–5.1)
Sodium: 138 mmol/L (ref 135–145)
TCO2: 27 mmol/L (ref 22–32)

## 2020-09-08 LAB — ETHANOL: Alcohol, Ethyl (B): <10 mg/dL (ref <10)

## 2020-09-08 LAB — URINALYSIS, ROUTINE W REFLEX MICROSCOPIC
Bacteria, UA: NONE SEEN
Bilirubin Urine: NEGATIVE
Glucose, UA: NEGATIVE mg/dL
Ketones, ur: NEGATIVE mg/dL
Leukocytes,Ua: NEGATIVE
Nitrite: NEGATIVE
Protein, ur: NEGATIVE mg/dL
Specific Gravity, Urine: 1.014 (ref 1.005–1.030)
pH: 5 (ref 5.0–8.0)

## 2020-09-08 LAB — CBG MONITORING, ED: Glucose-Capillary: 119 mg/dL — ABNORMAL HIGH (ref 70–99)

## 2020-09-08 LAB — LACTIC ACID, PLASMA: Lactic Acid, Venous: 1.9 mmol/L (ref 0.5–1.9)

## 2020-09-08 LAB — CK: Total CK: 290 U/L (ref 49–397)

## 2020-09-08 LAB — TROPONIN I (HIGH SENSITIVITY)
Troponin I (High Sensitivity): 12 ng/L (ref <18)
Troponin I (High Sensitivity): 16 ng/L (ref <18)

## 2020-09-08 LAB — MAGNESIUM: Magnesium: 1.4 mg/dL — ABNORMAL LOW (ref 1.7–2.4)

## 2020-09-08 IMAGING — CT CT HEAD W/O CM
4 series · 17 of 47 positions shown, 19 images · non-contrast
Comparison: [DATE]

CLINICAL DATA: Mental status changes

EXAM:
CT HEAD WITHOUT CONTRAST
TECHNIQUE: Contiguous axial images were obtained from the base of the skull
through the vertex without intravenous contrast.

[Series 2: head without · axial · non-contrast · 0.44mm/px · z∈[-142,-17]mm · 7 of 35 slices shown, 9 images]
[im 5/35  brain]
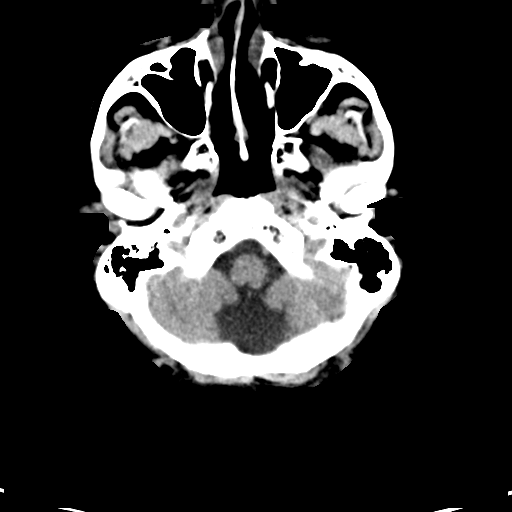
[im 5/35  bone]
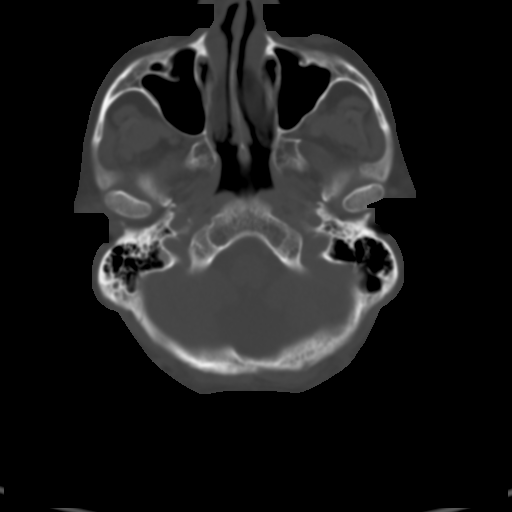
[im 9/35  brain]
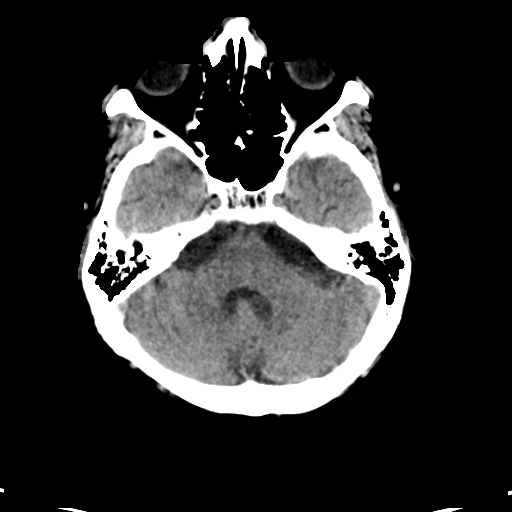
[im 13/35  brain]
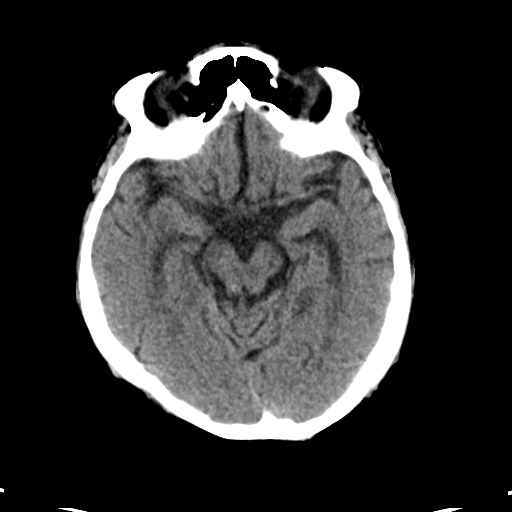
[im 18/35  brain]
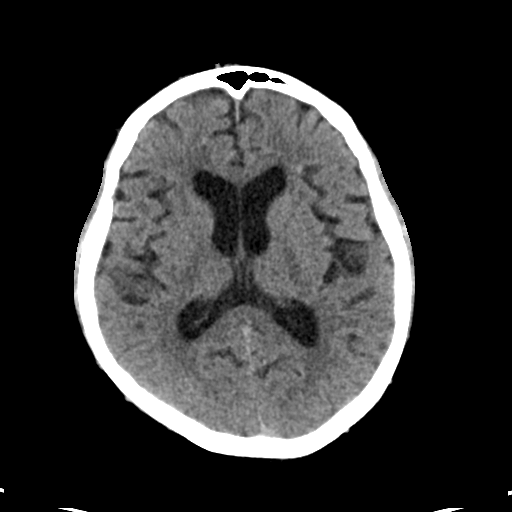
[im 22/35  brain]
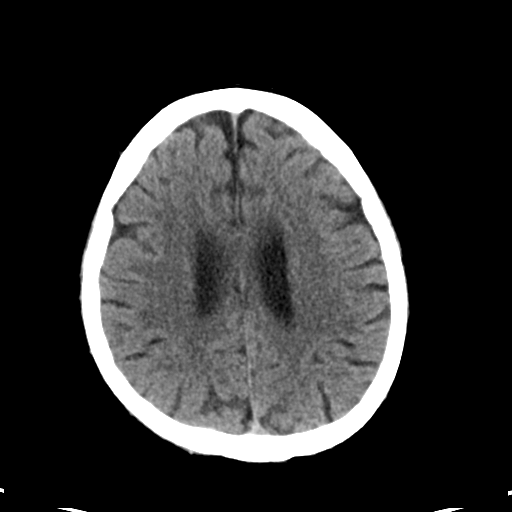
[im 22/35  bone]
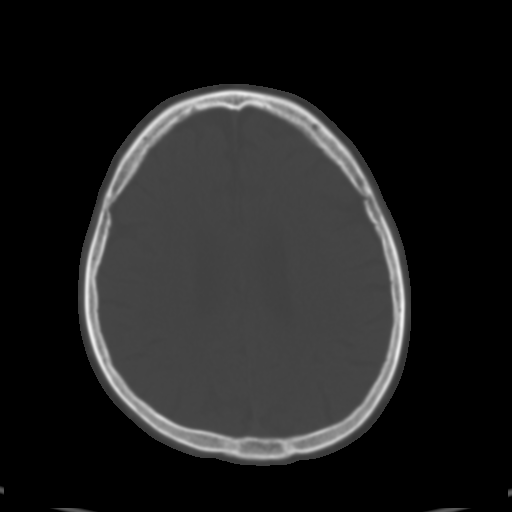
[im 26/35  brain]
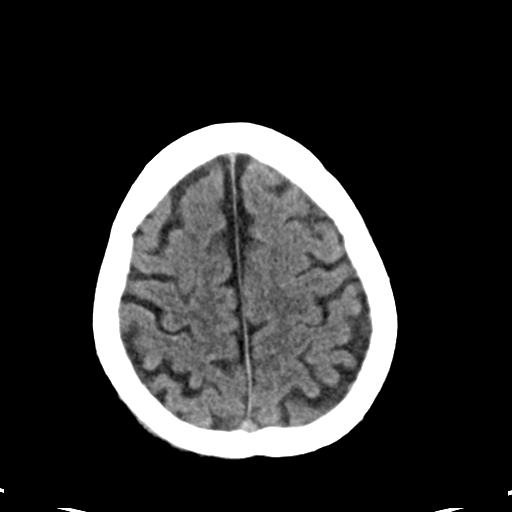
[im 30/35  brain]
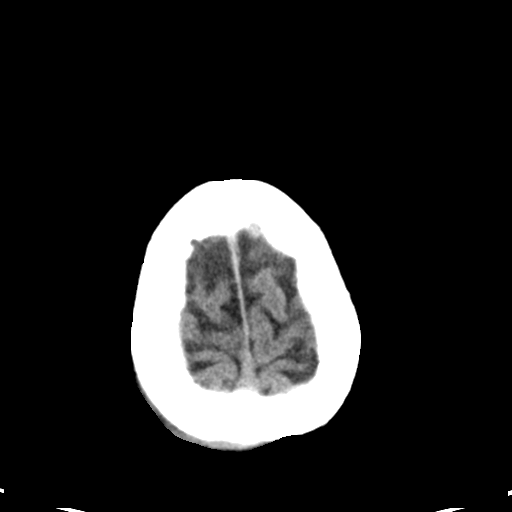

[Series 3: head bone · axial · 0.44mm/px · z∈[-146,-86]mm · 4 of 87 slices shown]
[im 9/87  bone]
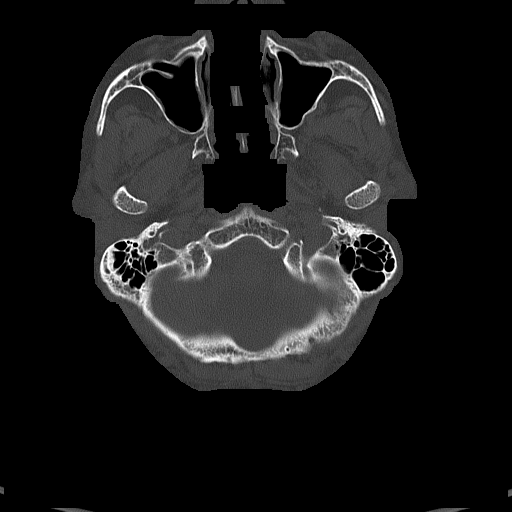
[im 18/87  bone]
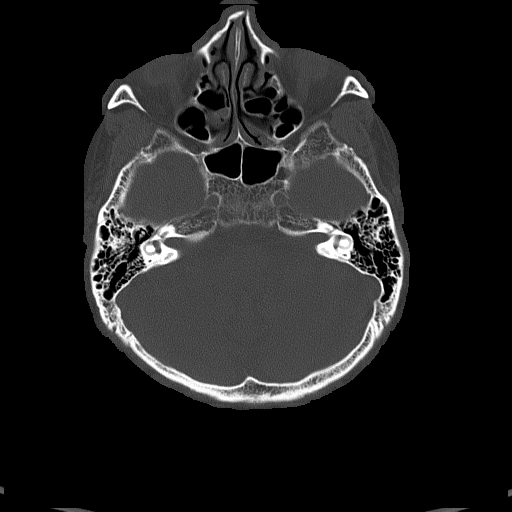
[im 26/87  bone]
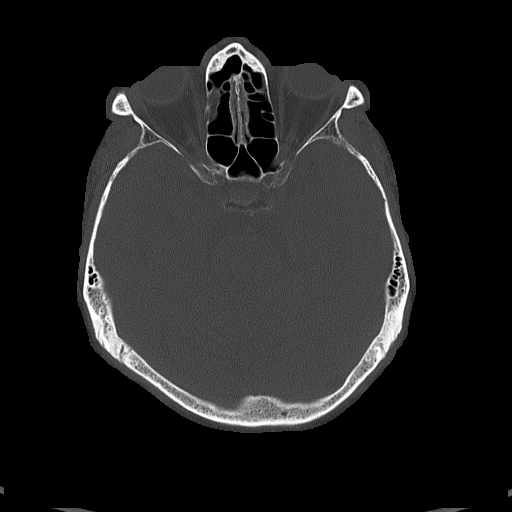
[im 39/87  bone]
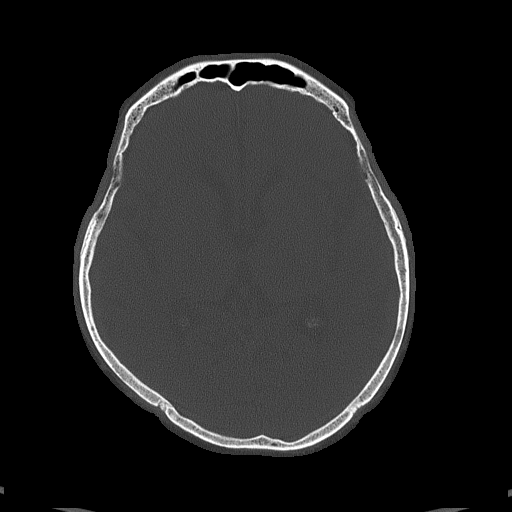

[Series 4: head without cor · coronal · non-contrast · 0.34mm/px · 3 of 67 slices shown]
[im 23/67  brain]
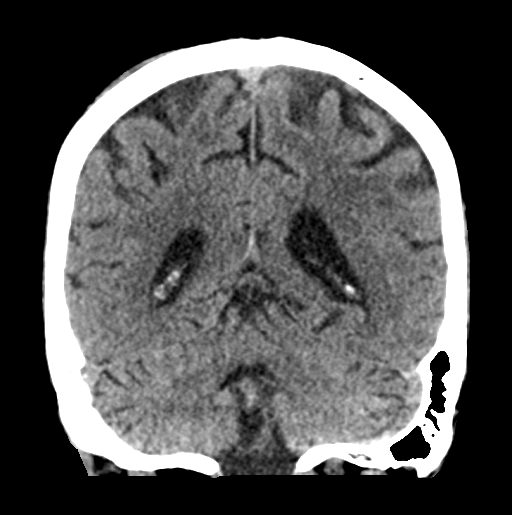
[im 30/67  brain]
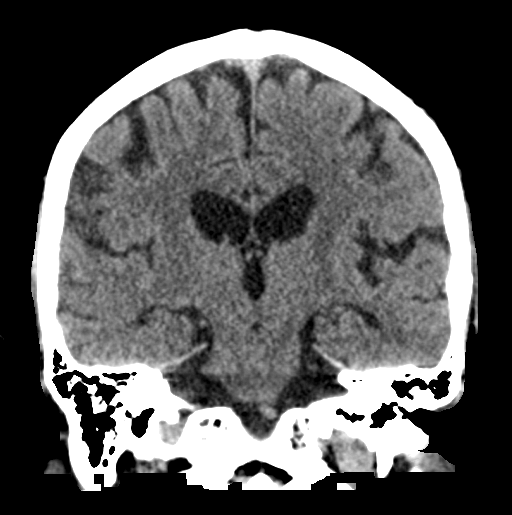
[im 37/67  brain]
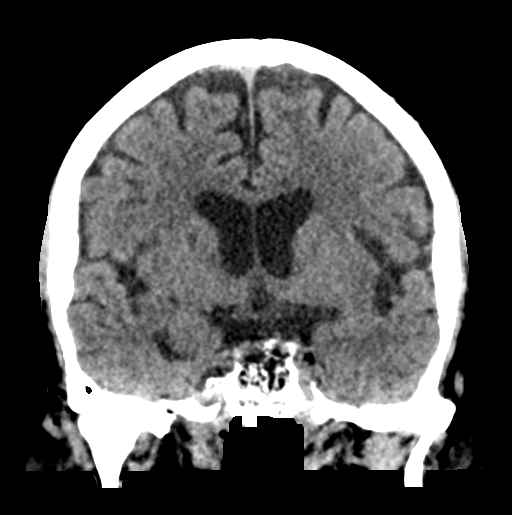

[Series 5: head without sag · sagittal · non-contrast · 0.33mm/px · 3 of 61 slices shown]
[im 21/61  brain]
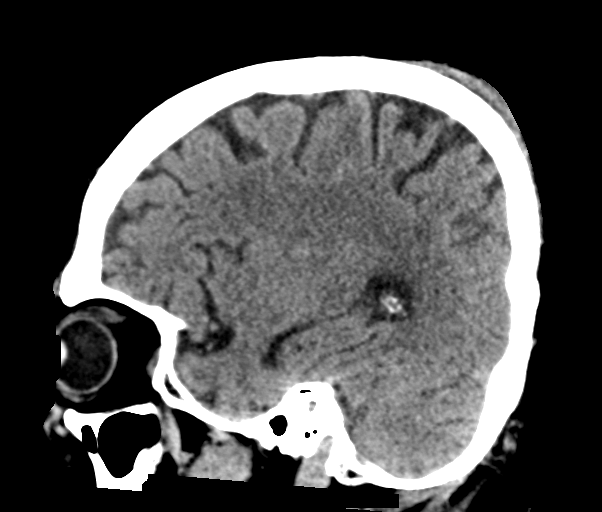
[im 31/61  brain]
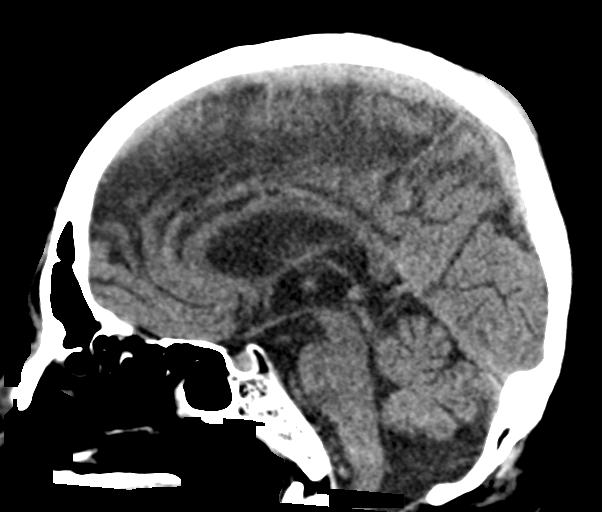
[im 41/61  brain]
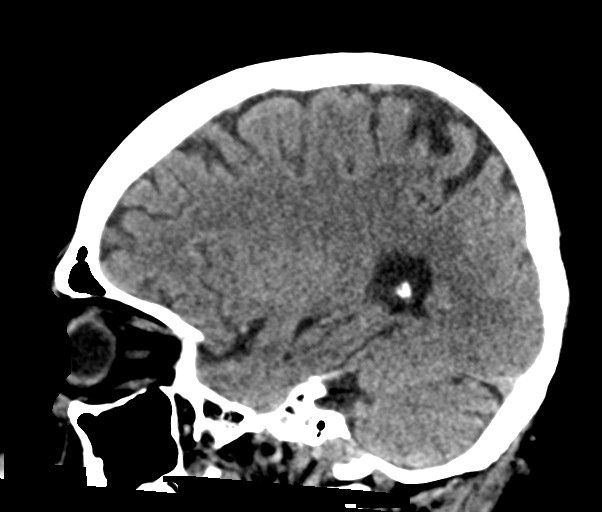

[17 of 47 positions shown; findings below may reference images not displayed]

FINDINGS: Brain: No acute intracranial abnormality. Specifically, no
hemorrhage, hydrocephalus, mass lesion, acute infarction, or
significant intracranial injury.

Vascular: No hyperdense vessel or unexpected calcification.

Skull: No acute calvarial abnormality.

Sinuses/Orbits: No acute findings

Other: None
IMPRESSION: No acute intracranial abnormality.

## 2020-09-08 IMAGING — DX DG CHEST 1V PORT
1 series · 1 of 1 positions shown · non-contrast
Comparison: Comparison is made with CT of the chest, abdomen and
pelvis from [DATE].

CLINICAL DATA: Syncope in a 50-year-old male.

EXAM:
PORTABLE CHEST 1 VIEW

[chest]
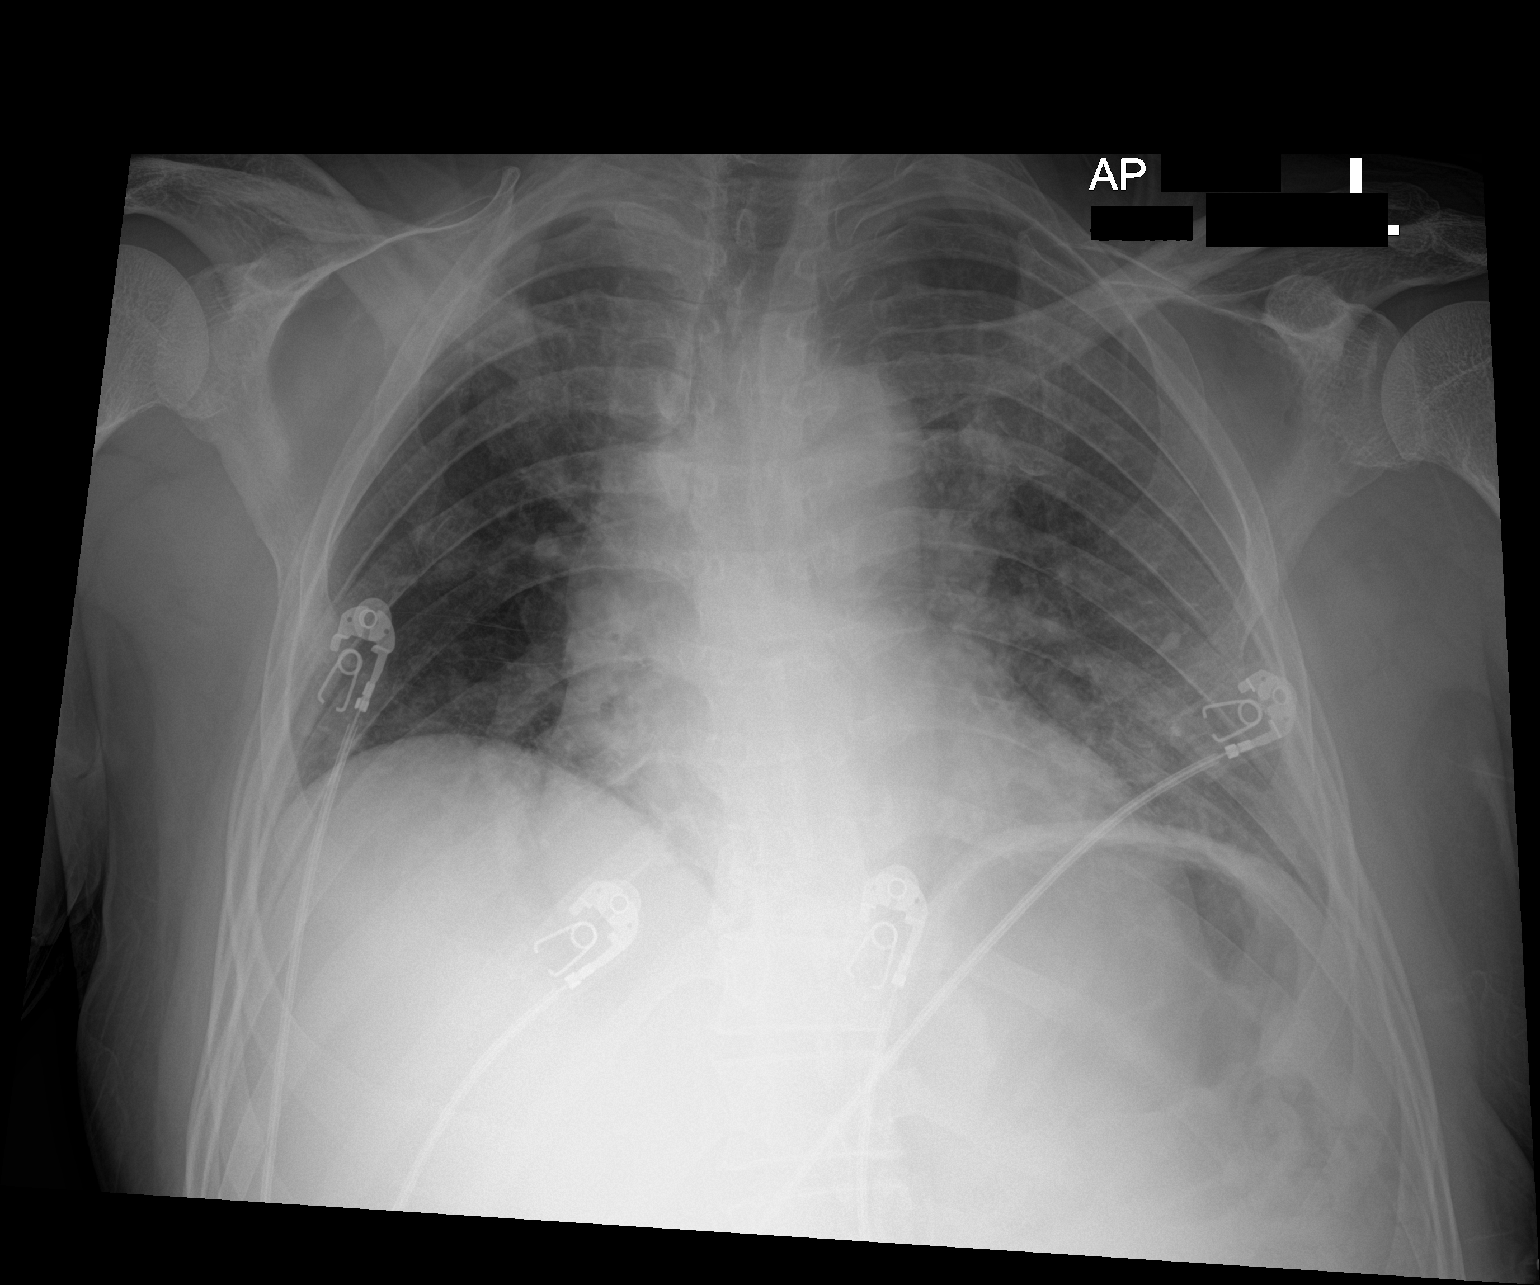

[1 of 1 positions shown; findings below may reference images not displayed]

FINDINGS: Heart size is top-normal. Signs of pulmonary vascular congestion. No
frank edema. No signs of lobar consolidative changes. EKG leads
projecting over the chest.

No visible pneumothorax.

Gastric distension.

On limited assessment no acute skeletal process.
IMPRESSION: 1. Mild cardiomegaly with signs of pulmonary vascular congestion. No
frank edema or consolidation.
2. Gastric distension.

## 2020-09-08 MED ORDER — LORAZEPAM 2 MG/ML IJ SOLN: 0.0000 mg | Freq: Four times a day (QID) | INTRAMUSCULAR | Status: DC

## 2020-09-08 MED ORDER — POTASSIUM CHLORIDE 20 MEQ PO PACK
40.0000 meq | PACK | Freq: Once | ORAL | Status: AC
  Administered 2020-09-09: 40 meq via ORAL
  Filled 2020-09-08: qty 2

## 2020-09-08 MED ORDER — MAGNESIUM SULFATE 2 GM/50ML IV SOLN
2.0000 g | Freq: Once | INTRAVENOUS | Status: AC
  Administered 2020-09-08: 2 g via INTRAVENOUS
  Filled 2020-09-08: qty 50

## 2020-09-08 MED ORDER — LORAZEPAM 2 MG/ML IJ SOLN
1.0000 mg | Freq: Once | INTRAMUSCULAR | Status: AC
  Administered 2020-09-08: 1 mg via INTRAVENOUS
  Filled 2020-09-08: qty 1

## 2020-09-08 MED ORDER — LORAZEPAM 1 MG PO TABS: 0.0000 mg | ORAL_TABLET | Freq: Two times a day (BID) | ORAL | Status: DC

## 2020-09-08 MED ORDER — SODIUM CHLORIDE 0.9 % IV SOLN
INTRAVENOUS | Status: DC | PRN
  Administered 2020-09-08: 250 mL via INTRAVENOUS

## 2020-09-08 MED ORDER — CHLORDIAZEPOXIDE HCL 25 MG PO CAPS
50.0000 mg | ORAL_CAPSULE | Freq: Once | ORAL | Status: AC
  Administered 2020-09-08: 50 mg via ORAL
  Filled 2020-09-08: qty 2

## 2020-09-08 MED ORDER — LACTATED RINGERS IV BOLUS
1000.0000 mL | Freq: Once | INTRAVENOUS | Status: AC
  Administered 2020-09-08: 1000 mL via INTRAVENOUS

## 2020-09-08 MED ORDER — LORAZEPAM 2 MG/ML IJ SOLN: 0.0000 mg | Freq: Two times a day (BID) | INTRAMUSCULAR | Status: DC

## 2020-09-08 MED ORDER — THIAMINE HCL 100 MG/ML IJ SOLN
500.0000 mg | Freq: Three times a day (TID) | INTRAVENOUS | Status: DC
  Administered 2020-09-09 – 2020-09-10 (×5): 500 mg via INTRAVENOUS
  Filled 2020-09-08 (×6): qty 5

## 2020-09-08 MED ORDER — LORAZEPAM 1 MG PO TABS
0.0000 mg | ORAL_TABLET | Freq: Four times a day (QID) | ORAL | Status: DC
  Administered 2020-09-09: 1 mg via ORAL
  Filled 2020-09-08: qty 1

## 2020-09-08 MED ORDER — THIAMINE HCL 100 MG/ML IJ SOLN
100.0000 mg | Freq: Every day | INTRAMUSCULAR | Status: DC
  Administered 2020-09-08: 100 mg via INTRAVENOUS
  Filled 2020-09-08: qty 2

## 2020-09-08 NOTE — ED Provider Notes (Signed)
[facility] Provider Note   CSN: [id number] Arrival date & time: 09/08/20  1839     History No chief complaint on file.   [facility] Standard[facility] [name] is a 50 y.o. male.  The history is provided by the patient. The history is limited by a language barrier. A language interpreter was used.  Patient presents for a generalized weakness and shakiness.  He reports that he was at work earlier today.  He works in [employer] and was working outside throughout the day.  At one point, he felt shaky.  He laid down under a tree and the next thing he knew an ambulance was there.  Patient states that he typically drinks 2 large beers per day.  His last drink was last night.  He has been hospitalized for alcohol withdrawal in the past.  Patient denies any history of seizures.  Per EMS, there was concern of his coworkers that he had syncopized.  During transit, patient got 500 cc of IVF.  Currently, he endorses anxiety and jitteriness.  He denies any areas of pain or discomfort.    Past Medical History:  Diagnosis Date   ETOH abuse     Patient Active Problem List   Diagnosis Date Noted   Acute encephalopathy 09/09/2020   Electrolyte imbalance 09/09/2020   Fall 07/02/2020    History reviewed. No pertinent surgical history.     History reviewed. No pertinent family history.  Social History   Tobacco Use   Smoking status: Never   Smokeless tobacco: Never  Substance Use Topics   Alcohol use: Yes    Alcohol/week: 72.0 standard drinks    Types: 72 Cans of beer per week    Home Medications Prior to Admission medications   Medication Sig Start Date End Date Taking? Authorizing Provider  acetaminophen (TYLENOL) 325 MG tablet Take 2 tablets (650 mg total) by mouth every 6 (six) hours as needed. Patient not taking: Reported on 09/08/2020 07/21/20   Barnetta Chapelsborne, Kelly, PA-C  methocarbamol (ROBAXIN) 500 MG tablet Take 2 tablets (1,000 mg total) by mouth  every 8 (eight) hours as needed for muscle spasms. Patient not taking: Reported on 09/08/2020 07/21/20   Barnetta Chapelsborne, Kelly, PA-C  Oxycodone HCl 10 MG TABS Take 0.5-1 tablets (5-10 mg total) by mouth every 6 (six) hours as needed for moderate pain or severe pain. Patient not taking: Reported on 09/08/2020 07/21/20   Barnetta Chapelsborne, Kelly, PA-C    Allergies    Patient has no known allergies.  Review of Systems   Review of Systems  Constitutional:  Negative for activity change, appetite change, chills, fatigue and fever.  HENT:  Negative for congestion, facial swelling and sore throat.   Eyes:  Negative for pain and visual disturbance.  Respiratory:  Negative for cough, shortness of breath and wheezing.   Cardiovascular:  Negative for chest pain, palpitations and leg swelling.  Gastrointestinal:  Negative for abdominal pain, diarrhea, nausea and vomiting.  Genitourinary:  Negative for flank pain.  Musculoskeletal:  Negative for arthralgias, back pain, gait problem, joint swelling, myalgias and neck pain.  Skin:  Negative for pallor and wound.  Neurological:  Positive for tremors. Negative for dizziness, light-headedness, numbness and headaches.       Concern of syncope but patient denies  Hematological:  Does not bruise/bleed easily.  Psychiatric/Behavioral:  Positive for confusion. The patient is nervous/anxious.    Physical Exam Updated Vital Signs BP (!) 154/96   Pulse 66  Temp 99.9 F (37.7 C) (Oral)   Resp 19   SpO2 97%   Physical Exam Vitals and nursing note reviewed.  Constitutional:      General: He is not in acute distress.    Appearance: He is well-developed and normal weight. He is not toxic-appearing or diaphoretic.  HENT:     Head: Normocephalic and atraumatic.     Right Ear: External ear normal.     Left Ear: External ear normal.     Nose: Nose normal.     Mouth/Throat:     Mouth: Mucous membranes are moist.     Pharynx: Oropharynx is clear.     Comments: No tongue  bite Eyes:     Extraocular Movements: Extraocular movements intact.  Cardiovascular:     Rate and Rhythm: Normal rate and regular rhythm.     Heart sounds: No murmur heard. Pulmonary:     Effort: Pulmonary effort is normal. No respiratory distress.     Breath sounds: Normal breath sounds. No wheezing or rales.  Chest:     Chest wall: No tenderness.  Abdominal:     Palpations: Abdomen is soft.     Tenderness: There is no abdominal tenderness.  Musculoskeletal:        General: No deformity or signs of injury.     Cervical back: Neck supple. No tenderness.     Right lower leg: No edema.     Left lower leg: No edema.  Skin:    General: Skin is warm and dry.     Coloration: Skin is not jaundiced or pale.  Neurological:     General: No focal deficit present.     Mental Status: He is alert. He is disoriented.     Cranial Nerves: No cranial nerve deficit, dysarthria or facial asymmetry.     Sensory: Sensation is intact. No sensory deficit.     Motor: Tremor present. No weakness or abnormal muscle tone.     Coordination: Coordination is intact.     Comments: Oriented to self and place only (states that he is in [place])  Psychiatric:        Mood and Affect: Mood is anxious.        Behavior: Behavior normal.    ED Results / Procedures / Treatments   Labs (all labs ordered are listed, but only abnormal results are displayed) Labs Reviewed  CBC WITH DIFFERENTIAL/PLATELET - Abnormal; Notable for the following components:      Result Value   RBC 3.41 (*)    Hemoglobin 11.1 (*)    HCT 33.4 (*)    Platelets 102 (*)    Lymphs Abs 0.3 (*)    All other components within normal limits  COMPREHENSIVE METABOLIC PANEL - Abnormal; Notable for the following components:   Potassium 3.3 (*)    Glucose, Bld 118 (*)    BUN 5 (*)    Calcium 8.7 (*)    AST 75 (*)    All other components within normal limits  URINALYSIS, ROUTINE W REFLEX MICROSCOPIC - Abnormal; Notable for the following  components:   Hgb urine dipstick SMALL (*)    All other components within normal limits  MAGNESIUM - Abnormal; Notable for the following components:   Magnesium 1.4 (*)    All other components within normal limits  CBG MONITORING, ED - Abnormal; Notable for the following components:   Glucose-Capillary 119 (*)    All other components within normal limits  I-STAT CHEM 8, ED -  Abnormal; Notable for the following components:   Potassium 3.4 (*)    BUN 4 (*)    Creatinine, Ser 0.60 (*)    Glucose, Bld 119 (*)    Calcium, Ion 1.05 (*)    Hemoglobin 12.6 (*)    HCT 37.0 (*)    All other components within normal limits  RESP PANEL BY RT-PCR (FLU A&B, COVID) ARPGX2  ETHANOL  CK  LACTIC ACID, PLASMA  VITAMIN B1  COMPREHENSIVE METABOLIC PANEL  MAGNESIUM  CBC  LIPASE, BLOOD  RPR  TSH  TROPONIN I (HIGH SENSITIVITY)  TROPONIN I (HIGH SENSITIVITY)    EKG None  Radiology CT HEAD WO CONTRAST ( )  Result Date: 09/08/2020 CLINICAL DATA:  Mental status changes EXAM: CT HEAD WITHOUT CONTRAST TECHNIQUE: Contiguous axial images were obtained from the base of the skull through the vertex without intravenous contrast. COMPARISON:  07/03/2020 FINDINGS: Brain: No acute intracranial abnormality. Specifically, no hemorrhage, hydrocephalus, mass lesion, acute infarction, or significant intracranial injury. Vascular: No hyperdense vessel or unexpected calcification. Skull: No acute calvarial abnormality. Sinuses/Orbits: No acute findings Other: None IMPRESSION: No acute intracranial abnormality. Electronically Signed   By: Charlett Nose M.D.   On: 09/08/2020 23:03   DG Chest Port 1 View  Result Date: 09/08/2020 CLINICAL DATA:  Syncope in a 50 year old male. EXAM: PORTABLE CHEST 1 VIEW COMPARISON:  Comparison is made with CT of the chest, abdomen and pelvis from July 02, 2020. FINDINGS: Heart size is top-normal. Signs of pulmonary vascular congestion. No frank edema. No signs of lobar consolidative  changes. EKG leads projecting over the chest. No visible pneumothorax. Gastric distension. On limited assessment no acute skeletal process. IMPRESSION: 1. Mild cardiomegaly with signs of pulmonary vascular congestion. No frank edema or consolidation. 2. Gastric distension. Electronically Signed   By: Donzetta Kohut M.D.   On: 09/08/2020 19:47    Procedures Procedures   Medications Ordered in ED Medications  thiamine 500mg  in normal saline (40ml) IVPB (500 mg Intravenous New Bag/Given 09/09/20 0107)  LORazepam (ATIVAN) tablet 1-4 mg (has no administration in time range)    Or  LORazepam (ATIVAN) injection 1-4 mg (has no administration in time range)  folic acid (FOLVITE) tablet 1 mg (has no administration in time range)  multivitamin with minerals tablet 1 tablet (has no administration in time range)  enoxaparin (LOVENOX) injection 40 mg (has no administration in time range)  sodium chloride 0.9 % 1,000 mL with thiamine 100 mg, folic acid 1 mg, M.V.I. Adult 10 mL infusion (has no administration in time range)  lactated ringers bolus 1,000 mL (0 mLs Intravenous Stopped 09/08/20 2218)  chlordiazePOXIDE (LIBRIUM) capsule 50 mg (50 mg Oral Given 09/08/20 2004)  LORazepam (ATIVAN) injection 1 mg (1 mg Intravenous Given 09/08/20 2216)  magnesium sulfate IVPB 2 g 50 mL (0 g Intravenous Stopped 09/09/20 0059)  potassium chloride (KLOR-CON) packet 40 mEq (40 mEq Oral Given 09/09/20 0003)    ED Course  I have reviewed the triage vital signs and the nursing notes.  Pertinent labs & imaging results that were available during my care of the patient were reviewed by me and considered in my medical decision making (see chart for details).    MDM Rules/Calculators/A&P                           Patient presents for episode that occurred at work.  He describes as feeling shaky and laying under a tree to rest.  EMS stated that coworkers had concern of a syncopal episode.  Patient's vital signs upon arrival  notable for hypertension.  On exam, he is disoriented.  He is unable to state the year or the president.  His history is limited by his confusion.  He is found to be tremulous and does endorse feelings of anxiousness.  Per chart review, patient does have an alcohol abuse history.  A year ago, he would frequently have blood alcohol levels in the 300-400s.  Currently, he states that his daily consumption is 2 large beers per day.  His last beer was yesterday evening.  He has undergone alcohol withdrawals in the past and has been hospitalized for alcohol withdrawals.  Presentation is concerning for acute alcohol withdrawal.  We will give IV fluids, and Librium.  Laboratory work-up, including cardiac enzymes was ordered.  Labs were notable for a negative ethanol level, raising further concern for acute alcohol withdrawal.  Lactic acid and CK was normal, suggesting against seizure episode.  Platelets were found to be low at 102, which is a new finding compared to prior labs.  Patient's glucose was normal.  Potassium was slightly low.  Will check magnesium and replace electrolytes in the ED.  On reassessment, patient appeared slightly less confused than normal.  He did think it was the year 2020.  Tremulousness has improved.  We will continue to treat with Ativan.  Given his continued confusion and thrombocytopenia, CT scan of head was ordered.  CT scan was negative for any acute abnormalities.  On further reassessment, patient continued to be confused.  I did stand the patient up and he was very unsteady on his feet.  He had a shuffling gait and endorsed dizziness with standing and ambulation.  Patient to be treated empirically for Wernicke's encephalopathy.  Thiamine level was added onto prior labs.  Wernicke dosing of thiamine was ordered.  CIWA orders were placed.  Patient was admitted for further management.  Final Clinical Impression(s) / ED Diagnoses Final diagnoses:  Confusion    Rx / DC Orders ED  Discharge Orders     None        Gloris Manchester, MD 09/09/20 0140

## 2020-09-08 NOTE — ED Triage Notes (Signed)
Pt bib [employer] from work. Per ems , pt has been working all day long outside.  Later on the day, his coworkers noticed that pt started having tremors followed with a syncope episode. Did not loose consciousness. A&O x 2.  Not on any meds.   -fluids given by ems- NS   Ems vitals:  -166/110  -HR 120

## 2020-09-09 ENCOUNTER — Observation Stay (HOSPITAL_COMMUNITY): Payer: Self-pay

## 2020-09-09 ENCOUNTER — Encounter (HOSPITAL_COMMUNITY): Payer: Self-pay | Admitting: Internal Medicine

## 2020-09-09 DIAGNOSIS — G9340 Encephalopathy, unspecified: Secondary | ICD-10-CM

## 2020-09-09 DIAGNOSIS — E878 Other disorders of electrolyte and fluid balance, not elsewhere classified: Secondary | ICD-10-CM | POA: Diagnosis present

## 2020-09-09 LAB — COMPREHENSIVE METABOLIC PANEL
ALT: 37 U/L (ref 0–44)
AST: 67 U/L — ABNORMAL HIGH (ref 15–41)
Albumin: 3.9 g/dL (ref 3.5–5.0)
Alkaline Phosphatase: 115 U/L (ref 38–126)
Anion gap: 9 (ref 5–15)
BUN: <5 mg/dL — ABNORMAL LOW (ref 6–20)
CO2: 28 mmol/L (ref 22–32)
Calcium: 9 mg/dL (ref 8.9–10.3)
Chloride: 100 mmol/L (ref 98–111)
Creatinine, Ser: 0.57 mg/dL — ABNORMAL LOW (ref 0.61–1.24)
GFR, Estimated: >60 mL/min (ref >60)
Glucose, Bld: 96 mg/dL (ref 70–99)
Potassium: 3.9 mmol/L (ref 3.5–5.1)
Sodium: 137 mmol/L (ref 135–145)
Total Bilirubin: 0.7 mg/dL (ref 0.3–1.2)
Total Protein: 7.1 g/dL (ref 6.5–8.1)

## 2020-09-09 LAB — RESP PANEL BY RT-PCR (FLU A&B, COVID) ARPGX2
Influenza A by PCR: NEGATIVE
Influenza B by PCR: NEGATIVE
SARS Coronavirus 2 by RT PCR: NEGATIVE

## 2020-09-09 LAB — CBC
HCT: 35.4 % — ABNORMAL LOW (ref 39.0–52.0)
Hemoglobin: 12 g/dL — ABNORMAL LOW (ref 13.0–17.0)
MCH: 33 pg (ref 26.0–34.0)
MCHC: 33.9 g/dL (ref 30.0–36.0)
MCV: 97.3 fL (ref 80.0–100.0)
Platelets: 100 10*3/uL — ABNORMAL LOW (ref 150–400)
RBC: 3.64 MIL/uL — ABNORMAL LOW (ref 4.22–5.81)
RDW: 13.7 % (ref 11.5–15.5)
WBC: 5 10*3/uL (ref 4.0–10.5)
nRBC: 0 % (ref 0.0–0.2)

## 2020-09-09 LAB — RAPID URINE DRUG SCREEN, HOSP PERFORMED
Amphetamines: NOT DETECTED
Barbiturates: NOT DETECTED
Benzodiazepines: POSITIVE — AB
Cocaine: NOT DETECTED
Opiates: NOT DETECTED
Tetrahydrocannabinol: NOT DETECTED

## 2020-09-09 LAB — MAGNESIUM: Magnesium: 2.3 mg/dL (ref 1.7–2.4)

## 2020-09-09 LAB — RPR: RPR Ser Ql: NONREACTIVE

## 2020-09-09 LAB — TSH: TSH: 4.974 u[IU]/mL — ABNORMAL HIGH (ref 0.350–4.500)

## 2020-09-09 LAB — LIPASE, BLOOD: Lipase: 51 U/L (ref 11–51)

## 2020-09-09 IMAGING — MR MR HEAD W/O CM
11 of 12 series · 44 of 48 positions shown · non-contrast
Comparison: CT from [DATE] as well as multiple recent exams.

CLINICAL DATA: Initial evaluation for acute mental status change,
unknown cause.

EXAM:
MRI HEAD WITHOUT CONTRAST
TECHNIQUE: Multiplanar, multiecho pulse sequences of the brain and surrounding
structures were obtained without intravenous contrast.

[Series 5: DWI · axial · 3.0mm · 0.88mm/px · z∈[-111,+40]mm · 7 of 104 slices shown (1 of 4)]
[im 1/104]
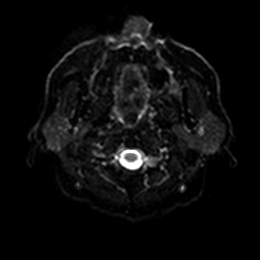
[im 18/104]
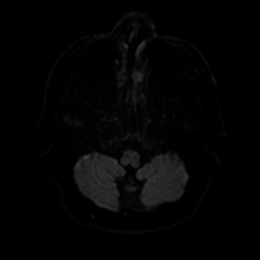
[im 35/104]
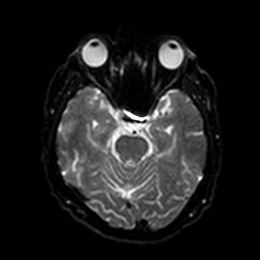
[im 52/104]
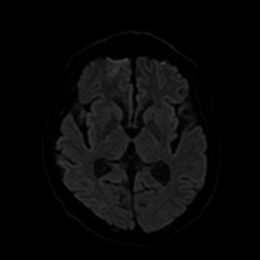
[im 69/104]
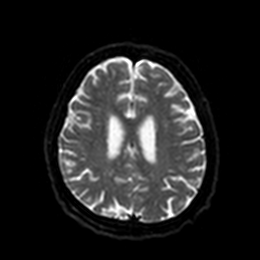
[im 86/104]
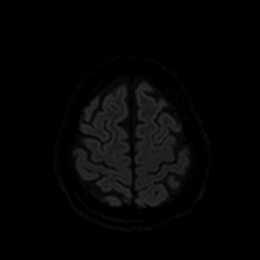
[im 104/104]
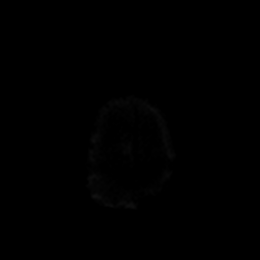

[Series 6: DWI · axial · 3.0mm · 0.88mm/px · z∈[-111,+40]mm · 4 of 51 slices shown (2 of 4)]
[im 1/51]
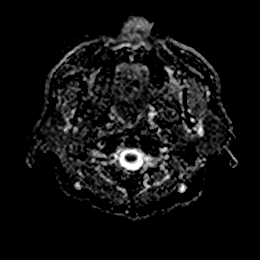
[im 17/51]
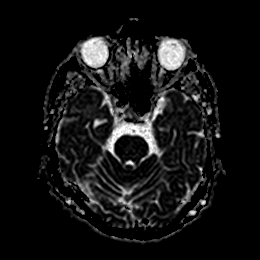
[im 34/51]
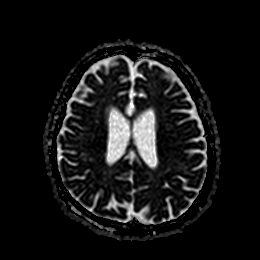
[im 51/51]
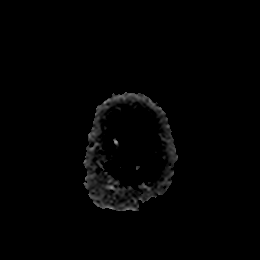

[Series 7: DWI · coronal · 4.0mm · 0.88mm/px · 5 of 70 slices shown (3 of 4)]
[im 1/70]
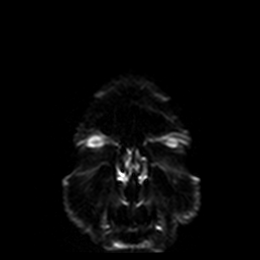
[im 18/70]
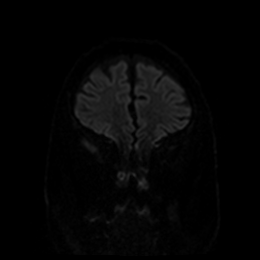
[im 35/70]
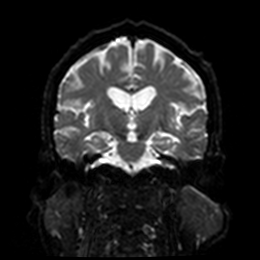
[im 52/70]
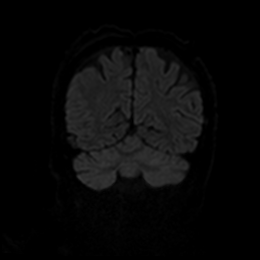
[im 70/70]
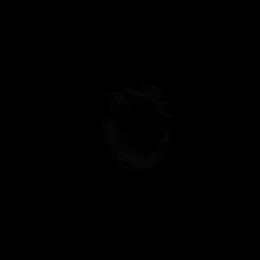

[Series 8: DWI · coronal · 4.0mm · 0.88mm/px · 3 of 35 slices shown (4 of 4)]
[im 1/35]
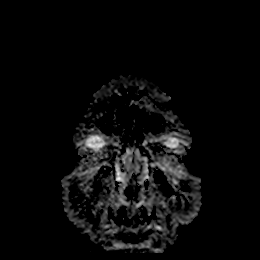
[im 18/35]
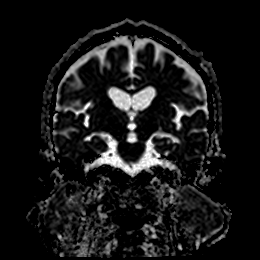
[im 35/35]
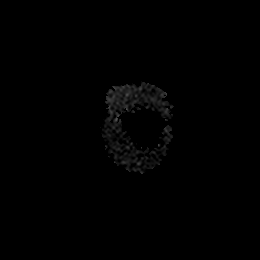

[Series 9: T1 · sagittal · 5.0mm · 0.75mm/px · 2 of 25 slices shown]
[im 1/25]
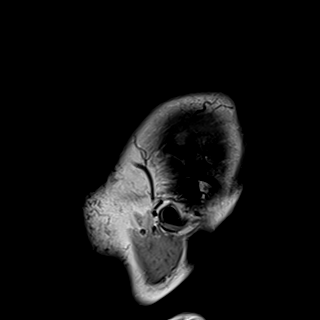
[im 25/25]
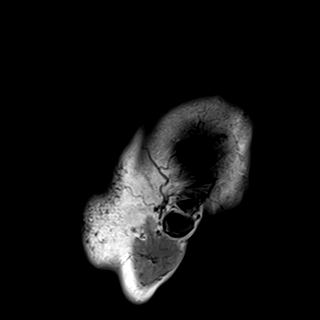

[Series 10: T2 · axial · 5.0mm · 0.72mm/px · z∈[-113,+41]mm · 2 of 27 slices shown]
[im 1/27]
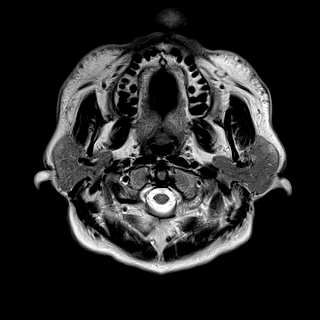
[im 27/27]
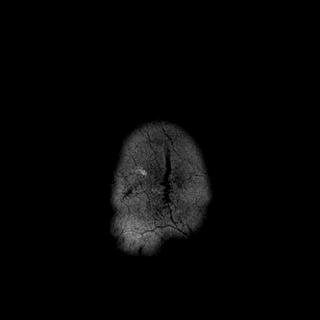

[Series 11: FLAIR · axial · 5.0mm · 0.45mm/px · z∈[-110,+44]mm · 2 of 27 slices shown]
[im 1/27]
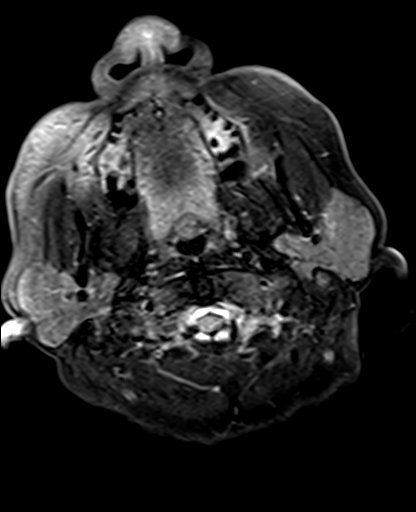
[im 27/27]
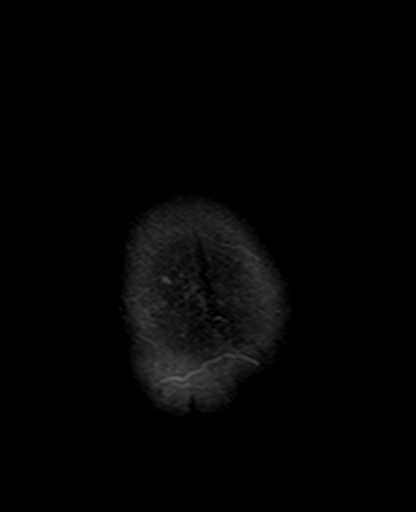

[Series 12: mag_images · axial · 3.0mm · 0.90mm/px · z∈[-120,+54]mm · 5 of 60 slices shown]
[im 1/60]
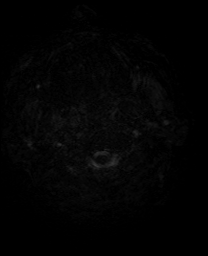
[im 15/60]
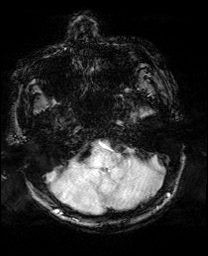
[im 30/60]
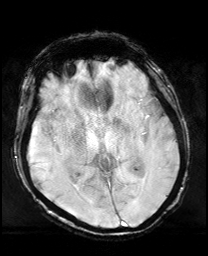
[im 45/60]
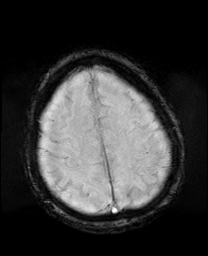
[im 60/60]
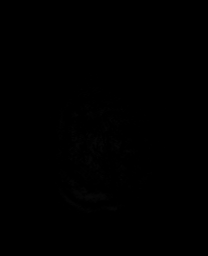

[Series 13: pha_images · axial · 3.0mm · 0.90mm/px · z∈[-117,+54]mm · 5 of 59 slices shown]
[im 1/59]
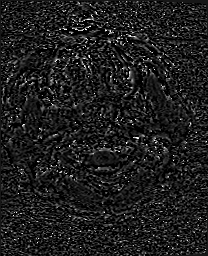
[im 15/59]
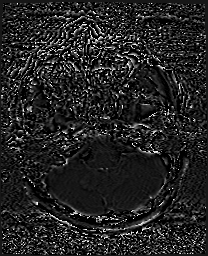
[im 30/59]
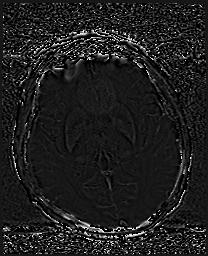
[im 44/59]
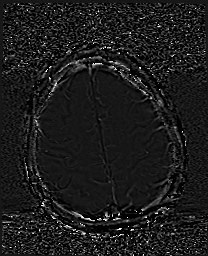
[im 59/59]
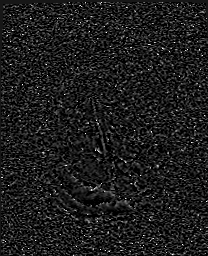

[Series 14: swi_images · axial · 3.0mm · 0.90mm/px · z∈[-120,+54]mm · 5 of 60 slices shown]
[im 1/60]
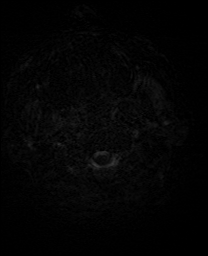
[im 15/60]
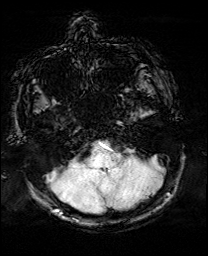
[im 30/60]
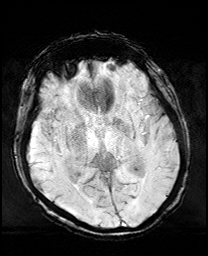
[im 45/60]
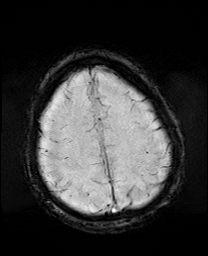
[im 60/60]
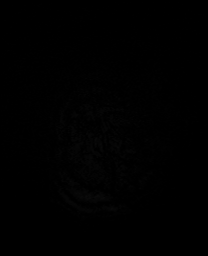

[Series 15: mip_images(sw) · axial · 24.0mm · 0.90mm/px · z∈[-110,+44]mm · 4 of 53 slices shown]
[im 1/53]
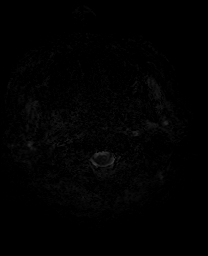
[im 18/53]
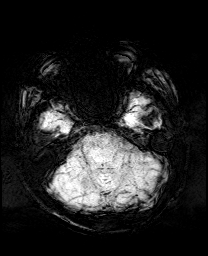
[im 35/53]
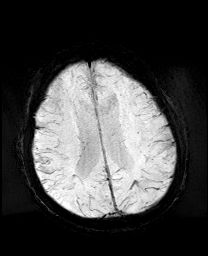
[im 53/53]
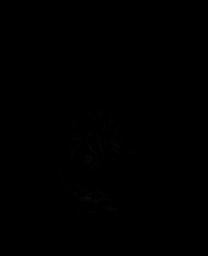

[44 of 48 positions shown; findings below may reference images not displayed]

FINDINGS: Brain: Examination somewhat limited by motion artifact.
Additionally, a coronal T2 weighted sequence is not provided.

Generalized cerebral atrophy, advanced for age. Cerebellar atrophy
noted as well, which may be in part related to history of alcohol
abuse. Mild chronic microvascular ischemic disease noted involving
the periventricular and deep white matter both cerebral hemispheres.

No abnormal foci of restricted diffusion to suggest acute or
subacute ischemia. Gray-white matter differentiation maintained. No
encephalomalacia to suggest chronic cortical infarction. No acute
intracranial hemorrhage. Mild scattered superficial siderosis seen
overlying the cerebellar vermis (series 14, image 21). No acute
hemorrhage seen within this region on most recent CT. Finding
presumably related to history of recent subarachnoid hemorrhage seen
on prior CT from [DATE]. No other evidence for acute or chronic
intracranial hemorrhage.

No mass lesion, midline shift or mass effect. No hydrocephalus or
extra-axial fluid collection. Pituitary gland is mildly irregular
without definite discrete lesion. Midline structures intact.

Vascular: Major intracranial vascular flow voids are maintained.

Skull and upper cervical spine: Craniocervical junction within
normal limits. Bone marrow signal intensity within normal limits.
Probable small scalp contusion at the right parietal vertex.

Sinuses/Orbits: Globes and orbital soft tissues demonstrate no acute
finding. Scattered mucosal thickening noted within the ethmoidal air
cells and maxillary sinuses. Mastoid air cells are largely clear.
Inner ear structures grossly normal.

Other: None.
IMPRESSION: 1. No acute intracranial abnormality.
2. Mild superficial siderosis overlying the cerebellar vermis,
likely related to history of recent subarachnoid hemorrhage as seen
on prior CT from [DATE]. No definite acute intracranial
hemorrhage by MRI.
3. Right parietal scalp contusion.
4. Age advanced cerebral and cerebellar atrophy with mild chronic
microvascular ischemic disease.

## 2020-09-09 IMAGING — US US ABDOMEN LIMITED
1 series · 14 of 25 positions shown · non-contrast
Comparison: None.

CLINICAL DATA: Right upper quadrant abdominal pain.

EXAM:
ULTRASOUND ABDOMEN LIMITED RIGHT UPPER QUADRANT

[Series 1: us abdomen limited ruq (liver/gb) · 14 of 48 slices shown]
[im 1/48]
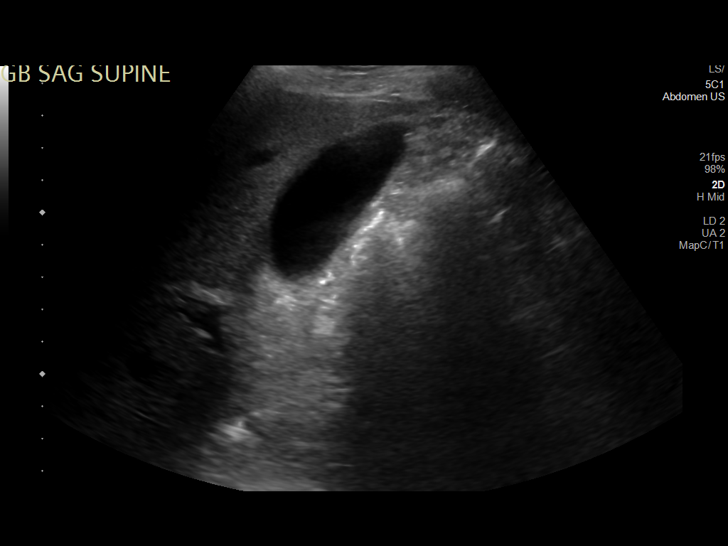
[im 4/48]
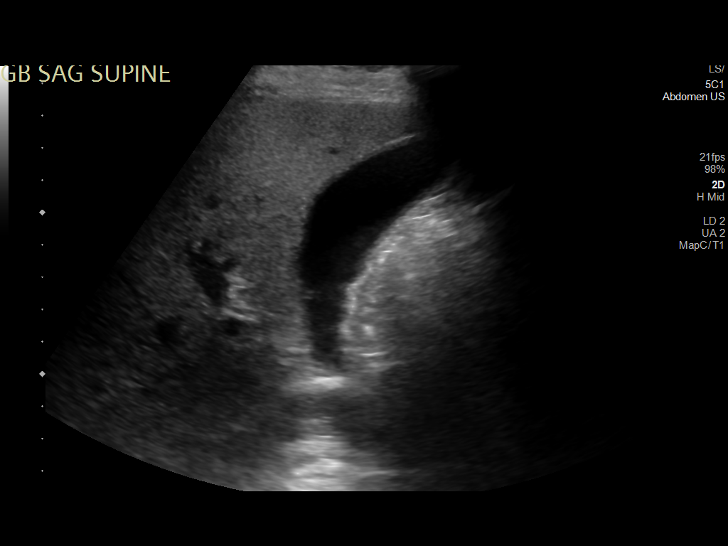
[im 8/48]
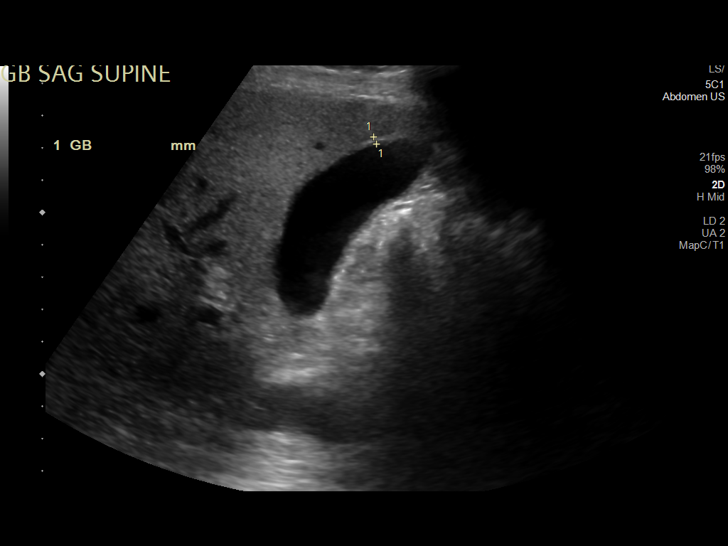
[im 12/48]
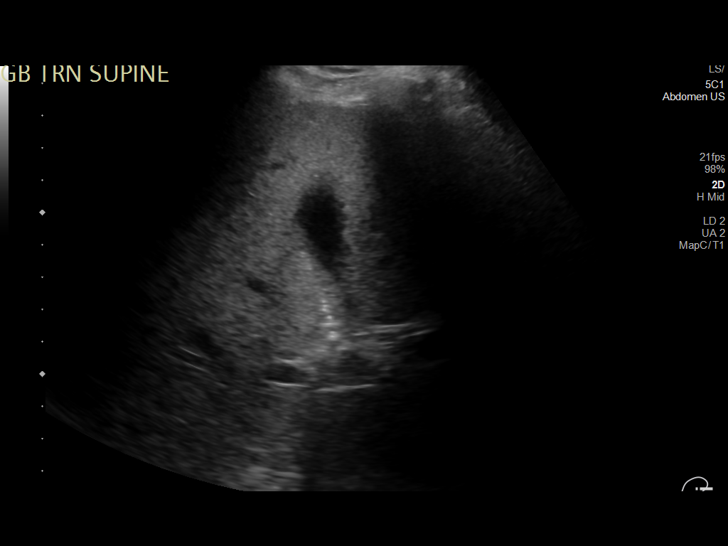
[im 16/48]
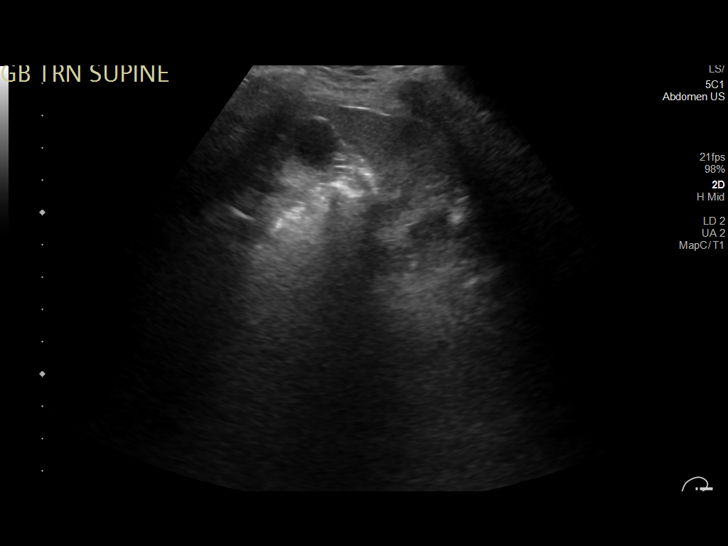
[im 18/48]
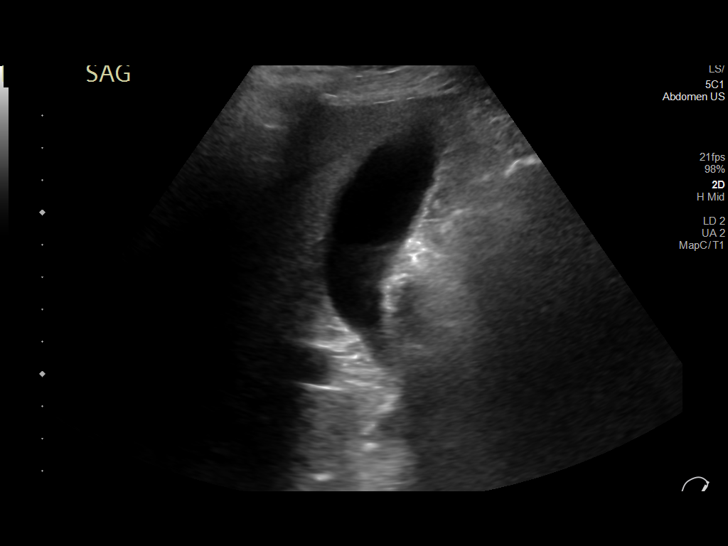
[im 22/48]
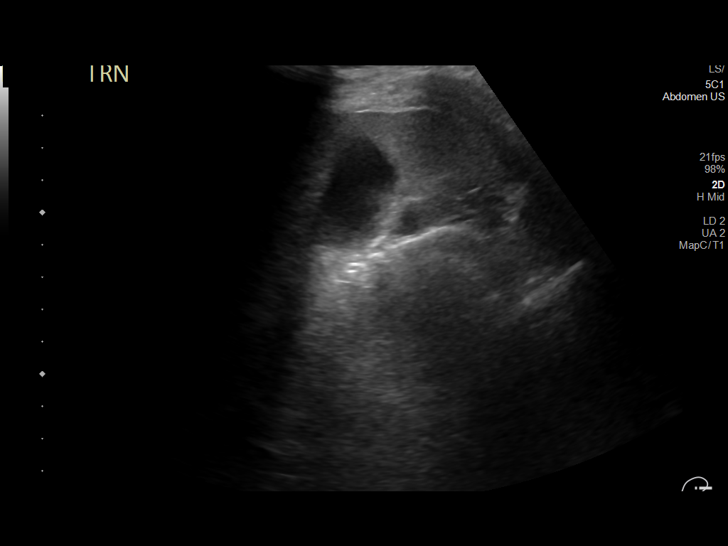
[im 26/48]
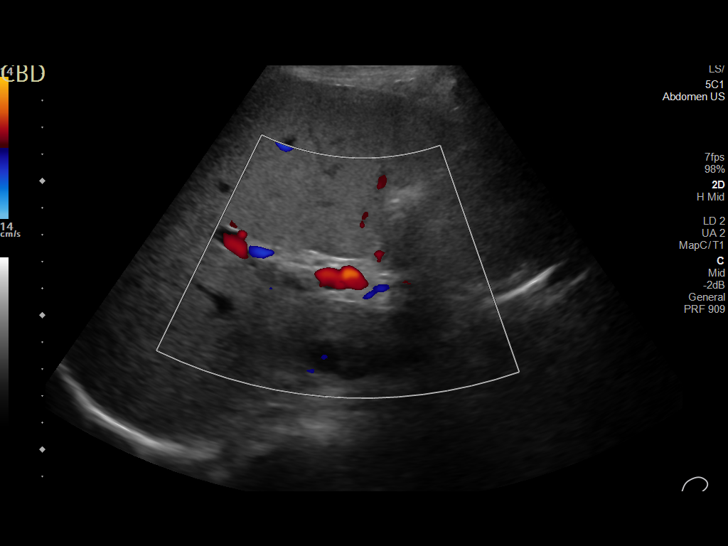
[im 30/48]
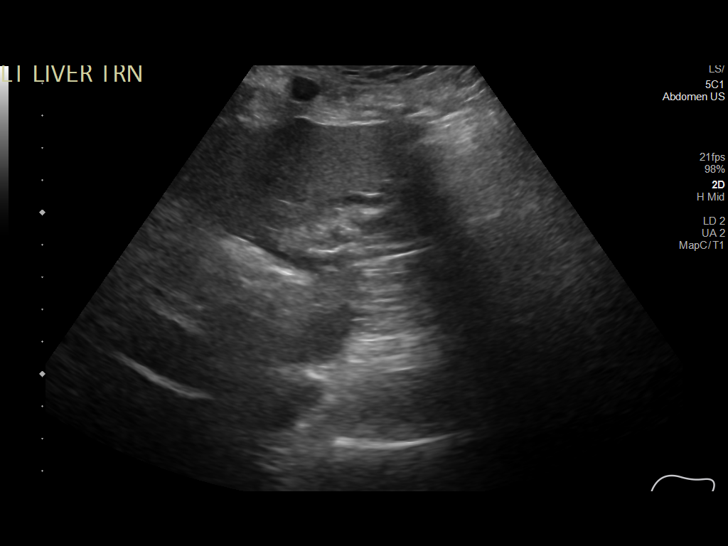
[im 32/48]
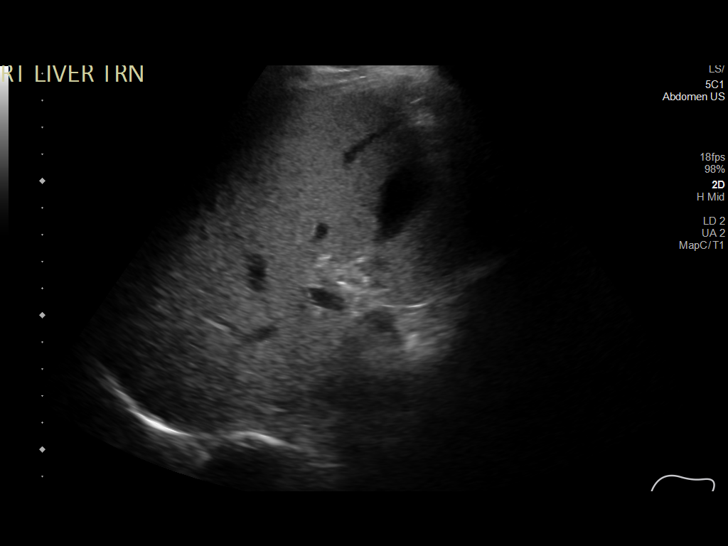
[im 36/48]
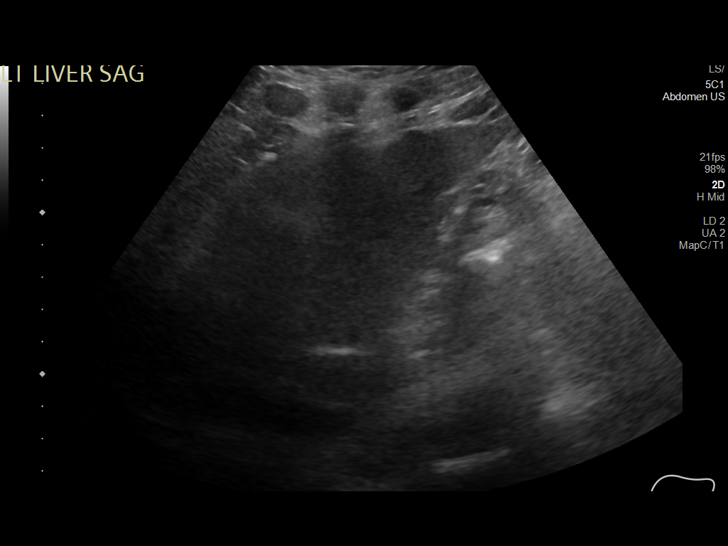
[im 40/48]
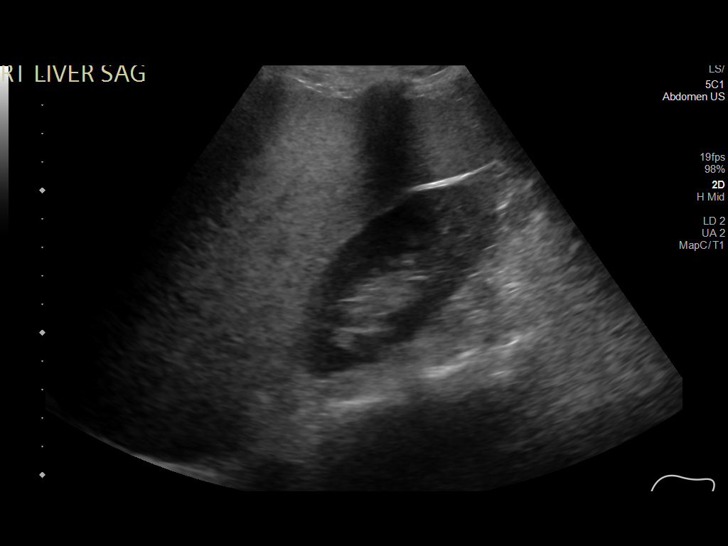
[im 44/48]
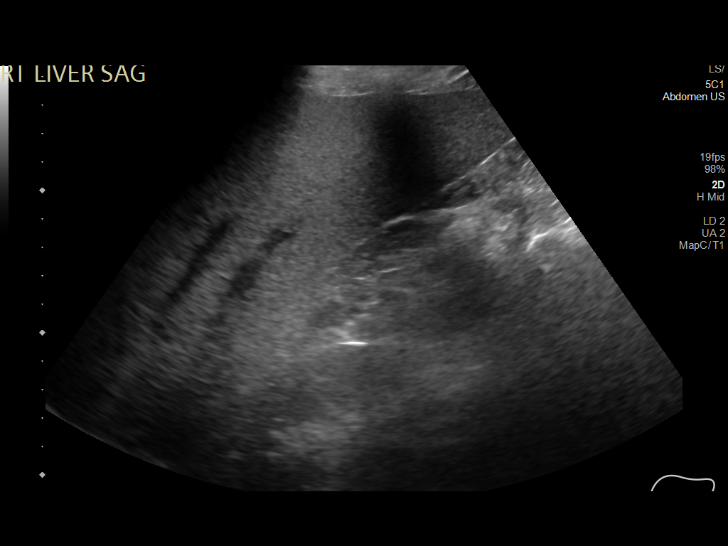
[im 48/48]
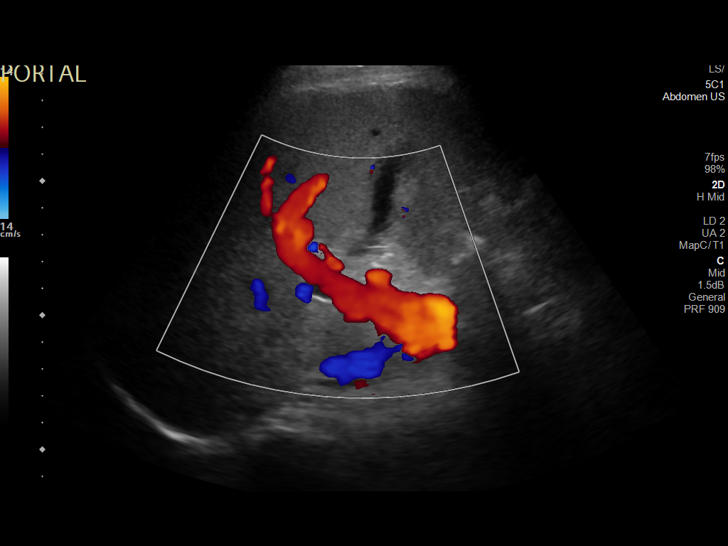

[14 of 25 positions shown; findings below may reference images not displayed]

FINDINGS: Gallbladder:

No gallstones or wall thickening visualized. No sonographic Murphy
sign noted by sonographer.

Common bile duct:

Diameter: 5 mm

Liver:

There is diffuse increased liver echogenicity most commonly seen in
the setting of fatty infiltration. Superimposed inflammation or
fibrosis is not excluded. Clinical correlation is recommended.
Portal vein is patent on color Doppler imaging with normal direction
of blood flow towards the liver.

Other: None.
IMPRESSION: Fatty liver, otherwise unremarkable right upper quadrant ultrasound.

## 2020-09-09 MED ORDER — THIAMINE HCL 100 MG/ML IJ SOLN
Freq: Once | INTRAVENOUS | Status: AC
  Filled 2020-09-09: qty 1000

## 2020-09-09 MED ORDER — LORAZEPAM 2 MG/ML IJ SOLN
1.0000 mg | INTRAMUSCULAR | Status: DC | PRN
  Administered 2020-09-09: 2 mg via INTRAVENOUS
  Filled 2020-09-09: qty 1

## 2020-09-09 MED ORDER — FOLIC ACID 1 MG PO TABS
1.0000 mg | ORAL_TABLET | Freq: Every day | ORAL | Status: DC
  Administered 2020-09-09 – 2020-09-10 (×2): 1 mg via ORAL
  Filled 2020-09-09 (×2): qty 1

## 2020-09-09 MED ORDER — ADULT MULTIVITAMIN W/MINERALS CH
1.0000 | ORAL_TABLET | Freq: Every day | ORAL | Status: DC
  Administered 2020-09-09 – 2020-09-10 (×2): 1 via ORAL
  Filled 2020-09-09 (×2): qty 1

## 2020-09-09 MED ORDER — LORAZEPAM 1 MG PO TABS
1.0000 mg | ORAL_TABLET | ORAL | Status: DC | PRN
  Administered 2020-09-09: 2 mg via ORAL
  Filled 2020-09-09: qty 2

## 2020-09-09 MED ORDER — ENOXAPARIN SODIUM 40 MG/0.4ML IJ SOSY
40.0000 mg | PREFILLED_SYRINGE | INTRAMUSCULAR | Status: DC
  Administered 2020-09-09: 40 mg via SUBCUTANEOUS
  Filled 2020-09-09: qty 0.4

## 2020-09-09 NOTE — Progress Notes (Signed)
Called to room by tech. Patient was bleeding from PIV sites which he removed.  Patient stated the machine was beeping and he knows he is going home. He states he didn't know the PIV's would bleed when he took them out.  Denies feeling anxious.

## 2020-09-09 NOTE — Progress Notes (Signed)
EEG Completed; Results Pending  

## 2020-09-09 NOTE — Procedures (Signed)
Patient Name: Bora Broner  MRN: [id number]  Epilepsy Attending: Charlsie Quest  Referring Physician/Provider: Dr Midge Minium Date: 09/09/2020 Duration: 23.25 mins  Patient history: 50 year old male with transient alteration of awareness.  EEG to evaluate for seizures.  Level of alertness: Awake, asleep  AEDs during EEG study: None  Technical aspects: This EEG study was done with scalp electrodes positioned according to the 10-20 International system of electrode placement. Electrical activity was acquired at a sampling rate of 500Hz  and reviewed with a high frequency filter of 70Hz  and a low frequency filter of 1Hz . EEG data were recorded continuously and digitally stored.   Description: The posterior dominant rhythm consists of 9 Hz activity of moderate voltage (25-35 uV) seen predominantly in posterior head regions, symmetric and reactive to eye opening and eye closing. Sleep was characterized by vertex waves, sleep spindles (12 to 14 Hz), maximal frontocentral region. Hyperventilation and photic stimulation were not performed.     IMPRESSION: This study is within normal limits. No seizures or epileptiform discharges were seen throughout the recording.  Sayre Witherington 

## 2020-09-09 NOTE — Progress Notes (Signed)
[facility] PLAN OF CARE NOTE Patient: Samuel [name] CXK:[id number]   PCP: Patient, No Pcp Per (Inactive) DOB: 1970/10/13   DOA: 09/08/2020   DOS: 09/09/2020    Patient was admitted by my colleague earlier on 09/09/2020. I have reviewed the H&P as well as assessment and plan and agree with the same. Important changes in the plan are listed below.  Plan of care: Principal Problem:   Acute encephalopathy Active Problems:   Electrolyte imbalance MRI negative for any mamillary body issues. B1 levels were not drawn before being giving patient's thiamine. At present Wernicke's risk is low. Patient continues to remain confused somewhat though, unsure about intoxication.  Although alcohol level was normal.  Monitor.  Check UDS.  Likely can go home tomorrow.  Author: Lynden Oxford, MD [facility] 09/09/2020 6:40 PM   If 7PM-7AM, please contact night-coverage at [email or web address]

## 2020-09-09 NOTE — Evaluation (Addendum)
Physical Therapy Evaluation & Discharge Patient Details Name: Samuel [name] MRN: [id number] DOB: 01/23/1970 Today's Date: 09/09/2020   History of Present Illness  50 y/o male presented to ED on 8/23 for possible syncopal episode. Patient found having tremors, confused, and feeling weak. CT head with no acute abnormality. EEG negative. PMH: alcohol abuse  Clinical Impression  PTA, patient states he lives with lady friend and reports independence. Difficult to fully assess cognition due to patient being non [place] speaking. Patient unable to state place or time. Patient functioning at independent level with no AD. No further skilled PT needs required acutely. No PT follow up recommended at this time.     Follow Up Recommendations No PT follow up    Equipment Recommendations  None recommended by PT    Recommendations for Other Services       Precautions / Restrictions Precautions Precautions: None Restrictions Weight Bearing Restrictions: No      Mobility  Bed Mobility Overal bed mobility: Independent                  Transfers Overall transfer level: Independent Equipment used: None                Ambulation/Gait Ambulation/Gait assistance: Independent Gait Distance (Feet): 200 Feet Assistive device: None Gait Pattern/deviations: WFL(Within Functional Limits)        Stairs            Wheelchair Mobility    Modified Rankin (Stroke Patients Only)       Balance Overall balance assessment: Mild deficits observed, not formally tested                                           Pertinent Vitals/Pain Pain Assessment: No/denies pain    Home Living Family/patient expects to be discharged to:: Private residence Living Arrangements: Non-relatives/Friends Available Help at Discharge: Friend(s) Type of Home: House Home Access: Stairs to enter Entrance Stairs-Rails: None Entrance Stairs-Number of Steps: 2-3           Prior Function Level of Independence: Independent               Hand Dominance        Extremity/Trunk Assessment   Upper Extremity Assessment Upper Extremity Assessment: Overall WFL for tasks assessed    Lower Extremity Assessment Lower Extremity Assessment: Overall WFL for tasks assessed    Cervical / Trunk Assessment Cervical / Trunk Assessment: Normal  Communication   Communication: Prefers language other than English;Interpreter utilized (Runner, broadcasting/film/video)  Cognition Arousal/Alertness: Awake/alert Behavior During Therapy: WFL for tasks assessed/performed Overall Cognitive Status: Difficult to assess                                 General Comments: Following all commands appropriately      General Comments      Exercises     Assessment/Plan    PT Assessment Patent does not need any further PT services  PT Problem List         PT Treatment Interventions      PT Goals (Current goals can be found in the Care Plan section)  Acute Rehab PT Goals Patient Stated Goal: to go home PT Goal Formulation: All assessment and education complete, DC therapy    Frequency  Barriers to discharge        Co-evaluation               AM-PAC PT "6 Clicks" Mobility  Outcome Measure Help needed turning from your back to your side while in a flat bed without using bedrails?: None Help needed moving from lying on your back to sitting on the side of a flat bed without using bedrails?: None Help needed moving to and from a bed to a chair (including a wheelchair)?: None Help needed standing up from a chair using your arms (e.g., wheelchair or bedside chair)?: None Help needed to walk in hospital room?: None Help needed climbing 3-5 steps with a railing? : None 6 Click Score: 24    End of Session   Activity Tolerance: Patient tolerated treatment well Patient left: in bed;with call bell/phone within reach;with bed alarm set Nurse  Communication: Mobility status PT Visit Diagnosis: Muscle weakness (generalized) (M62.81)    Time: 6213-0865 PT Time Calculation (min) (ACUTE ONLY): 19 min   Charges:   PT Evaluation $PT Eval Low Complexity: 1 Low          Janisa Labus A. Dan Humphreys PT, DPT Acute Rehabilitation Services Pager [phone] Office [phone]   Viviann Spare 09/09/2020, 2:51 PM

## 2020-09-09 NOTE — H&P (Addendum)
History and Physical    Samuel [name] [address] Betsy Coder XQJ:[id number] DOB: January 06, 1971 DOA: 09/08/2020  PCP: Patient, No Pcp Per (Inactive)  Patient coming from: Home.  Chief Complaint: Possible loss of consciousness.  HPI: Samuel [name] [employer] [name] is a 50 y.o. male with history of alcohol use admitted in June 2022 for traumatic subarachnoid hemorrhage during that admission patient also had T10, T7, rib fractures and due to possible syncope at that time a 2D echo was done which showed EF of 65 to 70% EEG was showing no acute abnormality was brought to the ER after patient's colleagues found that patient was having tremors and feeling weak.  Patient stated that note over the last 3 days patient has been having some epigastric discomfort with no nausea or vomiting.  His pain worsened acutely after he drank some beer today at work.  But when he became tremulous and little confused his friends brought him to the ER.  ED Course: In the ER patient appears confused oriented to his name finding it difficult to ambulate because of dizziness or ataxia CT head unremarkable.  Patient is not able to tell the month and who the president at this time.  No obvious nystagmus on exam.  Patient is able to move all extremities.  Labs show potassium of 3.3 magnesium 1.4 hemoglobin 11.1 CK2 90 high sensitive troponin was 12 lactic acid normal EKG shows prolonged QTC of 503 ms with sinus rhythm.  Alcohol level was negative.  Chest x-ray shows cardiomegaly COVID test negative.  Patient was given potassium replacement and magnesium replacement.  Thiamine levels has been ordered since there was concern for possible Wernicke's encephalopathy and thiamine replacement ordered.    Review of Systems: As per HPI, rest all negative.   Past Medical History:  Diagnosis Date   ETOH abuse     History reviewed. No pertinent surgical history.   reports that he has never smoked. He has never used smokeless tobacco. He  reports current alcohol use of about 72.0 standard drinks per week. No history on file for drug use.  No Known Allergies  History reviewed. No pertinent family history.  Prior to Admission medications   Medication Sig Start Date End Date Taking? Authorizing Provider  acetaminophen (TYLENOL) 325 MG tablet Take 2 tablets (650 mg total) by mouth every 6 (six) hours as needed. Patient not taking: Reported on 09/08/2020 07/21/20   Barnetta Chapel, PA-C  methocarbamol (ROBAXIN) 500 MG tablet Take 2 tablets (1,000 mg total) by mouth every 8 (eight) hours as needed for muscle spasms. Patient not taking: Reported on 09/08/2020 07/21/20   Barnetta Chapel, PA-C  Oxycodone HCl 10 MG TABS Take 0.5-1 tablets (5-10 mg total) by mouth every 6 (six) hours as needed for moderate pain or severe pain. Patient not taking: Reported on 09/08/2020 07/21/20   Barnetta Chapel, PA-C    Physical Exam: Constitutional: Moderately built and nourished. Vitals:   09/08/20 2100 09/08/20 2245 09/08/20 2352 09/09/20 0000  BP: (!) 156/98 135/88 (!) 154/96 (!) 154/96  Pulse: 67 92 66 66  Resp: 18 18 19    Temp:      TempSrc:      SpO2: 96% 96% 97%    Eyes: Anicteric no pallor. ENMT: No discharge from the ears eyes nose and mouth. Neck: No mass felt.  No neck rigidity. Respiratory: No rhonchi or crepitations. Cardiovascular: S1-S2 heard. Abdomen: Soft nontender bowel sound present. Musculoskeletal: No edema. Skin: No rash. Neurologic: Alert awake  oriented to name.  Moving all extremities no obvious nystagmus. Psychiatric: Appears confused.   Labs on Admission: I have personally reviewed following labs and imaging studies  CBC: Recent Labs  Lab 09/08/20 1913 09/08/20 1943  WBC 7.8  --   NEUTROABS 6.8  --   HGB 11.1* 12.6*  HCT 33.4* 37.0*  MCV 97.9  --   PLT 102*  --    Basic Metabolic Panel: Recent Labs  Lab 09/08/20 1913 09/08/20 1943  NA 136 138  K 3.3* 3.4*  CL 99 98  CO2 27  --   GLUCOSE 118* 119*   BUN 5* 4*  CREATININE 0.73 0.60*  CALCIUM 8.7*  --   MG 1.4*  --    GFR: CrCl cannot be calculated (Unknown ideal weight.). Liver Function Tests: Recent Labs  Lab 09/08/20 1913  AST 75*  ALT 43  ALKPHOS 126  BILITOT 0.7  PROT 7.2  ALBUMIN 4.1   No results for input(s): LIPASE, AMYLASE in the last 168 hours. No results for input(s): AMMONIA in the last 168 hours. Coagulation Profile: No results for input(s): INR, PROTIME in the last 168 hours. Cardiac Enzymes: Recent Labs  Lab 09/08/20 1913  CKTOTAL 290   BNP (last 3 results) No results for input(s): PROBNP in the last 8760 hours. HbA1C: No results for input(s): HGBA1C in the last 72 hours. CBG: Recent Labs  Lab 09/08/20 1924  GLUCAP 119*   Lipid Profile: No results for input(s): CHOL, HDL, LDLCALC, TRIG, CHOLHDL, LDLDIRECT in the last 72 hours. Thyroid Function Tests: No results for input(s): TSH, T4TOTAL, FREET4, T3FREE, THYROIDAB in the last 72 hours. Anemia Panel: No results for input(s): VITAMINB12, FOLATE, FERRITIN, TIBC, IRON, RETICCTPCT in the last 72 hours. Urine analysis:    Component Value Date/Time   COLORURINE YELLOW 09/08/2020 1913   APPEARANCEUR CLEAR 09/08/2020 1913   LABSPEC 1.014 09/08/2020 1913   PHURINE 5.0 09/08/2020 1913   GLUCOSEU NEGATIVE 09/08/2020 1913   HGBUR SMALL (A) 09/08/2020 1913   BILIRUBINUR NEGATIVE 09/08/2020 1913   KETONESUR NEGATIVE 09/08/2020 1913   PROTEINUR NEGATIVE 09/08/2020 1913   NITRITE NEGATIVE 09/08/2020 1913   LEUKOCYTESUR NEGATIVE 09/08/2020 1913   Sepsis Labs: @LABRCNTIP (procalcitonin:4,lacticidven:4) )No results found for this or any previous visit (from the past 240 hour(s)).   Radiological Exams on Admission: CT HEAD WO CONTRAST ( )  Result Date: 09/08/2020 CLINICAL DATA:  Mental status changes EXAM: CT HEAD WITHOUT CONTRAST TECHNIQUE: Contiguous axial images were obtained from the base of the skull through the vertex without intravenous  contrast. COMPARISON:  07/03/2020 FINDINGS: Brain: No acute intracranial abnormality. Specifically, no hemorrhage, hydrocephalus, mass lesion, acute infarction, or significant intracranial injury. Vascular: No hyperdense vessel or unexpected calcification. Skull: No acute calvarial abnormality. Sinuses/Orbits: No acute findings Other: None IMPRESSION: No acute intracranial abnormality. Electronically Signed   By: 07/05/2020 M.D.   On: 09/08/2020 23:03   DG Chest Port 1 View  Result Date: 09/08/2020 CLINICAL DATA:  Syncope in a 50 year old male. EXAM: PORTABLE CHEST 1 VIEW COMPARISON:  Comparison is made with CT of the chest, abdomen and pelvis from July 02, 2020. FINDINGS: Heart size is top-normal. Signs of pulmonary vascular congestion. No frank edema. No signs of lobar consolidative changes. EKG leads projecting over the chest. No visible pneumothorax. Gastric distension. On limited assessment no acute skeletal process. IMPRESSION: 1. Mild cardiomegaly with signs of pulmonary vascular congestion. No frank edema or consolidation. 2. Gastric distension. Electronically Signed   By: July 04, 2020  M.D.   On: 09/08/2020 19:47    EKG: Independently reviewed.  Normal sinus rhythm with prolonged QTC.  Assessment/Plan Principal Problem:   Acute encephalopathy Active Problems:   Electrolyte imbalance    Acute encephalopathy cause not clear.  Among the differentials are Wernicke's encephalopathy.  Given the history of alcohol use we will also check ammonia levels.  In addition since patient has anemia we will check B12 folate we will also check TSH RPR.  MRI brain has been ordered along with EEG.  Thiamine 20 mg dosing for possible Medicaid has been ordered. Epigastric discomfort with prior history of alcoholic pancreatitis -patient's AST is mildly elevated.  I have ordered lipase which is an add-on.  I have ordered ultrasound of the right upper quadrant.  We will have further plans based on the labs  ordered and scans ordered.  We will continue on fluids. History of alcohol use keep on CIWA. Anemia -hemoglobin appears to be around her levels in June 2022.  Follow anemia panel. History of possible syncope - 2D echo done in June 2020 was showing normal EF.  EKG shows prolonged QTC in the setting of low potassium and magnesium.  Follow telemetry closely. Prolonged QTC in the setting of low magnesium and potassium will recheck EKG after correction of the electrolytes.  Avoid QT prolonging medications. Mild thrombocytopenia could be related to alcoholism.  Closely follow CBC. Recent admission with traumatic subarachnoid hemorrhage.   DVT prophylaxis: Lovenox. Code Status: Full code. Family Communication: Will need to reach family. Disposition Plan: To be determined. Consults called: None. Admission status: Observation   Eduard Clos MD [facility] Pager [phone].  If 7PM-7AM, please contact night-coverage [email or web address] Password [facility] [place] [facility]  09/09/2020, 12:09 AM

## 2020-09-10 ENCOUNTER — Other Ambulatory Visit (HOSPITAL_COMMUNITY): Payer: Self-pay

## 2020-09-10 LAB — CBC
HCT: 38 % — ABNORMAL LOW (ref 39.0–52.0)
Hemoglobin: 12.9 g/dL — ABNORMAL LOW (ref 13.0–17.0)
MCH: 32.1 pg (ref 26.0–34.0)
MCHC: 33.9 g/dL (ref 30.0–36.0)
MCV: 94.5 fL (ref 80.0–100.0)
Platelets: 118 10*3/uL — ABNORMAL LOW (ref 150–400)
RBC: 4.02 MIL/uL — ABNORMAL LOW (ref 4.22–5.81)
RDW: 13.2 % (ref 11.5–15.5)
WBC: 4.1 10*3/uL (ref 4.0–10.5)
nRBC: 0 % (ref 0.0–0.2)

## 2020-09-10 LAB — BASIC METABOLIC PANEL
Anion gap: 9 (ref 5–15)
BUN: 6 mg/dL (ref 6–20)
CO2: 27 mmol/L (ref 22–32)
Calcium: 9.5 mg/dL (ref 8.9–10.3)
Chloride: 100 mmol/L (ref 98–111)
Creatinine, Ser: 0.61 mg/dL (ref 0.61–1.24)
GFR, Estimated: >60 mL/min (ref >60)
Glucose, Bld: 111 mg/dL — ABNORMAL HIGH (ref 70–99)
Potassium: 3.2 mmol/L — ABNORMAL LOW (ref 3.5–5.1)
Sodium: 136 mmol/L (ref 135–145)

## 2020-09-10 MED ORDER — FOLIC ACID 1 MG PO TABS
1.0000 mg | ORAL_TABLET | Freq: Every day | ORAL | 0 refills | Status: DC
  Filled 2020-09-10: qty 30, 30d supply, fill #0

## 2020-09-10 MED ORDER — POTASSIUM CHLORIDE CRYS ER 20 MEQ PO TBCR
40.0000 meq | EXTENDED_RELEASE_TABLET | ORAL | Status: AC
Start: 2020-09-10 — End: 2020-09-10
  Administered 2020-09-10 (×2): 40 meq via ORAL
  Filled 2020-09-10 (×2): qty 2

## 2020-09-10 MED ORDER — CERTAVITE/ANTIOXIDANTS PO TABS
1.0000 | ORAL_TABLET | Freq: Every day | ORAL | 0 refills | Status: DC
  Filled 2020-09-10: qty 30, 30d supply, fill #0

## 2020-09-10 MED ORDER — THIAMINE HCL 100 MG PO TABS
100.0000 mg | ORAL_TABLET | Freq: Every day | ORAL | 0 refills | Status: DC
  Filled 2020-09-10: qty 30, 30d supply, fill #0

## 2020-09-10 NOTE — Progress Notes (Signed)
Patient trying to walk out the door.   PIV removed. Verbalized understanding of DC instructions, medication regimen and follow up appointments. Was given his medications from pharmacy.  Taken to DC area via wheelchair by RN to waiting ride.

## 2020-09-11 LAB — VITAMIN B1: Vitamin B1 (Thiamine): 92.6 nmol/L (ref 66.5–200.0)

## 2020-09-11 NOTE — Discharge Summary (Signed)
[facility] Discharge Summary   Patient: Samuel [name] HCW:[id number]  PCP: Patient, No Pcp Per (Inactive)  Date of admission: 09/08/2020   Date of discharge: 09/10/2020      Discharge Diagnoses:  Principal Problem:   Acute encephalopathy Active Problems:   Electrolyte imbalance  Admitted From: Home Disposition:  Home   Recommendations for Outpatient Follow-up:  PCP: Follow-up with PCP in 1 week.   Follow-up Information     [facility]. Schedule an appointment as soon as possible for a visit in 1 week(s).   Contact information: [address], [address] [place] [address]               Discharge Instructions     Diet - low sodium heart healthy   Complete by: As directed    Increase activity slowly   Complete by: As directed        Diet recommendation: Regular diet  Activity: The patient is advised to gradually reintroduce usual activities, as tolerated  Discharge Condition: stable  Code Status: Full code   History of present illness: As per the H and P dictated on admission, "Samuel [name] is a 50 y.o. male with history of alcohol use admitted in June 2022 for traumatic subarachnoid hemorrhage during that admission patient also had T10, T7, rib fractures and due to possible syncope at that time a 2D echo was done which showed EF of 65 to 70% EEG was showing no acute abnormality was brought to the ER after patient's colleagues found that patient was having tremors and feeling weak.  Patient stated that note over the last 3 days patient has been having some epigastric discomfort with no nausea or vomiting.  His pain worsened acutely after he drank some beer today at work.  But when he became tremulous and little confused his friends brought him to the ER.   ED Course: In the ER patient appears confused oriented to his name finding it difficult to ambulate because of dizziness or ataxia CT  head unremarkable.  Patient is not able to tell the month and who the president at this time.  No obvious nystagmus on exam.  Patient is able to move all extremities.  Labs show potassium of 3.3 magnesium 1.4 hemoglobin 11.1 CK2 90 high sensitive troponin was 12 lactic acid normal EKG shows prolonged QTC of 503 ms with sinus rhythm.  Alcohol level was negative.  Chest x-ray shows cardiomegaly COVID test negative.  Patient was given potassium replacement and magnesium replacement.  Thiamine levels has been ordered since there was concern for possible Wernicke's encephalopathy and thiamine replacement ordered.  "  Hospital Course:  Summary of his active problems in the hospital is as following. Acute metabolic encephalopathy  likely alcohol induced intoxication. Alcohol level negative. UDS negative other than benzodiazepine which patient received in the hospital. There is suspicion for Wernicke's on admission although MRI brain negative for any mamillary body issues. Thiamine level is also adequate. Will continue with oral thiamine on discharge. Other metabolic work-up negative.  Patient is able to ambulate with physical therapy.  No asterixis.  CIWA score improved.  Alcohol abuse. Recommended patient to minimize alcohol drinking.  Epigastric discomfort. Currently resolved. Outpatient follow-up with PCP.  Anemia likely nutritional deficiency. No active bleeding reported by the patient.  Prolonged QTC. Corrected. Outpatient follow-up.  Thrombocytopenia. Related to alcohol abuse. Recommended to minimize alcohol drinking.  History of traumatic subarachnoid hemorrhage with  recent admission. DVT prophylaxis was held.   Patient was seen by physical therapy, who recommended No therapy needed on discharge. On the day of the discharge the patient's vitals were stable, and no other new acute medical condition were reported. The patient was felt safe to be discharge at Home with no therapy  needed on discharge.  Consultants: none Procedures: none  DISCHARGE MEDICATION: Allergies as of 09/10/2020   No Known Allergies      Medication List     STOP taking these medications    methocarbamol 500 MG tablet Commonly known as: ROBAXIN   Oxycodone HCl 10 MG Tabs       TAKE these medications    acetaminophen 325 MG tablet Commonly known as: TYLENOL Take 2 tablets (650 mg total) by mouth every 6 (six) hours as needed.   CertaVite/Antioxidants Tabs Tome 1 tableta por va oral diariamente. (Take 1 tablet by mouth daily.)   folic acid 1 MG tablet Commonly known as: FOLVITE Tome 1 tableta (1 mg en total) por va oral diariamente. (Take 1 tablet (1 mg total) by mouth daily.)   thiamine 100 MG tablet Tome 1 tableta (100 mg en total) por va oral diariamente. (Take 1 tablet (100 mg total) by mouth daily.)        Discharge Exam: Filed Weights   09/09/20 1055  Weight: 58.3 kg   Vitals:   09/10/20 0802 09/10/20 1142  BP: (!) 144/91 118/81  Pulse: 78 87  Resp: 18 16  Temp: 98.3 F (36.8 C) 98.3 F (36.8 C)  SpO2: 96% 99%   General: Appear in mild distress, no Rash; Oral Mucosa Clear, moist. no Abnormal Neck Mass Or lumps, Conjunctiva normal  Cardiovascular: S1 and S2 Present, no Murmur, Respiratory: good respiratory effort, Bilateral Air entry present and CTA, no Crackles, no wheezes Abdomen: Bowel Sound present, Soft and no tenderness Extremities: no Pedal edema Neurology: alert and oriented to time, place, and person affect appropriate. no new focal deficit Gait not checked due to patient safety concerns  The results of significant diagnostics from this hospitalization (including imaging, microbiology, ancillary and laboratory) are listed below for reference.    Significant Diagnostic Studies: CT HEAD WO CONTRAST ( )  Result Date: 09/08/2020 CLINICAL DATA:  Mental status changes EXAM: CT HEAD WITHOUT CONTRAST TECHNIQUE: Contiguous axial images  were obtained from the base of the skull through the vertex without intravenous contrast. COMPARISON:  07/03/2020 FINDINGS: Brain: No acute intracranial abnormality. Specifically, no hemorrhage, hydrocephalus, mass lesion, acute infarction, or significant intracranial injury. Vascular: No hyperdense vessel or unexpected calcification. Skull: No acute calvarial abnormality. Sinuses/Orbits: No acute findings Other: None IMPRESSION: No acute intracranial abnormality. Electronically Signed   By: Charlett Nose M.D.   On: 09/08/2020 23:03   MR BRAIN WO CONTRAST  Result Date: 09/09/2020 CLINICAL DATA:  Initial evaluation for acute mental status change, unknown cause. EXAM: MRI HEAD WITHOUT CONTRAST TECHNIQUE: Multiplanar, multiecho pulse sequences of the brain and surrounding structures were obtained without intravenous contrast. COMPARISON:  CT from 09/08/2020 as well as multiple recent exams. FINDINGS: Brain: Examination somewhat limited by motion artifact. Additionally, a coronal T2 weighted sequence is not provided. Generalized cerebral atrophy, advanced for age. Cerebellar atrophy noted as well, which may be in part related to history of alcohol abuse. Mild chronic microvascular ischemic disease noted involving the periventricular and deep white matter both cerebral hemispheres. No abnormal foci of restricted diffusion to suggest acute or subacute ischemia. Gray-white matter differentiation maintained. No encephalomalacia to  suggest chronic cortical infarction. No acute intracranial hemorrhage. 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 No acute hemorrhage seen within this region on most recent CT. Finding presumably related to history of recent subarachnoid hemorrhage seen on prior CT from 07/02/2020. No other evidence for acute or chronic intracranial hemorrhage. No mass lesion, midline shift or mass effect. No hydrocephalus or extra-axial fluid collection.  Pituitary gland is mildly irregular without definite discrete lesion. Midline structures intact. Vascular: Major intracranial vascular flow voids are maintained. Skull and upper cervical spine: Craniocervical junction within normal limits. Bone marrow signal intensity within normal limits. Probable small scalp contusion at the right parietal vertex. Sinuses/Orbits: Globes and orbital soft tissues demonstrate no acute finding. Scattered mucosal thickening noted within the ethmoidal air cells and maxillary sinuses. Mastoid air cells are largely clear. Inner ear structures grossly normal. Other: None. IMPRESSION: 1. No acute intracranial abnormality. 2. 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 No definite acute intracranial hemorrhage by MRI. 3. Right parietal scalp contusion. 4. Age advanced cerebral and cerebellar atrophy with mild chronic microvascular ischemic disease. Electronically Signed   By: Rise Mu M.D.   On: 09/09/2020 02:53   DG Chest Port 1 View  Result Date: 09/08/2020 CLINICAL DATA:  Syncope in a 50 year old male. EXAM: PORTABLE CHEST 1 VIEW COMPARISON:  Comparison is made with CT of the chest, abdomen and pelvis from July 02, 2020. FINDINGS: Heart size is top-normal. Signs of pulmonary vascular congestion. No frank edema. No signs of lobar consolidative changes. EKG leads projecting over the chest. No visible pneumothorax. Gastric distension. On limited assessment no acute skeletal process. IMPRESSION: 1. Mild cardiomegaly with signs of pulmonary vascular congestion. No frank edema or consolidation. 2. Gastric distension. Electronically Signed   By: Donzetta Kohut M.D.   On: 09/08/2020 19:47   EEG adult  Result Date: 09/09/2020 Charlsie Quest, MD     09/09/2020  9:58 AM Patient Name: [facility] Sherman Donaldson MRN: [id number] Epilepsy Attending: Charlsie Quest  Referring Physician/Provider: Dr Midge Minium Date: 09/09/2020 Duration: 23.25 mins Patient history: 50 year old male with transient alteration of awareness.  EEG to evaluate for seizures. Level of alertness: Awake, asleep AEDs during EEG study: None Technical aspects: This EEG study was done with scalp electrodes positioned according to the 10-20 International system of electrode placement. Electrical activity was acquired at a sampling rate of 500Hz  and reviewed with a high frequency filter of 70Hz  and a low frequency filter of 1Hz . EEG data were recorded continuously and digitally stored. Description: The posterior dominant rhythm consists of 9 Hz activity of moderate voltage (25-35 uV) seen predominantly in posterior head regions, symmetric and reactive to eye opening and eye closing. Sleep was characterized by vertex waves, sleep spindles (12 to 14 Hz), maximal frontocentral region. Hyperventilation and photic stimulation were not performed.   IMPRESSION: This study is within normal limits. No seizures or epileptiform discharges were seen throughout the recording. Priyanka   Abdomen Limited RUQ (LIVER/GB)  Result Date: 09/09/2020 CLINICAL DATA:  Right upper quadrant abdominal pain. EXAM: ULTRASOUND ABDOMEN LIMITED RIGHT UPPER QUADRANT COMPARISON:  None. FINDINGS: Gallbladder: No gallstones or wall thickening visualized. No sonographic Murphy sign noted by sonographer. Common bile duct: Diameter: 5 mm Liver: There is diffuse increased liver echogenicity most commonly seen in the setting of fatty infiltration. Superimposed inflammation or fibrosis is  not excluded. Clinical correlation is recommended. Portal vein is patent on color Doppler imaging with normal direction of blood flow towards the liver. Other: None. IMPRESSION: Fatty liver, otherwise unremarkable right upper quadrant ultrasound. Electronically Signed   By: Elgie Collard M.D.   On: 09/09/2020 01:39    Microbiology: Recent  Results (from the past 240 hour(s))  Resp Panel by RT-PCR (Flu A&B, Covid) Nasopharyngeal Swab     Status: None   Collection Time: 09/08/20 12:36 AM   Specimen: Nasopharyngeal Swab; Nasopharyngeal(NP) swabs in vial transport medium  Result Value Ref Range Status   SARS Coronavirus 2 by RT PCR NEGATIVE NEGATIVE Final    Comment: (NOTE) SARS-CoV-2 target nucleic acids are NOT DETECTED.  The SARS-CoV-2 RNA is generally detectable in upper respiratory specimens during the acute phase of infection. The lowest concentration of SARS-CoV-2 viral copies this assay can detect is 138 copies/mL. A negative result does not preclude SARS-Cov-2 infection and should not be used as the sole basis for treatment or other patient management decisions. A negative result may occur with  improper specimen collection/handling, submission of specimen other than nasopharyngeal swab, presence of viral mutation(s) within the areas targeted by this assay, and inadequate number of viral copies(<138 copies/mL). A negative result must be combined with clinical observations, patient history, and epidemiological information. The expected result is Negative.  Fact Sheet for Patients:  [name]  Fact Sheet for Healthcare Providers:  [name]  This test is no t yet approved or cleared by the [name] FDA and  has been authorized for detection and/or diagnosis of SARS-CoV-2 by FDA under an Emergency Use Authorization (EUA). This EUA will remain  in effect (meaning this test can be used) for the duration of the COVID-19 declaration under Section 564(b)(1) of the Act, 21 U.S.C.section 360bbb-3(b)(1), unless the authorization is terminated  or revoked sooner.       Influenza A by PCR NEGATIVE NEGATIVE Final   Influenza B by PCR NEGATIVE NEGATIVE Final    Comment: (NOTE) The Xpert Xpress SARS-CoV-2/FLU/RSV plus assay is intended as an aid in the  diagnosis of influenza from Nasopharyngeal swab specimens and should not be used as a sole basis for treatment. Nasal washings and aspirates are unacceptable for Xpert Xpress SARS-CoV-2/FLU/RSV testing.  Fact Sheet for Patients: [name]  Fact Sheet for Healthcare Providers: [name]  This test is not yet approved or cleared by the [name] FDA and has been authorized for detection and/or diagnosis of SARS-CoV-2 by FDA under an Emergency Use Authorization (EUA). This EUA will remain in effect (meaning this test can be used) for the duration of the COVID-19 declaration under Section 564(b)(1) of the Act, 21 U.S.C. section 360bbb-3(b)(1), unless the authorization is terminated or revoked.  Performed at [facility], [address] [place], [name] [address]      Labs: CBC: Recent Labs  Lab 09/08/20 1913 09/08/20 1943 09/09/20 0321 09/10/20 0404  WBC 7.8  --  5.0 4.1  NEUTROABS 6.8  --   --   --   HGB 11.1* 12.6* 12.0* 12.9*  HCT 33.4* 37.0* 35.4* 38.0*  MCV 97.9  --  97.3 94.5  PLT 102*  --  100* 118*   Basic Metabolic Panel: Recent Labs  Lab 09/08/20 1913 09/08/20 1943 09/09/20 0321 09/10/20 0404  NA 136 138 137 136  K 3.3* 3.4* 3.9 3.2*  CL 99 98 100 100  CO2 27  --  28 27  GLUCOSE 118* 119* 96 111*  BUN 5* 4* <5* 6  CREATININE 0.73 0.60* 0.57* 0.61  CALCIUM 8.7*  --  9.0 9.5  MG 1.4*  --  2.3  --    Liver Function Tests: Recent Labs  Lab 09/08/20 1913 09/09/20 0321  AST 75* 67*  ALT 43 37  ALKPHOS 126 115  BILITOT 0.7 0.7  PROT 7.2 7.1  ALBUMIN 4.1 3.9   CBG: Recent Labs  Lab 09/08/20 1924  GLUCAP 119*    Time spent: 35 minutes  Signed:  Lynden OxfordPranav Kendrik Mcshan  [facility] 09/10/2020

## 2021-09-02 ENCOUNTER — Other Ambulatory Visit: Payer: Self-pay

## 2021-09-02 ENCOUNTER — Emergency Department (HOSPITAL_COMMUNITY): Payer: Self-pay

## 2021-09-02 ENCOUNTER — Emergency Department (HOSPITAL_COMMUNITY)
Admission: EM | Admit: 2021-09-02 | Discharge: 2021-09-02 | Disposition: A | Discharge status: Home / Self Care | Payer: Self-pay | Attending: Emergency Medicine | Admitting: Emergency Medicine

## 2021-09-02 DIAGNOSIS — M25561 Pain in right knee: Secondary | ICD-10-CM | POA: Insufficient documentation

## 2021-09-02 DIAGNOSIS — Y907 Blood alcohol level of 200-239 mg/100 ml: Secondary | ICD-10-CM | POA: Insufficient documentation

## 2021-09-02 DIAGNOSIS — F10920 Alcohol use, unspecified with intoxication, uncomplicated: Secondary | ICD-10-CM

## 2021-09-02 DIAGNOSIS — W1839XA Other fall on same level, initial encounter: Secondary | ICD-10-CM | POA: Insufficient documentation

## 2021-09-02 DIAGNOSIS — F10120 Alcohol abuse with intoxication, uncomplicated: Secondary | ICD-10-CM | POA: Insufficient documentation

## 2021-09-02 DIAGNOSIS — R0781 Pleurodynia: Secondary | ICD-10-CM | POA: Insufficient documentation

## 2021-09-02 DIAGNOSIS — E876 Hypokalemia: Secondary | ICD-10-CM | POA: Insufficient documentation

## 2021-09-02 LAB — BASIC METABOLIC PANEL
Anion gap: 9 (ref 5–15)
BUN: <5 mg/dL — ABNORMAL LOW (ref 6–20)
CO2: 24 mmol/L (ref 22–32)
Calcium: 8.5 mg/dL — ABNORMAL LOW (ref 8.9–10.3)
Chloride: 99 mmol/L (ref 98–111)
Creatinine, Ser: 0.43 mg/dL — ABNORMAL LOW (ref 0.61–1.24)
GFR, Estimated: >60 mL/min (ref >60)
Glucose, Bld: 106 mg/dL — ABNORMAL HIGH (ref 70–99)
Potassium: 2.8 mmol/L — ABNORMAL LOW (ref 3.5–5.1)
Sodium: 132 mmol/L — ABNORMAL LOW (ref 135–145)

## 2021-09-02 LAB — CBC WITH DIFFERENTIAL/PLATELET
Abs Immature Granulocytes: 0.03 10*3/uL (ref 0.00–0.07)
Basophils Absolute: 0 10*3/uL (ref 0.0–0.1)
Basophils Relative: 1 %
Eosinophils Absolute: 0.1 10*3/uL (ref 0.0–0.5)
Eosinophils Relative: 1 %
HCT: 32.9 % — ABNORMAL LOW (ref 39.0–52.0)
Hemoglobin: 11.8 g/dL — ABNORMAL LOW (ref 13.0–17.0)
Immature Granulocytes: 0 %
Lymphocytes Relative: 16 %
Lymphs Abs: 1.2 10*3/uL (ref 0.7–4.0)
MCH: 34.1 pg — ABNORMAL HIGH (ref 26.0–34.0)
MCHC: 35.9 g/dL (ref 30.0–36.0)
MCV: 95.1 fL (ref 80.0–100.0)
Monocytes Absolute: 0.7 10*3/uL (ref 0.1–1.0)
Monocytes Relative: 9 %
Neutro Abs: 5.7 10*3/uL (ref 1.7–7.7)
Neutrophils Relative %: 73 %
Platelets: 101 10*3/uL — ABNORMAL LOW (ref 150–400)
RBC: 3.46 MIL/uL — ABNORMAL LOW (ref 4.22–5.81)
RDW: 14.5 % (ref 11.5–15.5)
WBC: 7.7 10*3/uL (ref 4.0–10.5)
nRBC: 0 % (ref 0.0–0.2)

## 2021-09-02 LAB — RAPID URINE DRUG SCREEN, HOSP PERFORMED
Amphetamines: NOT DETECTED
Barbiturates: NOT DETECTED
Benzodiazepines: NOT DETECTED
Cocaine: NOT DETECTED
Opiates: NOT DETECTED
Tetrahydrocannabinol: NOT DETECTED

## 2021-09-02 LAB — CBG MONITORING, ED
Glucose-Capillary: 85 mg/dL (ref 70–99)
Glucose-Capillary: 128 mg/dL — ABNORMAL HIGH (ref 70–99)

## 2021-09-02 LAB — ETHANOL: Alcohol, Ethyl (B): 234 mg/dL — ABNORMAL HIGH (ref <10)

## 2021-09-02 MED ORDER — ACETAMINOPHEN 325 MG PO TABS
650.0000 mg | ORAL_TABLET | Freq: Once | ORAL | Status: AC
  Administered 2021-09-02: 650 mg via ORAL
  Filled 2021-09-02: qty 2

## 2021-09-02 MED ORDER — POTASSIUM CHLORIDE 20 MEQ PO PACK
40.0000 meq | PACK | Freq: Once | ORAL | Status: AC
Start: 2021-09-02 — End: 2021-09-02
  Administered 2021-09-02: 40 meq via ORAL
  Filled 2021-09-02: qty 2

## 2021-09-02 MED ORDER — KETOROLAC TROMETHAMINE 30 MG/ML IJ SOLN
30.0000 mg | Freq: Once | INTRAMUSCULAR | Status: AC
  Administered 2021-09-02: 30 mg via INTRAMUSCULAR
  Filled 2021-09-02: qty 1

## 2021-09-02 MED ORDER — IBUPROFEN 600 MG PO TABS: 600.0000 mg | ORAL_TABLET | Freq: Four times a day (QID) | ORAL | 0 refills | Status: DC | PRN

## 2021-09-02 NOTE — ED Provider Triage Note (Signed)
Emergency Medicine Provider Triage Evaluation Note  Samuel [name] Samuel [name] Samuel [name] , a 51 y.o. male  was evaluated in triage.  Pt complains of fall, states that he had 3 tall beers lost his balance and fell on his knees, states his right knee is hurting worse in his left knee still able ambulate, denies hitting his head losing conscious, he is on anticoag's.  He also notes that he is having some left rib pain states that he injured it a while back and thinks that he might of hurt it again, no shortness of breath.  Review of Systems  Positive: Knee pain, rib pain Negative: Headaches, change in vision  Physical Exam  BP (!) 128/100 (BP Location: Left Arm)   Pulse 85   Temp 98.9 F (37.2 C)   Resp 18   Ht 5\' 4"  (1.626 m)   Wt 58.1 kg   SpO2 99%   BMI 21.97 kg/m  Gen:   Awake, no distress   Resp:  Normal effort  MSK:   Moves extremities without difficulty  Other:  Cranial nerves II through XII grossly intact no difficulty with word finding following two-step commands no regular weakness present.  Medical Decision Making  Medically screening exam initiated at 2:24 AM.  Appropriate orders placed.  Samuel [name] Samuel [name] Samuel [name] was informed that the remainder of the evaluation will be completed by another provider, this initial triage assessment does not replace that evaluation, and the importance of remaining in the ED until their evaluation is complete.  Lab work imaging been ordered will need further work-up.   Samuel Standard, PA-C 09/02/21 0225

## 2021-09-02 NOTE — ED Provider Notes (Signed)
[facility] EMERGENCY DEPARTMENT Provider Note   CSN: [id number] Arrival date & time: 09/02/21  0202     History  Chief Complaint  Patient presents with   [name] is a 51 y.o. male.  Patient is a 51 year old Spanish-speaking male presenting for knee and rib pain.  Patient admits to alcoholism with previous falls most recently including fall prior to arrival with point of contact being his bilateral knees.  Admits to abrasion to the right knee with otherwise knee pain.  Able to ambulate and bear weight though.  Patient also reports to falls over the last several weeks including blunt head trauma.  Denies any LOC or blood thinner use.  Denies any sensation or motor deficits at this time.  States from one of his subsequent falls he obtains left-sided rib pain without bruising.  Any previous imaging or work-up.  The history is provided by the patient. No language interpreter was used.  Fall Pertinent negatives include no chest pain, no abdominal pain and no shortness of breath.       Home Medications Prior to Admission medications   Medication Sig Start Date End Date Taking? Authorizing Provider  ibuprofen (ADVIL) 600 MG tablet Take 1 tablet (600 mg total) by mouth every 6 (six) hours as needed. 09/02/21  Yes Edwin Dada P, DO  acetaminophen (TYLENOL) 325 MG tablet Take 2 tablets (650 mg total) by mouth every 6 (six) hours as needed. Patient not taking: Reported on 09/08/2020 07/21/20   Barnetta Chapel, PA-C  folic acid (FOLVITE) 1 MG tablet Take 1 tablet (1 mg total) by mouth daily. 09/11/20   Rolly Salter, MD  Multiple Vitamins-Minerals (CERTAVITE/ANTIOXIDANTS) TABS Take 1 tablet by mouth daily. 09/11/20   Rolly Salter, MD  thiamine 100 MG tablet Take 1 tablet (100 mg total) by mouth daily. 09/10/20   Rolly Salter, MD      Allergies    Patient has no known allergies.    Review of Systems   Review of Systems  Constitutional:   Negative for chills and fever.  HENT:  Negative for ear pain and sore throat.   Eyes:  Negative for pain and visual disturbance.  Respiratory:  Negative for cough and shortness of breath.   Cardiovascular:  Negative for chest pain and palpitations.  Gastrointestinal:  Negative for abdominal pain and vomiting.  Genitourinary:  Negative for dysuria and hematuria.  Musculoskeletal:  Negative for arthralgias and back pain.  Skin:  Negative for color change and rash.  Neurological:  Negative for seizures and syncope.  All other systems reviewed and are negative.   Physical Exam Updated Vital Signs BP (!) 140/99 (BP Location: Right Arm)   Pulse (!) 101   Temp 97.7 F (36.5 C) (Oral)   Resp 12   Ht 5\' 4"  (1.626 m)   Wt 58.1 kg   SpO2 98%   BMI 21.97 kg/m  Physical Exam Vitals and nursing note reviewed.  Constitutional:      General: He is not in acute distress.    Appearance: He is well-developed.  HENT:     Head: Normocephalic and atraumatic.  Eyes:     Conjunctiva/sclera: Conjunctivae normal.  Cardiovascular:     Rate and Rhythm: Normal rate and regular rhythm.     Pulses:          Dorsalis pedis pulses are 2+ on the right side and 2+ on the left side.  Heart sounds: No murmur heard. Pulmonary:     Effort: Pulmonary effort is normal. No respiratory distress.     Breath sounds: Normal breath sounds.  Chest:    Abdominal:     Palpations: Abdomen is soft.     Tenderness: There is no abdominal tenderness.  Musculoskeletal:        General: No swelling.     Cervical back: Neck supple.       Legs:  Skin:    General: Skin is warm and dry.     Capillary Refill: Capillary refill takes less than 2 seconds.  Neurological:     General: No focal deficit present.     Mental Status: He is alert and oriented to person, place, and time.     GCS: GCS eye subscore is 4. GCS verbal subscore is 5. GCS motor subscore is 6.     Cranial Nerves: Cranial nerves 2-12 are intact.      Sensory: Sensation is intact.     Motor: Motor function is intact.     Coordination: Coordination is intact.  Psychiatric:        Mood and Affect: Mood normal.     ED Results / Procedures / Treatments   Labs (all labs ordered are listed, but only abnormal results are displayed) Labs Reviewed  ETHANOL - Abnormal; Notable for the following components:      Result Value   Alcohol, Ethyl (B) 234 (*)    All other components within normal limits  BASIC METABOLIC PANEL - Abnormal; Notable for the following components:   Sodium 132 (*)    Potassium 2.8 (*)    Glucose, Bld 106 (*)    BUN <5 (*)    Creatinine, Ser 0.43 (*)    Calcium 8.5 (*)    All other components within normal limits  CBC WITH DIFFERENTIAL/PLATELET - Abnormal; Notable for the following components:   RBC 3.46 (*)    Hemoglobin 11.8 (*)    HCT 32.9 (*)    MCH 34.1 (*)    Platelets 101 (*)    All other components within normal limits  CBG MONITORING, ED - Abnormal; Notable for the following components:   Glucose-Capillary 128 (*)    All other components within normal limits  RAPID URINE DRUG SCREEN, HOSP PERFORMED  CBG MONITORING, ED    EKG None  Radiology CT Head Wo Contrast  Result Date: 09/02/2021 CLINICAL DATA:  Head and neck trauma, fall. EXAM: CT HEAD WITHOUT CONTRAST CT CERVICAL SPINE WITHOUT CONTRAST TECHNIQUE: Multidetector CT imaging of the head and cervical spine was performed following the [name] protocol without intravenous contrast. Multiplanar CT image reconstructions of the cervical spine were also generated. RADIATION DOSE REDUCTION: This exam was performed according to the departmental dose-optimization program which includes automated exposure control, adjustment of the mA and/or kV according to patient size and/or use of iterative reconstruction technique. COMPARISON:  09/09/2020, 09/08/2020, 07/02/2020, 07/03/2020. FINDINGS: CT HEAD FINDINGS Brain: No acute intracranial hemorrhage, midline  shift or mass effect. No extra-axial fluid collection. Diffuse atrophy is noted. Periventricular white matter hypodensities are present bilaterally. No hydrocephalus. Vascular: No hyperdense vessel or unexpected calcification. Skull: Normal. Negative for fracture or focal lesion. Sinuses/Orbits: Mild mucosal thickening is present in the ethmoid air cells. No acute orbital abnormality. Other: None. CT CERVICAL SPINE FINDINGS Alignment: Normal. Skull base and vertebrae: No acute fracture. No primary bone lesion or focal pathologic process. Soft tissues and spinal canal: No prevertebral fluid or swelling. No visible  canal hematoma. Disc levels: Degenerative endplate osteophyte formation at multiple levels. Upper chest: No acute abnormality. Other: None. IMPRESSION: 1. No acute intracranial process. 2. Atrophy with chronic microvascular ischemic changes. 3. Mild degenerative changes in the cervical spine without evidence of acute fracture. Electronically Signed   By: Thornell Sartorius M.D.   On: 09/02/2021 03:29   CT Cervical Spine Wo Contrast  Result Date: 09/02/2021 CLINICAL DATA:  Head and neck trauma, fall. EXAM: CT HEAD WITHOUT CONTRAST CT CERVICAL SPINE WITHOUT CONTRAST TECHNIQUE: Multidetector CT imaging of the head and cervical spine was performed following the [name] protocol without intravenous contrast. Multiplanar CT image reconstructions of the cervical spine were also generated. RADIATION DOSE REDUCTION: This exam was performed according to the departmental dose-optimization program which includes automated exposure control, adjustment of the mA and/or kV according to patient size and/or use of iterative reconstruction technique. COMPARISON:  09/09/2020, 09/08/2020, 07/02/2020, 07/03/2020. FINDINGS: CT HEAD FINDINGS Brain: No acute intracranial hemorrhage, midline shift or mass effect. No extra-axial fluid collection. Diffuse atrophy is noted. Periventricular white matter hypodensities are present  bilaterally. No hydrocephalus. Vascular: No hyperdense vessel or unexpected calcification. Skull: Normal. Negative for fracture or focal lesion. Sinuses/Orbits: Mild mucosal thickening is present in the ethmoid air cells. No acute orbital abnormality. Other: None. CT CERVICAL SPINE FINDINGS Alignment: Normal. Skull base and vertebrae: No acute fracture. No primary bone lesion or focal pathologic process. Soft tissues and spinal canal: No prevertebral fluid or swelling. No visible canal hematoma. Disc levels: Degenerative endplate osteophyte formation at multiple levels. Upper chest: No acute abnormality. Other: None. IMPRESSION: 1. No acute intracranial process. 2. Atrophy with chronic microvascular ischemic changes. 3. Mild degenerative changes in the cervical spine without evidence of acute fracture. Electronically Signed   By: Thornell Sartorius M.D.   On: 09/02/2021 03:29   DG Knee Complete 4 Views Right  Result Date: 09/02/2021 CLINICAL DATA:  Recent fall with right knee pain, initial encounter EXAM: RIGHT KNEE - COMPLETE 4+ VIEW COMPARISON:  None Available. FINDINGS: No acute fracture or dislocation is noted. Mild soft tissue swelling is noted. IMPRESSION: No acute bony abnormality noted Electronically Signed   By: Alcide Clever M.D.   On: 09/02/2021 03:22   DG Ribs Unilateral W/Chest Left  Result Date: 09/02/2021 CLINICAL DATA:  Recent fall with left-sided rib pain anteriorly EXAM: LEFT RIBS AND CHEST - 3+ VIEW COMPARISON:  09/08/2020 FINDINGS: Cardiac shadow is enlarged but stable. Lungs are hypoinflated but clear. Small granuloma is noted. No acute bony abnormality is seen. IMPRESSION: No rib fractures are seen. Electronically Signed   By: Alcide Clever M.D.   On: 09/02/2021 03:20    Procedures Procedures    Medications Ordered in ED Medications  ketorolac (TORADOL) 30 MG/ML injection 30 mg (has no administration in time range)  acetaminophen (TYLENOL) tablet 650 mg (has no administration in time  range)  potassium chloride (KLOR-CON) packet 40 mEq (has no administration in time range)    ED Course/ Medical Decision Making/ A&P                           Medical Decision Making Risk OTC drugs. Prescription drug management.   62:72 PM 51 year old Spanish-speaking male presenting for knee and rib pain.  Is alert and oriented x3, no acute distress, afebrile, stable vital signs.  Physical exam demonstrates no neurovascular deficits.  No midline spinal tenderness.  Abrasion and tenderness to the right knee.  X-ray demonstrates  no fractures.  Soft tissue swelling only.  Patient given Toradol and Tylenol for pain.  Patient also has left-sided rib pains without any gross deformities or ecchymosis.  Dedicated rib series demonstrates no acute process.  CT head stable.  Patient's labs are stable without signs of alcoholic ketoacidosis.  Patient does have hypokalemia.  Oral potassium replaced in ED.  Recommended to decrease alcohol consumption at this time due to risk of liver failure and cancer with alcohol abuse.  Recommended to follow closely with his primary care physician for guidance on decreasing consumption due to likely withdrawals.  Patient in no distress and overall condition improved here in the ED. Detailed discussions were had with the patient regarding current findings, and need for close f/u with PCP or on call doctor. The patient has been instructed to return immediately if the symptoms worsen in any way for re-evaluation. Patient verbalized understanding and is in agreement with current care plan. All questions answered prior to discharge.         Final Clinical Impression(s) / ED Diagnoses Final diagnoses:  Alcoholic intoxication without complication (HCC)  Acute pain of right knee  Rib pain    Rx / DC Orders ED Discharge Orders          Ordered    ibuprofen (ADVIL) 600 MG tablet  Every 6 hours PRN        09/02/21 1223              Franne Forts,  DO 09/02/21 1224

## 2021-09-02 NOTE — ED Notes (Signed)
Got patient into a gown on the monitor got patient some warm blankets patient is resting with call bell in reach  

## 2021-09-02 NOTE — Discharge Instructions (Addendum)
Please follow-up with the wellness center provided or your primary care doctor for help with cutting back or decreasing alcohol consumption

## 2021-09-02 NOTE — ED Triage Notes (Signed)
Pt was brought in by roommate, pt endorses ETOH tonight with a subsequent fall. Pt c/o bilateral knee pain and left rib cage pain. Denies SOB. Denies LOC.

## 2021-09-29 ENCOUNTER — Other Ambulatory Visit: Payer: Self-pay

## 2021-09-29 ENCOUNTER — Emergency Department (HOSPITAL_COMMUNITY): Payer: Self-pay

## 2021-09-29 ENCOUNTER — Inpatient Hospital Stay (HOSPITAL_COMMUNITY)
Admission: EM | Admit: 2021-09-29 | Discharge: 2021-10-21 | DRG: 640 | Disposition: A | Discharge status: Home / Self Care | Payer: Self-pay | Attending: Internal Medicine | Admitting: Internal Medicine

## 2021-09-29 ENCOUNTER — Inpatient Hospital Stay (HOSPITAL_BASED_OUTPATIENT_CLINIC_OR_DEPARTMENT_OTHER): Payer: Self-pay

## 2021-09-29 ENCOUNTER — Encounter (HOSPITAL_COMMUNITY): Payer: Self-pay | Admitting: Emergency Medicine

## 2021-09-29 DIAGNOSIS — E039 Hypothyroidism, unspecified: Secondary | ICD-10-CM | POA: Diagnosis present

## 2021-09-29 DIAGNOSIS — G312 Degeneration of nervous system due to alcohol: Secondary | ICD-10-CM | POA: Diagnosis present

## 2021-09-29 DIAGNOSIS — G9341 Metabolic encephalopathy: Secondary | ICD-10-CM | POA: Diagnosis present

## 2021-09-29 DIAGNOSIS — R296 Repeated falls: Secondary | ICD-10-CM | POA: Diagnosis present

## 2021-09-29 DIAGNOSIS — G9340 Encephalopathy, unspecified: Secondary | ICD-10-CM | POA: Diagnosis present

## 2021-09-29 DIAGNOSIS — E538 Deficiency of other specified B group vitamins: Secondary | ICD-10-CM | POA: Diagnosis present

## 2021-09-29 DIAGNOSIS — E876 Hypokalemia: Secondary | ICD-10-CM | POA: Diagnosis present

## 2021-09-29 DIAGNOSIS — D6959 Other secondary thrombocytopenia: Secondary | ICD-10-CM | POA: Diagnosis present

## 2021-09-29 DIAGNOSIS — Z79899 Other long term (current) drug therapy: Secondary | ICD-10-CM

## 2021-09-29 DIAGNOSIS — F10239 Alcohol dependence with withdrawal, unspecified: Secondary | ICD-10-CM | POA: Diagnosis present

## 2021-09-29 DIAGNOSIS — D649 Anemia, unspecified: Secondary | ICD-10-CM | POA: Diagnosis present

## 2021-09-29 DIAGNOSIS — Z5900 Homelessness unspecified: Secondary | ICD-10-CM

## 2021-09-29 DIAGNOSIS — E512 Wernicke's encephalopathy: Principal | ICD-10-CM | POA: Diagnosis present

## 2021-09-29 DIAGNOSIS — R531 Weakness: Principal | ICD-10-CM

## 2021-09-29 DIAGNOSIS — R609 Edema, unspecified: Secondary | ICD-10-CM

## 2021-09-29 DIAGNOSIS — E785 Hyperlipidemia, unspecified: Secondary | ICD-10-CM | POA: Diagnosis present

## 2021-09-29 DIAGNOSIS — E0781 Sick-euthyroid syndrome: Secondary | ICD-10-CM | POA: Diagnosis present

## 2021-09-29 DIAGNOSIS — Y907 Blood alcohol level of 200-239 mg/100 ml: Secondary | ICD-10-CM | POA: Diagnosis present

## 2021-09-29 DIAGNOSIS — F10229 Alcohol dependence with intoxication, unspecified: Secondary | ICD-10-CM | POA: Diagnosis present

## 2021-09-29 LAB — BASIC METABOLIC PANEL
Anion gap: 12 (ref 5–15)
BUN: <5 mg/dL — ABNORMAL LOW (ref 6–20)
CO2: 24 mmol/L (ref 22–32)
Calcium: 8.6 mg/dL — ABNORMAL LOW (ref 8.9–10.3)
Chloride: 99 mmol/L (ref 98–111)
Creatinine, Ser: 0.52 mg/dL — ABNORMAL LOW (ref 0.61–1.24)
GFR, Estimated: >60 mL/min (ref >60)
Glucose, Bld: 104 mg/dL — ABNORMAL HIGH (ref 70–99)
Potassium: 3.2 mmol/L — ABNORMAL LOW (ref 3.5–5.1)
Sodium: 135 mmol/L (ref 135–145)

## 2021-09-29 LAB — CBC WITH DIFFERENTIAL/PLATELET
Abs Immature Granulocytes: 0.07 10*3/uL (ref 0.00–0.07)
Basophils Absolute: 0.1 10*3/uL (ref 0.0–0.1)
Basophils Relative: 1 %
Eosinophils Absolute: 0.2 10*3/uL (ref 0.0–0.5)
Eosinophils Relative: 2 %
HCT: 29 % — ABNORMAL LOW (ref 39.0–52.0)
Hemoglobin: 10.3 g/dL — ABNORMAL LOW (ref 13.0–17.0)
Immature Granulocytes: 1 %
Lymphocytes Relative: 12 %
Lymphs Abs: 1.4 10*3/uL (ref 0.7–4.0)
MCH: 34.9 pg — ABNORMAL HIGH (ref 26.0–34.0)
MCHC: 35.5 g/dL (ref 30.0–36.0)
MCV: 98.3 fL (ref 80.0–100.0)
Monocytes Absolute: 1.2 10*3/uL — ABNORMAL HIGH (ref 0.1–1.0)
Monocytes Relative: 11 %
Neutro Abs: 8.7 10*3/uL — ABNORMAL HIGH (ref 1.7–7.7)
Neutrophils Relative %: 73 %
Platelets: 141 10*3/uL — ABNORMAL LOW (ref 150–400)
RBC: 2.95 MIL/uL — ABNORMAL LOW (ref 4.22–5.81)
RDW: 16.3 % — ABNORMAL HIGH (ref 11.5–15.5)
WBC: 11.7 10*3/uL — ABNORMAL HIGH (ref 4.0–10.5)
nRBC: 0 % (ref 0.0–0.2)

## 2021-09-29 LAB — RAPID URINE DRUG SCREEN, HOSP PERFORMED
Amphetamines: NOT DETECTED
Barbiturates: NOT DETECTED
Benzodiazepines: NOT DETECTED
Cocaine: NOT DETECTED
Opiates: NOT DETECTED
Tetrahydrocannabinol: NOT DETECTED

## 2021-09-29 LAB — SALICYLATE LEVEL: Salicylate Lvl: <7 mg/dL — ABNORMAL LOW (ref 7.0–30.0)

## 2021-09-29 LAB — MAGNESIUM: Magnesium: 1.4 mg/dL — ABNORMAL LOW (ref 1.7–2.4)

## 2021-09-29 LAB — LACTIC ACID, PLASMA
Lactic Acid, Venous: 2.3 mmol/L (ref 0.5–1.9)
Lactic Acid, Venous: 2.7 mmol/L (ref 0.5–1.9)

## 2021-09-29 LAB — AMMONIA: Ammonia: 53 umol/L — ABNORMAL HIGH (ref 9–35)

## 2021-09-29 LAB — PHOSPHORUS: Phosphorus: 2.8 mg/dL (ref 2.5–4.6)

## 2021-09-29 LAB — URINALYSIS, ROUTINE W REFLEX MICROSCOPIC
Bilirubin Urine: NEGATIVE
Glucose, UA: NEGATIVE mg/dL
Hgb urine dipstick: NEGATIVE
Ketones, ur: NEGATIVE mg/dL
Leukocytes,Ua: NEGATIVE
Nitrite: NEGATIVE
Protein, ur: NEGATIVE mg/dL
Specific Gravity, Urine: 1.004 — ABNORMAL LOW (ref 1.005–1.030)
pH: 7 (ref 5.0–8.0)

## 2021-09-29 LAB — HEPATIC FUNCTION PANEL
ALT: 24 U/L (ref 0–44)
AST: 80 U/L — ABNORMAL HIGH (ref 15–41)
Albumin: 3.2 g/dL — ABNORMAL LOW (ref 3.5–5.0)
Alkaline Phosphatase: 181 U/L — ABNORMAL HIGH (ref 38–126)
Bilirubin, Direct: 0.2 mg/dL (ref 0.0–0.2)
Indirect Bilirubin: 0.4 mg/dL (ref 0.3–0.9)
Total Bilirubin: 0.6 mg/dL (ref 0.3–1.2)
Total Protein: 6.4 g/dL — ABNORMAL LOW (ref 6.5–8.1)

## 2021-09-29 LAB — IRON AND TIBC
Iron: 89 ug/dL (ref 45–182)
Saturation Ratios: 41 % — ABNORMAL HIGH (ref 17.9–39.5)
TIBC: 218 ug/dL — ABNORMAL LOW (ref 250–450)
UIBC: 129 ug/dL

## 2021-09-29 LAB — PROTIME-INR
INR: 1.3 — ABNORMAL HIGH (ref 0.8–1.2)
Prothrombin Time: 15.8 seconds — ABNORMAL HIGH (ref 11.4–15.2)

## 2021-09-29 LAB — LIPASE, BLOOD: Lipase: 54 U/L — ABNORMAL HIGH (ref 11–51)

## 2021-09-29 LAB — ETHANOL: Alcohol, Ethyl (B): 216 mg/dL — ABNORMAL HIGH (ref <10)

## 2021-09-29 LAB — FERRITIN: Ferritin: 214 ng/mL (ref 24–336)

## 2021-09-29 LAB — BRAIN NATRIURETIC PEPTIDE: B Natriuretic Peptide: 24.9 pg/mL (ref 0.0–100.0)

## 2021-09-29 LAB — VITAMIN B12: Vitamin B-12: 584 pg/mL (ref 180–914)

## 2021-09-29 LAB — ACETAMINOPHEN LEVEL: Acetaminophen (Tylenol), Serum: <10 ug/mL — ABNORMAL LOW (ref 10–30)

## 2021-09-29 MED ORDER — THIAMINE HCL 100 MG/ML IJ SOLN: 100.0000 mg | Freq: Every day | INTRAMUSCULAR | Status: DC

## 2021-09-29 MED ORDER — LORAZEPAM 1 MG PO TABS
1.0000 mg | ORAL_TABLET | ORAL | Status: DC | PRN
  Administered 2021-10-01: 1 mg via ORAL
  Filled 2021-09-29: qty 1

## 2021-09-29 MED ORDER — ACETAMINOPHEN 650 MG RE SUPP: 650.0000 mg | Freq: Four times a day (QID) | RECTAL | Status: DC | PRN

## 2021-09-29 MED ORDER — LORAZEPAM 1 MG PO TABS
1.0000 mg | ORAL_TABLET | Freq: Two times a day (BID) | ORAL | Status: DC
  Administered 2021-09-29: 1 mg via ORAL
  Filled 2021-09-29: qty 1

## 2021-09-29 MED ORDER — ONDANSETRON HCL 4 MG/2ML IJ SOLN: 4.0000 mg | Freq: Four times a day (QID) | INTRAMUSCULAR | Status: DC | PRN

## 2021-09-29 MED ORDER — CYANOCOBALAMIN 1000 MCG/ML IJ SOLN
1000.0000 ug | Freq: Once | INTRAMUSCULAR | Status: AC
  Administered 2021-09-29: 1000 ug via INTRAMUSCULAR
  Filled 2021-09-29: qty 1

## 2021-09-29 MED ORDER — LORAZEPAM 2 MG/ML IJ SOLN
1.0000 mg | INTRAMUSCULAR | Status: DC | PRN
  Administered 2021-09-29: 1 mg via INTRAVENOUS
  Administered 2021-09-30 – 2021-10-01 (×6): 2 mg via INTRAVENOUS
  Filled 2021-09-29 (×7): qty 1

## 2021-09-29 MED ORDER — THIAMINE MONONITRATE 100 MG PO TABS: 100.0000 mg | ORAL_TABLET | Freq: Every day | ORAL | Status: DC

## 2021-09-29 MED ORDER — ONDANSETRON HCL 4 MG PO TABS: 4.0000 mg | ORAL_TABLET | Freq: Four times a day (QID) | ORAL | Status: DC | PRN

## 2021-09-29 MED ORDER — LORAZEPAM 1 MG PO TABS
2.0000 mg | ORAL_TABLET | Freq: Two times a day (BID) | ORAL | Status: AC
  Administered 2021-09-29 – 2021-09-30 (×3): 2 mg via ORAL
  Filled 2021-09-29 (×3): qty 2

## 2021-09-29 MED ORDER — ADULT MULTIVITAMIN W/MINERALS CH
1.0000 | ORAL_TABLET | Freq: Every day | ORAL | Status: DC
  Administered 2021-09-29 – 2021-10-21 (×23): 1 via ORAL
  Filled 2021-09-29 (×23): qty 1

## 2021-09-29 MED ORDER — ACETAMINOPHEN 325 MG PO TABS
650.0000 mg | ORAL_TABLET | Freq: Four times a day (QID) | ORAL | Status: DC | PRN
  Administered 2021-10-16: 650 mg via ORAL
  Filled 2021-09-29: qty 2

## 2021-09-29 MED ORDER — IOHEXOL 350 MG/ML SOLN
100.0000 mL | Freq: Once | INTRAVENOUS | Status: AC | PRN
  Administered 2021-09-29: 75 mL via INTRAVENOUS

## 2021-09-29 MED ORDER — FOLIC ACID 1 MG PO TABS
1.0000 mg | ORAL_TABLET | Freq: Every day | ORAL | Status: DC
  Administered 2021-09-29 – 2021-10-21 (×23): 1 mg via ORAL
  Filled 2021-09-29 (×23): qty 1

## 2021-09-29 MED ORDER — THIAMINE HCL 100 MG/ML IJ SOLN
100.0000 mg | Freq: Every day | INTRAMUSCULAR | Status: DC
  Administered 2021-09-29: 100 mg via INTRAVENOUS
  Filled 2021-09-29: qty 2

## 2021-09-29 MED ORDER — ENOXAPARIN SODIUM 40 MG/0.4ML IJ SOSY
40.0000 mg | PREFILLED_SYRINGE | INTRAMUSCULAR | Status: DC
  Administered 2021-09-29 – 2021-10-21 (×23): 40 mg via SUBCUTANEOUS
  Filled 2021-09-29 (×23): qty 0.4

## 2021-09-29 MED ORDER — MAGNESIUM SULFATE 2 GM/50ML IV SOLN
2.0000 g | Freq: Once | INTRAVENOUS | Status: AC
  Administered 2021-09-29: 2 g via INTRAVENOUS
  Filled 2021-09-29: qty 50

## 2021-09-29 MED ORDER — ACETAMINOPHEN 325 MG PO TABS: 650.0000 mg | ORAL_TABLET | Freq: Four times a day (QID) | ORAL | Status: DC | PRN

## 2021-09-29 MED ORDER — THIAMINE HCL 100 MG/ML IJ SOLN
500.0000 mg | Freq: Three times a day (TID) | INTRAVENOUS | Status: DC
  Administered 2021-09-29 – 2021-10-01 (×7): 500 mg via INTRAVENOUS
  Filled 2021-09-29 (×11): qty 5

## 2021-09-29 MED ORDER — POTASSIUM CHLORIDE CRYS ER 20 MEQ PO TBCR
40.0000 meq | EXTENDED_RELEASE_TABLET | ORAL | Status: AC
  Administered 2021-09-29 (×2): 40 meq via ORAL
  Filled 2021-09-29 (×2): qty 2

## 2021-09-29 MED ORDER — SODIUM CHLORIDE 0.9 % IV SOLN: INTRAVENOUS | Status: DC

## 2021-09-29 MED ORDER — SENNOSIDES-DOCUSATE SODIUM 8.6-50 MG PO TABS
1.0000 | ORAL_TABLET | Freq: Every evening | ORAL | Status: DC | PRN
  Administered 2021-10-16: 1 via ORAL
  Filled 2021-09-29: qty 1

## 2021-09-29 NOTE — Progress Notes (Signed)
  Carryover admission to the Day Admitter.  I discussed this case with the EDP, Dr. Blinda Leatherwood.  Per these discussions:   This is a 51 year old male with history of chronic alcohol abuse who is being admitted with generalized weakness and frequent falls.  Patient is Spanish-speaking and is friend with him that helps provide history.  Friend notes that over the last 1 to 2 weeks, the patient is notably more weak and a general sense resulting in increased frequency of falls, estimating that the patient is fallen as many as 20 times over the course of the last week, without any reported loss of consciousness.  Over the last 1 to 2 weeks, friend also notes that the patient has been confused relative to baseline mental status.  Friend is able to complete the patient continues to consume alcohol on a daily basis but emphasizes the patient's generalized weakness and confusion represent a change from baseline, which is new, over the last 1 to 2 weeks.   On physical exam, EDP conveys that there is no acute focal weakness, although patient notes diminished sensation in the bilateral hands and bilateral feet.  Unclear if this is neuropathy in the setting of the patient's chronic alcohol abuse alcohol.  The patient also admits edema in the bilateral lower extremities, which is also reportedly somewhat new to him, although the specific timeframe is not clear.  In the setting of history of falls, the patient has undergone CT head, cervical spine, chest, abdomen, pelvis as well as T and L-spine, without any reported significant acute abnormality.   Of note, his presenting serum ethanol level is approximately 200.  I have placed an order for for observation to med telemetry for further evaluation management of new onset generalized weakness associated with frequent falls as well as acute encephalopathy.   I have placed some additional preliminary admit orders via the adult multi-morbid admission order set. I have also  ordered n.p.o. status, pending further evaluation by admitting hospitalist.  Orders for CIWA protocol and transition of care consult have already been initiated.  Have added on serum magnesium and phosphorus levels.  He is receiving a dose of IV thiamine now.    Newton Pigg, DO Hospitalist

## 2021-09-29 NOTE — Progress Notes (Signed)
Lower extremity venous bilateral study completed.   Please see CV Proc for preliminary results.   Thurlow Gallaga, RDMS, RVT  
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
## 2021-09-29 NOTE — ED Notes (Signed)
Pt back from CT

## 2021-09-29 NOTE — H&P (Signed)
History and Physical    Samuel [address] Betsy Coder UXL:[id number] DOB: 11/08/1970 DOA: 09/29/2021  PCP: Patient, No Pcp Per (Confirm with patient/family/NH records and if not entered, this has to be entered at [facility] point of entry) Patient coming from: Homeless  I have personally briefly reviewed patient's old medical records in [facility]  Chief Complaint: Arms and legs feeling numb, frequent falls  HPI: Samuel [name] is a 51 y.o. male with medical history significant of alcohol abuse, homeless, presented with worsening of numbness and weakness of arms and legs, frequent falls and leg swelling.  Patient reported feeling generalized weakness with frequent falls in the last 4 days, also complaining about numbness of bilateral forearms and hand and 10 fingers as well numbness of bilateral lower extremities below the knee, he sustained several falls during the..  Denies any LOC.  He complains about left-sided rib cage pain, no shortness of breath a friend brought him in to the ED this morning. He does " drink a lot of beer every day", and appears that he has had frequent ED visit for alcohol intoxication.  Today's alcohol level> 200.  Trauma scan negative for acute fracture or dislocation.    Review of Systems: Unable to perform, patient intoxicated   Past Medical History:  Diagnosis Date   Alcohol use    ETOH abuse     History reviewed. No pertinent surgical history.   reports that he has never smoked. He has never used smokeless tobacco. He reports current alcohol use of about 72.0 [name] drinks of alcohol per week. He reports that he does not use drugs.  No Known Allergies  History reviewed. No pertinent family history.   Prior to Admission medications   Medication Sig Start Date End Date Taking? Authorizing Provider  acetaminophen (TYLENOL) 325 MG tablet Take 2 tablets (650 mg total) by mouth every 6 (six) hours as needed. Patient not taking:  Reported on 09/08/2020 07/21/20   Barnetta Chapel, PA-C  ibuprofen (ADVIL) 600 MG tablet Take 1 tablet (600 mg total) by mouth every 6 (six) hours as needed. Patient not taking: Reported on 09/29/2021 09/02/21   Franne Forts, DO    Physical Exam: Vitals:   09/29/21 0645 09/29/21 0700 09/29/21 0800 09/29/21 0815  BP: (!) 127/90 (!) 121/90 (!) 136/106 (!) 129/97  Pulse: (!) 114 (!) 115 (!) 113 98  Resp:   15   Temp:      TempSrc:      SpO2: 99% 98% 98% 97%  Weight:      Height:        Constitutional: NAD, calm, comfortable Vitals:   09/29/21 0645 09/29/21 0700 09/29/21 0800 09/29/21 0815  BP: (!) 127/90 (!) 121/90 (!) 136/106 (!) 129/97  Pulse: (!) 114 (!) 115 (!) 113 98  Resp:   15   Temp:      TempSrc:      SpO2: 99% 98% 98% 97%  Weight:      Height:       Eyes: PERRL, lids and conjunctivae normal ENMT: Mucous membranes are moist. Posterior pharynx clear of any exudate or lesions.Normal dentition.  Neck: normal, supple, no masses, no thyromegaly Respiratory: clear to auscultation bilaterally, no wheezing, no crackles. Normal respiratory effort. No accessory muscle use.  Cardiovascular: Regular rate and rhythm, no murmurs / rubs / gallops. No extremity edema. 2+ pedal pulses. No carotid bruits.  Abdomen: no tenderness, no masses palpated. No hepatosplenomegaly. Bowel sounds  positive.  Musculoskeletal: no clubbing / cyanosis. No joint deformity upper and lower extremities. Good ROM, no contractures. Normal muscle tone.  Skin: no rashes, lesions, ulcers. No induration Neurologic: No facial droops, moving all limbs, following some commands, very sluggish ataxia test, heel to knee bilaterally.  Fine tremors on bilateral fingertips Psychiatric: Oriented to himself and place, confused about time    Labs on Admission: I have personally reviewed following labs and imaging studies  CBC: Recent Labs  Lab 09/29/21 0258  WBC 11.7*  NEUTROABS 8.7*  HGB 10.3*  HCT 29.0*  MCV 98.3   PLT 141*   Basic Metabolic Panel: Recent Labs  Lab 09/29/21 0258  NA 135  K 3.2*  CL 99  CO2 24  GLUCOSE 104*  BUN <5*  CREATININE 0.52*  CALCIUM 8.6*  MG 1.4*  PHOS 2.8   GFR: Estimated Creatinine Clearance: 91.8 mL/min (A) (by C-G formula based on SCr of 0.52 mg/dL (L)). Liver Function Tests: Recent Labs  Lab 09/29/21 0258  AST 80*  ALT 24  ALKPHOS 181*  BILITOT 0.6  PROT 6.4*  ALBUMIN 3.2*   Recent Labs  Lab 09/29/21 0258  LIPASE 54*   Recent Labs  Lab 09/29/21 0258  AMMONIA 53*   Coagulation Profile: No results for input(s): "INR", "PROTIME" in the last 168 hours. Cardiac Enzymes: No results for input(s): "CKTOTAL", "CKMB", "CKMBINDEX", "TROPONINI" in the last 168 hours. BNP (last 3 results) No results for input(s): "PROBNP" in the last 8760 hours. HbA1C: No results for input(s): "HGBA1C" in the last 72 hours. CBG: No results for input(s): "GLUCAP" in the last 168 hours. Lipid Profile: No results for input(s): "CHOL", "HDL", "LDLCALC", "TRIG", "CHOLHDL", "LDLDIRECT" in the last 72 hours. Thyroid Function Tests: No results for input(s): "TSH", "T4TOTAL", "FREET4", "T3FREE", "THYROIDAB" in the last 72 hours. Anemia Panel: No results for input(s): "VITAMINB12", "FOLATE", "FERRITIN", "TIBC", "IRON", "RETICCTPCT" in the last 72 hours. Urine analysis:    Component Value Date/Time   COLORURINE YELLOW 09/29/2021 0258   APPEARANCEUR CLEAR 09/29/2021 0258   LABSPEC 1.004 (L) 09/29/2021 0258   PHURINE 7.0 09/29/2021 0258   GLUCOSEU NEGATIVE 09/29/2021 0258   HGBUR NEGATIVE 09/29/2021 0258   BILIRUBINUR NEGATIVE 09/29/2021 0258   KETONESUR NEGATIVE 09/29/2021 0258   PROTEINUR NEGATIVE 09/29/2021 0258   NITRITE NEGATIVE 09/29/2021 0258   LEUKOCYTESUR NEGATIVE 09/29/2021 0258    Radiological Exams on Admission: CT L-SPINE NO CHARGE  Result Date: 09/29/2021 CLINICAL DATA:  51 year old homeless male with a history of fall. Extremity swelling, pain,  numbness. History of T7 and T10 fractures in 2022. EXAM: CT LUMBAR SPINE WITH CONTRAST TECHNIQUE: Technique: Multiplanar CT images of the lumbar spine were reconstructed from contemporary CT of the Abdomen and Pelvis. RADIATION DOSE REDUCTION: This exam was performed according to the departmental dose-optimization program which includes automated exposure control, adjustment of the mA and/or kV according to patient size and/or use of iterative reconstruction technique. CONTRAST:  No additional COMPARISON:  CT thoracic spine, CT Chest, Abdomen, and Pelvis today reported separately. CT Abdomen and Pelvis 06/17/2018. FINDINGS: Segmentation: Normal, concordant with the thoracic spine numbering today. Alignment: Stable to improved lumbar lordosis compared to the 2020 CT. No spondylolisthesis or scoliosis. Vertebrae: Lumbar vertebrae appear stable and intact. Bone mineralization is within normal limits. Intact visible sacrum and SI joints. Paraspinal and other soft tissues: Abdominal and pelvic viscera are detailed separately. Lumbar paraspinal soft tissues are within normal limits. Disc levels: Mild lumbar disc bulging does not appear significantly changed  since 2020. Some superimposed lumbar epidural lipomatosis (L5-S1). Trace vacuum disc at L2-L3. But otherwise no CT evidence of lumbar spinal stenosis. IMPRESSION: 1. No acute osseous abnormality in the lumbar spine. 2. Mild lumbar spine degeneration appears stable since 2020 with no CT evidence of spinal stenosis. 3. CT Chest, Abdomen, and Pelvis today are reported separately. Electronically Signed   By: Odessa Fleming M.D.   On: 09/29/2021 05:47   CT T-SPINE NO CHARGE  Result Date: 09/29/2021 CLINICAL DATA:  51 year old homeless male with a history of fall. Extremity swelling, pain, numbness. History of T7 and T10 fractures in 2022. EXAM: CT THORACIC SPINE WITHOUT CONTRAST TECHNIQUE: Multidetector CT images of the thoracic were obtained using the [name] protocol  without intravenous contrast. RADIATION DOSE REDUCTION: This exam was performed according to the departmental dose-optimization program which includes automated exposure control, adjustment of the mA and/or kV according to patient size and/or use of iterative reconstruction technique. COMPARISON:  CT cervical spine, chest, Abdomen, and Pelvis today are reported separately. Thoracic spine CT 07/02/2020.  Thoracic spine MRI 07/03/2020. FINDINGS: Limited cervical spine imaging: Cervicothoracic junction alignment is within normal limits. Thoracic spine segmentation: Normal. This is the same numbering system used last year. Alignment: Some posttraumatic exaggeration of thoracic kyphosis compared to 2022. No significant spondylolisthesis. Mild dextroconvex thoracic scoliosis is chronic and stable. Vertebrae: Chronic T7 and T10 vertebral body fractures demonstrate some additional loss of height since last year. T10 vertebral chance type fracture previously with interval healing of the horizontal posterior element fracture plane. Focal kyphosis at T10 with maintained posterior element alignment. T1 through T6 appears stable and intact. T8 and T9 appears stable and intact. T11 and T12 appears stable and intact. Chronic bilateral 1st rib, And right 2nd and 3rd rib costovertebral junction fractures. No acute osseous abnormality identified. Paraspinal and other soft tissues: Chest and abdominal viscera are detailed separately today. Thoracic paraspinal soft tissues are within normal limits. Disc levels: Some thoracic disc and endplate degeneration associated with the T7 and T10 vertebral trauma. But no CT evidence of thoracic spinal stenosis. IMPRESSION: 1. Sequelae of 2022 T7 compression and T10 Chance Fractures. Interval healing with some loss of height at both levels. Subsequent focal kyphosis at T10. 2. No associated thoracic spinal stenosis by CT. And no acute osseous abnormality in the thoracic spine. 3.  CT Chest,  Abdomen, and Pelvis today are reported separately. Electronically Signed   By: Odessa Fleming M.D.   On: 09/29/2021 05:43   CT CHEST ABDOMEN PELVIS W CONTRAST  Result Date: 09/29/2021 CLINICAL DATA:  51 year old homeless male with a history of fall. Extremity swelling, pain, numbness. History of T7 and T10 fractures in 2022. EXAM: CT CHEST, ABDOMEN, AND PELVIS WITH CONTRAST TECHNIQUE: Multidetector CT imaging of the chest, abdomen and pelvis was performed following the [name] protocol during bolus administration of intravenous contrast. RADIATION DOSE REDUCTION: This exam was performed according to the departmental dose-optimization program which includes automated exposure control, adjustment of the mA and/or kV according to patient size and/or use of iterative reconstruction technique. CONTRAST:  47mL OMNIPAQUE IOHEXOL 350 MG/ML SOLN COMPARISON:  CT thoracic and lumbar spine today are detailed separately. CT Chest, Abdomen, and Pelvis 07/02/2020. FINDINGS: CT CHEST FINDINGS Cardiovascular: Borderline to mild cardiomegaly does not appear significantly changed from last year. No pericardial effusion. Negative thoracic aorta. No calcified coronary artery atherosclerosis is evident. The central pulmonary arteries are enhancing and grossly patent. Mediastinum/Nodes: No mediastinal mass or lymphadenopathy. There are chronic densely calcified small  left hilar and AP window lymph nodes which are postinflammatory, post granulomatous and stable. Lungs/Pleura: Major airways are patent. Low lung volumes similar to the chest CT last year. Curvilinear scarring along the right minor fissure appears increased. Superimposed areas of patchy and dependent atelectasis as well as gas trapping resulting in mosaic attenuation. No consolidation. No pleural effusion. No lung mass identified. Musculoskeletal: Thoracic spine is detailed separately. Chronic right clavicle, scapula, and bilateral rib fractures. Intact sternum. CT ABDOMEN  PELVIS FINDINGS Hepatobiliary: Pronounced gallbladder layering sludge or nonshadowing gravel type stones on series 4, image 54. Mild gallbladder wall thickening, and possible early pericholecystic inflammation on series 7, image 40. Enhancement of the adjacent liver is within normal limits and hepatic steatosis has regressed from last year. No bile duct dilatation. Pancreas: Pancreas is within normal limits. Spleen: Negative. Adrenals/Urinary Tract: Normal adrenal glands. Nonobstructed kidneys with symmetric renal enhancement. No nephrolithiasis or pararenal inflammation. No renal mass. Urinary bladder is distended but otherwise unremarkable. No hydroureter. Stomach/Bowel: Redundant large bowel with retained low-density stool throughout. Normal appendix tracks toward the midline on coronal image 57. No large bowel inflammation identified. Negative terminal ileum. Fluid-filled but nondilated small bowel in the abdomen and pelvis. Stomach and duodenum are decompressed. No free air or free fluid. Vascular/Lymphatic: Major arterial structures in the abdomen and pelvis are patent with no significant atherosclerosis. Portal venous system is patent. No lymphadenopathy identified. Reproductive: Negative. Other: No pelvic free fluid. Musculoskeletal: Lumbar spine is detailed separately. Sacrum, SI joints, pelvis, and proximal right femur appear intact. Chronic deformity of the proximal left femur is stable. IMPRESSION: CHEST: 1. Chronic posttraumatic and mild post granulomatous changes in the chest. 2. Low lung volumes with pulmonary scarring and atelectasis. 3. Thoracic spine is detailed separately. ABDOMEN AND PELVIS: 1. Gallbladder layering sludge with mild gallbladder wall thickening and questionable early gallbladder inflammation. Chronic cholecystitis is possible but cannot exclude Acute Cholecystitis. Right Upper Quadrant Ultrasound recommended if there is right upper quadrant pain. 2. Distended gallbladder, query  urinary retention. No associated hydronephrosis or hydroureter. 3. No other acute or inflammatory process identified in the abdomen or pelvis. 4. Lumbar spine is detailed separately. Electronically Signed   By: Odessa Fleming M.D.   On: 09/29/2021 05:37   CT CERVICAL SPINE WO CONTRAST  Result Date: 09/29/2021 CLINICAL DATA:  51 year old homeless male with a history of fall. Extremity swelling, pain, numbness. History of T7 and T10 fractures in 2022. EXAM: CT CERVICAL SPINE WITHOUT CONTRAST TECHNIQUE: Multidetector CT imaging of the cervical spine was performed without intravenous contrast. Multiplanar CT image reconstructions were also generated. RADIATION DOSE REDUCTION: This exam was performed according to the departmental dose-optimization program which includes automated exposure control, adjustment of the mA and/or kV according to patient size and/or use of iterative reconstruction technique. COMPARISON:  Cervical spine CT 09/02/2021. Cervical spine MRI 07/03/2020. FINDINGS: Alignment: Chronic straightening of cervical lordosis. Cervicothoracic junction alignment is within normal limits. Bilateral posterior element alignment is within normal limits. Skull base and vertebrae: Visualized skull base is intact. No atlanto-occipital dissociation. C1 and C2 appear intact and aligned. No acute osseous abnormality identified. Soft tissues and spinal canal: No prevertebral fluid or swelling. No visible canal hematoma. Some calcified carotid atherosclerosis. Otherwise negative visible noncontrast neck soft tissues. Disc levels: Multilevel cervical spine disc bulging with endplate spurring. This appears not significantly changed from June 2022 when MRI demonstrated no significant spinal stenosis. Upper chest: Chronic left 1st rib fracture. Chronic right clavicle fracture. Chronic posterior right 2nd and  3rd rib costovertebral junction fractures. Chest CT today is reported separately. IMPRESSION: 1. No acute traumatic injury  identified in the cervical spine. 2. Stable cervical spine degeneration, with no associated cervical spinal stenosis by MRI last year. 3. Chronic upper rib and clavicle fractures. Chest CT today is reported separately. Electronically Signed   By: Odessa Fleming M.D.   On: 09/29/2021 05:27   CT HEAD WO CONTRAST ( )  Result Date: 09/29/2021 CLINICAL DATA:  51 year old homeless male with a history of fall. Extremity swelling, pain, numbness. History of T7 and T10 fractures in 2022. EXAM: CT HEAD WITHOUT CONTRAST TECHNIQUE: Contiguous axial images were obtained from the base of the skull through the vertex without intravenous contrast. RADIATION DOSE REDUCTION: This exam was performed according to the departmental dose-optimization program which includes automated exposure control, adjustment of the mA and/or kV according to patient size and/or use of iterative reconstruction technique. COMPARISON:  Head CT 09/02/2021 and earlier.  Brain MRI 09/09/2020. FINDINGS: Brain: Chronic cerebral volume loss for age appears stable. No midline shift, ventriculomegaly, mass effect, evidence of mass lesion, intracranial hemorrhage or evidence of cortically based acute infarction. Gray-white matter differentiation remains within normal limits. Vascular: Faint Calcified atherosclerosis at the skull base. No suspicious intracranial vascular hyperdensity. Skull: Chronic appearing nasal bone fractures appear stable from last year. No acute osseous abnormality identified. Sinuses/Orbits: Scattered mild paranasal sinus mucosal thickening has not significantly changed. Tympanic cavities and mastoids remain clear. Other: Visualized orbits and scalp soft tissues are within normal limits. IMPRESSION: 1. No acute intracranial abnormality. Chronic advanced for age cerebral volume loss. 2. Chronic nasal bone fractures. Electronically Signed   By: Odessa Fleming M.D.   On: 09/29/2021 05:23    EKG: Independently reviewed.  Sinus tachycardia, no acute ST  changes.  Assessment/Plan Principal Problem:   Generalized weakness Active Problems:   Wernicke's encephalopathy   Encephalopathy  (please populate well all problems here in Problem List. (For example, if patient is on BP meds at home and you resume or decide to hold them, it is a problem that needs to be her. Same for CAD, COPD, HLD and so on)  Acute Warnicke's encephalopathy -With symptoms signs of acute ataxia, worsening of confusion.  Treatment, starting high-dose IV thiamine -CIWA protocol with twice daily Ativan for 2 days. -UDS negative  Generalized weakness and suspected peripheral neuropathy -Check B12 level -1 dose of IM B12 injection given  Acute alcohol intoxication with alcohol withdrawal -CIWA protocol with Ativan twice daily x2 days then tapering/as needed dosage  Hypokalemia -Secondary to alcohol abuse and severe hypomagnesemia -P.o. replacement, recheck level tomorrow  Severe hypomagnesemia -Due to alcohol abuse, IV replacement, recheck level tomorrow  Recurrent falls -Trauma scan negative for acute findings, etiology likely secondary to alcohol abuse and encephalopathy.  Peripheral edema -Rule out DVT, BNP low and patient had a normal echo last year, so CHF is of low suspicion.  Kidney function is normal and synthetic liver function appears to be borderline low.  If DVT ruled out, will give Lasix.  Hold off maintenance IV fluid for now.  Chronic normocytic anemia -No symptoms or signs of active bleed, H&H stable, check B12 and iron study.  DVT prophylaxis: Lovenox Code Status: Full code Family Communication: None at bedside Disposition Plan: Patient sick with Warnicke's encephalopathy, expect more than 2 midnight hospital stay Consults called: None Admission status: Telemetry admission   Emeline General MD [facility] Pager [phone] 09/29/2021, 9:51 AM

## 2021-09-29 NOTE — ED Triage Notes (Signed)
Pt here with multiple complaints: pt homeless, brought in by friend, c/o bilateral hand and feet numbness following a fall x 1 month ago, bilateral ankle swelling, and left sided pain, pt and friend concerned for mass.

## 2021-09-29 NOTE — ED Notes (Signed)
Pt's hr in high 140's. Pt scored 3 on CIWAA. Gave pt 1mg  of IV ativan.

## 2021-09-29 NOTE — ED Notes (Signed)
Critical K+ of 2.3 reported to Dr. Blinda Leatherwood

## 2021-09-30 ENCOUNTER — Inpatient Hospital Stay (HOSPITAL_COMMUNITY): Payer: Self-pay

## 2021-09-30 DIAGNOSIS — E512 Wernicke's encephalopathy: Secondary | ICD-10-CM

## 2021-09-30 DIAGNOSIS — E876 Hypokalemia: Secondary | ICD-10-CM

## 2021-09-30 LAB — HEPATIC FUNCTION PANEL
ALT: 21 U/L (ref 0–44)
AST: 79 U/L — ABNORMAL HIGH (ref 15–41)
Albumin: 2.9 g/dL — ABNORMAL LOW (ref 3.5–5.0)
Alkaline Phosphatase: 205 U/L — ABNORMAL HIGH (ref 38–126)
Bilirubin, Direct: 0.4 mg/dL — ABNORMAL HIGH (ref 0.0–0.2)
Indirect Bilirubin: 0.8 mg/dL (ref 0.3–0.9)
Total Bilirubin: 1.2 mg/dL (ref 0.3–1.2)
Total Protein: 6.2 g/dL — ABNORMAL LOW (ref 6.5–8.1)

## 2021-09-30 LAB — HEMOGLOBIN A1C
Hgb A1c MFr Bld: 5.4 % (ref 4.8–5.6)
Mean Plasma Glucose: 108.28 mg/dL

## 2021-09-30 LAB — TSH: TSH: 11.067 u[IU]/mL — ABNORMAL HIGH (ref 0.350–4.500)

## 2021-09-30 LAB — SEDIMENTATION RATE: Sed Rate: 15 mm/hr (ref 0–16)

## 2021-09-30 LAB — POTASSIUM: Potassium: 3.6 mmol/L (ref 3.5–5.1)

## 2021-09-30 LAB — MAGNESIUM: Magnesium: 1.5 mg/dL — ABNORMAL LOW (ref 1.7–2.4)

## 2021-09-30 LAB — HEPATITIS B SURFACE ANTIGEN: Hepatitis B Surface Ag: NONREACTIVE

## 2021-09-30 MED ORDER — MAGNESIUM SULFATE 4 GM/100ML IV SOLN
4.0000 g | Freq: Once | INTRAVENOUS | Status: AC
  Administered 2021-09-30: 4 g via INTRAVENOUS
  Filled 2021-09-30: qty 100

## 2021-09-30 MED ORDER — POTASSIUM CHLORIDE CRYS ER 20 MEQ PO TBCR
20.0000 meq | EXTENDED_RELEASE_TABLET | Freq: Once | ORAL | Status: AC
  Administered 2021-09-30: 20 meq via ORAL
  Filled 2021-09-30: qty 1

## 2021-09-30 NOTE — Evaluation (Signed)
Physical Therapy Evaluation Patient Details Name: Samuel [name] MRN: [id number] DOB: 28-May-1970 Today's Date: 09/30/2021  History of Present Illness  51 yo male presents to [facility] on 9/13 with bilat hand and feet numbness, LE swelling, frequent falls. CTH negative for acute findings. Workup for Wernicke's encephalopathy. Recent admission 8/23 for syncope and withdrawals. PMH: alcohol abuse, fall 1 year ago resulting in subarachnoid hemorrhage and thoracic fractures, homelessness.  Clinical Impression   Pt presents with ataxic gait, impaired balance, impaired cognition, and decreased activity tolerance. Pt to benefit from acute PT to address deficits. Pt ambulated short hallway distance with assist to steady and guide RW, max safety cues needed. Pt endorses having support at d/c and plans to live with his friends, also states he has a RW. Given insurance situation, likely would not get HHPT so recommending OPPT to address balance and strength deficits, will need ride from his friends. PT to progress mobility as tolerated, and will continue to follow acutely.         Recommendations for follow up therapy are one component of a multi-disciplinary discharge planning process, led by the attending physician.  Recommendations may be updated based on patient status, additional functional criteria and insurance authorization.  Follow Up Recommendations Outpatient PT (need to clarify home support, pt states he plans to stay with his freind at d/c and they can phyiscally assist him as needed)      Assistance Recommended at Discharge Frequent or constant Supervision/Assistance  Patient can return home with the following  A little help with walking and/or transfers;A little help with bathing/dressing/bathroom;Direct supervision/assist for medications management;Direct supervision/assist for financial management;Assist for transportation    Equipment Recommendations None recommended by PT   Recommendations for Other Services       Functional Status Assessment Patient has had a recent decline in their functional status and demonstrates the ability to make significant improvements in function in a reasonable and predictable amount of time.     Precautions / Restrictions Precautions Precautions: Fall Restrictions Weight Bearing Restrictions: No      Mobility  Bed Mobility Overal bed mobility: Needs Assistance Bed Mobility: Supine to Sit, Sit to Supine     Supine to sit: Min assist Sit to supine: Min assist   General bed mobility comments: assist for trunk elevation, repositioning once returned to supine.    Transfers Overall transfer level: Needs assistance Equipment used: Rolling walker (2 wheels) Transfers: Sit to/from Stand Sit to Stand: Min assist           General transfer comment: assist to rise and steady, cues for safe hand placement    Ambulation/Gait Ambulation/Gait assistance: Min assist Gait Distance (Feet): 30 Feet Assistive device: Rolling walker (2 wheels) Gait Pattern/deviations: Step-through pattern, Decreased stride length, Wide base of support, Ataxic Gait velocity: decr     General Gait Details: wide based gait with assist to steady and guide RW, max cuing for positioning in RW  Stairs            Wheelchair Mobility    Modified Rankin (Stroke Patients Only)       Balance Overall balance assessment: Needs assistance, History of Falls Sitting-balance support: No upper extremity supported, Feet supported Sitting balance-Leahy Scale: Fair     Standing balance support: Bilateral upper extremity supported, During functional activity, Reliant on assistive device for balance Standing balance-Leahy Scale: Poor  Pertinent Vitals/Pain Pain Assessment Pain Assessment: Faces Faces Pain Scale: Hurts a little bit Pain Location: legs Pain Descriptors / Indicators: Discomfort Pain  Intervention(s): Limited activity within patient's tolerance, Monitored during session, Repositioned    Home Living Family/patient expects to be discharged to:: Private residence Living Arrangements: Non-relatives/Friends (pt states he lives with a male friend but she lives "far away") Available Help at Discharge: Friend(s) Type of Home: House Home Access: Stairs to enter   [employer] CorporationEntrance Stairs-Number of Steps: pt does not directly state, but states "yes" there are stairs   Home Layout: Able to live on main level with bedroom/bathroom Home Equipment: Rolling Walker (2 wheels)      Prior Function Prior Level of Function : History of Falls (last six months);Patient poor historian/Family not available             Mobility Comments: pt reports multiple falls over the past year at least, states he uses a RW but unsure of accuracy ADLs Comments: pt states "yes" he needs help but does not elaborate when asked     Hand Dominance   Dominant Hand: Right    Extremity/Trunk Assessment   Upper Extremity Assessment Upper Extremity Assessment: Defer to OT evaluation    Lower Extremity Assessment Lower Extremity Assessment: Generalized weakness (ataxia, stocking-glove neuropathy per pt report)    Cervical / Trunk Assessment Cervical / Trunk Assessment: Normal  Communication   Communication: Prefers language other than [place];Interpreter utilized Pilar Jarvis(Denisse [phone])  Cognition Arousal/Alertness: Awake/alert Behavior During Therapy: Impulsive, Flat affect Overall Cognitive Status: Impaired/Different from baseline Area of Impairment: Attention, Orientation, Following commands, Safety/judgement, Problem solving                 Orientation Level: Disoriented to, Place, Situation Current Attention Level: Sustained   Following Commands: Follows one step commands inconsistently Safety/Judgement: Decreased awareness of deficits, Decreased awareness of safety   Problem Solving:  Difficulty sequencing, Requires verbal cues, Requires tactile cues General Comments: pt states he is in [place] across from a general store, and cannot state why he is here. requires cues to wait for assist, poor safety awareness and answers some questions inappropraitely.        General Comments General comments (skin integrity, edema, etc.): HRmax observed 116 bpm    Exercises     Assessment/Plan    PT Assessment Patient needs continued PT services  PT Problem List Decreased strength;Decreased mobility;Decreased safety awareness;Decreased activity tolerance;Decreased balance;Decreased knowledge of use of DME;Pain;Impaired sensation;Cardiopulmonary status limiting activity;Decreased cognition       PT Treatment Interventions DME instruction;Therapeutic activities;Gait training;Therapeutic exercise;Patient/family education;Balance training;Stair training;Functional mobility training;Neuromuscular re-education    PT Goals (Current goals can be found in the Care Plan section)  Acute Rehab PT Goals Patient Stated Goal: home with friend PT Goal Formulation: With patient Time For Goal Achievement: 10/14/21 Potential to Achieve Goals: Fair    Frequency Min 3X/week     Co-evaluation               AM-PAC PT "6 Clicks" Mobility  Outcome Measure Help needed turning from your back to your side while in a flat bed without using bedrails?: A Little Help needed moving from lying on your back to sitting on the side of a flat bed without using bedrails?: A Little Help needed moving to and from a bed to a chair (including a wheelchair)?: A Little Help needed standing up from a chair using your arms (e.g., wheelchair or bedside chair)?: A Little Help needed to walk  in hospital room?: A Little Help needed climbing 3-5 steps with a railing? : A Little 6 Click Score: 18    End of Session Equipment Utilized During Treatment: Gait belt Activity Tolerance: Patient limited by fatigue Patient  left: in bed;with call bell/phone within reach;with bed alarm set Nurse Communication: Mobility status PT Visit Diagnosis: Other abnormalities of gait and mobility (R26.89);Unsteadiness on feet (R26.81);Repeated falls (R29.6)    Time: 1242-1300 PT Time Calculation (min) (ACUTE ONLY): 18 min   Charges:   PT Evaluation $PT Eval Low Complexity: 1 Low          Coree Brame S, PT DPT Acute Rehabilitation Services Pager [phone]  Office [phone]   Truddie Coco 09/30/2021, 4:23 PM

## 2021-09-30 NOTE — TOC Initial Note (Addendum)
Transition of Care [facility]) - Initial/Assessment Note    Patient Details  Name: Samuel [name] MRN: [id number] Date of Birth: [birth year]  Transition of Care [facility]) CM/SW Contact:    Samuel Pares, RN Phone Number: 09/30/2021, 8:19 AM  Clinical Narrative:                  Mr Samuel [name] is a 51 year old patient who presented to the ED with numbness, gneralized weakness, and frequent falls. He drinks alcohol every day. Results of imaging revealed no acute fractures, He is admitted with encephalopathy.  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 He will be encouraged to cease ETOH use, and provide resources for outpatient treatment, should he elect to do so.  He currently does not have a PCP, resources for CHW placed in patient instructions to follow up. Patient will likely need MATCH for medications.  PLEASE SEND MEDICATIONS TO TOC PHARMACY ( not on weekends) CM will follow for needs, recommendations, and transitions of care   Expected Discharge Plan: Home/Self Care Barriers to Discharge: Continued Medical Work up   Patient Goals and CMS Choice        Expected Discharge Plan and Services Expected Discharge Plan: Home/Self Care   Discharge Planning Services: CM Consult   Living arrangements for the past 2 months: Homeless                                      Prior Living Arrangements/Services Living arrangements for the past 2 months: Homeless Lives with:: Self Patient language and need for interpreter reviewed:: Yes (Spanish Speaking)        Need for Family Participation in Patient Care: Yes (Comment) Care giver support system in place?: No (comment)   Criminal Activity/Legal Involvement Pertinent to Current Situation/Hospitalization: No - Comment as needed  Activities of Daily Living      Permission Sought/Granted                  Emotional  Assessment       Orientation: : Fluctuating Orientation (Suspected and/or reported Sundowners) Alcohol / Substance Use: Alcohol Use Psych Involvement: No (comment)  Admission diagnosis:  Weakness [R53.1] Encephalopathy [G93.40] Generalized weakness [R53.1] Patient Active Problem List   Diagnosis Date Noted   Generalized weakness 09/29/2021   Wernicke's encephalopathy 09/29/2021   Encephalopathy 09/29/2021   Acute encephalopathy 09/09/2020   Electrolyte imbalance 09/09/2020   Fall 07/02/2020   Pancreatitis 06/17/2018   PCP:  Patient, No Pcp Per Pharmacy:   [id number] - [place], [place] - [address] AT [facility] OF [address] [place] [address] Phone: [phone] Fax: [phone]     Social Determinants of Health (SDOH) Interventions    Readmission Risk Interventions    07/21/2020    2:26 PM  Readmission Risk Prevention Plan  Medication Screening Complete  Transportation Screening Complete

## 2021-09-30 NOTE — Progress Notes (Signed)
PROGRESS NOTE        PATIENT DETAILS Name: [name] [name] Age: 51 y.o. Sex: male Date of Birth: Sep 23, 1970 Admit Date: 09/29/2021 Admitting Physician Emeline General, MD ZOX:[id number], No Pcp Per  Brief Summary: Patient is a 51 y.o.  male with longstanding history of EtOH use-presented with approximately 1 week history of tingling/numbness involving all 4 extremities and frequent falls.  He was subsequently admitted to the hospitalist service.  Significant events: 9/13>> admit to [facility]-frequent falls/tingling/numbness to all 4 extremities  Significant studies: 9/13>> CT head: No acute intracranial abnormalities 9/13>> CT C-spine: No fracture/dislocation 9/13>> CT T-spine: Sequelae of T7 compression and T10 Chance fractures-interval healing.  No acute osseous abnormality seen. 9/13>> CT L-spine: No acute osseous abnormality seen. 9/13>> CT chest: No acute traumatic injury.  Chronic right clavicle/scapula/bilateral rib fractures. 9/13>> CT abdomen/pelvis: No traumatic injury 9/13>> vitamin B12: 584 9/13>> vitamin B1: Pending   Significant microbiology data: None  Procedures: None  Consults: None  Subjective: Lying comfortably in bed-awake/alert.  Some tremors.  Was apparently tachycardic last night requiring IV Ativan.  Continues to complain of tingling/numbness sensation in the distal aspect of all 4 extremities.  He is easily moving all 4 extremities without any issues.  Objective: Vitals: Blood pressure 114/89, pulse (!) 112, temperature 97.9 F (36.6 C), temperature source Oral, resp. rate 15, height  (1.626 m), weight 55.1 kg, SpO2 97 %.   Exam: Gen Exam:Alert awake-not in any distress HEENT:atraumatic, normocephalic Chest: B/L clear to auscultation anteriorly CVS:S1S2 regular Abdomen:soft non tender, non distended Extremities:no edema Neurology: Non focal-sensation is grossly intact everywhere. 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  He has no motor weakness on my exam.  Gait not tested today-as awaiting PT/OT eval.  Continue high-dose IV thiamine-we will send out work-up including TSH/ANA/hepatitis serology and obtain an MRI of his brain.  If no improvement-we will reach out to neurology for further input.  EtOH withdrawal: Longstanding history of alcohol use-awake and alert but slightly tachycardic/tremulous this morning.  Continue Ativan per CIWA protocol.  Peripheral edema: Appears to be mild-likely due to hypoalbuminemia.  Recent echo with stable EF-no proteinuria on UA.  Hypomagnesemia/hypokalemia: Due to alcohol use-replete-recheck electrolytes tomorrow.  Normocytic anemia: No evidence of blood loss-probably due to alcohol use-bone marrow suppression.  But given neuropathy-check SPEP/UPEP/light chains..  Follow CBC.  Negative.  Thrombocytopenia: Likely due to alcohol use-mild-follow periodically.  BMI: Estimated body mass index is 20.85 kg/m as calculated from the following:   Height as of this encounter:  (1.626 m).  Weight as of this encounter: 55.1 kg.   Code status:   Code Status: Full Code   DVT Prophylaxis: enoxaparin (LOVENOX) injection 40 mg Start: 09/29/21 1000 SCDs Start: 09/29/21 9163   Family Communication: None at bedside   Disposition Plan: Status is:  Inpatient Remains inpatient appropriate because: Frequent falls-starting to withdraw from alcohol-needs PT/OT eval-see above documentation.  Not yet stable for discharge.   Planned Discharge Destination:Home over the next few days depending on clinical improvement.   Diet: Diet Order             Diet regular Room service appropriate? Yes; Fluid consistency: Thin  Diet effective now                     Antimicrobial agents: Anti-infectives (From admission, onward)    None        MEDICATIONS: Scheduled Meds:  enoxaparin (LOVENOX) injection  40 mg Subcutaneous Q24H   folic acid  1 mg Oral Daily   LORazepam  2 mg Oral BID   multivitamin with minerals  1 tablet Oral Daily   Continuous Infusions:  magnesium sulfate bolus IVPB 4 g (09/30/21 0843)   thiamine (VITAMIN B1) injection 500 mg (09/29/21 2247)   PRN Meds:.acetaminophen **OR** acetaminophen, LORazepam **OR** LORazepam, ondansetron **OR** ondansetron (ZOFRAN) IV, senna-docusate   I have personally reviewed following labs and imaging studies  LABORATORY DATA: CBC: Recent Labs  Lab 09/29/21 0258  WBC 11.7*  NEUTROABS 8.7*  HGB 10.3*  HCT 29.0*  MCV 98.3  PLT 141*    Basic Metabolic Panel: Recent Labs  Lab 09/29/21 0258 09/30/21 0415  NA 135  --   K 3.2* 3.6  CL 99  --   CO2 24  --   GLUCOSE 104*  --   BUN <5*  --   CREATININE 0.52*  --   CALCIUM 8.6*  --   MG 1.4* 1.5*  PHOS 2.8  --     GFR: Estimated Creatinine Clearance: 87.1 mL/min (A) (by C-G formula based on SCr of 0.52 mg/dL (L)).  Liver Function Tests: Recent Labs  Lab 09/29/21 0258 09/30/21 0415  AST 80* 79*  ALT 24 21  ALKPHOS 181* 205*  BILITOT 0.6 1.2  PROT 6.4* 6.2*  ALBUMIN 3.2* 2.9*   Recent Labs  Lab 09/29/21 0258  LIPASE 54*   Recent Labs  Lab 09/29/21 0258  AMMONIA 53*    Coagulation Profile: Recent Labs  Lab 09/29/21 0258  INR 1.3*    Cardiac Enzymes: No results for input(s): "CKTOTAL",  "CKMB", "CKMBINDEX", "TROPONINI" in the last 168 hours.  BNP (last 3 results) No results for input(s): "PROBNP" in the last 8760 hours.  Lipid Profile: No results for input(s): "CHOL", "HDL", "LDLCALC", "TRIG", "CHOLHDL", "LDLDIRECT" in the last 72 hours.  Thyroid Function Tests: No results for input(s): "TSH", "T4TOTAL", "FREET4", "T3FREE", "THYROIDAB" in the last 72 hours.  Anemia Panel: Recent Labs    09/29/21 0258  VITAMINB12 584  FERRITIN 214  TIBC 218*  IRON 89    Urine analysis:    Component Value Date/Time   COLORURINE YELLOW 09/29/2021 0258   APPEARANCEUR CLEAR 09/29/2021 0258   LABSPEC 1.004 (L) 09/29/2021 0258   PHURINE 7.0 09/29/2021 0258   GLUCOSEU NEGATIVE 09/29/2021 0258   HGBUR NEGATIVE 09/29/2021 0258   BILIRUBINUR NEGATIVE 09/29/2021 0258   KETONESUR NEGATIVE 09/29/2021 0258   PROTEINUR NEGATIVE 09/29/2021 0258   NITRITE NEGATIVE 09/29/2021 0258   LEUKOCYTESUR NEGATIVE 09/29/2021 0258  Sepsis Labs: Lactic Acid, Venous    Component Value Date/Time   LATICACIDVEN 2.7 (HH) 09/29/2021 1753    MICROBIOLOGY: No results found for this or any previous visit (from the past 240 hour(s)).  RADIOLOGY STUDIES/RESULTS: VAS [place] LOWER EXTREMITY VENOUS (DVT)  Result Date: 09/29/2021  Lower Venous DVT Study Patient Name:  [facility]  Date of Exam:   09/29/2021 Medical Rec #: [id number]                        Accession #:    [id number] Date of Birth: Jun 03, 1970                        Patient Gender: M Patient Age:   60 years Exam Location:  [facility] Procedure:      VAS [place] LOWER EXTREMITY VENOUS (DVT) Referring Phys: Mikey College --------------------------------------------------------------------------------  Indications: Edema.  Comparison Study: No prior studies. Performing Technologist: Jean Rosenthal RDMS, RVT  Examination Guidelines: A complete evaluation includes B-mode imaging, spectral Doppler, color Doppler, and power Doppler as  needed of all accessible portions of each vessel. Bilateral testing is considered an integral part of a complete examination. Limited examinations for reoccurring indications may be performed as noted. The reflux portion of the exam is performed with the patient in reverse Trendelenburg.  +---------+---------------+---------+-----------+----------+--------------+ RIGHT    CompressibilityPhasicitySpontaneityPropertiesThrombus Aging +---------+---------------+---------+-----------+----------+--------------+ CFV      Full           Yes      Yes                                 +---------+---------------+---------+-----------+----------+--------------+ SFJ      Full                                                        +---------+---------------+---------+-----------+----------+--------------+ FV Prox  Full                                                        +---------+---------------+---------+-----------+----------+--------------+ FV Mid   Full                                                        +---------+---------------+---------+-----------+----------+--------------+ FV DistalFull                                                        +---------+---------------+---------+-----------+----------+--------------+ PFV      Full                                                        +---------+---------------+---------+-----------+----------+--------------+  POP      Full           Yes      Yes                                 +---------+---------------+---------+-----------+----------+--------------+ PTV      Full                                                        +---------+---------------+---------+-----------+----------+--------------+ PERO     Full                                                        +---------+---------------+---------+-----------+----------+--------------+    +---------+---------------+---------+-----------+----------+--------------+ LEFT     CompressibilityPhasicitySpontaneityPropertiesThrombus Aging +---------+---------------+---------+-----------+----------+--------------+ CFV      Full           Yes      Yes                                 +---------+---------------+---------+-----------+----------+--------------+ SFJ      Full                                                        +---------+---------------+---------+-----------+----------+--------------+ FV Prox  Full                                                        +---------+---------------+---------+-----------+----------+--------------+ FV Mid   Full                                                        +---------+---------------+---------+-----------+----------+--------------+ FV DistalFull                                                        +---------+---------------+---------+-----------+----------+--------------+ PFV      Full                                                        +---------+---------------+---------+-----------+----------+--------------+ POP      Full           Yes      Yes                                 +---------+---------------+---------+-----------+----------+--------------+  PTV      Full                                                        +---------+---------------+---------+-----------+----------+--------------+ PERO     Full                                                        +---------+---------------+---------+-----------+----------+--------------+    Summary: RIGHT: - There is no evidence of deep vein thrombosis in the lower extremity.  - No cystic structure found in the popliteal fossa.  LEFT: - There is no evidence of deep vein thrombosis in the lower extremity.  - No cystic structure found in the popliteal fossa.  *See table(s) above for measurements and observations.    Preliminary    CT  L-SPINE NO CHARGE  Result Date: 09/29/2021 CLINICAL DATA:  51 year old homeless male with a history of fall. Extremity swelling, pain, numbness. History of T7 and T10 fractures in 2022. EXAM: CT LUMBAR SPINE WITH CONTRAST TECHNIQUE: Technique: Multiplanar CT images of the lumbar spine were reconstructed from contemporary CT of the Abdomen and Pelvis. RADIATION DOSE REDUCTION: This exam was performed according to the departmental dose-optimization program which includes automated exposure control, adjustment of the mA and/or kV according to patient size and/or use of iterative reconstruction technique. CONTRAST:  No additional COMPARISON:  CT thoracic spine, CT Chest, Abdomen, and Pelvis today reported separately. CT Abdomen and Pelvis 06/17/2018. FINDINGS: Segmentation: Normal, concordant with the thoracic spine numbering today. Alignment: Stable to improved lumbar lordosis compared to the 2020 CT. No spondylolisthesis or scoliosis. Vertebrae: Lumbar vertebrae appear stable and intact. Bone mineralization is within normal limits. Intact visible sacrum and SI joints. Paraspinal and other soft tissues: Abdominal and pelvic viscera are detailed separately. Lumbar paraspinal soft tissues are within normal limits. Disc levels: Mild lumbar disc bulging does not appear significantly changed since 2020. Some superimposed lumbar epidural lipomatosis (L5-S1). Trace vacuum disc at L2-L3. But otherwise no CT evidence of lumbar spinal stenosis. IMPRESSION: 1. No acute osseous abnormality in the lumbar spine. 2. Mild lumbar spine degeneration appears stable since 2020 with no CT evidence of spinal stenosis. 3. CT Chest, Abdomen, and Pelvis today are reported separately. Electronically Signed   By: Odessa FlemingH  Hall M.D.   On: 09/29/2021 05:47   CT T-SPINE NO CHARGE  Result Date: 09/29/2021 CLINICAL DATA:  51 year old homeless male with a history of fall. Extremity swelling, pain, numbness. History of T7 and T10 fractures in 2022.  EXAM: CT THORACIC SPINE WITHOUT CONTRAST TECHNIQUE: Multidetector CT images of the thoracic were obtained using the standard protocol without intravenous contrast. RADIATION DOSE REDUCTION: This exam was performed according to the departmental dose-optimization program which includes automated exposure control, adjustment of the mA and/or kV according to patient size and/or use of iterative reconstruction technique. COMPARISON:  CT cervical spine, chest, Abdomen, and Pelvis today are reported separately. Thoracic spine CT 07/02/2020.  Thoracic spine MRI 07/03/2020. FINDINGS: Limited cervical spine imaging: Cervicothoracic junction alignment is within normal limits. Thoracic spine segmentation: Normal. This is the same numbering system used last year. Alignment: Some posttraumatic exaggeration of thoracic kyphosis compared to 2022.  No significant spondylolisthesis. Mild dextroconvex thoracic scoliosis is chronic and stable. Vertebrae: Chronic T7 and T10 vertebral body fractures demonstrate some additional loss of height since last year. T10 vertebral chance type fracture previously with interval healing of the horizontal posterior element fracture plane. Focal kyphosis at T10 with maintained posterior element alignment. T1 through T6 appears stable and intact. T8 and T9 appears stable and intact. T11 and T12 appears stable and intact. Chronic bilateral 1st rib, And right 2nd and 3rd rib costovertebral junction fractures. No acute osseous abnormality identified. Paraspinal and other soft tissues: Chest and abdominal viscera are detailed separately today. Thoracic paraspinal soft tissues are within normal limits. Disc levels: Some thoracic disc and endplate degeneration associated with the T7 and T10 vertebral trauma. But no CT evidence of thoracic spinal stenosis. IMPRESSION: 1. Sequelae of 2022 T7 compression and T10 Chance Fractures. Interval healing with some loss of height at both levels. Subsequent focal kyphosis  at T10. 2. No associated thoracic spinal stenosis by CT. And no acute osseous abnormality in the thoracic spine. 3.  CT Chest, Abdomen, and Pelvis today are reported separately. Electronically Signed   By: Odessa Fleming M.D.   On: 09/29/2021 05:43   CT CHEST ABDOMEN PELVIS W CONTRAST  Result Date: 09/29/2021 CLINICAL DATA:  51 year old homeless male with a history of fall. Extremity swelling, pain, numbness. History of T7 and T10 fractures in 2022. EXAM: CT CHEST, ABDOMEN, AND PELVIS WITH CONTRAST TECHNIQUE: Multidetector CT imaging of the chest, abdomen and pelvis was performed following the standard protocol during bolus administration of intravenous contrast. RADIATION DOSE REDUCTION: This exam was performed according to the departmental dose-optimization program which includes automated exposure control, adjustment of the mA and/or kV according to patient size and/or use of iterative reconstruction technique. CONTRAST:  50mL OMNIPAQUE IOHEXOL 350 MG/ML SOLN COMPARISON:  CT thoracic and lumbar spine today are detailed separately. CT Chest, Abdomen, and Pelvis 07/02/2020. FINDINGS: CT CHEST FINDINGS Cardiovascular: Borderline to mild cardiomegaly does not appear significantly changed from last year. No pericardial effusion. Negative thoracic aorta. No calcified coronary artery atherosclerosis is evident. The central pulmonary arteries are enhancing and grossly patent. Mediastinum/Nodes: No mediastinal mass or lymphadenopathy. There are chronic densely calcified small left hilar and AP window lymph nodes which are postinflammatory, post granulomatous and stable. Lungs/Pleura: Major airways are patent. Low lung volumes similar to the chest CT last year. Curvilinear scarring along the right minor fissure appears increased. Superimposed areas of patchy and dependent atelectasis as well as gas trapping resulting in mosaic attenuation. No consolidation. No pleural effusion. No lung mass identified. Musculoskeletal:  Thoracic spine is detailed separately. Chronic right clavicle, scapula, and bilateral rib fractures. Intact sternum. CT ABDOMEN PELVIS FINDINGS Hepatobiliary: Pronounced gallbladder layering sludge or nonshadowing gravel type stones on series 4, image 54. Mild gallbladder wall thickening, and possible early pericholecystic inflammation on series 7, image 40. Enhancement of the adjacent liver is within normal limits and hepatic steatosis has regressed from last year. No bile duct dilatation. Pancreas: Pancreas is within normal limits. Spleen: Negative. Adrenals/Urinary Tract: Normal adrenal glands. Nonobstructed kidneys with symmetric renal enhancement. No nephrolithiasis or pararenal inflammation. No renal mass. Urinary bladder is distended but otherwise unremarkable. No hydroureter. Stomach/Bowel: Redundant large bowel with retained low-density stool throughout. Normal appendix tracks toward the midline on coronal image 57. No large bowel inflammation identified. Negative terminal ileum. Fluid-filled but nondilated small bowel in the abdomen and pelvis. Stomach and duodenum are decompressed. No free air or free fluid. Vascular/Lymphatic:  Major arterial structures in the abdomen and pelvis are patent with no significant atherosclerosis. Portal venous system is patent. No lymphadenopathy identified. Reproductive: Negative. Other: No pelvic free fluid. Musculoskeletal: Lumbar spine is detailed separately. Sacrum, SI joints, pelvis, and proximal right femur appear intact. Chronic deformity of the proximal left femur is stable. IMPRESSION: CHEST: 1. Chronic posttraumatic and mild post granulomatous changes in the chest. 2. Low lung volumes with pulmonary scarring and atelectasis. 3. Thoracic spine is detailed separately. ABDOMEN AND PELVIS: 1. Gallbladder layering sludge with mild gallbladder wall thickening and questionable early gallbladder inflammation. Chronic cholecystitis is possible but cannot exclude Acute  Cholecystitis. Right Upper Quadrant Ultrasound recommended if there is right upper quadrant pain. 2. Distended gallbladder, query urinary retention. No associated hydronephrosis or hydroureter. 3. No other acute or inflammatory process identified in the abdomen or pelvis. 4. Lumbar spine is detailed separately. Electronically Signed   By: Odessa Fleming M.D.   On: 09/29/2021 05:37   CT CERVICAL SPINE WO CONTRAST  Result Date: 09/29/2021 CLINICAL DATA:  51 year old homeless male with a history of fall. Extremity swelling, pain, numbness. History of T7 and T10 fractures in 2022. EXAM: CT CERVICAL SPINE WITHOUT CONTRAST TECHNIQUE: Multidetector CT imaging of the cervical spine was performed without intravenous contrast. Multiplanar CT image reconstructions were also generated. RADIATION DOSE REDUCTION: This exam was performed according to the departmental dose-optimization program which includes automated exposure control, adjustment of the mA and/or kV according to patient size and/or use of iterative reconstruction technique. COMPARISON:  Cervical spine CT 09/02/2021. Cervical spine MRI 07/03/2020. FINDINGS: Alignment: Chronic straightening of cervical lordosis. Cervicothoracic junction alignment is within normal limits. Bilateral posterior element alignment is within normal limits. Skull base and vertebrae: Visualized skull base is intact. No atlanto-occipital dissociation. C1 and C2 appear intact and aligned. No acute osseous abnormality identified. Soft tissues and spinal canal: No prevertebral fluid or swelling. No visible canal hematoma. Some calcified carotid atherosclerosis. Otherwise negative visible noncontrast neck soft tissues. Disc levels: Multilevel cervical spine disc bulging with endplate spurring. This appears not significantly changed from June 2022 when MRI demonstrated no significant spinal stenosis. Upper chest: Chronic left 1st rib fracture. Chronic right clavicle fracture. Chronic posterior right  2nd and 3rd rib costovertebral junction fractures. Chest CT today is reported separately. IMPRESSION: 1. No acute traumatic injury identified in the cervical spine. 2. Stable cervical spine degeneration, with no associated cervical spinal stenosis by MRI last year. 3. Chronic upper rib and clavicle fractures. Chest CT today is reported separately. Electronically Signed   By: Odessa Fleming M.D.   On: 09/29/2021 05:27   CT HEAD WO CONTRAST ( )  Result Date: 09/29/2021 CLINICAL DATA:  51 year old homeless male with a history of fall. Extremity swelling, pain, numbness. History of T7 and T10 fractures in 2022. EXAM: CT HEAD WITHOUT CONTRAST TECHNIQUE: Contiguous axial images were obtained from the base of the skull through the vertex without intravenous contrast. RADIATION DOSE REDUCTION: This exam was performed according to the departmental dose-optimization program which includes automated exposure control, adjustment of the mA and/or kV according to patient size and/or use of iterative reconstruction technique. COMPARISON:  Head CT 09/02/2021 and earlier.  Brain MRI 09/09/2020. FINDINGS: Brain: Chronic cerebral volume loss for age appears stable. No midline shift, ventriculomegaly, mass effect, evidence of mass lesion, intracranial hemorrhage or evidence of cortically based acute infarction. Gray-white matter differentiation remains within normal limits. Vascular: Faint Calcified atherosclerosis at the skull base. No suspicious intracranial vascular hyperdensity. Skull: Chronic appearing  nasal bone fractures appear stable from last year. No acute osseous abnormality identified. Sinuses/Orbits: Scattered mild paranasal sinus mucosal thickening has not significantly changed. Tympanic cavities and mastoids remain clear. Other: Visualized orbits and scalp soft tissues are within normal limits. IMPRESSION: 1. No acute intracranial abnormality. Chronic advanced for age cerebral volume loss. 2. Chronic nasal bone fractures.  Electronically Signed   By: Odessa Fleming M.D.   On: 09/29/2021 05:23     LOS: 1 day   Jeoffrey Massed, MD  [facility]    To contact the attending provider between 7A-7P or the covering provider during after hours 7P-7A, please log into the web site [email or web address] and access using universal [place] password for that web site. If you do not have the password, please call the hospital operator.  09/30/2021, 9:55 AM

## 2021-10-01 LAB — COMPREHENSIVE METABOLIC PANEL
ALT: 18 U/L (ref 0–44)
AST: 61 U/L — ABNORMAL HIGH (ref 15–41)
Albumin: 2.9 g/dL — ABNORMAL LOW (ref 3.5–5.0)
Alkaline Phosphatase: 177 U/L — ABNORMAL HIGH (ref 38–126)
Anion gap: 8 (ref 5–15)
BUN: 7 mg/dL (ref 6–20)
CO2: 27 mmol/L (ref 22–32)
Calcium: 8.6 mg/dL — ABNORMAL LOW (ref 8.9–10.3)
Chloride: 101 mmol/L (ref 98–111)
Creatinine, Ser: 0.48 mg/dL — ABNORMAL LOW (ref 0.61–1.24)
GFR, Estimated: >60 mL/min (ref >60)
Glucose, Bld: 111 mg/dL — ABNORMAL HIGH (ref 70–99)
Potassium: 3.6 mmol/L (ref 3.5–5.1)
Sodium: 136 mmol/L (ref 135–145)
Total Bilirubin: 1 mg/dL (ref 0.3–1.2)
Total Protein: 6.3 g/dL — ABNORMAL LOW (ref 6.5–8.1)

## 2021-10-01 LAB — HCV INTERPRETATION

## 2021-10-01 LAB — MAGNESIUM: Magnesium: 1.5 mg/dL — ABNORMAL LOW (ref 1.7–2.4)

## 2021-10-01 LAB — CBC
HCT: 30.9 % — ABNORMAL LOW (ref 39.0–52.0)
Hemoglobin: 10.8 g/dL — ABNORMAL LOW (ref 13.0–17.0)
MCH: 34.4 pg — ABNORMAL HIGH (ref 26.0–34.0)
MCHC: 35 g/dL (ref 30.0–36.0)
MCV: 98.4 fL (ref 80.0–100.0)
Platelets: 148 10*3/uL — ABNORMAL LOW (ref 150–400)
RBC: 3.14 MIL/uL — ABNORMAL LOW (ref 4.22–5.81)
RDW: 16 % — ABNORMAL HIGH (ref 11.5–15.5)
WBC: 10.3 10*3/uL (ref 4.0–10.5)
nRBC: 0 % (ref 0.0–0.2)

## 2021-10-01 LAB — ENA+DNA/DS+ANTICH+CENTRO+JO...
Anti JO-1: <0.2 AI (ref 0.0–0.9)
Centromere Ab Screen: <0.2 AI (ref 0.0–0.9)
Chromatin Ab SerPl-aCnc: <0.2 AI (ref 0.0–0.9)
ENA SM Ab Ser-aCnc: <0.2 AI (ref 0.0–0.9)
Ribonucleic Protein: 0.2 AI (ref 0.0–0.9)
SSA (Ro) (ENA) Antibody, IgG: <0.2 AI (ref 0.0–0.9)
SSB (La) (ENA) Antibody, IgG: <0.2 AI (ref 0.0–0.9)
Scleroderma (Scl-70) (ENA) Antibody, IgG: <0.2 AI (ref 0.0–0.9)
ds DNA Ab: 13 IU/mL — ABNORMAL HIGH (ref 0–9)

## 2021-10-01 LAB — HCV AB W REFLEX TO QUANT PCR: HCV Ab: NONREACTIVE

## 2021-10-01 LAB — KAPPA/LAMBDA LIGHT CHAINS
Kappa free light chain: 42.3 mg/L — ABNORMAL HIGH (ref 3.3–19.4)
Kappa, lambda light chain ratio: 1.03 (ref 0.26–1.65)
Lambda free light chains: 41 mg/L — ABNORMAL HIGH (ref 5.7–26.3)

## 2021-10-01 LAB — ANA W/REFLEX IF POSITIVE: Anti Nuclear Antibody (ANA): POSITIVE — AB

## 2021-10-01 LAB — GLUCOSE, CAPILLARY: Glucose-Capillary: 101 mg/dL — ABNORMAL HIGH (ref 70–99)

## 2021-10-01 LAB — PHOSPHORUS: Phosphorus: 3.4 mg/dL (ref 2.5–4.6)

## 2021-10-01 MED ORDER — THIAMINE HCL 100 MG/ML IJ SOLN
500.0000 mg | Freq: Three times a day (TID) | INTRAVENOUS | Status: AC
  Administered 2021-10-01 – 2021-10-03 (×8): 500 mg via INTRAVENOUS
  Filled 2021-10-01 (×8): qty 5

## 2021-10-01 MED ORDER — POTASSIUM CHLORIDE CRYS ER 20 MEQ PO TBCR
20.0000 meq | EXTENDED_RELEASE_TABLET | Freq: Once | ORAL | Status: AC
  Administered 2021-10-01: 20 meq via ORAL
  Filled 2021-10-01: qty 1

## 2021-10-01 MED ORDER — MAGNESIUM SULFATE 4 GM/100ML IV SOLN
4.0000 g | Freq: Once | INTRAVENOUS | Status: AC
  Administered 2021-10-01: 4 g via INTRAVENOUS
  Filled 2021-10-01: qty 100

## 2021-10-01 MED ORDER — LORAZEPAM 2 MG/ML IJ SOLN
0.0000 mg | Freq: Four times a day (QID) | INTRAMUSCULAR | Status: AC
  Administered 2021-10-01: 2 mg via INTRAVENOUS
  Administered 2021-10-02 (×4): 4 mg via INTRAVENOUS
  Filled 2021-10-01: qty 1
  Filled 2021-10-01 (×4): qty 2

## 2021-10-01 MED ORDER — LORAZEPAM 1 MG PO TABS
1.0000 mg | ORAL_TABLET | ORAL | Status: AC | PRN
  Administered 2021-10-01: 1 mg via ORAL
  Filled 2021-10-01: qty 1

## 2021-10-01 MED ORDER — LORAZEPAM 2 MG/ML IJ SOLN
1.0000 mg | INTRAMUSCULAR | Status: AC | PRN
  Administered 2021-10-02 (×2): 3 mg via INTRAVENOUS
  Administered 2021-10-03: 2 mg via INTRAVENOUS
  Filled 2021-10-01 (×2): qty 2
  Filled 2021-10-01: qty 1

## 2021-10-01 MED ORDER — LORAZEPAM 2 MG/ML IJ SOLN
0.0000 mg | Freq: Two times a day (BID) | INTRAMUSCULAR | Status: AC
  Administered 2021-10-04 – 2021-10-05 (×2): 1 mg via INTRAVENOUS
  Filled 2021-10-01 (×2): qty 1

## 2021-10-01 NOTE — Progress Notes (Signed)
PROGRESS NOTE        PATIENT DETAILS Name: Samuel [name] Age: 51 y.o. Sex: male Date of Birth: 1970-01-24 Admit Date: 09/29/2021 Admitting Physician Emeline General, MD [id number], No Pcp Per  Brief Summary: Patient is a 51 y.o.  male with longstanding history of EtOH use-presented with approximately 1 week history of tingling/numbness involving all 4 extremities and frequent falls.  He was subsequently admitted to the hospitalist service.  Significant events: 9/13>> admit to [facility]-frequent falls/tingling/numbness to all 4 extremities  Significant studies: 9/13>> CT head: No acute intracranial abnormalities 9/13>> CT C-spine: No fracture/dislocation 9/13>> CT T-spine: Sequelae of T7 compression and T10 Chance fractures-interval healing.  No acute osseous abnormality seen. 9/13>> CT L-spine: No acute osseous abnormality seen. 9/13>> CT chest: No acute traumatic injury.  Chronic right clavicle/scapula/bilateral rib fractures. 9/13>> CT abdomen/pelvis: No traumatic injury 9/13>> vitamin B12: 584 9/13>> vitamin B1: Pending 9/14>> MRI brain: No acute intracranial abnormality. 9/14>> SPEP/light chain: Pending 9/14>> HBsAg/anti-HCV Ab: Negative 9/14>> A1c 5.4 9/14>> TSH: 11.067.    Significant microbiology data: None  Procedures: None  Consults: None  Subjective: Much more confused today having some tremors.  Follows some commands.  Attempting to get out of bed when I was trying to see him this morning.  Objective: Vitals: Blood pressure 115/81, pulse (!) 104, temperature 98.2 F (36.8 C), temperature source Oral, resp. rate 15, height 5\' 4"  (1.626 m), weight 53.6 kg, SpO2 97 %.   Exam: Gen Exam: Confused HEENT:atraumatic, normocephalic Chest: B/L clear to auscultation anteriorly CVS:S1S2 regular Abdomen:soft non tender, non distended Extremities:no edema Neurology: Sickle exam but moving all 4 extremities.  Some  tremors. Skin: no rash   Pertinent Labs/Radiology:    Latest Ref Rng & Units 10/01/2021    6:52 AM 09/29/2021    2:58 AM 09/02/2021    2:30 AM  CBC  WBC 4.0 - 10.5 K/uL 10.3  11.7  7.7   Hemoglobin 13.0 - 17.0 g/dL 09/04/2021  03.1  59.4   Hematocrit 39.0 - 52.0 % 30.9  29.0  32.9   Platelets 150 - 400 K/uL 148  141  101     Lab Results  Component Value Date   NA 136 10/01/2021   K 3.6 10/01/2021   CL 101 10/01/2021   CO2 27 10/01/2021      Assessment/Plan: Frequent falls/unsteady gait/peripheral neuropathy: Unclear whether this is Wernicke's encephalopathy or a 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  He has no motor weakness on my exam.  Work-up as above-continue to work with PT OT as mentation allows.  Continue high-dose IV thiamine.  EtOH withdrawal: Longstanding history of alcohol use-looks like he has had more withdrawal symptoms-now more confused than tremulous.  Remains tachycardic.  Continue Ativan for CIWA protocol.    Peripheral edema: Appears to be mild-likely due to hypoalbuminemia.  Recent echo with stable EF-no proteinuria on UA.  Hypomagnesemia/hypokalemia: Due to alcohol use-continue to replete and recheck.  Normocytic anemia: No evidence of blood loss-probably due to alcohol use-bone marrow suppression.  But given neuropathy- SPEP/UPEP/light chains-ordered and pending.  Continue to follow CBC periodically.  Thrombocytopenia: Likely due to alcohol use-mild-follow periodically.  Hypothyroidism: TSH mildly elevated-check free T4 with a.m. labs.  BMI: Estimated body mass index is 20.28 kg/m as calculated from the following:  Height as of this encounter: 5\' 4"  (1.626 m).   Weight as of this encounter: 53.6 kg.   Code status:   Code Status: Full Code   DVT Prophylaxis: enoxaparin (LOVENOX) injection 40 mg Start: 09/29/21 1000 SCDs Start: 09/29/21 10/01/21   Family Communication: None at bedside   Disposition  Plan: Status is: Inpatient Remains inpatient appropriate because: Alcohol withdrawal symptoms-not yet stable for discharge.   Planned Discharge Destination:Home likely next week.  Diet: Diet Order             Diet regular Room service appropriate? Yes; Fluid consistency: Thin  Diet effective now                     Antimicrobial agents: Anti-infectives (From admission, onward)    None        MEDICATIONS: Scheduled Meds:  enoxaparin (LOVENOX) injection  40 mg Subcutaneous Q24H   folic acid  1 mg Oral Daily   LORazepam  0-4 mg Intravenous Q6H   Followed by   1610 ON 10/03/2021] LORazepam  0-4 mg Intravenous Q12H   multivitamin with minerals  1 tablet Oral Daily   Continuous Infusions:  thiamine (VITAMIN B1) injection     PRN Meds:.acetaminophen **OR** acetaminophen, LORazepam **OR** LORazepam, ondansetron **OR** ondansetron (ZOFRAN) IV, senna-docusate   I have personally reviewed following labs and imaging studies  LABORATORY DATA: CBC: Recent Labs  Lab 09/29/21 0258 10/01/21 0652  WBC 11.7* 10.3  NEUTROABS 8.7*  --   HGB 10.3* 10.8*  HCT 29.0* 30.9*  MCV 98.3 98.4  PLT 141* 148*     Basic Metabolic Panel: Recent Labs  Lab 09/29/21 0258 09/30/21 0415 10/01/21 0652  NA 135  --  136  K 3.2* 3.6 3.6  CL 99  --  101  CO2 24  --  27  GLUCOSE 104*  --  111*  BUN <5*  --  7  CREATININE 0.52*  --  0.48*  CALCIUM 8.6*  --  8.6*  MG 1.4* 1.5* 1.5*  PHOS 2.8  --  3.4     GFR: Estimated Creatinine Clearance: 84.7 mL/min (A) (by C-G formula based on SCr of 0.48 mg/dL (L)).  Liver Function Tests: Recent Labs  Lab 09/29/21 0258 09/30/21 0415 10/01/21 0652  AST 80* 79* 61*  ALT 24 21 18   ALKPHOS 181* 205* 177*  BILITOT 0.6 1.2 1.0  PROT 6.4* 6.2* 6.3*  ALBUMIN 3.2* 2.9* 2.9*    Recent Labs  Lab 09/29/21 0258  LIPASE 54*    Recent Labs  Lab 09/29/21 0258  AMMONIA 53*     Coagulation Profile: Recent Labs  Lab 09/29/21 0258   INR 1.3*     Cardiac Enzymes: No results for input(s): "CKTOTAL", "CKMB", "CKMBINDEX", "TROPONINI" in the last 168 hours.  BNP (last 3 results) No results for input(s): "PROBNP" in the last 8760 hours.  Lipid Profile: No results for input(s): "CHOL", "HDL", "LDLCALC", "TRIG", "CHOLHDL", "LDLDIRECT" in the last 72 hours.  Thyroid Function Tests: Recent Labs    09/30/21 1203  TSH 11.067*    Anemia Panel: Recent Labs    09/29/21 0258  VITAMINB12 584  FERRITIN 214  TIBC 218*  IRON 89     Urine analysis:    Component Value Date/Time   COLORURINE YELLOW 09/29/2021 0258   APPEARANCEUR CLEAR 09/29/2021 0258   LABSPEC 1.004 (L) 09/29/2021 0258   PHURINE 7.0 09/29/2021 0258   GLUCOSEU NEGATIVE 09/29/2021 0258   HGBUR NEGATIVE 09/29/2021 0258  BILIRUBINUR NEGATIVE 09/29/2021 0258   KETONESUR NEGATIVE 09/29/2021 0258   PROTEINUR NEGATIVE 09/29/2021 0258   NITRITE NEGATIVE 09/29/2021 0258   LEUKOCYTESUR NEGATIVE 09/29/2021 0258    Sepsis Labs: Lactic Acid, Venous    Component Value Date/Time   LATICACIDVEN 2.7 (HH) 09/29/2021 1753    MICROBIOLOGY: No results found for this or any previous visit (from the past 240 hour(s)).  RADIOLOGY STUDIES/RESULTS: MR BRAIN WO CONTRAST  Result Date: 09/30/2021 CLINICAL DATA:  Worsening numbness and weakness in arms and legs. Frequent falls and leg swelling. EXAM: MRI HEAD WITHOUT CONTRAST TECHNIQUE: Multiplanar, multiecho pulse sequences of the brain and surrounding structures were obtained without intravenous contrast. COMPARISON:  CT head 1 day prior. FINDINGS: Brain: There is no acute intracranial hemorrhage, extra-axial fluid collection, or acute infarct There is mild cerebral and cerebellar small foci of FLAIR signal abnormality in the supratentorial white matter are nonspecific but likely reflects sequela of mild chronic small vessel ischemic change. There is a small focus of cortical encephalomalacia in the left  temporoparietal region, unchanged. 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 There is no suspicious parenchymal signal abnormality. There is no mass lesion. There is no mass effect or midline shift. Volume loss with prominence of the ventricular system and extra-axial CSF spaces. Vascular: Normal flow voids. Skull and upper cervical spine: Normal marrow signal. Sinuses/Orbits: The paranasal sinuses are clear. The globes and orbits are unremarkable. Other: None. IMPRESSION: 1. No acute intracranial pathology. 2. Stable chronic findings as above. Electronically Signed   By: Lesia Hausen M.D.   On: 09/30/2021 13:04     LOS: 2 days   Jeoffrey Massed, MD  [facility]    To contact the attending provider between 7A-7P or the covering provider during after hours 7P-7A, please log into the web site [email or web address] and access using universal [place] password for that web site. If you do not have the password, please call the hospital operator.  10/01/2021, 1:26 PM

## 2021-10-01 NOTE — Progress Notes (Signed)
Patient has repeatedly attempted to climb out of bed. Bathroom needs have been addressed. Patient assisted to the bathroom and his gait is extremely unsteady. Education provided to patient about his safety yet he continues to attempt to climb out of bed. Patient also attempting to remove hospital gown and cardiac monitoring. Patient has remained restless during the night. CIWA protocol in effect. Patient monitored for safety. Bed exit alarm remains engaged. Fluids and nutrition have been offered and provided during the shift. Will continue to monitor

## 2021-10-01 NOTE — Evaluation (Signed)
Occupational Therapy Evaluation Patient Details Name: Samuel [name] MRN: [id number] DOB: April 03, 1970 Today's Date: 10/01/2021   History of Present Illness 51 yo male presents to [facility] on 9/13 with bilat hand and feet numbness, LE swelling, frequent falls. CTH negative for acute findings. Workup for Wernicke's encephalopathy. Recent admission 8/23 for syncope and withdrawals. PMH: alcohol abuse, fall 1 year ago resulting in subarachnoid hemorrhage and thoracic fractures, homelessness.   Clinical Impression   Patient admitted for the diagnosis above.  He presents with incoordination to bilateral upper and lower extremities, poor safety awareness and insight to deficits, and poor dynamic balance.  If patient is able to get to a local outpatient rehab, he would benefit from coordination and strengthening activities to improve basic self care independence. The patient will need initial 24 hour support to reduce falls, potential injuries, and to manage any medications needed.  OT will follow in the acute setting.             Recommendations for follow up therapy are one component of a multi-disciplinary discharge planning process, led by the attending physician.  Recommendations may be updated based on patient status, additional functional criteria and insurance authorization.   Follow Up Recommendations  Outpatient OT    Assistance Recommended at Discharge Frequent or constant Supervision/Assistance  Patient can return home with the following A little help with bathing/dressing/bathroom;A little help with walking and/or transfers;Assist for transportation;Help with stairs or ramp for entrance;Direct supervision/assist for medications management;Assistance with cooking/housework;Direct supervision/assist for financial management    Functional Status Assessment  Patient has had a recent decline in their functional status and demonstrates the ability to make significant improvements in  function in a reasonable and predictable amount of time.  Equipment Recommendations  BSC/3in1    Recommendations for Other Services       Precautions / Restrictions Precautions Precautions: Fall Restrictions Weight Bearing Restrictions: No      Mobility Bed Mobility Overal bed mobility: Needs Assistance Bed Mobility: Supine to Sit     Supine to sit: Min assist          Transfers Overall transfer level: Needs assistance Equipment used: Rolling walker (2 wheels) Transfers: Sit to/from Stand Sit to Stand: Min assist                  Balance Overall balance assessment: Needs assistance, History of Falls Sitting-balance support: No upper extremity supported, Feet supported Sitting balance-Leahy Scale: Fair Sitting balance - Comments: impulsive   Standing balance support: Reliant on assistive device for balance Standing balance-Leahy Scale: Poor                             ADL either performed or assessed with clinical judgement   ADL Overall ADL's : Needs assistance/impaired Eating/Feeding: Minimal assistance;Sitting   Grooming: Wash/dry hands;Minimal assistance;Standing           Upper Body Dressing : Moderate assistance;Sitting   Lower Body Dressing: Moderate assistance;Sit to/from stand   Toilet Transfer: Moderate assistance;Rolling walker (2 wheels);Ambulation;Regular Toilet                   Vision   Vision Assessment?: No apparent visual deficits     Perception Perception Perception: Not tested   Praxis Praxis Praxis: Not tested    Pertinent Vitals/Pain Pain Assessment Pain Assessment: Faces Faces Pain Scale: No hurt Pain Intervention(s): Monitored during session     Hand Dominance  Right   Extremity/Trunk Assessment Upper Extremity Assessment Upper Extremity Assessment: RUE deficits/detail;LUE deficits/detail RUE Sensation: decreased light touch RUE Coordination: decreased fine motor;decreased gross  motor LUE Sensation: decreased light touch LUE Coordination: decreased fine motor;decreased gross motor   Lower Extremity Assessment Lower Extremity Assessment: Defer to PT evaluation   Cervical / Trunk Assessment Cervical / Trunk Assessment: Normal   Communication Communication Communication: Prefers language other than English   Cognition Arousal/Alertness: Awake/alert Behavior During Therapy: Flat affect                       Current Attention Level: Sustained     Safety/Judgement: Decreased awareness of safety, Decreased awareness of deficits   Problem Solving: Slow processing, Decreased initiation, Difficulty sequencing                        Home Living Family/patient expects to be discharged to:: Private residence Living Arrangements: Non-relatives/Friends Available Help at Discharge: Friend(s) Type of Home: House Home Access: Stairs to enter     Home Layout: Able to live on main level with bedroom/bathroom     Bathroom Shower/Tub: Chief Strategy Officer: Standard Bathroom Accessibility: Yes How Accessible: Accessible via walker Home Equipment: Rolling Walker (2 wheels)          Prior Functioning/Environment Prior Level of Function : History of Falls (last six months)             Mobility Comments: Using RW at baseline with falls ADLs Comments: assist as needed ADL and iADL.  Does not drive        OT Problem List: Decreased strength;Decreased activity tolerance;Impaired balance (sitting and/or standing);Decreased safety awareness;Decreased coordination      OT Treatment/Interventions: Self-care/ADL training;Balance training;Therapeutic activities;DME and/or AE instruction    OT Goals(Current goals can be found in the care plan section) Acute Rehab OT Goals OT Goal Formulation: Patient unable to participate in goal setting Time For Goal Achievement: 10/15/21 Potential to Achieve Goals: Fair ADL Goals Pt Will  Perform Grooming: with supervision;standing Pt Will Perform Upper Body Bathing: with supervision;sitting Pt Will Perform Lower Body Bathing: with min guard assist;sit to/from stand Pt Will Perform Upper Body Dressing: with min guard assist;sitting Pt Will Perform Lower Body Dressing: with min assist;sit to/from stand Pt Will Transfer to Toilet: with supervision;ambulating;regular height toilet  OT Frequency: Min 2X/week    Co-evaluation              AM-PAC OT "6 Clicks" Daily Activity     Outcome Measure Help from another person eating meals?: A Little Help from another person taking care of personal grooming?: A Little Help from another person toileting, which includes using toliet, bedpan, or urinal?: A Little Help from another person bathing (including washing, rinsing, drying)?: A Lot Help from another person to put on and taking off regular upper body clothing?: A Lot Help from another person to put on and taking off regular lower body clothing?: A Lot 6 Click Score: 15   End of Session Equipment Utilized During Treatment: Rolling walker (2 wheels);Gait belt  Activity Tolerance: Patient tolerated treatment well Patient left: in chair;with call bell/phone within reach;with chair alarm set  OT Visit Diagnosis: Unsteadiness on feet (R26.81);Ataxia, unspecified (R27.0);Other symptoms and signs involving cognitive function                Time: 1531-1551 OT Time Calculation (min): 20 min Charges:  OT General Charges $OT Visit:  1 Visit OT Evaluation $OT Eval Moderate Complexity: 1 Mod  10/01/2021  RP, OTR/L  Acute Rehabilitation Services  Office:  [phone]   Suzanna Obey 10/01/2021, 4:04 PM

## 2021-10-01 NOTE — TOC Progression Note (Signed)
Transition of Care [facility]) - Progression Note    Patient Details  Name: Samuel [name] MRN: [id number] Date of Birth: [birth year]  Transition of [facility]) CM/SW Contact  Mearl Latin, LCSW Phone Number: 10/01/2021, 11:54 AM  Clinical Narrative:    Patient currently with confusion. CSW placed community resources in Spanish on patient's hard chart for discharge.    Expected Discharge Plan: Home/Self Care Barriers to Discharge: Continued Medical Work up  Expected Discharge Plan and Services Expected Discharge Plan: Home/Self Care   Discharge Planning Services: CM Consult   Living arrangements for the past 2 months: Homeless                                       Social Determinants of Health (SDOH) Interventions    Readmission Risk Interventions    07/21/2020    2:26 PM  Readmission Risk Prevention Plan  Medication Screening Complete  Transportation Screening Complete

## 2021-10-02 LAB — BASIC METABOLIC PANEL
Anion gap: 9 (ref 5–15)
BUN: 8 mg/dL (ref 6–20)
CO2: 24 mmol/L (ref 22–32)
Calcium: 8.8 mg/dL — ABNORMAL LOW (ref 8.9–10.3)
Chloride: 100 mmol/L (ref 98–111)
Creatinine, Ser: 0.44 mg/dL — ABNORMAL LOW (ref 0.61–1.24)
GFR, Estimated: >60 mL/min (ref >60)
Glucose, Bld: 93 mg/dL (ref 70–99)
Potassium: 4.1 mmol/L (ref 3.5–5.1)
Sodium: 133 mmol/L — ABNORMAL LOW (ref 135–145)

## 2021-10-02 LAB — T4, FREE: Free T4: 0.81 ng/dL (ref 0.61–1.12)

## 2021-10-02 LAB — MAGNESIUM: Magnesium: 1.6 mg/dL — ABNORMAL LOW (ref 1.7–2.4)

## 2021-10-02 LAB — PHOSPHORUS: Phosphorus: 3.9 mg/dL (ref 2.5–4.6)

## 2021-10-02 MED ORDER — MAGNESIUM SULFATE 4 GM/100ML IV SOLN
4.0000 g | Freq: Once | INTRAVENOUS | Status: AC
  Administered 2021-10-02: 4 g via INTRAVENOUS
  Filled 2021-10-02: qty 100

## 2021-10-02 NOTE — Progress Notes (Signed)
PROGRESS NOTE        PATIENT DETAILS Name: Samuel [name] Age: 51 y.o. Sex: male Date of Birth: Jan 22, 1970 Admit Date: 09/29/2021 Admitting Physician Lequita Halt, MD [id number], No Pcp Per  Brief Summary: Patient is a 51 y.o.  male with longstanding history of EtOH use-presented with approximately 1 week history of tingling/numbness involving all 4 extremities and frequent falls.  He was admitted to the hospitalist service-started on a high-dose IV thiamine-unfortunately hospital course complicated by alcohol withdrawal symptoms.  Significant events: 9/13>> admit to [facility]-frequent falls/tingling/numbness to all 4 extremities  Significant studies: 9/13>> CT head: No acute intracranial abnormalities 9/13>> CT C-spine: No fracture/dislocation 9/13>> CT T-spine: Sequelae of T7 compression and T10 Chance fractures-interval healing.  No acute osseous abnormality seen. 9/13>> CT L-spine: No acute osseous abnormality seen. 9/13>> CT chest: No acute traumatic injury.  Chronic right clavicle/scapula/bilateral rib fractures. 9/13>> CT abdomen/pelvis: No traumatic injury 9/13>> vitamin B12: 584 9/13>> vitamin B1: Pending 9/14>> MRI brain: No acute intracranial abnormality. 9/14>> SPEP/light chain: Pending 9/14>> HBsAg/anti-HCV Ab: Negative 9/14>> A1c 5.4 9/14>> TSH: 11.067.  Significant microbiology data: None  Procedures: None  Consults: None  Subjective: Confused-apparently was attempting to get out of bed-given IV Ativan earlier this morning.  Awake but very confused.  Tremulous.  Objective: Vitals: Blood pressure (!) 129/93, pulse 94, temperature 98.1 F (36.7 C), temperature source Oral, resp. rate 19, height 5\' 4"  (1.626 m), weight 53.6 kg, SpO2 100 %.   Exam: Gen Exam: Confused-not in any distress. HEENT:atraumatic, normocephalic Chest: B/L clear to auscultation anteriorly CVS:S1S2 regular Abdomen:soft non tender, non  distended Extremities:no edema Neurology: Difficult exam but appears to be non focal Skin: no rash   Pertinent Labs/Radiology:    Latest Ref Rng & Units 10/01/2021    6:52 AM 09/29/2021    2:58 AM 09/02/2021    2:30 AM  CBC  WBC 4.0 - 10.5 K/uL 10.3  11.7  7.7   Hemoglobin 13.0 - 17.0 g/dL 10.8  10.3  11.8   Hematocrit 39.0 - 52.0 % 30.9  29.0  32.9   Platelets 150 - 400 K/uL 148  141  101     Lab Results  Component Value Date   NA 133 (L) 10/02/2021   K 4.1 10/02/2021   CL 100 10/02/2021   CO2 24 10/02/2021      Assessment/Plan: Frequent falls/unsteady gait/peripheral neuropathy: Unclear whether this is Wernicke's encephalopathy or a 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  He has no motor weakness on my exam.  Work-up as above-unfortunately he now has alcohol withdrawal symptoms-unclear how much he is able to participate with PT/OT-once his withdrawal symptoms cleared somewhat-he will need more evaluation.  Continue high-dose IV thiamine.    EtOH withdrawal: Longstanding history of alcohol use-currently with withdrawal symptoms-confused/tachycardic and tremulous.  Continue Ativan per CIWA protocol.    Peripheral edema: Appears to be mild-likely due to hypoalbuminemia.  Recent echo with stable EF-no proteinuria on UA.  Hypomagnesemia: Due to alcohol use-replete.  Recheck tomorrow.  Hypokalemia: Due to alcohol use-repleted.  Normocytic anemia: No evidence of blood loss-probably due to alcohol use-bone marrow suppression.  But given neuropathy- SPEP/UPEP/light chains-ordered and pending.  Continue to follow CBC periodically.  Thrombocytopenia: Likely due to alcohol use-mild-follow periodically.  Hypothyroidism: TSH mildly elevated-Free T4 within normal  limits.  Suspect subclinical hypothyroidism.  Hold treatment for now.  BMI: Estimated body mass index is 20.28 kg/m as calculated from the following:   Height as of this  encounter: 5\' 4"  (1.626 m).   Weight as of this encounter: 53.6 kg.   Code status:   Code Status: Full Code   DVT Prophylaxis: enoxaparin (LOVENOX) injection 40 mg Start: 09/29/21 1000 SCDs Start: 09/29/21 3536   Family Communication: None at bedside   Disposition Plan: Status is: Inpatient Remains inpatient appropriate because: Alcohol withdrawal symptoms-not yet stable for discharge.   Planned Discharge Destination:Home likely next week.  Diet: Diet Order             Diet regular Room service appropriate? Yes; Fluid consistency: Thin  Diet effective now                     Antimicrobial agents: Anti-infectives (From admission, onward)    None        MEDICATIONS: Scheduled Meds:  enoxaparin (LOVENOX) injection  40 mg Subcutaneous R44R   folic acid  1 mg Oral Daily   LORazepam  0-4 mg Intravenous Q6H   Followed by   Derrill Memo ON 10/03/2021] LORazepam  0-4 mg Intravenous Q12H   multivitamin with minerals  1 tablet Oral Daily   Continuous Infusions:  thiamine (VITAMIN B1) injection 500 mg (10/02/21 0942)   PRN Meds:.acetaminophen **OR** acetaminophen, LORazepam **OR** LORazepam, ondansetron **OR** ondansetron (ZOFRAN) IV, senna-docusate   I have personally reviewed following labs and imaging studies  LABORATORY DATA: CBC: Recent Labs  Lab 09/29/21 0258 10/01/21 0652  WBC 11.7* 10.3  NEUTROABS 8.7*  --   HGB 10.3* 10.8*  HCT 29.0* 30.9*  MCV 98.3 98.4  PLT 141* 148*     Basic Metabolic Panel: Recent Labs  Lab 09/29/21 0258 09/30/21 0415 10/01/21 0652 10/02/21 0713  NA 135  --  136 133*  K 3.2* 3.6 3.6 4.1  CL 99  --  101 100  CO2 24  --  27 24  GLUCOSE 104*  --  111* 93  BUN <5*  --  7 8  CREATININE 0.52*  --  0.48* 0.44*  CALCIUM 8.6*  --  8.6* 8.8*  MG 1.4* 1.5* 1.5* 1.6*  PHOS 2.8  --  3.4 3.9     GFR: Estimated Creatinine Clearance: 84.7 mL/min (A) (by C-G formula based on SCr of 0.44 mg/dL (L)).  Liver Function  Tests: Recent Labs  Lab 09/29/21 0258 09/30/21 0415 10/01/21 0652  AST 80* 79* 61*  ALT 24 21 18   ALKPHOS 181* 205* 177*  BILITOT 0.6 1.2 1.0  PROT 6.4* 6.2* 6.3*  ALBUMIN 3.2* 2.9* 2.9*    Recent Labs  Lab 09/29/21 0258  LIPASE 54*    Recent Labs  Lab 09/29/21 0258  AMMONIA 53*     Coagulation Profile: Recent Labs  Lab 09/29/21 0258  INR 1.3*     Cardiac Enzymes: No results for input(s): "CKTOTAL", "CKMB", "CKMBINDEX", "TROPONINI" in the last 168 hours.  BNP (last 3 results) No results for input(s): "PROBNP" in the last 8760 hours.  Lipid Profile: No results for input(s): "CHOL", "HDL", "LDLCALC", "TRIG", "CHOLHDL", "LDLDIRECT" in the last 72 hours.  Thyroid Function Tests: Recent Labs    09/30/21 1203 10/02/21 0713  TSH 11.067*  --   FREET4  --  0.81     Anemia Panel: No results for input(s): "VITAMINB12", "FOLATE", "FERRITIN", "TIBC", "IRON", "RETICCTPCT" in the last 72 hours.  Urine analysis:    Component Value Date/Time   COLORURINE YELLOW 09/29/2021 0258   APPEARANCEUR CLEAR 09/29/2021 0258   LABSPEC 1.004 (L) 09/29/2021 0258   PHURINE 7.0 09/29/2021 0258   GLUCOSEU NEGATIVE 09/29/2021 0258   HGBUR NEGATIVE 09/29/2021 0258   BILIRUBINUR NEGATIVE 09/29/2021 0258   KETONESUR NEGATIVE 09/29/2021 0258   PROTEINUR NEGATIVE 09/29/2021 0258   NITRITE NEGATIVE 09/29/2021 0258   LEUKOCYTESUR NEGATIVE 09/29/2021 0258    Sepsis Labs: Lactic Acid, Venous    Component Value Date/Time   LATICACIDVEN 2.7 (HH) 09/29/2021 1753    MICROBIOLOGY: No results found for this or any previous visit (from the past 240 hour(s)).  RADIOLOGY STUDIES/RESULTS: No results found.   LOS: 3 days   Samuel Binet, MD  [facility]    To contact the attending provider between 7A-7P or the covering provider during after hours 7P-7A, please log into the web site [email or web address] and access using universal [place] password for that web site. If you  do not have the password, please call the hospital operator.  10/02/2021, 11:08 AM

## 2021-10-03 LAB — BASIC METABOLIC PANEL
Anion gap: 11 (ref 5–15)
BUN: 9 mg/dL (ref 6–20)
CO2: 21 mmol/L — ABNORMAL LOW (ref 22–32)
Calcium: 8.9 mg/dL (ref 8.9–10.3)
Chloride: 101 mmol/L (ref 98–111)
Creatinine, Ser: 0.5 mg/dL — ABNORMAL LOW (ref 0.61–1.24)
GFR, Estimated: >60 mL/min (ref >60)
Glucose, Bld: 93 mg/dL (ref 70–99)
Potassium: 4.1 mmol/L (ref 3.5–5.1)
Sodium: 133 mmol/L — ABNORMAL LOW (ref 135–145)

## 2021-10-03 LAB — MAGNESIUM: Magnesium: 1.6 mg/dL — ABNORMAL LOW (ref 1.7–2.4)

## 2021-10-03 LAB — PHOSPHORUS: Phosphorus: 4.2 mg/dL (ref 2.5–4.6)

## 2021-10-03 MED ORDER — MAGNESIUM SULFATE 4 GM/100ML IV SOLN
4.0000 g | Freq: Once | INTRAVENOUS | Status: AC
  Administered 2021-10-03: 4 g via INTRAVENOUS
  Filled 2021-10-03: qty 100

## 2021-10-03 NOTE — Progress Notes (Signed)
PROGRESS NOTE        PATIENT DETAILS Name: [name] Age: 51 y.o. Sex: male Date of Birth: 06-09-1970 Admit Date: 09/29/2021 Admitting Physician Emeline General, MD [id number], No Pcp Per  Brief Summary: Patient is a 51 y.o.  male with longstanding history of EtOH use-presented with approximately 1 week history of tingling/numbness involving all 4 extremities and frequent falls.  He was admitted to the hospitalist service-started on a high-dose IV thiamine-unfortunately hospital course complicated by alcohol withdrawal symptoms.  Significant events: 9/13>> admit to [facility]-frequent falls/tingling/numbness to all 4 extremities  Significant studies: 9/13>> CT head: No acute intracranial abnormalities 9/13>> CT C-spine: No fracture/dislocation 9/13>> CT T-spine: Sequelae of T7 compression and T10 Chance fractures-interval healing.  No acute osseous abnormality seen. 9/13>> CT L-spine: No acute osseous abnormality seen. 9/13>> CT chest: No acute traumatic injury.  Chronic right clavicle/scapula/bilateral rib fractures. 9/13>> CT abdomen/pelvis: No traumatic injury 9/13>> vitamin B12: 584 9/13>> vitamin B1: Pending 9/14>> MRI brain: No acute intracranial abnormality. 9/14>> SPEP/light chain: Pending 9/14>> HBsAg/anti-HCV Ab: Negative 9/14>> ANA positive, dsDNA slightly elevated at 13. 9/14>> A1c 5.4 9/14>> TSH: 11.067. 9/16>> FT4: 0.81 (normal)  Significant microbiology data: None  Procedures: None  Consults: None  Subjective: Sleepy-still confused but somewhat better than yesterday.  Appears to be tremulous.  Objective: Vitals: Blood pressure 114/89, pulse 96, temperature 98.6 F (37 C), temperature source Oral, resp. rate 12, height 5\' 4"  (1.626 m), weight 53.6 kg, SpO2 96 %.   Exam: Gen Exam:Alert awake-not in any distress HEENT:atraumatic, normocephalic Chest: B/L clear to auscultation anteriorly CVS:S1S2  regular Abdomen:soft non tender, non distended Extremities:no edema Neurology: Non focal Skin: no rash   Pertinent Labs/Radiology:    Latest Ref Rng & Units 10/01/2021    6:52 AM 09/29/2021    2:58 AM 09/02/2021    2:30 AM  CBC  WBC 4.0 - 10.5 K/uL 10.3  11.7  7.7   Hemoglobin 13.0 - 17.0 g/dL 09/04/2021  85.2  77.8   Hematocrit 39.0 - 52.0 % 30.9  29.0  32.9   Platelets 150 - 400 K/uL 148  141  101     Lab Results  Component Value Date   NA 133 (L) 10/03/2021   K 4.1 10/03/2021   CL 101 10/03/2021   CO2 21 (L) 10/03/2021      Assessment/Plan: Frequent falls/unsteady gait/peripheral neuropathy: Unclear whether this is Wernicke's encephalopathy or a 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  He has no motor weakness on my exam.  Work-up as above-unfortunately he now has alcohol withdrawal symptoms-unclear how much he is able to participate with PT/OT-once his withdrawal symptoms cleared somewhat-he will need more evaluation.  Continue high-dose IV thiamine.    EtOH withdrawal: Longstanding history of alcohol use-withdrawal symptoms somewhat better today-continue Ativan per CIWA protocol.  Continue to mobilize with PT.    Peripheral edema: Appears to be mild-likely due to hypoalbuminemia.  Recent echo with stable EF-no proteinuria on UA.  Hypomagnesemia: Continue to replete-this is due to alcohol use.  Hypokalemia: Due to alcohol use-repleted.  Normocytic anemia: No evidence of blood loss-probably due to alcohol use-bone marrow suppression.  But given neuropathy- SPEP/UPEP/light chains-ordered and pending.  Continue to follow CBC periodically.  Thrombocytopenia: Likely due to alcohol use-mild-follow periodically.  Hypothyroidism: TSH mildly elevated-Free T4  within normal limits.  Suspect subclinical hypothyroidism.  Hold treatment for now.  BMI: Estimated body mass index is 20.28 kg/m as calculated from the following:   Height as  of this encounter: 5\' 4"  (1.626 m).   Weight as of this encounter: 53.6 kg.   Code status:   Code Status: Full Code   DVT Prophylaxis: enoxaparin (LOVENOX) injection 40 mg Start: 09/29/21 1000 SCDs Start: 09/29/21 10/01/21   Family Communication: None at bedside   Disposition Plan: Status is: Inpatient Remains inpatient appropriate because: Alcohol withdrawal symptoms-not yet stable for discharge.   Planned Discharge Destination:Home likely next week.  Diet: Diet Order             Diet regular Room service appropriate? Yes; Fluid consistency: Thin  Diet effective now                     Antimicrobial agents: Anti-infectives (From admission, onward)    None        MEDICATIONS: Scheduled Meds:  enoxaparin (LOVENOX) injection  40 mg Subcutaneous Q24H   folic acid  1 mg Oral Daily   LORazepam  0-4 mg Intravenous Q6H   Followed by   LORazepam  0-4 mg Intravenous Q12H   multivitamin with minerals  1 tablet Oral Daily   Continuous Infusions:  thiamine (VITAMIN B1) injection 500 mg (10/03/21 0930)   PRN Meds:.acetaminophen **OR** acetaminophen, LORazepam **OR** LORazepam, ondansetron **OR** ondansetron (ZOFRAN) IV, senna-docusate   I have personally reviewed following labs and imaging studies  LABORATORY DATA: CBC: Recent Labs  Lab 09/29/21 0258 10/01/21 0652  WBC 11.7* 10.3  NEUTROABS 8.7*  --   HGB 10.3* 10.8*  HCT 29.0* 30.9*  MCV 98.3 98.4  PLT 141* 148*     Basic Metabolic Panel: Recent Labs  Lab 09/29/21 0258 09/30/21 0415 10/01/21 0652 10/02/21 0713 10/03/21 0703  NA 135  --  136 133* 133*  K 3.2* 3.6 3.6 4.1 4.1  CL 99  --  101 100 101  CO2 24  --  27 24 21*  GLUCOSE 104*  --  111* 93 93  BUN <5*  --  7 8 9   CREATININE 0.52*  --  0.48* 0.44* 0.50*  CALCIUM 8.6*  --  8.6* 8.8* 8.9  MG 1.4* 1.5* 1.5* 1.6* 1.6*  PHOS 2.8  --  3.4 3.9 4.2     GFR: Estimated Creatinine Clearance: 84.7 mL/min (A) (by C-G formula based on SCr of 0.5  mg/dL (L)).  Liver Function Tests: Recent Labs  Lab 09/29/21 0258 09/30/21 0415 10/01/21 0652  AST 80* 79* 61*  ALT 24 21 18   ALKPHOS 181* 205* 177*  BILITOT 0.6 1.2 1.0  PROT 6.4* 6.2* 6.3*  ALBUMIN 3.2* 2.9* 2.9*    Recent Labs  Lab 09/29/21 0258  LIPASE 54*    Recent Labs  Lab 09/29/21 0258  AMMONIA 53*     Coagulation Profile: Recent Labs  Lab 09/29/21 0258  INR 1.3*     Cardiac Enzymes: No results for input(s): "CKTOTAL", "CKMB", "CKMBINDEX", "TROPONINI" in the last 168 hours.  BNP (last 3 results) No results for input(s): "PROBNP" in the last 8760 hours.  Lipid Profile: No results for input(s): "CHOL", "HDL", "LDLCALC", "TRIG", "CHOLHDL", "LDLDIRECT" in the last 72 hours.  Thyroid Function Tests: Recent Labs    09/30/21 1203 10/02/21 0713  TSH 11.067*  --   FREET4  --  0.81     Anemia Panel: No results for input(s): "VITAMINB12", "  FOLATE", "FERRITIN", "TIBC", "IRON", "RETICCTPCT" in the last 72 hours.   Urine analysis:    Component Value Date/Time   COLORURINE YELLOW 09/29/2021 0258   APPEARANCEUR CLEAR 09/29/2021 0258   LABSPEC 1.004 (L) 09/29/2021 0258   PHURINE 7.0 09/29/2021 0258   GLUCOSEU NEGATIVE 09/29/2021 0258   HGBUR NEGATIVE 09/29/2021 0258   BILIRUBINUR NEGATIVE 09/29/2021 0258   KETONESUR NEGATIVE 09/29/2021 0258   PROTEINUR NEGATIVE 09/29/2021 0258   NITRITE NEGATIVE 09/29/2021 0258   LEUKOCYTESUR NEGATIVE 09/29/2021 0258    Sepsis Labs: Lactic Acid, Venous    Component Value Date/Time   LATICACIDVEN 2.7 (HH) 09/29/2021 1753    MICROBIOLOGY: No results found for this or any previous visit (from the past 240 hour(s)).  RADIOLOGY STUDIES/RESULTS: No results found.   LOS: 4 days   Oren Binet, MD  [facility]    To contact the attending provider between 7A-7P or the covering provider during after hours 7P-7A, please log into the web site [email or web address] and access using universal [place]  password for that web site. If you do not have the password, please call the hospital operator.  10/03/2021, 10:30 AM

## 2021-10-04 DIAGNOSIS — G9340 Encephalopathy, unspecified: Secondary | ICD-10-CM

## 2021-10-04 LAB — PROTEIN ELECTROPHORESIS, SERUM
A/G Ratio: 1.1 (ref 0.7–1.7)
Albumin ELP: 3 g/dL (ref 2.9–4.4)
Alpha-1-Globulin: 0.3 g/dL (ref 0.0–0.4)
Alpha-2-Globulin: 0.5 g/dL (ref 0.4–1.0)
Beta Globulin: 0.9 g/dL (ref 0.7–1.3)
Gamma Globulin: 1.1 g/dL (ref 0.4–1.8)
Globulin, Total: 2.8 g/dL (ref 2.2–3.9)
Total Protein ELP: 5.8 g/dL — ABNORMAL LOW (ref 6.0–8.5)

## 2021-10-04 LAB — COMPREHENSIVE METABOLIC PANEL
ALT: 28 U/L (ref 0–44)
AST: 126 U/L — ABNORMAL HIGH (ref 15–41)
Albumin: 2.8 g/dL — ABNORMAL LOW (ref 3.5–5.0)
Alkaline Phosphatase: 173 U/L — ABNORMAL HIGH (ref 38–126)
Anion gap: 7 (ref 5–15)
BUN: 10 mg/dL (ref 6–20)
CO2: 25 mmol/L (ref 22–32)
Calcium: 8.8 mg/dL — ABNORMAL LOW (ref 8.9–10.3)
Chloride: 104 mmol/L (ref 98–111)
Creatinine, Ser: 0.47 mg/dL — ABNORMAL LOW (ref 0.61–1.24)
GFR, Estimated: >60 mL/min (ref >60)
Glucose, Bld: 93 mg/dL (ref 70–99)
Potassium: 3.9 mmol/L (ref 3.5–5.1)
Sodium: 136 mmol/L (ref 135–145)
Total Bilirubin: 0.9 mg/dL (ref 0.3–1.2)
Total Protein: 6.3 g/dL — ABNORMAL LOW (ref 6.5–8.1)

## 2021-10-04 LAB — MAGNESIUM: Magnesium: 1.6 mg/dL — ABNORMAL LOW (ref 1.7–2.4)

## 2021-10-04 MED ORDER — THIAMINE MONONITRATE 100 MG PO TABS
100.0000 mg | ORAL_TABLET | Freq: Every day | ORAL | Status: DC
  Administered 2021-10-05 – 2021-10-20 (×16): 100 mg via ORAL
  Filled 2021-10-04 (×15): qty 1

## 2021-10-04 MED ORDER — THIAMINE HCL 100 MG/ML IJ SOLN
100.0000 mg | Freq: Every day | INTRAMUSCULAR | Status: DC
  Administered 2021-10-04: 100 mg via INTRAVENOUS
  Filled 2021-10-04: qty 2

## 2021-10-04 MED ORDER — MAGNESIUM SULFATE 4 GM/100ML IV SOLN
4.0000 g | Freq: Once | INTRAVENOUS | Status: AC
  Administered 2021-10-04: 4 g via INTRAVENOUS
  Filled 2021-10-04: qty 100

## 2021-10-04 NOTE — Progress Notes (Signed)
PROGRESS NOTE        PATIENT DETAILS Name: Samuel [name] Age: 51 y.o. Sex: male Date of Birth: [birth year] Admit Date: 09/29/2021 Admitting Physician Lequita Halt, MD [id number], No Pcp Per  Brief Summary: Patient is a 51 y.o.  male with longstanding history of EtOH use-presented with approximately 1 week history of tingling/numbness involving all 4 extremities and frequent falls.  He was admitted to the hospitalist service-started on a high-dose IV thiamine-unfortunately hospital course complicated by alcohol withdrawal symptoms.  Significant events: 9/13>> admit to [facility]-frequent falls/tingling/numbness to all 4 extremities  Significant studies: 9/13>> CT head: No acute intracranial abnormalities 9/13>> CT C-spine: No fracture/dislocation 9/13>> CT T-spine: Sequelae of T7 compression and T10 Chance fractures-interval healing.  No acute osseous abnormality seen. 9/13>> CT L-spine: No acute osseous abnormality seen. 9/13>> CT chest: No acute traumatic injury.  Chronic right clavicle/scapula/bilateral rib fractures. 9/13>> CT abdomen/pelvis: No traumatic injury 9/13>> vitamin B12: 584 9/13>> vitamin B1: Pending 9/14>> MRI brain: No acute intracranial abnormality. 9/14>> SPEP/light chain: Pending 9/14>> HBsAg/anti-HCV Ab: Negative 9/14>> ANA positive, dsDNA slightly elevated at 13. 9/14>> A1c 5.4 9/14>> TSH: 11.067. 9/16>> FT4: 0.81 (normal)  Significant microbiology data: None  Procedures: None  Consults: None  Subjective: Much more awake and alert.  Continues to claim that he still has some numbness/tingling in his lower extremities.  Objective: Vitals: Blood pressure 123/84, pulse (!) 107, temperature 98.3 F (36.8 C), temperature source Oral, resp. rate 15, height 5\' 4"  (1.626 m), weight 53.6 kg, SpO2 96 %.   Exam: Gen Exam:Alert awake-not in any distress HEENT:atraumatic, normocephalic Chest: B/L clear to auscultation  anteriorly CVS:S1S2 regular Abdomen:soft non tender, non distended Extremities:no edema Neurology: Non focal Skin: no rash   Pertinent Labs/Radiology:    Latest Ref Rng & Units 10/01/2021    6:52 AM 09/29/2021    2:58 AM 09/02/2021    2:30 AM  CBC  WBC 4.0 - 10.5 K/uL 10.3  11.7  7.7   Hemoglobin 13.0 - 17.0 g/dL 10.8  10.3  11.8   Hematocrit 39.0 - 52.0 % 30.9  29.0  32.9   Platelets 150 - 400 K/uL 148  141  101     Lab Results  Component Value Date   NA 136 10/04/2021   K 3.9 10/04/2021   CL 104 10/04/2021   CO2 25 10/04/2021      Assessment/Plan: Frequent falls/unsteady gait/peripheral neuropathy: Unclear whether this is Wernicke's encephalopathy or a 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  He has no motor weakness on my exam.  Work-up as above-thankfully he is much more awake and alert today-await reevaluation by physical therapy.  He has completed several days of high-dose IV thiamine-and will see if his gait and other issues issues have improved.  If not-May need to touch base with neurology.  EtOH withdrawal: Longstanding history of alcohol use-withdrawal symptoms significantly better today-continue Ativan per CIWA protocol.  Counseled extensively this morning.  Hypomagnesemia: Continue to replete-this is due to alcohol use.  Hypokalemia: Due to alcohol use-repleted.  Normocytic anemia: No evidence of blood loss-probably due to alcohol use-bone marrow suppression.  But given neuropathy- SPEP/UPEP/light chains-ordered and pending.  Continue to follow CBC periodically.  Thrombocytopenia: Likely due to alcohol use-mild-follow periodically.  Hypothyroidism: TSH mildly elevated-Free T4 within normal limits.  Suspect  subclinical hypothyroidism.  Hold treatment for now.  BMI: Estimated body mass index is 20.28 kg/m as calculated from the following:   Height as of this encounter: 5\' 4"  (1.626 m).   Weight as of this  encounter: 53.6 kg.   Code status:   Code Status: Full Code   DVT Prophylaxis: enoxaparin (LOVENOX) injection 40 mg Start: 09/29/21 1000 SCDs Start: 09/29/21 10/01/21   Family Communication: None at bedside   Disposition Plan: Status is: Inpatient Remains inpatient appropriate because: Alcohol withdrawal symptoms-not yet stable for discharge.   Planned Discharge Destination:Home likely in the next few days.  Diet: Diet Order             Diet regular Room service appropriate? Yes; Fluid consistency: Thin  Diet effective now                     Antimicrobial agents: Anti-infectives (From admission, onward)    None        MEDICATIONS: Scheduled Meds:  enoxaparin (LOVENOX) injection  40 mg Subcutaneous Q24H   folic acid  1 mg Oral Daily   LORazepam  0-4 mg Intravenous Q12H   multivitamin with minerals  1 tablet Oral Daily   [START ON 10/05/2021] thiamine  100 mg Oral Daily   Continuous Infusions:   PRN Meds:.acetaminophen **OR** acetaminophen, LORazepam **OR** LORazepam, ondansetron **OR** ondansetron (ZOFRAN) IV, senna-docusate   I have personally reviewed following labs and imaging studies  LABORATORY DATA: CBC: Recent Labs  Lab 09/29/21 0258 10/01/21 0652  WBC 11.7* 10.3  NEUTROABS 8.7*  --   HGB 10.3* 10.8*  HCT 29.0* 30.9*  MCV 98.3 98.4  PLT 141* 148*     Basic Metabolic Panel: Recent Labs  Lab 09/29/21 0258 09/30/21 0415 10/01/21 0652 10/02/21 0713 10/03/21 0703 10/04/21 0421  NA 135  --  136 133* 133* 136  K 3.2* 3.6 3.6 4.1 4.1 3.9  CL 99  --  101 100 101 104  CO2 24  --  27 24 21* 25  GLUCOSE 104*  --  111* 93 93 93  BUN <5*  --  7 8 9 10   CREATININE 0.52*  --  0.48* 0.44* 0.50* 0.47*  CALCIUM 8.6*  --  8.6* 8.8* 8.9 8.8*  MG 1.4* 1.5* 1.5* 1.6* 1.6* 1.6*  PHOS 2.8  --  3.4 3.9 4.2  --      GFR: Estimated Creatinine Clearance: 84.7 mL/min (A) (by C-G formula based on SCr of 0.47 mg/dL (L)).  Liver Function  Tests: Recent Labs  Lab 09/29/21 0258 09/30/21 0415 10/01/21 0652 10/04/21 0421  AST 80* 79* 61* 126*  ALT 24 21 18 28   ALKPHOS 181* 205* 177* 173*  BILITOT 0.6 1.2 1.0 0.9  PROT 6.4* 6.2* 6.3* 6.3*  ALBUMIN 3.2* 2.9* 2.9* 2.8*    Recent Labs  Lab 09/29/21 0258  LIPASE 54*    Recent Labs  Lab 09/29/21 0258  AMMONIA 53*     Coagulation Profile: Recent Labs  Lab 09/29/21 0258  INR 1.3*     Cardiac Enzymes: No results for input(s): "CKTOTAL", "CKMB", "CKMBINDEX", "TROPONINI" in the last 168 hours.  BNP (last 3 results) No results for input(s): "PROBNP" in the last 8760 hours.  Lipid Profile: No results for input(s): "CHOL", "HDL", "LDLCALC", "TRIG", "CHOLHDL", "LDLDIRECT" in the last 72 hours.  Thyroid Function Tests: Recent Labs    10/02/21 0713  FREET4 0.81     Anemia Panel: No results for input(s): "VITAMINB12", "FOLATE", "  FERRITIN", "TIBC", "IRON", "RETICCTPCT" in the last 72 hours.   Urine analysis:    Component Value Date/Time   COLORURINE YELLOW 09/29/2021 0258   APPEARANCEUR CLEAR 09/29/2021 0258   LABSPEC 1.004 (L) 09/29/2021 0258   PHURINE 7.0 09/29/2021 0258   GLUCOSEU NEGATIVE 09/29/2021 0258   HGBUR NEGATIVE 09/29/2021 0258   BILIRUBINUR NEGATIVE 09/29/2021 0258   KETONESUR NEGATIVE 09/29/2021 0258   PROTEINUR NEGATIVE 09/29/2021 0258   NITRITE NEGATIVE 09/29/2021 0258   LEUKOCYTESUR NEGATIVE 09/29/2021 0258    Sepsis Labs: Lactic Acid, Venous    Component Value Date/Time   LATICACIDVEN 2.7 (HH) 09/29/2021 1753    MICROBIOLOGY: No results found for this or any previous visit (from the past 240 hour(s)).  RADIOLOGY STUDIES/RESULTS: No results found.   LOS: 5 days   Jeoffrey Massed, MD  [facility]    To contact the attending provider between 7A-7P or the covering provider during after hours 7P-7A, please log into the web site [email or web address] and access using universal [place] password for that web site. If  you do not have the password, please call the hospital operator.  10/04/2021, 12:04 PM

## 2021-10-05 LAB — COMPREHENSIVE METABOLIC PANEL
ALT: 27 U/L (ref 0–44)
AST: 102 U/L — ABNORMAL HIGH (ref 15–41)
Albumin: 2.8 g/dL — ABNORMAL LOW (ref 3.5–5.0)
Alkaline Phosphatase: 165 U/L — ABNORMAL HIGH (ref 38–126)
Anion gap: 10 (ref 5–15)
BUN: 8 mg/dL (ref 6–20)
CO2: 22 mmol/L (ref 22–32)
Calcium: 8.6 mg/dL — ABNORMAL LOW (ref 8.9–10.3)
Chloride: 102 mmol/L (ref 98–111)
Creatinine, Ser: 0.46 mg/dL — ABNORMAL LOW (ref 0.61–1.24)
GFR, Estimated: >60 mL/min (ref >60)
Glucose, Bld: 92 mg/dL (ref 70–99)
Potassium: 3.6 mmol/L (ref 3.5–5.1)
Sodium: 134 mmol/L — ABNORMAL LOW (ref 135–145)
Total Bilirubin: 1 mg/dL (ref 0.3–1.2)
Total Protein: 6.3 g/dL — ABNORMAL LOW (ref 6.5–8.1)

## 2021-10-05 LAB — VITAMIN B1: Vitamin B1 (Thiamine): 33.9 nmol/L — ABNORMAL LOW (ref 66.5–200.0)

## 2021-10-05 LAB — MAGNESIUM: Magnesium: 1.4 mg/dL — ABNORMAL LOW (ref 1.7–2.4)

## 2021-10-05 MED ORDER — MAGNESIUM SULFATE 4 GM/100ML IV SOLN
4.0000 g | Freq: Two times a day (BID) | INTRAVENOUS | Status: AC
  Administered 2021-10-05 (×2): 4 g via INTRAVENOUS
  Filled 2021-10-05 (×2): qty 100

## 2021-10-05 MED ORDER — POTASSIUM CHLORIDE CRYS ER 20 MEQ PO TBCR
40.0000 meq | EXTENDED_RELEASE_TABLET | Freq: Once | ORAL | Status: AC
  Administered 2021-10-05: 40 meq via ORAL
  Filled 2021-10-05: qty 2

## 2021-10-05 MED ORDER — LEVOTHYROXINE SODIUM 25 MCG PO TABS
25.0000 ug | ORAL_TABLET | Freq: Every day | ORAL | Status: DC
  Administered 2021-10-05 – 2021-10-15 (×11): 25 ug via ORAL
  Filled 2021-10-05 (×12): qty 1

## 2021-10-05 NOTE — Plan of Care (Signed)

## 2021-10-05 NOTE — TOC Initial Note (Addendum)
Transition of Care [facility]) - Initial/Assessment Note    Patient Details  Name: Samuel [name] MRN: [id number] Date of Birth: [birth year]  Transition of Care [facility]) CM/SW Contact:    Carles Collet, RN Phone Number: 10/05/2021, 12:31 PM  Clinical Narrative:                 Damaris Schooner w patient at bedside with CSW and interprter. Provided with ETOH resources. Spoke with friend [name] and she confirmed address on facesheet and that she will provide transportaton home.  Referral made to [place], charity [facility] provider. Referral declined. [place] added to AVS, patient will need to call and make appointment per office.  No DME needs.  MATCH entered into system. Please send scripts through [facility] at DC. Flores-Martinez,Margoht Friend   [phone]    Flores-Martinez,Margoht Friend   [phone]    Expected Discharge Plan: Home/Self Care Barriers to Discharge: Continued Medical Work up   Patient Goals and CMS Choice Patient states their goals for this hospitalization and ongoing recovery are:: to go home CMS Medicare.gov Compare Post Acute Care list provided to:: Other (Comment Required) (charity)    Expected Discharge Plan and Services Expected Discharge Plan: Home/Self Care In-house Referral: Clinical Social Work Discharge Planning Services: CM Consult   Living arrangements for the past 2 months: [place]: [place] (charity) Date [place]: 10/05/21 Time [place]: 4010 Representative spoke with at [place]: [place] Arrangements/Services Living arrangements for the past 2 months: [place] with:: Friends Patient language and need for interpreter reviewed:: Yes (Spanish Speaking) Do you feel safe going back to the place where you live?: Yes      Need for Family Participation in Patient Care: Yes (Comment) Care giver support system in place?: No  (comment)   Criminal Activity/Legal Involvement Pertinent to Current Situation/Hospitalization: No - Comment as needed  Activities of Daily Living      Permission Sought/Granted                  Emotional Assessment       Orientation: : Fluctuating Orientation (Suspected and/or reported Sundowners) Alcohol / Substance Use: Alcohol Use Psych Involvement: No (comment)  Admission diagnosis:  Weakness [R53.1] Encephalopathy [G93.40] Generalized weakness [R53.1] Patient Active Problem List   Diagnosis Date Noted   Generalized weakness 09/29/2021   Wernicke's encephalopathy 09/29/2021   Encephalopathy 09/29/2021   Acute encephalopathy 09/09/2020   Electrolyte imbalance 09/09/2020   Fall 07/02/2020   Pancreatitis 06/17/2018   PCP:  Patient, No Pcp Per Pharmacy:   [facility] [place], [place] [address] [address] Phone: [phone] Fax: [phone]     Social Determinants of Health (SDOH) Interventions    Readmission Risk Interventions    07/21/2020    2:26 PM  Readmission Risk Prevention Plan  Medication Screening Complete  Transportation Screening Complete

## 2021-10-05 NOTE — Progress Notes (Addendum)
PROGRESS NOTE        PATIENT DETAILS Name: Samuel [name] [facility] [name] Age: 51 y.o. Sex: male Date of Birth: Nov 06, 1970 Admit Date: 09/29/2021 Admitting Physician Emeline General, MD WUJ:[id number], No Pcp Per  Brief Summary: Patient is a 51 y.o.  male with longstanding history of EtOH use-presented with approximately 1 week history of tingling/numbness involving all 4 extremities and frequent falls.  He was admitted to the hospitalist service-started on a high-dose IV thiamine-unfortunately hospital course complicated by alcohol withdrawal symptoms.  Significant events: 9/13>> admit to [facility]-frequent falls/tingling/numbness to all 4 extremities  Significant studies: 9/13>> CT head: No acute intracranial abnormalities 9/13>> CT C-spine: No fracture/dislocation 9/13>> CT T-spine: Sequelae of T7 compression and T10 Chance fractures-interval healing.  No acute osseous abnormality seen. 9/13>> CT L-spine: No acute osseous abnormality seen. 9/13>> CT chest: No acute traumatic injury.  Chronic right clavicle/scapula/bilateral rib fractures. 9/13>> CT abdomen/pelvis: No traumatic injury 9/13>> vitamin B12: 584 9/13>> vitamin B1: Pending 9/14>> MRI brain: No acute intracranial abnormality. 9/14>> SPEP/light chain: No M spike 9/14>> HBsAg/anti-HCV Ab: Negative 9/14>> ANA positive, dsDNA slightly elevated at 13. 9/14>> A1c 5.4 9/14>> TSH: 11.067. 9/16>> FT4: 0.81 (normal)  Significant microbiology data: None  Procedures: None  Consults: None  Subjective: Completely awake/alert-ambulated with charge RN today-still appears ataxic.  Objective: Vitals: Blood pressure 128/83, pulse (!) 109, temperature 98.3 F (36.8 C), temperature source Oral, resp. rate 15, height 5\' 4"  (1.626 m), weight 53.6 kg, SpO2 96 %.   Exam: Gen Exam:Alert awake-not in any distress HEENT:atraumatic, normocephalic Chest: B/L clear to auscultation anteriorly CVS:S1S2  regular Abdomen:soft non tender, non distended Extremities:no edema Neurology: Non focal Skin: no rash   Pertinent Labs/Radiology:    Latest Ref Rng & Units 10/01/2021    6:52 AM 09/29/2021    2:58 AM 09/02/2021    2:30 AM  CBC  WBC 4.0 - 10.5 K/uL 10.3  11.7  7.7   Hemoglobin 13.0 - 17.0 g/dL 09/04/2021  82.9  56.2   Hematocrit 39.0 - 52.0 % 30.9  29.0  32.9   Platelets 150 - 400 K/uL 148  141  101     Lab Results  Component Value Date   NA 134 (L) 10/05/2021   K 3.6 10/05/2021   CL 102 10/05/2021   CO2 22 10/05/2021      Assessment/Plan: Frequent falls/unsteady gait/peripheral neuropathy: Continues to have ataxic gait in spite of 5 days of high-dose IV thiamine-work-up as above-suspect may have 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  Have consulted neurology today to see if further work-up is required.   EtOH withdrawal: Longstanding history of alcohol use-withdrawal symptoms significantly better today-continue Ativan per CIWA protocol.  Counseled extensively this morning.  Hypomagnesemia: Continues to have persistent hypomagnesemia-continue to replete.  Suspect this is due to significantly deficient stores in the setting of EtOH use.  Hypokalemia: Due to alcohol use-repleted.  Normocytic anemia: No evidence of blood loss-probably due to alcohol use-bone marrow suppression.  But given neuropathy- SPEP/UPEP/light chains-ordered and pending.  Continue to follow CBC periodically.  Thrombocytopenia: Likely due to alcohol use-mild-follow periodically.  Hypothyroidism: Appears to be mild-starting low-dose Synthroid to see if this will help his neuropathy.  ?  Lupus: Unclear significance of slightly elevated dsDNA titers-not sure if this has any role in his neuropathic symptoms.  Will need outpatient follow-up with  rheumatology.  BMI: Estimated body mass index is 20.28 kg/m as calculated from the following:   Height as of this encounter: 5\' 4"  (1.626 m).   Weight as of this  encounter: 53.6 kg.   Code status:   Code Status: Full Code   DVT Prophylaxis: enoxaparin (LOVENOX) injection 40 mg Start: 09/29/21 1000 SCDs Start: 09/29/21 10/01/21   Family Communication: None at bedside   Disposition Plan: Status is: Inpatient Remains inpatient appropriate because: Alcohol withdrawal symptoms-not yet stable for discharge.   Planned Discharge Destination:Home likely in the next few days.  Diet: Diet Order             Diet regular Room service appropriate? Yes; Fluid consistency: Thin  Diet effective now                     Antimicrobial agents: Anti-infectives (From admission, onward)    None        MEDICATIONS: Scheduled Meds:  enoxaparin (LOVENOX) injection  40 mg Subcutaneous Q24H   folic acid  1 mg Oral Daily   levothyroxine  25 mcg Oral Q0600   LORazepam  0-4 mg Intravenous Q12H   multivitamin with minerals  1 tablet Oral Daily   thiamine  100 mg Oral Daily   Continuous Infusions:   PRN Meds:.acetaminophen **OR** acetaminophen, ondansetron **OR** ondansetron (ZOFRAN) IV, senna-docusate   I have personally reviewed following labs and imaging studies  LABORATORY DATA: CBC: Recent Labs  Lab 09/29/21 0258 10/01/21 0652  WBC 11.7* 10.3  NEUTROABS 8.7*  --   HGB 10.3* 10.8*  HCT 29.0* 30.9*  MCV 98.3 98.4  PLT 141* 148*     Basic Metabolic Panel: Recent Labs  Lab 09/29/21 0258 09/30/21 0415 10/01/21 0652 10/02/21 0713 10/03/21 0703 10/04/21 0421 10/05/21 0739  NA 135  --  136 133* 133* 136 134*  K 3.2*   < > 3.6 4.1 4.1 3.9 3.6  CL 99  --  101 100 101 104 102  CO2 24  --  27 24 21* 25 22  GLUCOSE 104*  --  111* 93 93 93 92  BUN <5*  --  7 8 9 10 8   CREATININE 0.52*  --  0.48* 0.44* 0.50* 0.47* 0.46*  CALCIUM 8.6*  --  8.6* 8.8* 8.9 8.8* 8.6*  MG 1.4*   < > 1.5* 1.6* 1.6* 1.6* 1.4*  PHOS 2.8  --  3.4 3.9 4.2  --   --    < > = values in this interval not displayed.     GFR: Estimated Creatinine Clearance:  84.7 mL/min (A) (by C-G formula based on SCr of 0.46 mg/dL (L)).  Liver Function Tests: Recent Labs  Lab 09/29/21 0258 09/30/21 0415 10/01/21 0652 10/04/21 0421 10/05/21 0739  AST 80* 79* 61* 126* 102*  ALT 24 21 18 28 27   ALKPHOS 181* 205* 177* 173* 165*  BILITOT 0.6 1.2 1.0 0.9 1.0  PROT 6.4* 6.2* 6.3* 6.3* 6.3*  ALBUMIN 3.2* 2.9* 2.9* 2.8* 2.8*    Recent Labs  Lab 09/29/21 0258  LIPASE 54*    Recent Labs  Lab 09/29/21 0258  AMMONIA 53*     Coagulation Profile: Recent Labs  Lab 09/29/21 0258  INR 1.3*     Cardiac Enzymes: No results for input(s): "CKTOTAL", "CKMB", "CKMBINDEX", "TROPONINI" in the last 168 hours.  BNP (last 3 results) No results for input(s): "PROBNP" in the last 8760 hours.  Lipid Profile: No results for input(s): "CHOL", "HDL", "  [place]", "TRIG", "CHOLHDL", "LDLDIRECT" in the last 72 hours.  Thyroid Function Tests: No results for input(s): "TSH", "T4TOTAL", "FREET4", "T3FREE", "THYROIDAB" in the last 72 hours.   Anemia Panel: No results for input(s): "VITAMINB12", "FOLATE", "FERRITIN", "TIBC", "IRON", "RETICCTPCT" in the last 72 hours.   Urine analysis:    Component Value Date/Time   COLORURINE YELLOW 09/29/2021 0258   APPEARANCEUR CLEAR 09/29/2021 0258   LABSPEC 1.004 (L) 09/29/2021 0258   PHURINE 7.0 09/29/2021 0258   GLUCOSEU NEGATIVE 09/29/2021 0258   HGBUR NEGATIVE 09/29/2021 0258   BILIRUBINUR NEGATIVE 09/29/2021 0258   KETONESUR NEGATIVE 09/29/2021 0258   PROTEINUR NEGATIVE 09/29/2021 0258   NITRITE NEGATIVE 09/29/2021 0258   LEUKOCYTESUR NEGATIVE 09/29/2021 0258    Sepsis Labs: Lactic Acid, Venous    Component Value Date/Time   LATICACIDVEN 2.7 (HH) 09/29/2021 1753    MICROBIOLOGY: No results found for this or any previous visit (from the past 240 hour(s)).  RADIOLOGY STUDIES/RESULTS: No results found.   LOS: 6 days   Oren Binet, MD  [facility]    To contact the attending provider  between 7A-7P or the covering provider during after hours 7P-7A, please log into the web site [email or web address] and access using universal [place] password for that web site. If you do not have the password, please call the hospital operator.  10/05/2021, 11:17 AM

## 2021-10-05 NOTE — Consult Note (Addendum)
Neurology Consultation  Reason for Consult: Ataxia  Referring Physician: Maretta Bees, MD  CC:  Confusion, extremity numbness and frequent falls  History is obtained from: Patient through video language line  HPI: Samuel [name] [facility] [name] is a 51 y.o. male with past medical history for alcoholism with previous falls in last 6 years with multiple ED visits and admission to [facility]. Patient states that his falling started last week. In reviewing his chart he has multiple falls while intoxicated with blunt  head injury and lower extremity abrasions.  Patient state that he drinks beer only and can vary from > 12 beers/day to 1 or 2. Lately he has been out of work and has had less alcohol. 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 Patient has tried to quit several times in the last 10 years w/o any success. Patient states he is homeless and at times stays at friends house with his wife and children, sleeping on the couch and doing chores for food and drinks. Patient states prior to this admission he had noticed increased numbness of both hands right < left and having difficulty with right hand fine motor movements. He also c/o lower extremity bilateral  feet neuropathy and lower extremity swelling.  He states the reason he fell prior to his admission was that he was walking out side in the muddy walkway. He had gone out to buy beer in "plastic jugs" not  "bottles" because if he drops them they wont break. I asked the patient if he can tell me the name of the city and hospital. Per interpreter he stated that he does not have a phone and needs to call his friend to come to the restaurant.(He was eating lunch at the bedside) He could not  recall what city or state of his residence was. He did mention living in [place].  Patient states his numbness of both hands have gotten worse and has constant numbness in all of his digits.  This is new since 3 weeks ago. Patient did have more falls witnessed by his friend , who said "that he drinks a lot every day". His Etoh Level in ED >200.  Patient now admitted for observation, placed on CIWA Protocol . He has not had any reported DT's and primary team is concerned about Ataxic gait.  Review of system : 12 point review per HPI   Past Medical History:  Diagnosis Date   Alcohol use    ETOH abuse     History reviewed. No pertinent family history.   Social History:   reports that he has never smoked. He has never used smokeless tobacco. He reports current alcohol use of about 72.0 standard drinks of alcohol per week. He reports that he does not use drugs.  Medications  Current Facility-Administered Medications:    acetaminophen (TYLENOL) tablet 650 mg, 650 mg, Oral, Q6H PRN **OR** acetaminophen (TYLENOL) suppository 650 mg, 650 mg, Rectal, Q6H PRN, Howerter, Justin B, DO   enoxaparin (LOVENOX) injection 40 mg, 40 mg, Subcutaneous, Q24H, Mikey College T, MD, 40 mg at 10/05/21 6045   folic acid (FOLVITE) tablet 1 mg, 1 mg, Oral, Daily, Pollina, Canary Brim, MD, 1 mg at 10/05/21 0937   levothyroxine (SYNTHROID) tablet 25 mcg, 25 mcg, Oral, Q0600, Maretta Bees, MD, 25 mcg at 10/05/21 0937   magnesium sulfate IVPB 4 g 100 mL, 4 g, Intravenous, BID, Ghimire, Shanker  M, MD, Last Rate: 50 mL/hr at 10/05/21 1204, 4 g at 10/05/21 1204   multivitamin with minerals tablet 1 tablet, 1 tablet, Oral, Daily, Pollina, Canary Brim, MD, 1 tablet at 10/05/21 0936   ondansetron (ZOFRAN) tablet 4 mg, 4 mg, Oral, Q6H PRN **OR** ondansetron (ZOFRAN) injection 4 mg, 4 mg, Intravenous, Q6H PRN, Emeline General, MD   senna-docusate (Senokot-S) tablet 1 tablet, 1 tablet, Oral, QHS PRN, Mikey College T, MD   thiamine (VITAMIN B1) tablet 100 mg, 100 mg, Oral, Daily, Ghimire, Werner Lean, MD, 100 mg at 10/05/21 5681   Exam: Current vital signs: BP 128/83   Pulse (!) 109   Temp 98.3 F (36.8 C)  (Oral)   Resp 15   Ht 5\' 4"  (1.626 m)   Wt 53.6 kg   SpO2 96%   BMI 20.28 kg/m  Vital signs in last 24 hours: Temp:  [98 F (36.7 C)-98.4 F (36.9 C)] 98.3 F (36.8 C) (09/19 0800) Pulse Rate:  [109-118] 109 (09/19 0000) Resp:  [15-18] 15 (09/18 2000) BP: (112-128)/(78-83) 128/83 (09/18 2000) SpO2:  [94 %-100 %] 96 % (09/19 0000)  GENERAL: Thin appearing Hispanic male Awake, alert , pleasant , cooperative in NAD HEENT: - Normocephalic and atraumatic, dry mm, no LN++, no Thyromegally LUNGS - Clear to auscultation bilaterally with no wheezes CV - S1S2 RRR, no m/r/g, equal pulses bilaterally. ABDOMEN - Soft, nontender, nondistended with normoactive BS Ext: warm, well perfused, intact peripheral pulses, no edema  NEURO:  Patient examination was assisted by video language interpreter  Mental Status: AA&Ox3 ,follows commands , he only knew the year 2023, not month, dob, day of the week or city of residence. Language: speech is  normal , no slurring Naming, repetition, fluency, and comprehension intact. Cranial Nerves: PERRL 42mm bilat, EOMI, visual fields full and intact, no facial asymmetry  facial sensation intact, hearing intact, tongue/uvula/soft palate midline, normal sternocleidomastoid and trapezius muscle strength. No evidence of tongue atrophy or fibrillations Motor:  no drift.  Tone: is normal and bulk is normal Sensation- Intact to light touch bilaterally Coordination:  Patient had difficulty with FTN  and LUE  is ataxic  Gait- Patient with severely ataxic gait requiring contact guard. It is wide based and unsteady.    Labs I have reviewed labs in epic and the results pertinent to this consultation are:  CBC    Component Value Date/Time   WBC 10.3 10/01/2021 0652   RBC 3.14 (L) 10/01/2021 0652   HGB 10.8 (L) 10/01/2021 0652   HCT 30.9 (L) 10/01/2021 0652   PLT 148 (L) 10/01/2021 0652   MCV 98.4 10/01/2021 0652   MCH 34.4 (H) 10/01/2021 0652   MCHC 35.0 10/01/2021  0652   RDW 16.0 (H) 10/01/2021 0652   LYMPHSABS 1.4 09/29/2021 0258   MONOABS 1.2 (H) 09/29/2021 0258   EOSABS 0.2 09/29/2021 0258   BASOSABS 0.1 09/29/2021 0258    CMP     Component Value Date/Time   NA 134 (L) 10/05/2021 0739   K 3.6 10/05/2021 0739   CL 102 10/05/2021 0739   CO2 22 10/05/2021 0739   GLUCOSE 92 10/05/2021 0739   BUN 8 10/05/2021 0739   CREATININE 0.46 (L) 10/05/2021 0739   CALCIUM 8.6 (L) 10/05/2021 0739   PROT 6.3 (L) 10/05/2021 0739   ALBUMIN 2.8 (L) 10/05/2021 0739   AST 102 (H) 10/05/2021 0739   ALT 27 10/05/2021 0739   ALKPHOS 165 (H) 10/05/2021 0739   BILITOT 1.0 10/05/2021  0739   GFRNONAA >60 10/05/2021 0739   GFRAA >60 09/30/2019 0142    Lipid Panel  No results found for: "CHOL", "TRIG", "HDL", "CHOLHDL", "VLDL", "LDLCALC", "LDLDIRECT"   Imaging I have reviewed the images obtained:  CT-head:09/29/21 IMPRESSION: 1. No acute intracranial abnormality. Chronic advanced for age cerebral volume loss. 2. Chronic nasal bone fractures.  MRI examination of the brain w/o: 1. No acute intracranial pathology. 2. Stable chronic findings   Assessment: This is a 51 yo Hispanic male with long history of Etoh Abuse with multiple ED visits for falling and paresthesia of extremities. Patient now having multiple falls with excuse of "tripping vs slipping". He has reported extreme numbness of booth feet with lower extremity weakness and coincides with severe peripheral neuropathy. He has been reported as being confused which he did demonstrate during my neurological examination.  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 B12/folate deficiency should also be  considered.  Impression: Severe Etoh abuse Metabolic Encephalopathy Wernicke's Encephalopathy Paresthesia 2/2 folate/B12 deficiency secondary to Alcoholism   Recommendations: Neuro check q 4 hours Continue CIWA Protocol Check Vitamin B1, B12 and Folate levels MRI cervical spine to assess for possible B12 deficiency myelopathy (signal change in the dorsal columns of the spinal cord) Start High Dose Thiamine 500 mg IV x 3 days then 100 mg po qday Telemetry Fall precautions EtOH cessation counseling  Elenora Gamma , PA-C [facility]  I have seen and examined the patient. I have discussed the assessment and recommendations with the Neurohospitalist NP. 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 Exam reveals prominent appendicular and gait ataxia as well as cognitive impairment. Recommendations as above.  Electronically signed: Dr. Kerney Elbe

## 2021-10-05 NOTE — Progress Notes (Cosign Needed Addendum)
Physical Therapy Treatment Patient Details Name: Samuel [name] MRN: [id number] DOB: 1970-05-27 Today's Date: 10/05/2021   History of Present Illness 51 yo male presents to [facility] on 9/13 with bilat hand and feet numbness, LE swelling, frequent falls. CTH negative for acute findings. Workup for Wernicke's encephalopathy. Recent admission 8/23 for syncope and withdrawals. PMH: alcohol abuse, fall 1 year ago resulting in subarachnoid hemorrhage and thoracic fractures, homelessness.    PT Comments    Pt received in supine, agreeable to therapy session with emphasis on transfer and gait safety, activity pacing and safe use of RW. Pt A&O x1-2, not seeming oriented to location/situation but following instructions for simple mobility tasks with increased time, pt with poor carryover of safety instructions and tending to abandon RW in narrow spaces. Pt with ataxic gait pattern and c/o hand pain which was possibly contributing to RW abandonment. Pt reports rib fx but per chart review this was chronic. If constant supervision/physical assist not available at previous home setting, may need to consider post-acute therapies as pt currently needing constant assist for safety and fall risk prevention. Pt continues to benefit from PT services to progress toward functional mobility goals.   Recommendations for follow up therapy are one component of a multi-disciplinary discharge planning process, led by the attending physician.  Recommendations may be updated based on patient status, additional functional criteria and insurance authorization.  Follow Up Recommendations  Outpatient PT (need to clarify home support, pt states he plans to stay with his friend at d/c and they can physically assist him as needed)     Assistance Recommended at Discharge Frequent or constant Supervision/Assistance  Patient can return home with the following A little help with walking and/or transfers;A little help with  bathing/dressing/bathroom;Direct supervision/assist for medications management;Direct supervision/assist for financial management;Assist for transportation   Equipment Recommendations  Rolling walker (2 wheels) (pt reports his cane disappeared or was thrown away)    Recommendations for Other Services       Precautions / Restrictions Precautions Precautions: Fall Restrictions Weight Bearing Restrictions: No     Mobility  Bed Mobility Overal bed mobility: Needs Assistance Bed Mobility: Supine to Sit     Supine to sit: Min guard     General bed mobility comments: pt using bed features; to R EOB    Transfers Overall transfer level: Needs assistance Equipment used: Rolling walker (2 wheels) Transfers: Sit to/from Stand Sit to Stand: Min guard           General transfer comment: assist to rise and steady, cues for safe hand placement; EOB>RW and RW<>BSC and to recliner chair    Ambulation/Gait Ambulation/Gait assistance: Min guard, Min assist Gait Distance (Feet): 70 Feet Assistive device: Rolling walker (2 wheels) Gait Pattern/deviations: Step-through pattern, Decreased stride length, Wide base of support, Ataxic Gait velocity: decr     General Gait Details: wide based gait with assist to steady and guide RW, max cuing for positioning in RW and not to abandon RW prior to sitting. Pt given visual demo for proximity to RW but still keeps it too far advanced and has difficulty maintaining consistent grip, possibly due to B hand pain/fatigue      Balance Overall balance assessment: Needs assistance, History of Falls Sitting-balance support: No upper extremity supported, Feet supported Sitting balance-Leahy Scale: Fair Sitting balance - Comments: impulsive   Standing balance support: Reliant on assistive device for balance Standing balance-Leahy Scale: Poor Standing balance comment: less steady without RW, intermittent  minA for RW mgmt and external assist with  instability                            Cognition Arousal/Alertness: Awake/alert Behavior During Therapy: Flat affect Overall Cognitive Status: Impaired/Different from baseline Area of Impairment: Orientation, Attention, Memory, Following commands, Safety/judgement, Problem solving                 Orientation Level: Disoriented to, Place, Time Current Attention Level: Sustained Memory: Decreased short-term memory, Decreased recall of precautions Following Commands: Follows one step commands inconsistently Safety/Judgement: Decreased awareness of safety, Decreased awareness of deficits   Problem Solving: Slow processing, Decreased initiation, Difficulty sequencing, Requires verbal cues, Requires tactile cues General Comments: Pt states he is "at the restaurant", able to state that he lives "in [place], [place]" but also reports that he came here from "far away, an hour and a half". pt frequently needs cues for safe orientation inside RW and use of AD.        Exercises      General Comments General comments (skin integrity, edema, etc.): HR 125-136 bpm with exertional tasks; SpO2 WFL on RA; pt in bathroom and had BM, NT notified      Pertinent Vitals/Pain Pain Assessment Pain Assessment: Faces Faces Pain Scale: Hurts little more Pain Location: B knees, L ribcage, feet, hands Pain Descriptors / Indicators: Discomfort, Grimacing, Pins and needles Pain Intervention(s): Repositioned, Monitored during session           PT Goals (current goals can now be found in the care plan section) Acute Rehab PT Goals Patient Stated Goal: home with friend PT Goal Formulation: With patient Time For Goal Achievement: 10/14/21 Progress towards PT goals: Progressing toward goals    Frequency    Min 3X/week      PT Plan Current plan remains appropriate       AM-PAC PT "6 Clicks" Mobility   Outcome Measure  Help needed turning from your back to your  side while in a flat bed without using bedrails?: A Little Help needed moving from lying on your back to sitting on the side of a flat bed without using bedrails?: A Little Help needed moving to and from a bed to a chair (including a wheelchair)?: A Little Help needed standing up from a chair using your arms (e.g., wheelchair or bedside chair)?: A Little Help needed to walk in hospital room?: A Little Help needed climbing 3-5 steps with a railing? : A Lot 6 Click Score: 17    End of Session Equipment Utilized During Treatment: Gait belt Activity Tolerance: Patient tolerated treatment well;Other (comment) (BLE pain and UE pain and fatigue limiting ambulatory distance) Patient left: in chair;with call bell/phone within reach;with chair alarm set Nurse Communication: Mobility status;Other (comment) (pt IV is disconnected and was dripping on the floor; pt up in chair to eat dinner) PT Visit Diagnosis: Other abnormalities of gait and mobility (R26.89);Unsteadiness on feet (R26.81);Repeated falls (R29.6)     Time: [id number] PT Time Calculation (min) (ACUTE ONLY): 44 min  Charges:  $Gait Training: 8-22 mins $Therapeutic Activity: 8-22 mins                     Sou Nohr P., PTA Acute Rehabilitation Services Secure Chat Preferred 9a-5:30pm Office: [place] 10/05/2021, 6:13 PM

## 2021-10-06 ENCOUNTER — Other Ambulatory Visit (HOSPITAL_COMMUNITY): Payer: Self-pay

## 2021-10-06 LAB — BASIC METABOLIC PANEL
Anion gap: 12 (ref 5–15)
BUN: 7 mg/dL (ref 6–20)
CO2: 21 mmol/L — ABNORMAL LOW (ref 22–32)
Calcium: 8.9 mg/dL (ref 8.9–10.3)
Chloride: 101 mmol/L (ref 98–111)
Creatinine, Ser: 0.41 mg/dL — ABNORMAL LOW (ref 0.61–1.24)
GFR, Estimated: >60 mL/min (ref >60)
Glucose, Bld: 95 mg/dL (ref 70–99)
Potassium: 4.1 mmol/L (ref 3.5–5.1)
Sodium: 134 mmol/L — ABNORMAL LOW (ref 135–145)

## 2021-10-06 LAB — MAGNESIUM: Magnesium: 2 mg/dL (ref 1.7–2.4)

## 2021-10-06 MED ORDER — LORAZEPAM 2 MG/ML IJ SOLN
1.0000 mg | Freq: Four times a day (QID) | INTRAMUSCULAR | Status: DC | PRN
  Administered 2021-10-06 – 2021-10-10 (×3): 1 mg via INTRAVENOUS
  Filled 2021-10-06 (×3): qty 1

## 2021-10-06 NOTE — Progress Notes (Addendum)
Physical Therapy Treatment Patient Details Name: Samuel [name] MRN: [id number] DOB: 26-Feb-1970 Today's Date: 10/06/2021   History of Present Illness 51 yo male presents to [facility] on 9/13 with bilat hand and feet numbness, LE swelling, frequent falls. CTH negative for acute findings. Workup for Wernicke's encephalopathy. Recent admission 8/23 for syncope and withdrawals. PMH: alcohol abuse, fall 1 year ago resulting in subarachnoid hemorrhage and thoracic fractures, homelessness.    PT Comments    Pt received up ad lib in hallway, pt unsteady and having self-doffed his telemetry wires, pt agreeable to therapy session with emphasis on safety instruction during gait/transfers, use of AD and fall risk prevention. Pt remains not oriented to location, situation or time and requesting beer when asked what he needs. Pt reoriented again to situation and PTA encouraged abstinence from alcohol due to pt continued confusion/memory loss, pt receptive but will need reinforcement. Pt needing up to minA for gait progression without AD and min guard for safety when using rollator. Pt would not be safe currently to discharge without consistent supervision/assist due to cognitive deficit and instability. Pt continues to benefit from PT services to progress toward functional mobility goals.   Recommendations for follow up therapy are one component of a multi-disciplinary discharge planning process, led by the attending physician.  Recommendations may be updated based on patient status, additional functional criteria and insurance authorization.  Follow Up Recommendations  Outpatient PT (need to clarify home support, pt states he plans to stay with his friend at d/c and they can physically assist him as needed, if not may need to consider post-acute rehab or assisted living as pt remains confused)     Assistance Recommended at Discharge Frequent or constant Supervision/Assistance  Patient can return home  with the following A little help with walking and/or transfers;A little help with bathing/dressing/bathroom;Direct supervision/assist for medications management;Direct supervision/assist for financial management;Assist for transportation;Help with stairs or ramp for entrance   Equipment Recommendations  Rolling walker (2 wheels);Other (comment) (pt reports his cane disappeared or was thrown away)    Recommendations for Other Services       Precautions / Restrictions Precautions Precautions: Fall Restrictions Weight Bearing Restrictions: No     Mobility  Bed Mobility        General bed mobility comments: received up and remained in chair    Transfers Overall transfer level: Needs assistance Equipment used: Rolling walker (2 wheels) Transfers: Sit to/from Stand Sit to Stand: Supervision           General transfer comment: cues for safer UE placement    Ambulation/Gait Ambulation/Gait assistance: Min guard, Min assist Gait Distance (Feet): 100 Feet (79ft with no AD and up to minA, then 175ft with RW and min guard) Assistive device: Rollator (4 wheels) Gait Pattern/deviations: Step-through pattern, Decreased stride length, Wide base of support, Ataxic       General Gait Details: wide based gait, pt with improved stability today using rollator, pt needs cues/assist to manage use of brakes however. Pt abandons 4WW in narrow spaces despite cues to improve proximity to AD and allow staff to assist him. Pt needs dense cues for wayfinding in hallway.     Balance Overall balance assessment: Needs assistance Sitting-balance support: Feet supported Sitting balance-Leahy Scale: Good Sitting balance - Comments: impulsive   Standing balance support: Reliant on assistive device for balance Standing balance-Leahy Scale: Poor Standing balance comment: less steady without AD, min guard for AD mgmt and external assist with instability  without AD                             Cognition Arousal/Alertness: Awake/alert Behavior During Therapy: Impulsive Overall Cognitive Status: Impaired/Different from baseline Area of Impairment: Orientation, Memory, Safety/judgement, Problem solving                 Orientation Level: Disoriented to, Place, Time   Memory: Decreased short-term memory Following Commands: Follows one step commands with increased time Safety/Judgement: Decreased awareness of safety, Decreased awareness of deficits   Problem Solving: Requires verbal cues, Slow processing General Comments: Decreased awareness of safety and deficits and poor insight into his condition. Pt found up in hallway, unaware of location/situation. Pt reoriented to hospital location and reason for admission/situation, pt then asking for beer when asked what he needs. Pt reports he needs to work and doesn't have money to pay for food/drink, PTA reoriented pt to situation.        Exercises      General Comments General comments (skin integrity, edema, etc.): Interpreter # [phone] Cletus Gash once pt returned to room, some interpretation during hallway gait trial provided by PTA as pt received up ad lib and unsteady. Chair alarm on for pt safety at end of session.      Pertinent Vitals/Pain Pain Assessment Pain Assessment: Faces Faces Pain Scale: Hurts little more Pain Location: B hands and feet (legs below knees) Pain Descriptors / Indicators: Numbness Pain Intervention(s): Monitored during session, Repositioned           PT Goals (current goals can now be found in the care plan section) Acute Rehab PT Goals Patient Stated Goal: To go home and work PT Goal Formulation: With patient Time For Goal Achievement: 10/14/21 Progress towards PT goals: Progressing toward goals    Frequency    Min 3X/week      PT Plan Current plan remains appropriate       AM-PAC PT "6 Clicks" Mobility   Outcome Measure  Help needed turning from your back to your side  while in a flat bed without using bedrails?: A Little Help needed moving from lying on your back to sitting on the side of a flat bed without using bedrails?: A Little Help needed moving to and from a bed to a chair (including a wheelchair)?: A Little Help needed standing up from a chair using your arms (e.g., wheelchair or bedside chair)?: A Little Help needed to walk in hospital room?: A Little Help needed climbing 3-5 steps with a railing? : A Lot 6 Click Score: 17    End of Session Equipment Utilized During Treatment: Gait belt Activity Tolerance: Patient tolerated treatment well Patient left: in chair;with call bell/phone within reach;with chair alarm set Nurse Communication: Mobility status;Other (comment);Precautions (pt got up unassisted from bathroom, needs consistent assist due to pt confusion) PT Visit Diagnosis: Other abnormalities of gait and mobility (R26.89);Unsteadiness on feet (R26.81);Repeated falls (R29.6)     Time: 7616-0737 PT Time Calculation (min) (ACUTE ONLY): 23 min  Charges:  $Gait Training: 8-22 mins $Therapeutic Activity: 8-22 mins                     Raia Amico P., PTA Acute Rehabilitation Services Secure Chat Preferred 9a-5:30pm Office: [place] 10/06/2021, 6:23 PM

## 2021-10-06 NOTE — TOC Progression Note (Signed)
Transition of Care [facility]) - Progression Note    Patient Details  Name: Samuel [name] MRN: [id number] Date of Birth: 1970-10-17  Transition of [facility]) CM/SW [place], RN Phone Number: 10/06/2021, 3:06 PM  Clinical Narrative:     Spoke to patient at bedside using interpretor.  Patient states he lives with a lady, Margoht, and her children.  Patient states when he drinks alcohol Margoht will not let him stay in the house so he sleeps outside.  This RNCM attempted to call Margoht but the number is out of service.  Patient stated that [name] has his sister's number who lives in [place].  Spoke to [place] who states she can not help him at discharge.  This RNCM encouraged the patient to stop drinking alcohol but patient is not interested in stopping.   TOC will continue to follow  Expected Discharge Plan: Home/Self Care Barriers to Discharge: Continued Medical Work up  Expected Discharge Plan and Services Expected Discharge Plan: Home/Self Care In-house Referral: Clinical Social Work Discharge Planning Services: CM Consult   Living arrangements for the past 2 months: [place]: [place] (charity) Date [place]: 10/05/21 Time [place]: 1230 Representative spoke with at [place]: Amy   Social Determinants of Health ([place]) Interventions Food Insecurity Interventions: Inpatient TOC  Readmission Risk Interventions    07/21/2020    2:26 PM  Readmission Risk Prevention Plan  Medication Screening Complete  Transportation Screening Complete

## 2021-10-06 NOTE — Progress Notes (Signed)
PROGRESS NOTE        PATIENT DETAILS Name: Samuel [name] Age: 51 y.o. Sex: male Date of Birth: [birth year] Admit Date: 09/29/2021 Admitting Physician Lequita Halt, MD [id number], No Pcp Per  Brief Summary: Patient is a 51 y.o.  male with longstanding history of EtOH use-presented with approximately 1 week history of tingling/numbness involving all 4 extremities and frequent falls.  He was admitted to the hospitalist service-started on a high-dose IV thiamine-unfortunately hospital course complicated by alcohol withdrawal symptoms.  Significant events: 9/13>> admit to [facility]-frequent falls/tingling/numbness to all 4 extremities  Significant studies: 9/13>> CT head: No acute intracranial abnormalities 9/13>> CT C-spine: No fracture/dislocation 9/13>> CT T-spine: Sequelae of T7 compression and T10 Chance fractures-interval healing.  No acute osseous abnormality seen. 9/13>> CT L-spine: No acute osseous abnormality seen. 9/13>> CT chest: No acute traumatic injury.  Chronic right clavicle/scapula/bilateral rib fractures. 9/13>> CT abdomen/pelvis: No traumatic injury 9/13>> vitamin B12: 584 9/13>> vitamin B1: 33.9 (low) 9/14>> MRI brain: No acute intracranial abnormality. 9/14>> SPEP/light chain: No M spike 9/14>> HBsAg/anti-HCV Ab: Negative 9/14>> ANA positive, dsDNA slightly elevated at 13. 9/14>> A1c 5.4 9/14>> TSH: 11.067. 9/16>> FT4: 0.81 (normal)  Significant microbiology data: None  Procedures: None  Consults: None  Subjective: Somewhat confused this morning-remains ataxic.  Per nursing staff-he thought he was in a restaurant and was asking for beer this morning.  Interviewed with Music therapist at bedside-needed minimal redirection-was not very confused with me.  Objective: Vitals: Blood pressure (!) 148/89, pulse (!) 103, temperature 98.7 F (37.1 C), temperature source Oral, resp. rate 18, height 5\' 4"  (1.626 m), weight  53.6 kg, SpO2 97 %.   Exam: Gen Exam:not in any distress HEENT:atraumatic, normocephalic Chest: B/L clear to auscultation anteriorly CVS:S1S2 regular Abdomen:soft non tender, non distended Extremities:no edema Neurology: Non focal Skin: no rash   Pertinent Labs/Radiology:    Latest Ref Rng & Units 10/01/2021    6:52 AM 09/29/2021    2:58 AM 09/02/2021    2:30 AM  CBC  WBC 4.0 - 10.5 K/uL 10.3  11.7  7.7   Hemoglobin 13.0 - 17.0 g/dL 10.8  10.3  11.8   Hematocrit 39.0 - 52.0 % 30.9  29.0  32.9   Platelets 150 - 400 K/uL 148  141  101     Lab Results  Component Value Date   NA 134 (L) 10/06/2021   K 4.1 10/06/2021   CL 101 10/06/2021   CO2 21 (L) 10/06/2021      Assessment/Plan: Frequent falls/unsteady gait/peripheral neuropathy: Continue history of ataxic gait-continues to have waxing/waning confusion-Per neurology-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  Unfortunately-no response to 5 days of high-dose IV thiamine-recommendations are to continue lifelong oral thiamine supplementation-and a MRI C-spine to make sure he does not have any cervical pathology.  Will need to stop drinking alcohol.  Counseling provided extensively.  EtOH withdrawal: Longstanding history of alcohol use-withdrawal symptoms significantly continue to improve-some intermittent confusion is likely related to Korsakoff/Wernicke's encephalopathy.  Should be on out of the window for withdrawal symptoms.  Supportive care for the time being.  Hypomagnesemia: Due to EtOH use-repleted.  Hypokalemia: Due to EtOH use-repleted.  Normocytic anemia: No evidence of blood loss-probably due to alcohol use-bone marrow suppression.  But given neuropathy- SPEP/UPEP/light chains-ordered and  pending.  Continue to follow CBC periodically.  Thrombocytopenia: Likely due to alcohol use-mild-follow  periodically.  Hypothyroidism: Mild-have started low-dose Synthroid to see if this will improve any of his symptoms.  Recheck TSH in 3 months.  ?  Lupus: Unclear significance of slightly elevated dsDNA titers-not sure if this has any role in his neuropathic symptoms.  Will need outpatient follow-up with rheumatology.  BMI: Estimated body mass index is 20.28 kg/m as calculated from the following:   Height as of this encounter: 5\' 4"  (1.626 m).   Weight as of this encounter: 53.6 kg.   Code status:   Code Status: Full Code   DVT Prophylaxis: enoxaparin (LOVENOX) injection 40 mg Start: 09/29/21 1000 SCDs Start: 09/29/21 10/01/21   Family Communication: None at bedside   Disposition Plan: Status is: Inpatient Remains inpatient appropriate because: Resolving alcohol withdrawal symptoms-Wernicke/Korsakoff encephalopathy-awaiting MRI C-spine.   Planned Discharge Destination:Home likely tomorrow if MRI C-spine negative.  Diet: Diet Order             Diet regular Room service appropriate? Yes; Fluid consistency: Thin  Diet effective now                     Antimicrobial agents: Anti-infectives (From admission, onward)    None        MEDICATIONS: Scheduled Meds:  enoxaparin (LOVENOX) injection  40 mg Subcutaneous Q24H   folic acid  1 mg Oral Daily   levothyroxine  25 mcg Oral Q0600   multivitamin with minerals  1 tablet Oral Daily   thiamine  100 mg Oral Daily   Continuous Infusions:   PRN Meds:.acetaminophen **OR** acetaminophen, ondansetron **OR** ondansetron (ZOFRAN) IV, senna-docusate   I have personally reviewed following labs and imaging studies  LABORATORY DATA: CBC: Recent Labs  Lab 10/01/21 0652  WBC 10.3  HGB 10.8*  HCT 30.9*  MCV 98.4  PLT 148*     Basic Metabolic Panel: Recent Labs  Lab 10/01/21 0652 10/02/21 0713 10/03/21 0703 10/04/21 0421 10/05/21 0739 10/06/21 0608  NA 136 133* 133* 136 134* 134*  K 3.6 4.1 4.1 3.9 3.6 4.1   CL 101 100 101 104 102 101  CO2 27 24 21* 25 22 21*  GLUCOSE 111* 93 93 93 92 95  BUN 7 8 9 10 8 7   CREATININE 0.48* 0.44* 0.50* 0.47* 0.46* 0.41*  CALCIUM 8.6* 8.8* 8.9 8.8* 8.6* 8.9  MG 1.5* 1.6* 1.6* 1.6* 1.4* 2.0  PHOS 3.4 3.9 4.2  --   --   --      GFR: Estimated Creatinine Clearance: 84.7 mL/min (A) (by C-G formula based on SCr of 0.41 mg/dL (L)).  Liver Function Tests: Recent Labs  Lab 09/30/21 0415 10/01/21 0652 10/04/21 0421 10/05/21 0739  AST 79* 61* 126* 102*  ALT 21 18 28 27   ALKPHOS 205* 177* 173* 165*  BILITOT 1.2 1.0 0.9 1.0  PROT 6.2* 6.3* 6.3* 6.3*  ALBUMIN 2.9* 2.9* 2.8* 2.8*    No results for input(s): "LIPASE", "AMYLASE" in the last 168 hours.  No results for input(s): "AMMONIA" in the last 168 hours.   Coagulation Profile: No results for input(s): "INR", "PROTIME" in the last 168 hours.   Cardiac Enzymes: No results for input(s): "CKTOTAL", "CKMB", "CKMBINDEX", "TROPONINI" in the last 168 hours.  BNP (last 3 results) No results for input(s): "PROBNP" in the last 8760 hours.  Lipid Profile: No results for input(s): "CHOL", "HDL", "LDLCALC", "TRIG", "CHOLHDL", "LDLDIRECT" in the last 72 hours.  Thyroid Function Tests: No results for input(s): "TSH", "T4TOTAL", "FREET4", "T3FREE", "THYROIDAB" in the last 72 hours.   Anemia Panel: No results for input(s): "VITAMINB12", "FOLATE", "FERRITIN", "TIBC", "IRON", "RETICCTPCT" in the last 72 hours.   Urine analysis:    Component Value Date/Time   COLORURINE YELLOW 09/29/2021 0258   APPEARANCEUR CLEAR 09/29/2021 0258   LABSPEC 1.004 (L) 09/29/2021 0258   PHURINE 7.0 09/29/2021 0258   GLUCOSEU NEGATIVE 09/29/2021 0258   HGBUR NEGATIVE 09/29/2021 0258   BILIRUBINUR NEGATIVE 09/29/2021 0258   KETONESUR NEGATIVE 09/29/2021 0258   PROTEINUR NEGATIVE 09/29/2021 0258   NITRITE NEGATIVE 09/29/2021 0258   LEUKOCYTESUR NEGATIVE 09/29/2021 0258    Sepsis Labs: Lactic Acid, Venous    Component  Value Date/Time   LATICACIDVEN 2.7 (HH) 09/29/2021 1753    MICROBIOLOGY: No results found for this or any previous visit (from the past 240 hour(s)).  RADIOLOGY STUDIES/RESULTS: No results found.   LOS: 7 days   Jeoffrey Massed, MD  [facility]    To contact the attending provider between 7A-7P or the covering provider during after hours 7P-7A, please log into the web site [email or web address] and access using universal [place] password for that web site. If you do not have the password, please call the hospital operator.  10/06/2021, 11:34 AM

## 2021-10-06 NOTE — Progress Notes (Signed)
Occupational Therapy Treatment Patient Details Name: Josyah Achor MRN: [id number] DOB: Dec 02, 1970 Today's Date: 10/06/2021   History of present illness 51 yo male presents to [facility] on 9/13 with bilat hand and feet numbness, LE swelling, frequent falls. CTH negative for acute findings. Workup for Wernicke's encephalopathy. Recent admission 8/23 for syncope and withdrawals. PMH: alcohol abuse, fall 1 year ago resulting in subarachnoid hemorrhage and thoracic fractures, homelessness.   OT comments  Patient remains impulsive, and asking for beer, but is walking better, and is able to grasp and release cup better.  Patient stood at sink, and brushed his teeth, needing only assist to place toothpaste on the toothbrush.  OT can continue efforts, and unsure about discharge disposition.  Out patient can be considered, but may not be needed depending on assist post acute and progress.      Recommendations for follow up therapy are one component of a multi-disciplinary discharge planning process, led by the attending physician.  Recommendations may be updated based on patient status, additional functional criteria and insurance authorization.    Follow Up Recommendations  Outpatient OT    Assistance Recommended at Discharge Intermittent Supervision/Assistance  Patient can return home with the following  A little help with bathing/dressing/bathroom;A little help with walking and/or transfers;Assist for transportation;Help with stairs or ramp for entrance;Direct supervision/assist for medications management;Assistance with cooking/housework;Direct supervision/assist for financial management   Equipment Recommendations       Recommendations for Other Services      Precautions / Restrictions Precautions Precautions: Fall Restrictions Weight Bearing Restrictions: No       Mobility Bed Mobility   Bed Mobility: Supine to Sit     Supine to sit: Supervision           Transfers Overall transfer level: Needs assistance Equipment used: Rolling walker (2 wheels) Transfers: Sit to/from Stand Sit to Stand: Supervision                 Balance Overall balance assessment: Needs assistance Sitting-balance support: Feet supported Sitting balance-Leahy Scale: Good Sitting balance - Comments: impulsive   Standing balance support: Reliant on assistive device for balance Standing balance-Leahy Scale: Poor                             ADL either performed or assessed with clinical judgement   ADL       Grooming: Wash/dry hands;Minimal assistance;Standing           Upper Body Dressing : Set up;Sitting   Lower Body Dressing: Minimal assistance;Sit to/from stand   Toilet Transfer: Minimal assistance;Rolling walker (2 wheels);Regular Toilet                  Extremity/Trunk Assessment Upper Extremity Assessment RUE Coordination: decreased fine motor;decreased gross motor LUE Coordination: decreased fine motor;decreased gross motor       Cervical / Trunk Assessment Cervical / Trunk Assessment: Normal    Vision       Perception     Praxis      Cognition Arousal/Alertness: Awake/alert Behavior During Therapy: WFL for tasks assessed/performed                             Safety/Judgement: Decreased awareness of safety, Decreased awareness of deficits   Problem Solving: Requires verbal cues          Exercises      Shoulder  Instructions       General Comments      Pertinent Vitals/ Pain       Pain Assessment Pain Assessment: Faces Faces Pain Scale: No hurt Pain Intervention(s): Monitored during session                                                          Frequency  Min 2X/week        Progress Toward Goals  OT Goals(current goals can now be found in the care plan section)  Progress towards OT goals: Progressing toward goals  Acute Rehab OT  Goals Time For Goal Achievement: 10/15/21 Potential to Achieve Goals: Good  Plan Discharge plan remains appropriate    Co-evaluation                 AM-PAC OT "6 Clicks" Daily Activity     Outcome Measure   Help from another person eating meals?: None Help from another person taking care of personal grooming?: A Little Help from another person toileting, which includes using toliet, bedpan, or urinal?: A Little Help from another person bathing (including washing, rinsing, drying)?: A Little Help from another person to put on and taking off regular upper body clothing?: A Little Help from another person to put on and taking off regular lower body clothing?: A Little 6 Click Score: 19    End of Session Equipment Utilized During Treatment: Rolling walker (2 wheels);Gait belt  OT Visit Diagnosis: Unsteadiness on feet (R26.81);Ataxia, unspecified (R27.0);Other symptoms and signs involving cognitive function   Activity Tolerance Patient tolerated treatment well   Patient Left in chair;with call bell/phone within reach;with chair alarm set   Nurse Communication Mobility status        Time: 1140-1158 OT Time Calculation (min): 18 min  Charges: OT Treatments $Self Care/Home Management : 8-22 mins  10/06/2021  RP, OTR/L  Acute Rehabilitation Services  Office:  [phone]   Suzanna Obey 10/06/2021, 12:14 PM

## 2021-10-07 ENCOUNTER — Inpatient Hospital Stay (HOSPITAL_COMMUNITY): Payer: Self-pay

## 2021-10-07 MED ORDER — GADOPICLENOL 0.5 MMOL/ML IV SOLN
5.4000 mL | Freq: Once | INTRAVENOUS | Status: AC | PRN
  Administered 2021-10-07: 5.4 mL via INTRAVENOUS

## 2021-10-07 MED ORDER — MAGNESIUM OXIDE -MG SUPPLEMENT 400 (240 MG) MG PO TABS
200.0000 mg | ORAL_TABLET | Freq: Two times a day (BID) | ORAL | Status: DC
  Administered 2021-10-07 – 2021-10-09 (×5): 200 mg via ORAL
  Filled 2021-10-07 (×5): qty 1

## 2021-10-07 NOTE — Progress Notes (Addendum)
PROGRESS NOTE        PATIENT DETAILS Name: Samuel [name] Age: 51 y.o. Sex: male Date of Birth: September 22, 1970 Admit Date: 09/29/2021 Admitting Physician Emeline General, MD GEX:[id number], No Pcp Per  Brief Summary: Patient is a 51 y.o.  male with longstanding history of EtOH use-presented with approximately 1 week history of confusion,tingling/numbness involving all 4 extremities and frequent falls.  He was admitted to the hospitalist service-started on a high-dose IV thiamine-unfortunately hospital course complicated by alcohol withdrawal symptoms.  Significant events: 9/13>> admit to [facility]-frequent falls/tingling/numbness to all 4 extremities  Significant studies: 9/13>> CT head: No acute intracranial abnormalities 9/13>> CT C-spine: No fracture/dislocation 9/13>> CT T-spine: Sequelae of T7 compression and T10 Chance fractures-interval healing.  No acute osseous abnormality seen. 9/13>> CT L-spine: No acute osseous abnormality seen. 9/13>> CT chest: No acute traumatic injury.  Chronic right clavicle/scapula/bilateral rib fractures. 9/13>> CT abdomen/pelvis: No traumatic injury 9/13>> vitamin B12: 584 9/13>> vitamin B1: 33.9 (low) 9/14>> MRI brain: No acute intracranial abnormality. 9/14>> SPEP/light chain: No M spike 9/14>> HBsAg/anti-HCV Ab: Negative 9/14>> ANA positive, dsDNA slightly elevated at 13. 9/14>> A1c 5.4 9/14>> TSH: 11.067. 9/16>> FT4: 0.81 (normal) 9/21>> MRI C-spine: No acute abnormality.  Significant microbiology data: None  Procedures: None  Consults: None  Subjective: Awake-remains confused-goes on tangents/confabulating.  Objective: Vitals: Blood pressure 111/81, pulse 95, temperature 99 F (37.2 C), temperature source Oral, resp. rate 20, height 5\' 4"  (1.626 m), weight 55.8 kg, SpO2 94 %.   Exam: Gen Exam:not in any distress HEENT:atraumatic, normocephalic Chest: B/L clear to auscultation anteriorly CVS:S1S2  regular Abdomen:soft non tender, non distended Extremities:no edema Neurology: Non focal Skin: no rash   Pertinent Labs/Radiology:    Latest Ref Rng & Units 10/01/2021    6:52 AM 09/29/2021    2:58 AM 09/02/2021    2:30 AM  CBC  WBC 4.0 - 10.5 K/uL 10.3  11.7  7.7   Hemoglobin 13.0 - 17.0 g/dL 09/04/2021  44.0  10.2   Hematocrit 39.0 - 52.0 % 30.9  29.0  32.9   Platelets 150 - 400 K/uL 148  141  101     Lab Results  Component Value Date   NA 134 (L) 10/06/2021   K 4.1 10/06/2021   CL 101 10/06/2021   CO2 21 (L) 10/06/2021      Assessment/Plan: Confusion-Frequent falls/unsteady gait/peripheral neuropathy: 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  Unfortunately-no response to 5 days of high-dose IV thiamine-now on oral thiamine.  Needs to stop drinking-counseled but unclear as to how much he can understand.  He continues to confabulate-is confused-and is unsafe to discharge unless he has 24/7 care.   EtOH withdrawal: Withdrawal symptoms resolved-has longstanding history of alcohol use.  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    Hypomagnesemia: Due to EtOH use-repleted.  Hypokalemia: Due to EtOH use-repleted.  Normocytic anemia: No evidence of blood loss-probably due to alcohol use-bone marrow suppression.  But given neuropathy- SPEP/UPEP/light chains-ordered and pending.  Continue to follow CBC periodically.  Thrombocytopenia: Likely due to alcohol use-mild-follow periodically.  Hypothyroidism: Mild-have started low-dose Synthroid to see if this will improve any of his symptoms.  Recheck TSH in 3 months.  ?  Lupus: Unclear significance of slightly elevated dsDNA titers-not sure if this has any role in his neuropathic symptoms.  Will need outpatient follow-up with rheumatology.  BMI: Estimated body mass index is 21.12 kg/m as calculated from the following:   Height as of this encounter: 5\' 4"   (1.626 m).   Weight as of this encounter: 55.8 kg.   Code status:   Code Status: Full Code   DVT Prophylaxis: enoxaparin (LOVENOX) injection 40 mg Start: 09/29/21 1000 SCDs Start: 09/29/21 2778   Family Communication: None at bedside   Disposition Plan: Status is: Inpatient Remains inpatient appropriate because: Continues to have confused-confabulating-goes on tangents-not yet safe for discharge.  Likely will need to go home with 24/7 care with family or SNF.   Planned Discharge Destination: Likely SNF-social worker following.  Diet: Diet Order             Diet regular Room service appropriate? No; Fluid consistency: Thin  Diet effective now                     Antimicrobial agents: Anti-infectives (From admission, onward)    None        MEDICATIONS: Scheduled Meds:  enoxaparin (LOVENOX) injection  40 mg Subcutaneous E42P   folic acid  1 mg Oral Daily   levothyroxine  25 mcg Oral Q0600   magnesium oxide  200 mg Oral BID   multivitamin with minerals  1 tablet Oral Daily   thiamine  100 mg Oral Daily   Continuous Infusions:   PRN Meds:.acetaminophen **OR** acetaminophen, LORazepam, ondansetron **OR** ondansetron (ZOFRAN) IV, senna-docusate   I have personally reviewed following labs and imaging studies  LABORATORY DATA: CBC: Recent Labs  Lab 10/01/21 0652  WBC 10.3  HGB 10.8*  HCT 30.9*  MCV 98.4  PLT 148*     Basic Metabolic Panel: Recent Labs  Lab 10/01/21 0652 10/02/21 0713 10/03/21 0703 10/04/21 0421 10/05/21 0739 10/06/21 0608  NA 136 133* 133* 136 134* 134*  K 3.6 4.1 4.1 3.9 3.6 4.1  CL 101 100 101 104 102 101  CO2 27 24 21* 25 22 21*  GLUCOSE 111* 93 93 93 92 95  BUN 7 8 9 10 8 7   CREATININE 0.48* 0.44* 0.50* 0.47* 0.46* 0.41*  CALCIUM 8.6* 8.8* 8.9 8.8* 8.6* 8.9  MG 1.5* 1.6* 1.6* 1.6* 1.4* 2.0  PHOS 3.4 3.9 4.2  --   --   --      GFR: Estimated Creatinine Clearance: 88.2 mL/min (A) (by C-G formula based on SCr of  0.41 mg/dL (L)).  Liver Function Tests: Recent Labs  Lab 10/01/21 0652 10/04/21 0421 10/05/21 0739  AST 61* 126* 102*  ALT 18 28 27   ALKPHOS 177* 173* 165*  BILITOT 1.0 0.9 1.0  PROT 6.3* 6.3* 6.3*  ALBUMIN 2.9* 2.8* 2.8*    No results for input(s): "LIPASE", "AMYLASE" in the last 168 hours.  No results for input(s): "AMMONIA" in the last 168 hours.   Coagulation Profile: No results for input(s): "INR", "PROTIME" in the last 168 hours.   Cardiac Enzymes: No results for input(s): "CKTOTAL", "CKMB", "CKMBINDEX", "TROPONINI" in the last 168 hours.  BNP (last 3 results) No results for input(s): "PROBNP" in the last 8760 hours.  Lipid Profile: No results for input(s): "CHOL", "HDL", "LDLCALC", "TRIG", "CHOLHDL", "LDLDIRECT" in the last 72 hours.  Thyroid Function Tests: No results for input(s): "TSH", "T4TOTAL", "FREET4", "T3FREE", "THYROIDAB" in the last 72 hours.   Anemia Panel: No results for input(s): "VITAMINB12", "FOLATE", "FERRITIN", "TIBC", "IRON", "RETICCTPCT" in the last 72 hours.   Urine analysis:  Component Value Date/Time   COLORURINE YELLOW 09/29/2021 0258   APPEARANCEUR CLEAR 09/29/2021 0258   LABSPEC 1.004 (L) 09/29/2021 0258   PHURINE 7.0 09/29/2021 0258   GLUCOSEU NEGATIVE 09/29/2021 0258   HGBUR NEGATIVE 09/29/2021 0258   BILIRUBINUR NEGATIVE 09/29/2021 0258   KETONESUR NEGATIVE 09/29/2021 0258   PROTEINUR NEGATIVE 09/29/2021 0258   NITRITE NEGATIVE 09/29/2021 0258   LEUKOCYTESUR NEGATIVE 09/29/2021 0258    Sepsis Labs: Lactic Acid, Venous    Component Value Date/Time   LATICACIDVEN 2.7 (HH) 09/29/2021 1753    MICROBIOLOGY: No results found for this or any previous visit (from the past 240 hour(s)).  RADIOLOGY STUDIES/RESULTS: MR CERVICAL SPINE W WO CONTRAST  Result Date: 10/07/2021 CLINICAL DATA:  Initial evaluation for ataxia, cervical pathology suspected. EXAM: MRI CERVICAL SPINE WITHOUT AND WITH CONTRAST TECHNIQUE: Multiplanar  and multiecho pulse sequences of the cervical spine, to include the craniocervical junction and cervicothoracic junction, were obtained without and with intravenous contrast. CONTRAST:  5.4 cc of Vueway. COMPARISON:  Prior CT from 09/29/2021. FINDINGS: Alignment: Straightening of the normal cervical lordosis. No listhesis. Vertebrae: Vertebral body height maintained without acute or chronic fracture. Bone marrow signal intensity within normal limits. No discrete or worrisome osseous lesions. No abnormal marrow edema or enhancement. Cord: Normal signal and morphology.  No abnormal enhancement. 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 Craniocervical junction normal. Paraspinous soft tissues within normal limits. Normal flow voids seen within the vertebral arteries bilaterally. Disc levels: C2-C3: Negative interspace. Mild facet hypertrophy. No canal or foraminal stenosis. C3-C4: Central disc protrusion indents the ventral thecal sac. No significant stenosis or frank cord impingement. Superimposed mild uncovertebral spurring without significant foraminal stenosis. C4-C5: Small left paracentral disc protrusion indents the ventral thecal sac. No significant spinal stenosis or cord impingement. Foramina remain patent. C5-C6: Mild disc bulge with endplate and uncovertebral spurring. Flattening and partial effacement of the ventral thecal sac without significant spinal stenosis. Mild bilateral C6 foraminal narrowing. C6-C7: Mild disc bulge with bilateral uncovertebral spurring, slightly asymmetric to the right. No significant spinal stenosis. Moderate right worse than left C7 foraminal narrowing. C7-T1: Mild disc bulge. Mild bilateral facet hypertrophy. No significant stenosis. Visualized upper thoracic spine demonstrates no significant finding. IMPRESSION: 1. No acute abnormality within the cervical spine or spinal cord. 2.  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 Finding could contribute to ataxia. 3. Multilevel degenerative spondylosis at without significant spinal stenosis or frank cord impingement. Mild bilateral C6 foraminal narrowing, with moderate right worse than left C7 foraminal stenosis related to disc bulge and uncovertebral disease. Electronically Signed   By: Rise Mu M.D.   On: 10/07/2021 04:15     LOS: 8 days   Jeoffrey Massed, MD  [facility]    To contact the attending provider between 7A-7P or the covering provider during after hours 7P-7A, please log into the web site [email or web address] and access using universal [place] password for that web site. If you do not have the password, please call the hospital operator.  10/07/2021, 11:59 AM

## 2021-10-08 NOTE — Progress Notes (Signed)
Physical Therapy Treatment Patient Details Name: Samuel [name] MRN: [id number] DOB: 1970/07/26 Today's Date: 10/08/2021   History of Present Illness 51 yo male presents to [facility] on 9/13 with bilat hand and feet numbness, LE swelling, frequent falls. CTH negative for acute findings. Workup for Wernicke's encephalopathy. Recent admission 8/23 for syncope and withdrawals. PMH: alcohol abuse, fall 1 year ago resulting in subarachnoid hemorrhage and thoracic fractures, homelessness.    PT Comments    Pt received in supine, agreeable to therapy session with good participation, pt oriented to self only, after reorientation pt able to state hospital name with slightly delayed recall but unable to recall events from AM OT session or recall this therapist from session 2 days prior. Pt needing mostly min guard for stability with gait trial using RW but needs minA for mobility without AD. Pt continues to benefit from PT services to progress toward functional mobility goals.    Recommendations for follow up therapy are one component of a multi-disciplinary discharge planning process, led by the attending physician.  Recommendations may be updated based on patient status, additional functional criteria and insurance authorization.  Follow Up Recommendations  Outpatient PT (need to clarify home support, pt states he plans to stay with his friend at d/c and they can physically assist him as needed, if not may need to consider post-acute rehab or assisted living as pt remains confused)     Assistance Recommended at Discharge Frequent or constant Supervision/Assistance  Patient can return home with the following A little help with walking and/or transfers;A little help with bathing/dressing/bathroom;Direct supervision/assist for medications management;Direct supervision/assist for financial management;Assist for transportation;Help with stairs or ramp for entrance   Equipment Recommendations   Rolling walker (2 wheels);Other (comment) (pt reports his cane disappeared or was thrown away)    Recommendations for Other Services       Precautions / Restrictions Precautions Precautions: Fall Restrictions Weight Bearing Restrictions: No     Mobility  Bed Mobility               General bed mobility comments: sitting up in chair    Transfers Overall transfer level: Needs assistance Equipment used: Rolling walker (2 wheels) Transfers: Sit to/from Stand, Bed to chair/wheelchair/BSC Sit to Stand: Supervision           General transfer comment: cues for UE placement    Ambulation/Gait Ambulation/Gait assistance: Min guard, Min assist Gait Distance (Feet): 200 Feet (260ft x2 and 31ft x2 (to/from bathroom)) Assistive device: Rolling walker (2 wheels) Gait Pattern/deviations: Step-through pattern, Decreased stride length, Wide base of support, Ataxic, Drifts right/left       General Gait Details: wide based gait, pt with increased difficulty gripping RW as he fatigued with R wrist supinating/sliding grip.  Pt abandons RW in narrow spaces despite cues to improve proximity to AD and allow staff to assist him. Pt needs dense cues for wayfinding in hallway and activity pacing, HR to 120 bpm with exertion     Balance Overall balance assessment: Needs assistance Sitting-balance support: Feet supported Sitting balance-Leahy Scale: Good     Standing balance support: Reliant on assistive device for balance Standing balance-Leahy Scale: Poor                              Cognition Arousal/Alertness: Awake/alert Behavior During Therapy: WFL for tasks assessed/performed Overall Cognitive Status: No family/caregiver present to determine baseline cognitive functioning  General Comments: Decreased awareness of safety and deficits and poor insight into his condition. Pt states "restaurant" when asked where he was,  unable to state location even with hints. Translation via in-person interpreter Graciela.        Exercises Other Exercises Other Exercises: reviewed supine BLE AROM: ankle pumps, SLR, Hip abduction, heel slides x10 reps ea with frequent cues    General Comments General comments (skin integrity, edema, etc.): SpO2 WFL on RA, HR 113-120 bpm with exertion      Pertinent Vitals/Pain Pain Assessment Pain Assessment: No/denies pain Faces Pain Scale: No hurt Pain Intervention(s): Monitored during session, Repositioned           PT Goals (current goals can now be found in the care plan section) Acute Rehab PT Goals Patient Stated Goal: To go home and work PT Goal Formulation: With patient Time For Goal Achievement: 10/14/21 Progress towards PT goals: Progressing toward goals    Frequency    Min 3X/week      PT Plan Current plan remains appropriate       AM-PAC PT "6 Clicks" Mobility   Outcome Measure  Help needed turning from your back to your side while in a flat bed without using bedrails?: A Little Help needed moving from lying on your back to sitting on the side of a flat bed without using bedrails?: A Little Help needed moving to and from a bed to a chair (including a wheelchair)?: A Little Help needed standing up from a chair using your arms (e.g., wheelchair or bedside chair)?: A Little Help needed to walk in hospital room?: A Little Help needed climbing 3-5 steps with a railing? : A Lot 6 Click Score: 17    End of Session Equipment Utilized During Treatment: Gait belt Activity Tolerance: Patient tolerated treatment well Patient left: in chair;with call bell/phone within reach;with chair alarm set Nurse Communication: Mobility status;Other (comment) (pt needs consistent cues for safety) PT Visit Diagnosis: Other abnormalities of gait and mobility (R26.89);Unsteadiness on feet (R26.81);Repeated falls (R29.6)     Time: 3007-6226 PT Time Calculation (min)  (ACUTE ONLY): 27 min  Charges:  $Gait Training: 8-22 mins $Therapeutic Exercise: 8-22 mins                     Denicia Pagliarulo P., PTA Acute Rehabilitation Services Secure Chat Preferred 9a-5:30pm Office: [place] 10/08/2021, 3:05 PM

## 2021-10-08 NOTE — Plan of Care (Signed)
  Problem: Education: Goal: Knowledge of General Education information will improve Description Including pain rating scale, medication(s)/side effects and non-pharmacologic comfort measures Outcome: Progressing   

## 2021-10-08 NOTE — Progress Notes (Signed)
Occupational Therapy Treatment Patient Details Name: Roshawn Ayala MRN: [id number] DOB: 03-04-1970 Today's Date: 10/08/2021   History of present illness 51 yo male presents to [facility] on 9/13 with bilat hand and feet numbness, LE swelling, frequent falls. CTH negative for acute findings. Workup for Wernicke's encephalopathy. Recent admission 8/23 for syncope and withdrawals. PMH: alcohol abuse, fall 1 year ago resulting in subarachnoid hemorrhage and thoracic fractures, homelessness.   OT comments  Patient in hand manipulation and grasp/release improving.  Able to self feed, place toothpaste on toothbrush, and tear sugar packs for coffee with minimal setup.  Still remains dysmetric to upper extremities and lower, but moving better with overall Min Guard at RW level.  OT will continue efforts, and the patient would benefit from post acute rehab to maximize his functional independence.     Recommendations for follow up therapy are one component of a multi-disciplinary discharge planning process, led by the attending physician.  Recommendations may be updated based on patient status, additional functional criteria and insurance authorization.    Follow Up Recommendations  Outpatient OT    Assistance Recommended at Discharge Intermittent Supervision/Assistance  Patient can return home with the following  A little help with bathing/dressing/bathroom;A little help with walking and/or transfers;Assist for transportation;Help with stairs or ramp for entrance;Direct supervision/assist for medications management;Assistance with cooking/housework;Direct supervision/assist for financial management   Equipment Recommendations  None recommended by OT    Recommendations for Other Services      Precautions / Restrictions Precautions Precautions: Fall Restrictions Weight Bearing Restrictions: No       Mobility Bed Mobility               General bed mobility comments: sitting up  at edge of the bed    Transfers Overall transfer level: Needs assistance Equipment used: Rolling walker (2 wheels) Transfers: Sit to/from Stand, Bed to chair/wheelchair/BSC Sit to Stand: Supervision     Step pivot transfers: Min guard           Balance Overall balance assessment: Needs assistance Sitting-balance support: Feet supported Sitting balance-Leahy Scale: Good     Standing balance support: Reliant on assistive device for balance Standing balance-Leahy Scale: Poor                             ADL either performed or assessed with clinical judgement   ADL   Eating/Feeding: Set up;Sitting   Grooming: Wash/dry hands;Standing;Min guard           Upper Body Dressing : Set up;Sitting   Lower Body Dressing: Sit to/from stand;Min guard   Toilet Transfer: Minimal assistance;Rolling walker (2 wheels);Regular Toilet                  Extremity/Trunk Assessment Upper Extremity Assessment RUE Deficits / Details: improving coordination to in hand manipulation and grasp and release.  Continues with dysmetria. RUE Coordination: decreased fine motor;decreased gross motor LUE Deficits / Details: improving coordination to in hand manipulation and grasp and release.  Continues with dysmetria. LUE Sensation: decreased light touch LUE Coordination: decreased fine motor;decreased gross motor   Lower Extremity Assessment Lower Extremity Assessment: Defer to PT evaluation   Cervical / Trunk Assessment Cervical / Trunk Assessment: Normal    Vision       Perception     Praxis      Cognition Arousal/Alertness: Awake/alert Behavior During Therapy: WFL for tasks assessed/performed Overall Cognitive Status: No family/caregiver  present to determine baseline cognitive functioning                                          Exercises      Shoulder Instructions       General Comments      Pertinent Vitals/ Pain       Pain  Assessment Pain Assessment: Faces Faces Pain Scale: No hurt Pain Intervention(s): Monitored during session                                                          Frequency  Min 2X/week        Progress Toward Goals  OT Goals(current goals can now be found in the care plan section)  Progress towards OT goals: Progressing toward goals  Acute Rehab OT Goals OT Goal Formulation: Patient unable to participate in goal setting Time For Goal Achievement: 10/15/21 Potential to Achieve Goals: Good  Plan Discharge plan remains appropriate    Co-evaluation                 AM-PAC OT "6 Clicks" Daily Activity     Outcome Measure   Help from another person eating meals?: None Help from another person taking care of personal grooming?: A Little Help from another person toileting, which includes using toliet, bedpan, or urinal?: A Little Help from another person bathing (including washing, rinsing, drying)?: A Little Help from another person to put on and taking off regular upper body clothing?: A Little Help from another person to put on and taking off regular lower body clothing?: A Little 6 Click Score: 19    End of Session Equipment Utilized During Treatment: Rolling walker (2 wheels)  OT Visit Diagnosis: Unsteadiness on feet (R26.81);Ataxia, unspecified (R27.0);Other symptoms and signs involving cognitive function   Activity Tolerance Patient tolerated treatment well   Patient Left in chair;with call bell/phone within reach;with chair alarm set   Nurse Communication Other (comment) (out of bed)        Time: 1000-1018 OT Time Calculation (min): 18 min  Charges: OT General Charges $OT Visit: 1 Visit OT Treatments $Self Care/Home Management : 8-22 mins  10/08/2021  RP, OTR/L  Acute Rehabilitation Services  Office:  [phone]   Suzanna Obey 10/08/2021, 10:46 AM

## 2021-10-08 NOTE — Progress Notes (Signed)
PROGRESS NOTE        PATIENT DETAILS Name: Samuel [name] Age: 51 y.o. Sex: male Date of Birth: 1970-07-10 Admit Date: 09/29/2021 Admitting Physician Emeline General, MD NUU:[id number], No Pcp Per  Brief Summary: Patient is a 51 y.o.  male with longstanding history of EtOH use-presented with approximately 1 week history of confusion,tingling/numbness involving all 4 extremities and frequent falls.  He was admitted to the hospitalist service-started on a high-dose IV thiamine-hospital course was complicated by development of alcohol withdrawal symptoms.  He was stabilized-however even after completion of 5 days of high-dose IV thiamine-his symptoms did not improve significantly.  See below for further details.    Significant events: 9/13>> admit to [facility]-frequent falls/tingling/numbness to all 4 extremities  Significant studies: 9/13>> CT head: No acute intracranial abnormalities 9/13>> CT C-spine: No fracture/dislocation 9/13>> CT T-spine: Sequelae of T7 compression and T10 Chance fractures-interval healing.  No acute osseous abnormality seen. 9/13>> CT L-spine: No acute osseous abnormality seen. 9/13>> CT chest: No acute traumatic injury.  Chronic right clavicle/scapula/bilateral rib fractures. 9/13>> CT abdomen/pelvis: No traumatic injury 9/13>> vitamin B12: 584 9/13>> vitamin B1: 33.9 (low) 9/14>> MRI brain: No acute intracranial abnormality. 9/14>> SPEP/light chain: No M spike 9/14>> HBsAg/anti-HCV Ab: Negative 9/14>> ANA positive, dsDNA slightly elevated at 13. 9/14>> A1c 5.4 9/14>> TSH: 11.067. 9/16>> FT4: 0.81 (normal) 9/21>> MRI C-spine: No acute abnormality.  Significant microbiology data: None  Procedures: None  Consults: None  Subjective: Awake-relatively alert but still confabulating-still with inappropriate answers to some questions.  Remains ataxic.  Upper extremities with dysmetria.  Objective: Vitals: Blood pressure  (!) 123/91, pulse (!) 108, temperature 99.1 F (37.3 C), temperature source Oral, resp. rate 20, height 5\' 4"  (1.626 m), weight 55.8 kg, SpO2 95 %.   Exam: Gen Exam:Alert awake-not in any distress HEENT:atraumatic, normocephalic Chest: B/L clear to auscultation anteriorly CVS:S1S2 regular Abdomen:soft non tender, non distended Extremities:no edema Neurology: Non focal Skin: no rash   Pertinent Labs/Radiology:    Latest Ref Rng & Units 10/01/2021    6:52 AM 09/29/2021    2:58 AM 09/02/2021    2:30 AM  CBC  WBC 4.0 - 10.5 K/uL 10.3  11.7  7.7   Hemoglobin 13.0 - 17.0 g/dL 09/04/2021  40.3  47.4   Hematocrit 39.0 - 52.0 % 30.9  29.0  32.9   Platelets 150 - 400 K/uL 148  141  101     Lab Results  Component Value Date   NA 134 (L) 10/06/2021   K 4.1 10/06/2021   CL 101 10/06/2021   CO2 21 (L) 10/06/2021      Assessment/Plan: Confusion-Frequent falls/unsteady gait/peripheral neuropathy: 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  Unfortunately-no response to 5 days of high-dose IV thiamine-now on oral thiamine.  Needs to stop drinking-counseled but unclear as to how much he can understand.  He continues to confabulate-is confused-and is unsafe to discharge unless he has 24/7 care.   EtOH withdrawal: Withdrawal symptoms resolved-has longstanding history of alcohol use.  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    Hypomagnesemia: Due to EtOH use-repleted.  Hypokalemia: Due to EtOH use-repleted.  Normocytic anemia: No evidence of blood loss-probably due to alcohol use-bone marrow suppression.  But given neuropathy- SPEP/UPEP/light chains-ordered and pending.  Continue to follow CBC periodically.  Thrombocytopenia: Likely due to  alcohol use-mild-follow periodically.  Hypothyroidism: Mild-have started low-dose Synthroid to see if this will improve any of his symptoms.  Recheck TSH in 3 months.  ?   Lupus: Unclear significance of slightly elevated dsDNA titers-not sure if this has any role in his neuropathic symptoms.  Will need outpatient follow-up with rheumatology.  BMI: Estimated body mass index is 21.12 kg/m as calculated from the following:   Height as of this encounter: 5\' 4"  (1.626 m).   Weight as of this encounter: 55.8 kg.   Code status:   Code Status: Full Code   DVT Prophylaxis: enoxaparin (LOVENOX) injection 40 mg Start: 09/29/21 1000 SCDs Start: 09/29/21 1324   Family Communication: None at bedside   Disposition Plan: Status is: Inpatient Remains inpatient appropriate because: Continues to have confused-confabulating-goes on tangents-not yet safe for discharge.  Likely will need to go home with 24/7 care with family or SNF.   Planned Discharge Destination: Likely SNF-social worker following.  Diet: Diet Order             Diet regular Room service appropriate? No; Fluid consistency: Thin  Diet effective now                     Antimicrobial agents: Anti-infectives (From admission, onward)    None        MEDICATIONS: Scheduled Meds:  enoxaparin (LOVENOX) injection  40 mg Subcutaneous M01U   folic acid  1 mg Oral Daily   levothyroxine  25 mcg Oral Q0600   magnesium oxide  200 mg Oral BID   multivitamin with minerals  1 tablet Oral Daily   thiamine  100 mg Oral Daily   Continuous Infusions:   PRN Meds:.acetaminophen **OR** acetaminophen, LORazepam, ondansetron **OR** ondansetron (ZOFRAN) IV, senna-docusate   I have personally reviewed following labs and imaging studies  LABORATORY DATA: CBC: No results for input(s): "WBC", "NEUTROABS", "HGB", "HCT", "MCV", "PLT" in the last 168 hours.   Basic Metabolic Panel: Recent Labs  Lab 10/02/21 0713 10/03/21 0703 10/04/21 0421 10/05/21 0739 10/06/21 0608  NA 133* 133* 136 134* 134*  K 4.1 4.1 3.9 3.6 4.1  CL 100 101 104 102 101  CO2 24 21* 25 22 21*  GLUCOSE 93 93 93 92 95  BUN 8  9 10 8 7   CREATININE 0.44* 0.50* 0.47* 0.46* 0.41*  CALCIUM 8.8* 8.9 8.8* 8.6* 8.9  MG 1.6* 1.6* 1.6* 1.4* 2.0  PHOS 3.9 4.2  --   --   --      GFR: Estimated Creatinine Clearance: 88.2 mL/min (A) (by C-G formula based on SCr of 0.41 mg/dL (L)).  Liver Function Tests: Recent Labs  Lab 10/04/21 0421 10/05/21 0739  AST 126* 102*  ALT 28 27  ALKPHOS 173* 165*  BILITOT 0.9 1.0  PROT 6.3* 6.3*  ALBUMIN 2.8* 2.8*    No results for input(s): "LIPASE", "AMYLASE" in the last 168 hours.  No results for input(s): "AMMONIA" in the last 168 hours.   Coagulation Profile: No results for input(s): "INR", "PROTIME" in the last 168 hours.   Cardiac Enzymes: No results for input(s): "CKTOTAL", "CKMB", "CKMBINDEX", "TROPONINI" in the last 168 hours.  BNP (last 3 results) No results for input(s): "PROBNP" in the last 8760 hours.  Lipid Profile: No results for input(s): "CHOL", "HDL", "LDLCALC", "TRIG", "CHOLHDL", "LDLDIRECT" in the last 72 hours.  Thyroid Function Tests: No results for input(s): "TSH", "T4TOTAL", "FREET4", "T3FREE", "THYROIDAB" in the last 72 hours.   Anemia Panel: No  results for input(s): "VITAMINB12", "FOLATE", "FERRITIN", "TIBC", "IRON", "RETICCTPCT" in the last 72 hours.   Urine analysis:    Component Value Date/Time   COLORURINE YELLOW 09/29/2021 0258   APPEARANCEUR CLEAR 09/29/2021 0258   LABSPEC 1.004 (L) 09/29/2021 0258   PHURINE 7.0 09/29/2021 0258   GLUCOSEU NEGATIVE 09/29/2021 0258   HGBUR NEGATIVE 09/29/2021 0258   BILIRUBINUR NEGATIVE 09/29/2021 0258   KETONESUR NEGATIVE 09/29/2021 0258   PROTEINUR NEGATIVE 09/29/2021 0258   NITRITE NEGATIVE 09/29/2021 0258   LEUKOCYTESUR NEGATIVE 09/29/2021 0258    Sepsis Labs: Lactic Acid, Venous    Component Value Date/Time   LATICACIDVEN 2.7 (HH) 09/29/2021 1753    MICROBIOLOGY: No results found for this or any previous visit (from the past 240 hour(s)).  RADIOLOGY STUDIES/RESULTS: MR CERVICAL  SPINE W WO CONTRAST  Result Date: 10/07/2021 CLINICAL DATA:  Initial evaluation for ataxia, cervical pathology suspected. EXAM: MRI CERVICAL SPINE WITHOUT AND WITH CONTRAST TECHNIQUE: Multiplanar and multiecho pulse sequences of the cervical spine, to include the craniocervical junction and cervicothoracic junction, were obtained without and with intravenous contrast. CONTRAST:  5.4 cc of Vueway. COMPARISON:  Prior CT from 09/29/2021. FINDINGS: Alignment: Straightening of the normal cervical lordosis. No listhesis. Vertebrae: Vertebral body height maintained without acute or chronic fracture. Bone marrow signal intensity within normal limits. No discrete or worrisome osseous lesions. No abnormal marrow edema or enhancement. Cord: Normal signal and morphology.  No abnormal enhancement. 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 Craniocervical junction normal. Paraspinous soft tissues within normal limits. Normal flow voids seen within the vertebral arteries bilaterally. Disc levels: C2-C3: Negative interspace. Mild facet hypertrophy. No canal or foraminal stenosis. C3-C4: Central disc protrusion indents the ventral thecal sac. No significant stenosis or frank cord impingement. Superimposed mild uncovertebral spurring without significant foraminal stenosis. C4-C5: Small left paracentral disc protrusion indents the ventral thecal sac. No significant spinal stenosis or cord impingement. Foramina remain patent. C5-C6: Mild disc bulge with endplate and uncovertebral spurring. Flattening and partial effacement of the ventral thecal sac without significant spinal stenosis. Mild bilateral C6 foraminal narrowing. C6-C7: Mild disc bulge with bilateral uncovertebral spurring, slightly asymmetric to the right. No significant spinal stenosis. Moderate right worse than left C7 foraminal narrowing. C7-T1: Mild disc bulge. Mild  bilateral facet hypertrophy. No significant stenosis. Visualized upper thoracic spine demonstrates no significant finding. IMPRESSION: 1. No acute abnormality within the cervical spine or spinal cord. 2. 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 Finding could contribute to ataxia. 3. Multilevel degenerative spondylosis at without significant spinal stenosis or frank cord impingement. Mild bilateral C6 foraminal narrowing, with moderate right worse than left C7 foraminal stenosis related to disc bulge and uncovertebral disease. Electronically Signed   By: Rise Mu M.D.   On: 10/07/2021 04:15     LOS: 9 days   Jeoffrey Massed, MD  [facility]    To contact the attending provider between 7A-7P or the covering provider during after hours 7P-7A, please log into the web site [email or web address] and access using universal [place] password for that web site. If you do not have the password, please call the hospital operator.  10/08/2021, 11:53 AM

## 2021-10-09 LAB — CBC
HCT: 32.4 % — ABNORMAL LOW (ref 39.0–52.0)
Hemoglobin: 11.3 g/dL — ABNORMAL LOW (ref 13.0–17.0)
MCH: 34.7 pg — ABNORMAL HIGH (ref 26.0–34.0)
MCHC: 34.9 g/dL (ref 30.0–36.0)
MCV: 99.4 fL (ref 80.0–100.0)
Platelets: 286 10*3/uL (ref 150–400)
RBC: 3.26 MIL/uL — ABNORMAL LOW (ref 4.22–5.81)
RDW: 15.7 % — ABNORMAL HIGH (ref 11.5–15.5)
WBC: 13.7 10*3/uL — ABNORMAL HIGH (ref 4.0–10.5)
nRBC: 0 % (ref 0.0–0.2)

## 2021-10-09 LAB — BASIC METABOLIC PANEL
Anion gap: 9 (ref 5–15)
BUN: 8 mg/dL (ref 6–20)
CO2: 23 mmol/L (ref 22–32)
Calcium: 9.5 mg/dL (ref 8.9–10.3)
Chloride: 104 mmol/L (ref 98–111)
Creatinine, Ser: 0.47 mg/dL — ABNORMAL LOW (ref 0.61–1.24)
GFR, Estimated: >60 mL/min (ref >60)
Glucose, Bld: 95 mg/dL (ref 70–99)
Potassium: 4.3 mmol/L (ref 3.5–5.1)
Sodium: 136 mmol/L (ref 135–145)

## 2021-10-09 LAB — MAGNESIUM: Magnesium: 1.6 mg/dL — ABNORMAL LOW (ref 1.7–2.4)

## 2021-10-09 MED ORDER — MAGNESIUM GLUCONATE 500 MG PO TABS
500.0000 mg | ORAL_TABLET | Freq: Every day | ORAL | Status: DC
  Administered 2021-10-10 – 2021-10-13 (×4): 500 mg via ORAL
  Filled 2021-10-09 (×5): qty 1

## 2021-10-09 MED ORDER — MAGNESIUM SULFATE 4 GM/100ML IV SOLN
4.0000 g | Freq: Two times a day (BID) | INTRAVENOUS | Status: AC
  Administered 2021-10-09 (×2): 4 g via INTRAVENOUS
  Filled 2021-10-09 (×2): qty 100

## 2021-10-09 NOTE — Plan of Care (Signed)
  Problem: Health Behavior/Discharge Planning: Goal: Ability to manage health-related needs will improve Outcome: Progressing   Problem: Clinical Measurements: Goal: Ability to maintain clinical measurements within normal limits will improve Outcome: Progressing Goal: Will remain free from infection Outcome: Progressing Goal: Respiratory complications will improve Outcome: Progressing   Problem: Clinical Measurements: Goal: Will remain free from infection Outcome: Progressing   Problem: Clinical Measurements: Goal: Respiratory complications will improve Outcome: Progressing   Problem: Activity: Goal: Risk for activity intolerance will decrease Outcome: Progressing   Problem: Coping: Goal: Level of anxiety will decrease Outcome: Progressing

## 2021-10-09 NOTE — Progress Notes (Signed)
PROGRESS NOTE        PATIENT DETAILS Name: Samuel [name] Age: 51 y.o. Sex: male Date of Birth: Jun 04, 1970 Admit Date: 09/29/2021 Admitting Physician Lequita Halt, MD [id number], No Pcp Per  Brief Summary: Patient is a 51 y.o.  male with longstanding history of EtOH use-presented with approximately 1 week history of confusion,tingling/numbness involving all 4 extremities and frequent falls.  He was admitted to the hospitalist service-started on a high-dose IV thiamine-hospital course was complicated by development of alcohol withdrawal symptoms.  He was stabilized CIWA protocol-however even after completion of 5 days of high-dose IV thiamine-his symptoms did not improve significantly.  See below for further details.    Significant events: 9/13>> admit to [facility]-frequent falls/tingling/numbness to all 4 extremities  Significant studies: 9/13>> CT head: No acute intracranial abnormalities 9/13>> CT C-spine: No fracture/dislocation 9/13>> CT T-spine: Sequelae of T7 compression and T10 Chance fractures-interval healing.  No acute osseous abnormality seen. 9/13>> CT L-spine: No acute osseous abnormality seen. 9/13>> CT chest: No acute traumatic injury.  Chronic right clavicle/scapula/bilateral rib fractures. 9/13>> CT abdomen/pelvis: No traumatic injury 9/13>> vitamin B12: 584 9/13>> vitamin B1: 33.9 (low) 9/14>> MRI brain: No acute intracranial abnormality. 9/14>> SPEP/light chain: No M spike 9/14>> HBsAg/anti-HCV Ab: Negative 9/14>> ANA positive, dsDNA slightly elevated at 13. 9/14>> A1c 5.4 9/14>> TSH: 11.067. 9/16>> FT4: 0.81 (normal) 9/21>> MRI C-spine: No acute abnormality.  Significant microbiology data: None  Procedures: None  Consults: None  Subjective: Seems a bit more awake/alert compared to the past few days-spoke with patient with iPad translator at bedside.  He was able to answer few of my questions appropriately.  He still  thinks he is in a restaurant and was asking for beer-however upon multiple attempts at redirection-he realized that he was in the hospital.  Thinks it is 2027-in the month is July.  He still confabulates and goes off on tangents but the severity but severely seems to be decreasing.  This MD ambulated patient at bedside-he still appears to have an ataxic gait with some upper extremity dysmetria.  Objective: Vitals: Blood pressure (!) 106/90, pulse (!) 101, temperature 98.4 F (36.9 C), temperature source Oral, resp. rate 18, height 5\' 4"  (1.626 m), weight 54.3 kg, SpO2 96 %.   Exam: Gen Exam:Anot in any distress HEENT:atraumatic, normocephalic Chest: B/L clear to auscultation anteriorly CVS:S1S2 regular Abdomen:soft non tender, non distended Extremities:no edema Neurology: Non focal Skin: no rash   Pertinent Labs/Radiology:    Latest Ref Rng & Units 10/09/2021    6:41 AM 10/01/2021    6:52 AM 09/29/2021    2:58 AM  CBC  WBC 4.0 - 10.5 K/uL 13.7  10.3  11.7   Hemoglobin 13.0 - 17.0 g/dL 11.3  10.8  10.3   Hematocrit 39.0 - 52.0 % 32.4  30.9  29.0   Platelets 150 - 400 K/uL 286  148  141     Lab Results  Component Value Date   NA 136 10/09/2021   K 4.3 10/09/2021   CL 104 10/09/2021   CO2 23 10/09/2021      Assessment/Plan: Confusion-Frequent falls/unsteady gait/peripheral neuropathy/ataxia: 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  Completed 5 days of high-dose IV thiamine-now on oral thiamine-some minimal improvement over the past few days-still confabulates and still confused.  Needs to stop drinking-unfortunately-not safe  yet to discharge unless his mentation improves further, social worker following trying to see if we can locate family in [place] that can provide 24/7 care.  EtOH withdrawal: Withdrawal symptoms resolved-has longstanding history of alcohol use.  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    Hypomagnesemia: Due to EtOH use-history of persistent hypomagnesemia in spite of multiple days of IV and oral supplementation.  Repeat 8 g of IV magnesium sulfate today-we will place on oral magnesium gluconate starting tomorrow.  Hypokalemia: Due to EtOH use-repleted.  Normocytic anemia: No evidence of blood loss-probably due to alcohol use-bone marrow suppression.  But given neuropathy- SPEP/UPEP/light chains-ordered and pending.  Continue to follow CBC periodically.  Thrombocytopenia: Likely due to alcohol use-mild-follow periodically.  Hypothyroidism: Mild-have started low-dose Synthroid to see if this will improve any of his symptoms.  Recheck TSH in 4-6 weeks  ?  Lupus: Unclear significance of slightly elevated dsDNA titers-not sure if this has any role in his neuropathic symptoms.  Will need outpatient follow-up with rheumatology.  BMI: Estimated body mass index is 20.55 kg/m as calculated from the following:   Height as of this encounter: 5\' 4"  (1.626 m).   Weight as of this encounter: 54.3 kg.   Code status:   Code Status: Full Code   DVT Prophylaxis: enoxaparin (LOVENOX) injection 40 mg Start: 09/29/21 1000 SCDs Start: 09/29/21 10/01/21   Family Communication: None at bedside   Disposition Plan: Status is: Inpatient Remains inpatient appropriate because: Continues to have confused-confabulating-goes on tangents-not yet safe for discharge.  Likely will need to go home with 24/7 care with family or SNF.   Planned Discharge Destination: Likely SNF-social worker following.  Diet: Diet Order             Diet regular Room service appropriate? No; Fluid consistency: Thin  Diet effective now                     Antimicrobial agents: Anti-infectives (From admission, onward)    None        MEDICATIONS: Scheduled Meds:  enoxaparin (LOVENOX) injection  40 mg Subcutaneous Q24H   folic acid  1 mg Oral Daily   levothyroxine  25 mcg Oral Q0600    magnesium oxide  200 mg Oral BID   multivitamin with minerals  1 tablet Oral Daily   thiamine  100 mg Oral Daily   Continuous Infusions:   PRN Meds:.acetaminophen **OR** acetaminophen, LORazepam, ondansetron **OR** ondansetron (ZOFRAN) IV, senna-docusate   I have personally reviewed following labs and imaging studies  LABORATORY DATA: CBC: Recent Labs  Lab 10/09/21 0641  WBC 13.7*  HGB 11.3*  HCT 32.4*  MCV 99.4  PLT 286     Basic Metabolic Panel: Recent Labs  Lab 10/03/21 0703 10/04/21 0421 10/05/21 0739 10/06/21 0608 10/09/21 0641  NA 133* 136 134* 134* 136  K 4.1 3.9 3.6 4.1 4.3  CL 101 104 102 101 104  CO2 21* 25 22 21* 23  GLUCOSE 93 93 92 95 95  BUN 9 10 8 7 8   CREATININE 0.50* 0.47* 0.46* 0.41* 0.47*  CALCIUM 8.9 8.8* 8.6* 8.9 9.5  MG 1.6* 1.6* 1.4* 2.0 1.6*  PHOS 4.2  --   --   --   --      GFR: Estimated Creatinine Clearance: 85.8 mL/min (A) (by C-G formula based on SCr of 0.47 mg/dL (L)).  Liver Function Tests: Recent Labs  Lab 10/04/21 0421 10/05/21 0739  AST 126* 102*  ALT 28 27  ALKPHOS 173* 165*  BILITOT 0.9 1.0  PROT 6.3* 6.3*  ALBUMIN 2.8* 2.8*    No results for input(s): "LIPASE", "AMYLASE" in the last 168 hours.  No results for input(s): "AMMONIA" in the last 168 hours.   Coagulation Profile: No results for input(s): "INR", "PROTIME" in the last 168 hours.   Cardiac Enzymes: No results for input(s): "CKTOTAL", "CKMB", "CKMBINDEX", "TROPONINI" in the last 168 hours.  BNP (last 3 results) No results for input(s): "PROBNP" in the last 8760 hours.  Lipid Profile: No results for input(s): "CHOL", "HDL", "LDLCALC", "TRIG", "CHOLHDL", "LDLDIRECT" in the last 72 hours.  Thyroid Function Tests: No results for input(s): "TSH", "T4TOTAL", "FREET4", "T3FREE", "THYROIDAB" in the last 72 hours.   Anemia Panel: No results for input(s): "VITAMINB12", "FOLATE", "FERRITIN", "TIBC", "IRON", "RETICCTPCT" in the last 72  hours.   Urine analysis:    Component Value Date/Time   COLORURINE YELLOW 09/29/2021 0258   APPEARANCEUR CLEAR 09/29/2021 0258   LABSPEC 1.004 (L) 09/29/2021 0258   PHURINE 7.0 09/29/2021 0258   GLUCOSEU NEGATIVE 09/29/2021 0258   HGBUR NEGATIVE 09/29/2021 0258   BILIRUBINUR NEGATIVE 09/29/2021 0258   KETONESUR NEGATIVE 09/29/2021 0258   PROTEINUR NEGATIVE 09/29/2021 0258   NITRITE NEGATIVE 09/29/2021 0258   LEUKOCYTESUR NEGATIVE 09/29/2021 0258    Sepsis Labs: Lactic Acid, Venous    Component Value Date/Time   LATICACIDVEN 2.7 (HH) 09/29/2021 1753    MICROBIOLOGY: No results found for this or any previous visit (from the past 240 hour(s)).  RADIOLOGY STUDIES/RESULTS: No results found.   LOS: 10 days   Jeoffrey Massed, MD  [facility]    To contact the attending provider between 7A-7P or the covering provider during after hours 7P-7A, please log into the web site [email or web address] and access using universal [place] password for that web site. If you do not have the password, please call the hospital operator.  10/09/2021, 11:46 AM

## 2021-10-10 LAB — MAGNESIUM: Magnesium: 2 mg/dL (ref 1.7–2.4)

## 2021-10-10 NOTE — Progress Notes (Signed)
PROGRESS NOTE        PATIENT DETAILS Name: Samuel [name] Age: 51 y.o. Sex: male Date of Birth: Feb 28, 1970 Admit Date: 09/29/2021 Admitting Physician Lequita Halt, MD [id number], No Pcp Per  Brief Summary: Patient is a 51 y.o.  male with longstanding history of EtOH use-presented with approximately 1 week history of confusion,tingling/numbness involving all 4 extremities and frequent falls.  He was admitted to the hospitalist service-started on a high-dose IV thiamine-hospital course was complicated by development of alcohol withdrawal symptoms.  He was stabilized CIWA protocol-however even after completion of 5 days of high-dose IV thiamine-his symptoms did not improve significantly.  See below for further details.    Significant events: 9/13>> admit to [facility]-frequent falls/tingling/numbness to all 4 extremities  Significant studies: 9/13>> CT head: No acute intracranial abnormalities 9/13>> CT C-spine: No fracture/dislocation 9/13>> CT T-spine: Sequelae of T7 compression and T10 Chance fractures-interval healing.  No acute osseous abnormality seen. 9/13>> CT L-spine: No acute osseous abnormality seen. 9/13>> CT chest: No acute traumatic injury.  Chronic right clavicle/scapula/bilateral rib fractures. 9/13>> CT abdomen/pelvis: No traumatic injury 9/13>> vitamin B12: 584 9/13>> vitamin B1: 33.9 (low) 9/14>> MRI brain: No acute intracranial abnormality. 9/14>> SPEP/light chain: No M spike 9/14>> HBsAg/anti-HCV Ab: Negative 9/14>> ANA positive, dsDNA slightly elevated at 13. 9/14>> A1c 5.4 9/14>> TSH: 11.067. 9/16>> FT4: 0.81 (normal) 9/21>> MRI C-spine: No acute abnormality.  Significant microbiology data: None  Procedures: None  Consults: None  Subjective:  Patient in bed, appears comfortable, denies any headache, no fever, no chest pain or pressure, no shortness of breath , no abdominal pain. No new focal  weakness.   Objective: Vitals: Blood pressure (!) 116/93, pulse (!) 105, temperature 98.1 F (36.7 C), temperature source Oral, resp. rate 18, height 5\' 4"  (1.626 m), weight 54.3 kg, SpO2 98 %.   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  Completed 5 days of high-dose IV thiamine-now on oral thiamine-some minimal improvement over the past few days-still confabulates and still confused.  Needs to stop drinking-unfortunately-not safe yet to discharge unless his mentation improves further, social worker following trying to see if we can locate family in [place] that can provide 24/7 care.  EtOH withdrawal: Withdrawal symptoms resolved-has longstanding history of alcohol use.  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    Hypomagnesemia: Due to EtOH use-history of persistent hypomagnesemia in spite of multiple days of IV and oral supplementation.  Repeat 8 g of IV magnesium sulfate today-we will place on oral magnesium gluconate starting tomorrow.  Hypokalemia: Due to EtOH use-repleted.  Normocytic anemia: No evidence of blood loss-probably due to alcohol use-bone marrow suppression.  But given neuropathy- SPEP/UPEP/light chains-ordered and pending.  Continue to follow CBC periodically.  Thrombocytopenia: Likely due to alcohol use-mild-follow periodically.  Hypothyroidism: Mild-have started low-dose Synthroid to see if this will improve any of his symptoms.  Recheck TSH in 4-6 weeks  ?  Lupus: Unclear significance of slightly elevated dsDNA  titers-not sure if this has any role  in his neuropathic symptoms.  Will need outpatient follow-up with rheumatology.  BMI: Estimated body mass index is 20.55 kg/m as calculated from the following:   Height as of this encounter: 5\' 4"  (1.626 m).   Weight as of this encounter: 54.3 kg.   Code status:   Code Status: Full Code   DVT Prophylaxis: enoxaparin (LOVENOX) injection 40 mg Start: 09/29/21 1000 SCDs Start: 09/29/21 7793   Family Communication: None at bedside   Disposition Plan: Status is: Inpatient Remains inpatient appropriate because: Continues to have confused-confabulating-goes on tangents-not yet safe for discharge.  Likely will need to go home with 24/7 care with family or SNF.   Planned Discharge Destination: Likely SNF-social worker following.  Diet: Diet Order             Diet regular Room service appropriate? No; Fluid consistency: Thin  Diet effective now                     Antimicrobial agents: Anti-infectives (From admission, onward)    None        MEDICATIONS: Scheduled Meds:  enoxaparin (LOVENOX) injection  40 mg Subcutaneous J03E   folic acid  1 mg Oral Daily   levothyroxine  25 mcg Oral Q0600   magnesium gluconate  500 mg Oral Daily   multivitamin with minerals  1 tablet Oral Daily   thiamine  100 mg Oral Daily   Continuous Infusions:   PRN Meds:.acetaminophen **OR** acetaminophen, LORazepam, ondansetron **OR** ondansetron (ZOFRAN) IV, senna-docusate   I have personally reviewed following labs and imaging studies  LABORATORY DATA:  Recent Labs  Lab 10/09/21 0641  WBC 13.7*  HGB 11.3*  HCT 32.4*  PLT 286  MCV 99.4  MCH 34.7*  MCHC 34.9  RDW 15.7*    Recent Labs  Lab 10/04/21 0421 10/05/21 0739 10/06/21 0608 10/09/21 0641 10/10/21 0609  NA 136 134* 134* 136  --   K 3.9 3.6 4.1 4.3  --   CL 104 102 101 104  --   CO2 25 22 21* 23  --   GLUCOSE 93 92 95 95  --   BUN 10 8 7 8   --   CREATININE 0.47* 0.46*  0.41* 0.47*  --   CALCIUM 8.8* 8.6* 8.9 9.5  --   AST 126* 102*  --   --   --   ALT 28 27  --   --   --   ALKPHOS 173* 165*  --   --   --   BILITOT 0.9 1.0  --   --   --   ALBUMIN 2.8* 2.8*  --   --   --   MG 1.6* 1.4* 2.0 1.6* 2.0      RADIOLOGY STUDIES/RESULTS: No results found.   LOS: 11 days   Signature  Lala Lund M.D on 10/10/2021 at 8:53 AM   -  To page go to [email or web address]

## 2021-10-11 NOTE — Progress Notes (Signed)
Mobility Specialist Progress Note:   10/11/21 0925  Mobility  Activity Ambulated with assistance to bathroom  Level of Assistance Contact guard assist, steadying assist  Assistive Device Front wheel walker  Distance Ambulated (ft) 30 ft  Activity Response Tolerated well  $Mobility charge 1 Mobility   Pt requesting to go to BR. Required steadying assist with RW. Pt back sitting EOB with all needs met, bed alarm on.   [place] Numbers Acute Rehab Secure Chat or Office Phone: [phone]

## 2021-10-11 NOTE — TOC Progression Note (Signed)
Transition of Care [facility]) - Progression Note    Patient Details  Name: Samuel [name] MRN: [id number] Date of Birth: 12/06/1970  Transition of [facility]) CM/SW [place], LCSW Phone Number: 10/11/2021, 12:11 PM  Clinical Narrative:    TOC continuing to follow. No current disposition options for full time supervision for an uninsured patient at the age of 89.   Expected Discharge Plan: Home/Self Care Barriers to Discharge: Continued Medical Work up  Expected Discharge Plan and Services Expected Discharge Plan: Home/Self Care In-house Referral: Clinical Social Work Discharge Planning Services: CM Consult   Living arrangements for the past 2 months: [place]: [place] (charity) Date [place]: 10/05/21 Time [place]: 1230 Representative spoke with at [place]: Amy   Social Determinants of Health ([place]) Interventions Food Insecurity Interventions: Inpatient TOC Housing Interventions: Inpatient TOC Transportation Interventions: Inpatient TOC Utilities Interventions: Intervention Not Indicated  Readmission Risk Interventions    07/21/2020    2:26 PM  Readmission Risk Prevention Plan  Medication Screening Complete  Transportation Screening Complete

## 2021-10-11 NOTE — Progress Notes (Signed)
PROGRESS NOTE        PATIENT DETAILS Name: Samuel [name] [facility] [name] Age: 51 y.o. Sex: male Date of Birth: February 13, 1970 Admit Date: 09/29/2021 Admitting Physician Emeline General, MD [id number], No Pcp Per  Brief Summary: Patient is a 51 y.o.  male with longstanding history of EtOH use-presented with approximately 1 week history of confusion,tingling/numbness involving all 4 extremities and frequent falls.  He was admitted to the hospitalist service-started on a high-dose IV thiamine-hospital course was complicated by development of alcohol withdrawal symptoms.  He was stabilized CIWA protocol-however even after completion of 5 days of high-dose IV thiamine-his symptoms did not improve significantly.  See below for further details.    Significant events: 9/13>> admit to [facility]-frequent falls/tingling/numbness to all 4 extremities  Significant studies: 9/13>> CT head: No acute intracranial abnormalities 9/13>> CT C-spine: No fracture/dislocation 9/13>> CT T-spine: Sequelae of T7 compression and T10 Chance fractures-interval healing.  No acute osseous abnormality seen. 9/13>> CT L-spine: No acute osseous abnormality seen. 9/13>> CT chest: No acute traumatic injury.  Chronic right clavicle/scapula/bilateral rib fractures. 9/13>> CT abdomen/pelvis: No traumatic injury 9/13>> vitamin B12: 584 9/13>> vitamin B1: 33.9 (low) 9/14>> MRI brain: No acute intracranial abnormality. 9/14>> SPEP/light chain: No M spike 9/14>> HBsAg/anti-HCV Ab: Negative 9/14>> ANA positive, dsDNA slightly elevated at 13. 9/14>> A1c 5.4 9/14>> TSH: 11.067. 9/16>> FT4: 0.81 (normal) 9/21>> MRI C-spine: No acute abnormality.  Significant microbiology data: None  Procedures: None  Consults: None  Subjective:  Patient in bed, appears comfortable, denies any headache, no fever, no chest pain or pressure, no shortness of breath , no abdominal pain. No new focal  weakness.   Objective: Vitals: Blood pressure (!) 124/92, pulse (!) 108, temperature 99.1 F (37.3 C), temperature source Oral, resp. rate 17, height 5\' 4"  (1.626 m), weight 54.3 kg, SpO2 92 %.   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  Completed 5 days of high-dose IV thiamine-now on oral thiamine-some minimal improvement over the past few days-still confabulates and still confused.  Needs to stop drinking-unfortunately-not safe yet to discharge unless his mentation improves further, social worker following trying to see if we can locate family in [place] that can provide 24/7 care.  EtOH withdrawal: Withdrawal symptoms resolved-has longstanding history of alcohol use.  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    Hypomagnesemia: Due to EtOH use-history of persistent hypomagnesemia in spite of multiple days of IV and oral supplementation.  Repeat 8 g of IV magnesium sulfate today-we will place on oral magnesium gluconate starting tomorrow.  Hypokalemia: Due to EtOH use-repleted.  Normocytic anemia: No evidence of blood loss-probably due to alcohol use-bone marrow suppression.  But given neuropathy- SPEP/UPEP/light chains-ordered and pending.  Continue to follow CBC periodically.  Thrombocytopenia: Likely due to alcohol use-mild-follow periodically.  Hypothyroidism: Mild-have started low-dose Synthroid to see if this will improve any of his symptoms.  Recheck TSH in 4-6 weeks  ?  Lupus: Unclear significance of slightly elevated dsDNA  titers-not sure if this has any  role in his neuropathic symptoms.  Will need outpatient follow-up with rheumatology.  BMI: Estimated body mass index is 20.55 kg/m as calculated from the following:   Height as of this encounter: 5\' 4"  (1.626 m).   Weight as of this encounter: 54.3 kg.   Code status:   Code Status: Full Code   DVT Prophylaxis: enoxaparin (LOVENOX) injection 40 mg Start: 09/29/21 1000 SCDs Start: 09/29/21 4665   Family Communication: None at bedside   Disposition Plan: Status is: Inpatient Remains inpatient appropriate because: Continues to have confused-confabulating-goes on tangents-not yet safe for discharge.  Likely will need to go home with 24/7 care with family or SNF.   Planned Discharge Destination: Likely SNF-social worker following.  Diet: Diet Order             Diet regular Room service appropriate? No; Fluid consistency: Thin  Diet effective now                     Antimicrobial agents: Anti-infectives (From admission, onward)    None        MEDICATIONS: Scheduled Meds:  enoxaparin (LOVENOX) injection  40 mg Subcutaneous L93T   folic acid  1 mg Oral Daily   levothyroxine  25 mcg Oral Q0600   magnesium gluconate  500 mg Oral Daily   multivitamin with minerals  1 tablet Oral Daily   thiamine  100 mg Oral Daily   Continuous Infusions:   PRN Meds:.acetaminophen **OR** acetaminophen, LORazepam, ondansetron **OR** ondansetron (ZOFRAN) IV, senna-docusate   I have personally reviewed following labs and imaging studies  LABORATORY DATA:  Recent Labs  Lab 10/09/21 0641  WBC 13.7*  HGB 11.3*  HCT 32.4*  PLT 286  MCV 99.4  MCH 34.7*  MCHC 34.9  RDW 15.7*    Recent Labs  Lab 10/05/21 0739 10/06/21 0608 10/09/21 0641 10/10/21 0609  NA 134* 134* 136  --   K 3.6 4.1 4.3  --   CL 102 101 104  --   CO2 22 21* 23  --   GLUCOSE 92 95 95  --   BUN 8 7 8   --   CREATININE 0.46* 0.41* 0.47*  --   CALCIUM 8.6* 8.9 9.5  --    AST 102*  --   --   --   ALT 27  --   --   --   ALKPHOS 165*  --   --   --   BILITOT 1.0  --   --   --   ALBUMIN 2.8*  --   --   --   MG 1.4* 2.0 1.6* 2.0      RADIOLOGY STUDIES/RESULTS: No results found.   LOS: 12 days   Signature  Lala Lund M.D on 10/11/2021 at 8:56 AM   -  To page go to [email or web address]

## 2021-10-12 NOTE — Progress Notes (Signed)
Physical Therapy Treatment Patient Details Name: Samuel [name] MRN: [id number] DOB: August 07, 1970 Today's Date: 10/12/2021   History of Present Illness 51 yo male presents to [facility] on 9/13 with bilat hand and feet numbness, LE swelling, frequent falls. CTH negative for acute findings. Workup for Wernicke's encephalopathy. Recent admission 8/23 for syncope and withdrawals. PMH: alcohol abuse, fall 1 year ago resulting in subarachnoid hemorrhage and thoracic fractures, homelessness.    PT Comments    Pt received in supine, agreeable to therapy session with emphasis on safety with transfers and gait using RW and stair negotiation. Pt needing up to maxA for single step-up due to BLE buckling and up to minA for gait using RW. Pt continues to c/o BLE and LUE>RUE numbness/discomfort. Pt agreeable to sit up in chair to eat lunch, chair alarm on for safety. Pt continues to benefit from PT services to progress toward functional mobility goals.    Recommendations for follow up therapy are one component of a multi-disciplinary discharge planning process, led by the attending physician.  Recommendations may be updated based on patient status, additional functional criteria and insurance authorization.  Follow Up Recommendations  Outpatient PT (vs HHPT; pt needs constant supervision/assist currently for safety)     Assistance Recommended at Discharge Frequent or constant Supervision/Assistance  Patient can return home with the following A little help with walking and/or transfers;A little help with bathing/dressing/bathroom;Direct supervision/assist for medications management;Direct supervision/assist for financial management;Assist for transportation;Help with stairs or ramp for entrance   Equipment Recommendations  Rolling walker (2 wheels);Other (comment) (pt reports his cane disappeared or was thrown away)    Recommendations for Other Services       Precautions / Restrictions  Precautions Precautions: Fall Restrictions Weight Bearing Restrictions: No     Mobility  Bed Mobility Overal bed mobility: Needs Assistance Bed Mobility: Supine to Sit     Supine to sit: Supervision     General bed mobility comments: increased time to perform    Transfers Overall transfer level: Needs assistance Equipment used: Rolling walker (2 wheels) Transfers: Sit to/from Stand, Bed to chair/wheelchair/BSC Sit to Stand: Supervision           General transfer comment: cues for UE placement and safety; to [facility] and chair heights; cues needed not to abandon RW prior to getting close to seated surface    Ambulation/Gait Ambulation/Gait assistance: Min guard, Min assist Gait Distance (Feet): 100 Feet (x3 trials with breaks) Assistive device: Rolling walker (2 wheels) Gait Pattern/deviations: Step-through pattern, Decreased stride length, Wide base of support, Ataxic, Drifts right/left Gait velocity: decr     General Gait Details: wide based gait, pt with increased difficulty gripping RW as he fatigued with sliding grip.  Pt abandons RW in narrow spaces despite cues to improve proximity to AD and allow staff to assist him. Pt needs dense cues for wayfinding in hallway and activity pacing, HR to 120 bpm with exertion, SpO2 WFL   Stairs Stairs: Yes Stairs assistance: Max assist, +2 safety/equipment Stair Management: One rail Right (LUE HHA) Number of Stairs: 1 General stair comments: attempted to have pt perform single step in stairwell, pt with BLE buckling once stepping up and needed maxA along with railing to prevent loss of balance and steady. Deferred further attempts after this.   Wheelchair Mobility    Modified Rankin (Stroke Patients Only)       Balance Overall balance assessment: Needs assistance Sitting-balance support: Feet supported Sitting balance-Leahy Scale: Good  Standing balance support: Reliant on assistive device for balance Standing  balance-Leahy Scale: Poor Standing balance comment: minA for dynamic tasks without RW, very unsteady attempting foot taps/step-up needing up to maxA                            Cognition Arousal/Alertness: Awake/alert Behavior During Therapy: WFL for tasks assessed/performed Overall Cognitive Status: No family/caregiver present to determine baseline cognitive functioning        General Comments: Decreased awareness of safety and deficits and poor insight into his condition. Pt states "I don't know" when asked where he was, unable to state location even with hints. Translation via in-person interpreter Samuel [name]. Pt with limited carryover of safety instructions tends to abandon RW in room despite frequent cues to keep using it.        Exercises      General Comments General comments (skin integrity, edema, etc.): BP stable supine to seated posture but diastolic BP elevated as high as 138/99 (seated); HR/SpO2 [facility] no dyspnea on exertion.      Pertinent Vitals/Pain Pain Assessment Pain Assessment: Faces Faces Pain Scale: Hurts little more Pain Location: B hands and feet (legs below knees), trunk/rib soreness (chronic rib fx) Pain Descriptors / Indicators: Discomfort, Numbness, Guarding Pain Intervention(s): Monitored during session, Repositioned     PT Goals (current goals can now be found in the care plan section) Acute Rehab PT Goals Patient Stated Goal: To go home and work PT Goal Formulation: With patient Time For Goal Achievement: 10/14/21 Progress towards PT goals: Progressing toward goals    Frequency    Min 3X/week      PT Plan Current plan remains appropriate       AM-PAC PT "6 Clicks" Mobility   Outcome Measure  Help needed turning from your back to your side while in a flat bed without using bedrails?: A Little Help needed moving from lying on your back to sitting on the side of a flat bed without using bedrails?: A Little Help needed moving to and  from a bed to a chair (including a wheelchair)?: A Little Help needed standing up from a chair using your arms (e.g., wheelchair or bedside chair)?: A Little Help needed to walk in hospital room?: A Lot (mod safety cues) Help needed climbing 3-5 steps with a railing? : Total (attempted but unable) 6 Click Score: 15    End of Session Equipment Utilized During Treatment: Gait belt Activity Tolerance: Patient tolerated treatment well Patient left: in chair;with call bell/phone within reach;with chair alarm set (pt set up to eat lunch with tray in front of him) Nurse Communication: Mobility status;Other (comment) (pt needs consistent cues for safety) PT Visit Diagnosis: Other abnormalities of gait and mobility (R26.89);Unsteadiness on feet (R26.81);Repeated falls (R29.6)     Time: 2952-8413 PT Time Calculation (min) (ACUTE ONLY): 37 min  Charges:  $Gait Training: 23-37 mins                     Samuel [name] P., PTA Acute Rehabilitation Services Secure Chat Preferred 9a-5:30pm Office: [phone]    Dorathy Kinsman [facility] 10/12/2021, 3:23 PM

## 2021-10-12 NOTE — Progress Notes (Signed)
PROGRESS NOTE        PATIENT DETAILS Name: Samuel [name] Age: 51 y.o. Sex: male Date of Birth: February 16, 1970 Admit Date: 09/29/2021 Admitting Physician Lequita Halt, MD ZOX:[id number], No Pcp Per  Brief Summary: Patient is a 51 y.o.  male with longstanding history of EtOH use-presented with approximately 1 week history of confusion,tingling/numbness involving all 4 extremities and frequent falls.  He was admitted to the hospitalist service-started on a high-dose IV thiamine-hospital course was complicated by development of alcohol withdrawal symptoms.  He was stabilized CIWA protocol-however even after completion of 5 days of high-dose IV thiamine-his symptoms did not improve significantly.  See below for further details.    Significant events: 9/13>> admit to [facility]-frequent falls/tingling/numbness to all 4 extremities  Significant studies: 9/13>> CT head: No acute intracranial abnormalities 9/13>> CT C-spine: No fracture/dislocation 9/13>> CT T-spine: Sequelae of T7 compression and T10 Chance fractures-interval healing.  No acute osseous abnormality seen. 9/13>> CT L-spine: No acute osseous abnormality seen. 9/13>> CT chest: No acute traumatic injury.  Chronic right clavicle/scapula/bilateral rib fractures. 9/13>> CT abdomen/pelvis: No traumatic injury 9/13>> vitamin B12: 584 9/13>> vitamin B1: 33.9 (low) 9/14>> MRI brain: No acute intracranial abnormality. 9/14>> SPEP/light chain: No M spike 9/14>> HBsAg/anti-HCV Ab: Negative 9/14>> ANA positive, dsDNA slightly elevated at 13. 9/14>> A1c 5.4 9/14>> TSH: 11.067. 9/16>> FT4: 0.81 (normal) 9/21>> MRI C-spine: No acute abnormality.  Significant microbiology data: None  Procedures: None  Consults: None  Subjective:  Patient in bed, appears comfortable, denies any headache, no fever, no chest pain or pressure, no shortness of breath , no abdominal pain. No new focal  weakness.   Objective: Vitals: Blood pressure 123/86, pulse 95, temperature 98.1 F (36.7 C), temperature source Oral, resp. rate 18, height 5\' 4"  (1.626 m), weight 54.3 kg, SpO2 95 %.   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  Completed 5 days of high-dose IV thiamine-now on oral thiamine-some minimal improvement over the past few days-still confabulates and still confused.  Needs to stop drinking-unfortunately-not safe yet to discharge unless his mentation improves further, social worker following trying to see if we can locate family in [place] that can provide 24/7 care.  EtOH withdrawal: Withdrawal symptoms resolved-has longstanding history of alcohol use.  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    Hypomagnesemia: Due to EtOH use-history of persistent hypomagnesemia in spite of multiple days of IV and oral supplementation.  Repeat 8 g of IV magnesium sulfate today-we will place on oral magnesium gluconate starting tomorrow.  Hypokalemia: Due to EtOH use-repleted.  Normocytic anemia: No evidence of blood loss-probably due to alcohol use-bone marrow suppression.  But given neuropathy- SPEP/UPEP/light chains-ordered and pending.  Continue to follow CBC periodically.  Thrombocytopenia: Likely due to alcohol use-mild-follow periodically.  Hypothyroidism: Mild-have started low-dose Synthroid to see if this will improve any of his symptoms.  Recheck TSH in 4-6 weeks  ?  Lupus: Unclear significance of slightly elevated dsDNA titers-not sure  if this has any role in  his neuropathic symptoms.  Will need outpatient follow-up with rheumatology.  BMI: Estimated body mass index is 20.55 kg/m as calculated from the following:   Height as of this encounter: 5\' 4"  (1.626 m).   Weight as of this encounter: 54.3 kg.   Code status:   Code Status: Full Code   DVT Prophylaxis: enoxaparin (LOVENOX) injection 40 mg Start: 09/29/21 1000 SCDs Start: 09/29/21 10/01/21   Family Communication: None at bedside   Disposition Plan: Status is: Inpatient Remains inpatient appropriate because: Continues to have confused-confabulating-goes on tangents-not yet safe for discharge.  Likely will need to go home with 24/7 care with family or SNF.   Planned Discharge Destination: Likely SNF-social worker following.  Diet: Diet Order             Diet regular Room service appropriate? No; Fluid consistency: Thin  Diet effective now                     Antimicrobial agents: Anti-infectives (From admission, onward)    None        MEDICATIONS: Scheduled Meds:  enoxaparin (LOVENOX) injection  40 mg Subcutaneous Q24H   folic acid  1 mg Oral Daily   levothyroxine  25 mcg Oral Q0600   magnesium gluconate  500 mg Oral Daily   multivitamin with minerals  1 tablet Oral Daily   thiamine  100 mg Oral Daily   Continuous Infusions:   PRN Meds:.acetaminophen **OR** acetaminophen, LORazepam, ondansetron **OR** ondansetron (ZOFRAN) IV, senna-docusate   I have personally reviewed following labs and imaging studies  LABORATORY DATA:  Recent Labs  Lab 10/09/21 0641  WBC 13.7*  HGB 11.3*  HCT 32.4*  PLT 286  MCV 99.4  MCH 34.7*  MCHC 34.9  RDW 15.7*    Recent Labs  Lab 10/06/21 0608 10/09/21 0641 10/10/21 0609  NA 134* 136  --   K 4.1 4.3  --   CL 101 104  --   CO2 21* 23  --   GLUCOSE 95 95  --   BUN 7 8  --   CREATININE 0.41* 0.47*  --   CALCIUM 8.9 9.5  --   MG 2.0 1.6* 2.0    RADIOLOGY STUDIES/RESULTS: No results  found.   LOS: 13 days   Signature  10/12/21 M.D on 10/12/2021 at 11:08 AM   -  To page go to [email or web address]

## 2021-10-13 LAB — BASIC METABOLIC PANEL
Anion gap: 7 (ref 5–15)
BUN: 9 mg/dL (ref 6–20)
CO2: 24 mmol/L (ref 22–32)
Calcium: 9.2 mg/dL (ref 8.9–10.3)
Chloride: 105 mmol/L (ref 98–111)
Creatinine, Ser: 0.46 mg/dL — ABNORMAL LOW (ref 0.61–1.24)
GFR, Estimated: >60 mL/min (ref >60)
Glucose, Bld: 94 mg/dL (ref 70–99)
Potassium: 4.2 mmol/L (ref 3.5–5.1)
Sodium: 136 mmol/L (ref 135–145)

## 2021-10-13 LAB — CBC
HCT: 34.3 % — ABNORMAL LOW (ref 39.0–52.0)
Hemoglobin: 12.1 g/dL — ABNORMAL LOW (ref 13.0–17.0)
MCH: 33.9 pg (ref 26.0–34.0)
MCHC: 35.3 g/dL (ref 30.0–36.0)
MCV: 96.1 fL (ref 80.0–100.0)
Platelets: 326 10*3/uL (ref 150–400)
RBC: 3.57 MIL/uL — ABNORMAL LOW (ref 4.22–5.81)
RDW: 14.7 % (ref 11.5–15.5)
WBC: 14.8 10*3/uL — ABNORMAL HIGH (ref 4.0–10.5)
nRBC: 0 % (ref 0.0–0.2)

## 2021-10-13 LAB — MAGNESIUM: Magnesium: 1.6 mg/dL — ABNORMAL LOW (ref 1.7–2.4)

## 2021-10-13 MED ORDER — MAGNESIUM SULFATE 4 GM/100ML IV SOLN
4.0000 g | Freq: Once | INTRAVENOUS | Status: AC
  Administered 2021-10-13: 4 g via INTRAVENOUS
  Filled 2021-10-13: qty 100

## 2021-10-13 NOTE — Progress Notes (Signed)
Occupational Therapy Treatment Patient Details Name: Samuel [name] MRN: [id number] DOB: April 27, 1970 Today's Date: 10/13/2021   History of present illness 51 yo male presents to [facility] on 9/13 with bilat hand and feet numbness, LE swelling, frequent falls. CTH negative for acute findings. Workup for Wernicke's encephalopathy. Recent admission 8/23 for syncope and withdrawals. PMH: alcohol abuse, fall 1 year ago resulting in subarachnoid hemorrhage and thoracic fractures, homelessness.   OT comments  Patient has made progress with patient focused goals.  He continues to struggle with LUE in hand manipulation and dysmetria.  Patient is able to participate with his self care at setup to [facility], primarily due to decreased balance. Patient is probably reaching max functional potential in the acute setting.  Continue to recommend outpatient trial for L upper extremity rehab.  OT will continue efforts in the acute setting.  He did use a RW at baseline, and lived with friends, who did assist him.      Recommendations for follow up therapy are one component of a multi-disciplinary discharge planning process, led by the attending physician.  Recommendations may be updated based on patient status, additional functional criteria and insurance authorization.    Follow Up Recommendations  Outpatient OT    Assistance Recommended at Discharge Intermittent Supervision/Assistance  Patient can return home with the following  A little help with bathing/dressing/bathroom;A little help with walking and/or transfers;Assist for transportation;Help with stairs or ramp for entrance;Direct supervision/assist for medications management;Assistance with cooking/housework;Direct supervision/assist for financial management   Equipment Recommendations  None recommended by OT    Recommendations for Other Services      Precautions / Restrictions Precautions Precautions: Fall Restrictions Weight Bearing  Restrictions: No       Mobility Bed Mobility Overal bed mobility: Modified Independent                  Transfers Overall transfer level: Needs assistance Equipment used: 1 person hand held assist Transfers: Sit to/from Stand Sit to Stand: Supervision     Step pivot transfers: Min guard           Balance Overall balance assessment: Needs assistance Sitting-balance support: Feet supported Sitting balance-Leahy Scale: Good     Standing balance support: Single extremity supported Standing balance-Leahy Scale: Poor                             ADL either performed or assessed with clinical judgement   ADL       Grooming: Wash/dry hands;Standing;Supervision/safety;Wash/dry face Grooming Details (indicate cue type and reason): setup for oral care, unable to place toothpaste, uses R arm to brush teeth.         Upper Body Dressing : Set up;Sitting   Lower Body Dressing: Sit to/from stand;Min guard   Toilet Transfer: Minimal assistance Toilet Transfer Details (indicate cue type and reason): HHA for in room mobility this date                Extremity/Trunk Assessment Upper Extremity Assessment Upper Extremity Assessment: LUE deficits/detail RUE Deficits / Details: improving coordination to in hand manipulation and grasp and release.  Continues with dysmetria, but able to use R UE functionally. LUE Deficits / Details: Patient is using the L UE as more of a stabilize assist.  He can grasp, but it is very weak, and increased spliiage and dropping items with L hand/arm LUE Coordination: decreased gross motor;decreased fine motor  Lower Extremity Assessment Lower Extremity Assessment: Defer to PT evaluation   Cervical / Trunk Assessment Cervical / Trunk Assessment: Normal    Vision       Perception     Praxis      Cognition Arousal/Alertness: Awake/alert Behavior During Therapy: WFL for tasks assessed/performed Overall Cognitive  Status: Within Functional Limits for tasks assessed                                          Exercises      Shoulder Instructions       General Comments      Pertinent Vitals/ Pain       Pain Assessment Pain Assessment: Faces Faces Pain Scale: No hurt Pain Intervention(s): Monitored during session                                                          Frequency  Min 1X/week        Progress Toward Goals  OT Goals(current goals can now be found in the care plan section)  Progress towards OT goals: Progressing toward goals  Acute Rehab OT Goals OT Goal Formulation: Patient unable to participate in goal setting Time For Goal Achievement: 10/27/21 Potential to Achieve Goals: Fair ADL Goals Pt Will Perform Grooming: with set-up;standing Pt Will Perform Upper Body Bathing: with modified independence;sitting Pt Will Perform Lower Body Bathing: with supervision;sit to/from stand Pt Will Perform Upper Body Dressing: with modified independence;sitting Pt Will Perform Lower Body Dressing: with supervision;sit to/from stand  Plan Frequency needs to be updated    Co-evaluation                 AM-PAC OT "6 Clicks" Daily Activity     Outcome Measure   Help from another person eating meals?: None Help from another person taking care of personal grooming?: A Little Help from another person toileting, which includes using toliet, bedpan, or urinal?: A Little Help from another person bathing (including washing, rinsing, drying)?: A Little Help from another person to put on and taking off regular upper body clothing?: None Help from another person to put on and taking off regular lower body clothing?: A Little 6 Click Score: 20    End of Session    OT Visit Diagnosis: Unsteadiness on feet (R26.81);Ataxia, unspecified (R27.0);Other symptoms and signs involving cognitive function   Activity Tolerance Patient tolerated  treatment well   Patient Left in bed;with call bell/phone within reach   Nurse Communication Mobility status        Time: 1211-1228 OT Time Calculation (min): 17 min  Charges: OT General Charges $OT Visit: 1 Visit OT Treatments $Self Care/Home Management : 8-22 mins  10/13/2021  RP, OTR/L  Acute Rehabilitation Services  Office:  [phone]   Metta Clines 10/13/2021, 12:54 PM

## 2021-10-13 NOTE — Plan of Care (Signed)

## 2021-10-13 NOTE — Progress Notes (Signed)
Consult received for IV placement. This patient has pulled out 7+ PIVs. At this time patient has not order infusions. Just 1 IV ativan which has not been administered since 9/24. Recommended that PIV not be placed at this time. D/t scaring of the veins and vein preservation. Instructed nurse to contact MD as needed and to reconsult IV team for any other needs. RN VU. Fran Lowes, RN VAST

## 2021-10-13 NOTE — Progress Notes (Signed)
PROGRESS NOTE        PATIENT DETAILS Name: Samuel [name] Age: 51 y.o. Sex: male Date of Birth: [birth year] Admit Date: 09/29/2021 Admitting Physician Lequita Halt, MD ZOX:[id number], No Pcp Per  Brief Summary: Patient is a 51 y.o.  male with longstanding history of EtOH use-presented with approximately 1 week history of confusion,tingling/numbness involving all 4 extremities and frequent falls.  He was admitted to the hospitalist service-started on a high-dose IV thiamine-hospital course was complicated by development of alcohol withdrawal symptoms.  He was stabilized CIWA protocol-however even after completion of 5 days of high-dose IV thiamine-his symptoms did not improve significantly.  See below for further details.    Significant events: 9/13>> admit to [facility]-frequent falls/tingling/numbness to all 4 extremities  Significant studies: 9/13>> CT head: No acute intracranial abnormalities 9/13>> CT C-spine: No fracture/dislocation 9/13>> CT T-spine: Sequelae of T7 compression and T10 Chance fractures-interval healing.  No acute osseous abnormality seen. 9/13>> CT L-spine: No acute osseous abnormality seen. 9/13>> CT chest: No acute traumatic injury.  Chronic right clavicle/scapula/bilateral rib fractures. 9/13>> CT abdomen/pelvis: No traumatic injury 9/13>> vitamin B12: 584 9/13>> vitamin B1: 33.9 (low) 9/14>> MRI brain: No acute intracranial abnormality. 9/14>> SPEP/light chain: No M spike 9/14>> HBsAg/anti-HCV Ab: Negative 9/14>> ANA positive, dsDNA slightly elevated at 13. 9/14>> A1c 5.4 9/14>> TSH: 11.067. 9/16>> FT4: 0.81 (normal) 9/21>> MRI C-spine: No acute abnormality.  Significant microbiology data: None  Procedures: None  Consults: None  Subjective:  Patient in bed, appears comfortable, denies any headache, no fever, no chest pain or pressure, no shortness of breath , no abdominal pain. No new focal  weakness.   Objective: Vitals: Blood pressure 125/86, pulse 100, temperature 98.2 F (36.8 C), temperature source Oral, resp. rate 16, height 5\' 4"  (1.626 m), weight 54.3 kg, SpO2 95 %.   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  Completed 5 days of high-dose IV thiamine-now on oral thiamine-some minimal improvement over the past few days-still confabulates and still confused.  Needs to stop drinking-unfortunately-not safe yet to discharge unless his mentation improves further, social worker following trying to see if we can locate family in [place] that can provide 24/7 care.  EtOH withdrawal: Withdrawal symptoms resolved-has longstanding history of alcohol use.  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    Hypomagnesemia: replaced  Hypokalemia: Due to EtOH use-repleted.  Normocytic anemia: No evidence of blood loss-probably due to alcohol use-bone marrow suppression.  But given neuropathy- SPEP/UPEP/light chains-ordered and pending.  Continue to follow CBC periodically.  Thrombocytopenia: Likely due to alcohol use-mild-follow periodically.  Hypothyroidism: Mild-have started low-dose Synthroid to see if this will improve any of his symptoms.  Recheck TSH in 4-6 weeks  ?  Lupus: Unclear significance of slightly elevated dsDNA titers-not sure if this has any role in his neuropathic symptoms.  Will need outpatient follow-up with rheumatology.  BMI: Estimated body mass index is 20.55 kg/m as calculated from the following:   Height as of  this encounter: 5\' 4"  (  1.626 m).   Weight as of this encounter: 54.3 kg.   Code status:   Code Status: Full Code   DVT Prophylaxis: enoxaparin (LOVENOX) injection 40 mg Start: 09/29/21 1000 SCDs Start: 09/29/21 7494   Family Communication: None at bedside   Disposition Plan: Status is: Inpatient Remains inpatient appropriate because: Continues to have confused-confabulating-goes on tangents-not yet safe for discharge.  Likely will need to go home with 24/7 care with family or SNF.   Planned Discharge Destination: Likely SNF-social worker following.  Diet: Diet Order             Diet regular Room service appropriate? No; Fluid consistency: Thin  Diet effective now                     Antimicrobial agents: Anti-infectives (From admission, onward)    None        MEDICATIONS: Scheduled Meds:  enoxaparin (LOVENOX) injection  40 mg Subcutaneous Q24H   folic acid  1 mg Oral Daily   levothyroxine  25 mcg Oral Q0600   magnesium gluconate  500 mg Oral Daily   multivitamin with minerals  1 tablet Oral Daily   thiamine  100 mg Oral Daily   Continuous Infusions:   PRN Meds:.acetaminophen **OR** acetaminophen, LORazepam, ondansetron **OR** ondansetron (ZOFRAN) IV, senna-docusate   I have personally reviewed following labs and imaging studies  LABORATORY DATA:  Recent Labs  Lab 10/09/21 0641 10/13/21 0700  WBC 13.7* 14.8*  HGB 11.3* 12.1*  HCT 32.4* 34.3*  PLT 286 326  MCV 99.4 96.1  MCH 34.7* 33.9  MCHC 34.9 35.3  RDW 15.7* 14.7    Recent Labs  Lab 10/09/21 0641 10/10/21 0609 10/13/21 0700  NA 136  --  136  K 4.3  --  4.2  CL 104  --  105  CO2 23  --  24  GLUCOSE 95  --  94  BUN 8  --  9  CREATININE 0.47*  --  0.46*  CALCIUM 9.5  --  9.2  MG 1.6* 2.0 1.6*    RADIOLOGY STUDIES/RESULTS: No results found.   LOS: 14 days   Signature  Susa Raring M.D on 10/13/2021 at 10:53 AM   -  To page go to [email or web address]

## 2021-10-14 LAB — MAGNESIUM: Magnesium: 1.7 mg/dL (ref 1.7–2.4)

## 2021-10-14 MED ORDER — MAGNESIUM OXIDE -MG SUPPLEMENT 400 (240 MG) MG PO TABS: 800.0000 mg | ORAL_TABLET | Freq: Every day | ORAL | Status: DC

## 2021-10-14 MED ORDER — MAGNESIUM OXIDE -MG SUPPLEMENT 400 (240 MG) MG PO TABS
800.0000 mg | ORAL_TABLET | Freq: Two times a day (BID) | ORAL | Status: DC
  Administered 2021-10-14 – 2021-10-15 (×4): 800 mg via ORAL
  Filled 2021-10-14 (×4): qty 2

## 2021-10-14 NOTE — Progress Notes (Signed)
Mobility Specialist Criteria Algorithm Info.   10/14/21 1427  Mobility  Activity Dangled on edge of bed;Refused mobility  Range of Motion/Exercises Active;All extremities  Level of Assistance Modified independent, requires aide device or extra time  Assistive Device None  Activity Response Tolerated well   Patient received in supine. Declining mobility at this time due to working with PT not long ago. Lunch tray was just delivered as well. Patient dangled EOB and was set-up for lunch. Will f/u as time permits. Was left dangling with all needs met, call bell in reach.   Samuel [name], [place], [place]  ACZYS:[phone] Office: [phone]

## 2021-10-14 NOTE — Progress Notes (Signed)
Physical Therapy Treatment Patient Details Name: Samuel [name] MRN: [id number] DOB: 10-04-1970 Today's Date: 10/14/2021   History of Present Illness 51 yo male presents to [facility] on 9/13 with bilat hand and feet numbness, LE swelling, frequent falls. CTH negative for acute findings. Workup for Wernicke's encephalopathy. Recent admission 8/23 for syncope and withdrawals. PMH: alcohol abuse, fall 1 year ago resulting in subarachnoid hemorrhage and thoracic fractures, homelessness.    PT Comments    Pt eager to mobilize, demonstrating good progress with activity tolerance. Pt remains impulsive and requires multiple safety cues with RW during mobility. PT focused on exercises with single LE support to progress towards safe stair navigation. Pt progressing well, would benefit from OPPT to address higher level balance and strength.     Recommendations for follow up therapy are one component of a multi-disciplinary discharge planning process, led by the attending physician.  Recommendations may be updated based on patient status, additional functional criteria and insurance authorization.  Follow Up Recommendations  Outpatient PT     Assistance Recommended at Discharge Frequent or constant Supervision/Assistance  Patient can return home with the following A little help with walking and/or transfers;A little help with bathing/dressing/bathroom;Direct supervision/assist for medications management;Direct supervision/assist for financial management;Assist for transportation;Help with stairs or ramp for entrance   Equipment Recommendations       Recommendations for Other Services       Precautions / Restrictions Precautions Precautions: Fall Restrictions Weight Bearing Restrictions: No     Mobility  Bed Mobility Overal bed mobility: Needs Assistance Bed Mobility: Supine to Sit     Supine to sit: Modified independent (Device/Increase time)          Transfers Overall  transfer level: Needs assistance Equipment used: None Transfers: Sit to/from Stand Sit to Stand: Supervision                Ambulation/Gait Ambulation/Gait assistance: Min guard Gait Distance (Feet): 300 Feet Assistive device: Rolling walker (2 wheels) Gait Pattern/deviations: Step-through pattern, Decreased stride length, Wide base of support, Ataxic, Drifts right/left Gait velocity: decr     General Gait Details: close guard for safety, no overt unsteadiness or LOB today. Repeated verbal cuing for placement in RW, upright posture   Stairs             Wheelchair Mobility    Modified Rankin (Stroke Patients Only)       Balance Overall balance assessment: Needs assistance Sitting-balance support: Feet supported Sitting balance-Leahy Scale: Fair     Standing balance support: Reliant on assistive device for balance, Bilateral upper extremity supported Standing balance-Leahy Scale: Poor                              Cognition Arousal/Alertness: Awake/alert Behavior During Therapy: WFL for tasks assessed/performed, Impulsive Overall Cognitive Status: Within Functional Limits for tasks assessed                                 General Comments: can be impulsive, requires multiple cues to wait for PT close guard. Pt aided by in-person interpreter Raquel for quick communication        Exercises General Exercises - Lower Extremity Hip ABduction/ADduction: AROM, Both, 10 reps, Standing (bilat UE support on hallway rail) Hip Flexion/Marching: AROM, Both, 20 reps, Standing (bilat UE support on hallway rail) Mini-Sqauts: AROM, Both, 10 reps  General Comments        Pertinent Vitals/Pain Pain Assessment Pain Assessment: Faces Faces Pain Scale: Hurts little more Pain Location: L flank Pain Descriptors / Indicators: Discomfort, Sore Pain Intervention(s): Limited activity within patient's tolerance, Monitored during session,  Repositioned    Home Living                          Prior Function            PT Goals (current goals can now be found in the care plan section) Acute Rehab PT Goals Patient Stated Goal: To go home and work PT Goal Formulation: With patient Time For Goal Achievement: 10/28/21 Potential to Achieve Goals: Fair Progress towards PT goals: Progressing toward goals    Frequency    Min 3X/week      PT Plan Current plan remains appropriate    Co-evaluation              AM-PAC PT "6 Clicks" Mobility   Outcome Measure  Help needed turning from your back to your side while in a flat bed without using bedrails?: A Little Help needed moving from lying on your back to sitting on the side of a flat bed without using bedrails?: A Little Help needed moving to and from a bed to a chair (including a wheelchair)?: A Little Help needed standing up from a chair using your arms (e.g., wheelchair or bedside chair)?: A Little Help needed to walk in hospital room?: A Little Help needed climbing 3-5 steps with a railing? : A Lot 6 Click Score: 17    End of Session   Activity Tolerance: Patient tolerated treatment well Patient left: in chair;with call bell/phone within reach;with chair alarm set Nurse Communication: Mobility status PT Visit Diagnosis: Other abnormalities of gait and mobility (R26.89);Unsteadiness on feet (R26.81);Repeated falls (R29.6)     Time: 1210-1230 PT Time Calculation (min) (ACUTE ONLY): 20 min  Charges:  $Gait Training: 8-22 mins                     Marye Round, PT DPT Acute Rehabilitation Services Pager [phone]  Office [phone]   Tyrone Apple E Christain [place] 10/14/2021, 2:20 PM

## 2021-10-14 NOTE — Progress Notes (Signed)
PROGRESS NOTE        PATIENT DETAILS Name: Samuel [name] Age: 51 y.o. Sex: male Date of Birth: [birth year] Admit Date: 09/29/2021 Admitting Physician Emeline General, MD [id number], No Pcp Per  Brief Summary: Patient is a 51 y.o.  male with longstanding history of EtOH use-presented with approximately 1 week history of confusion,tingling/numbness involving all 4 extremities and frequent falls.  He was admitted to the hospitalist service-started on a high-dose IV thiamine-hospital course was complicated by development of alcohol withdrawal symptoms.  He was stabilized CIWA protocol-however even after completion of 5 days of high-dose IV thiamine-his symptoms did not improve significantly.  See below for further details.    Significant events: 9/13>> admit to [facility]-frequent falls/tingling/numbness to all 4 extremities  Significant studies: 9/13>> CT head: No acute intracranial abnormalities 9/13>> CT C-spine: No fracture/dislocation 9/13>> CT T-spine: Sequelae of T7 compression and T10 Chance fractures-interval healing.  No acute osseous abnormality seen. 9/13>> CT L-spine: No acute osseous abnormality seen. 9/13>> CT chest: No acute traumatic injury.  Chronic right clavicle/scapula/bilateral rib fractures. 9/13>> CT abdomen/pelvis: No traumatic injury 9/13>> vitamin B12: 584 9/13>> vitamin B1: 33.9 (low) 9/14>> MRI brain: No acute intracranial abnormality. 9/14>> SPEP/light chain: No M spike 9/14>> HBsAg/anti-HCV Ab: Negative 9/14>> ANA positive, dsDNA slightly elevated at 13. 9/14>> A1c 5.4 9/14>> TSH: 11.067. 9/16>> FT4: 0.81 (normal) 9/21>> MRI C-spine: No acute abnormality.  Significant microbiology data: None  Procedures: None  Consults: None  Subjective:  Patient in bed, appears comfortable, denies any headache, no fever, no chest pain or pressure, no shortness of breath , no abdominal pain. No new focal  weakness.    Objective: Vitals: Blood pressure (!) 124/91, pulse (!) 104, temperature 98.4 F (36.9 C), temperature source Oral, resp. rate 18, height 5\' 4"  (1.626 m), weight 55 kg, SpO2 98 %.   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  Completed 5 days of high-dose IV thiamine-now on oral thiamine-some minimal improvement over the past few days-still confabulates and still confused.  Needs to stop drinking-unfortunately-not safe yet to discharge unless his mentation improves further, social worker following trying to see if we can locate family in [place] that can provide 24/7 care.  EtOH withdrawal: Withdrawal symptoms resolved-has longstanding history of alcohol use.  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    Hypomagnesemia & Hypokalemia: replaced.  Normocytic anemia: No evidence of blood loss-probably due to alcohol use-bone marrow suppression.  But given neuropathy- SPEP/UPEP/light chains-ordered and pending.  Continue to follow CBC periodically.  Thrombocytopenia: Likely due to alcohol use-mild-follow periodically.  Hypothyroidism: Mild-have started low-dose Synthroid to see if this will improve any of his symptoms.  Recheck TSH now and in 4-6 weeks  Lab Results  Component Value Date   TSH 11.067 (H) 09/30/2021    ?  Lupus: Unclear significance of slightly elevated dsDNA titers-not sure if this has any role in his neuropathic symptoms.  Will need outpatient follow-up with rheumatology.  BMI: Estimated body mass index is 20.81  kg/m as calculated from the following:   Height as of this encounter: 5\' 4"  (1.626 m).   Weight as of this encounter: 55 kg.   Code status:   Code Status: Full Code   DVT Prophylaxis: enoxaparin (LOVENOX) injection 40 mg Start: 09/29/21 1000 SCDs Start: 09/29/21 6237   Family Communication: None at bedside   Disposition Plan: Status is: Inpatient Remains inpatient appropriate because: Continues to have confused-confabulating-goes on tangents-not yet safe for discharge.  Likely will need to go home with 24/7 care with family or SNF.   Planned Discharge Destination: Likely SNF-social worker following.  Diet: Diet Order             Diet regular Room service appropriate? No; Fluid consistency: Thin  Diet effective now                     Antimicrobial agents: Anti-infectives (From admission, onward)    None        MEDICATIONS: Scheduled Meds:  enoxaparin (LOVENOX) injection  40 mg Subcutaneous S28B   folic acid  1 mg Oral Daily   levothyroxine  25 mcg Oral Q0600   magnesium oxide  800 mg Oral BID   [START ON 10/17/2021] magnesium oxide  800 mg Oral Daily   multivitamin with minerals  1 tablet Oral Daily   thiamine  100 mg Oral Daily   Continuous Infusions:   PRN Meds:.acetaminophen **OR** acetaminophen, LORazepam, ondansetron **OR** ondansetron (ZOFRAN) IV, senna-docusate   I have personally reviewed following labs and imaging studies  LABORATORY DATA:  Recent Labs  Lab 10/09/21 0641 10/13/21 0700  WBC 13.7* 14.8*  HGB 11.3* 12.1*  HCT 32.4* 34.3*  PLT 286 326  MCV 99.4 96.1  MCH 34.7* 33.9  MCHC 34.9 35.3  RDW 15.7* 14.7    Recent Labs  Lab 10/09/21 0641 10/10/21 0609 10/13/21 0700 10/14/21 0547  NA 136  --  136  --   K 4.3  --  4.2  --   CL 104  --  105  --   CO2 23  --  24  --   GLUCOSE 95  --  94  --   BUN 8  --  9  --   CREATININE 0.47*  --  0.46*  --   CALCIUM 9.5  --  9.2  --   MG 1.6* 2.0 1.6* 1.7    RADIOLOGY  STUDIES/RESULTS: No results found.   LOS: 15 days   Signature  Lala Lund M.D on 10/14/2021 at 8:38 AM   -  To page go to [email or web address]

## 2021-10-14 NOTE — Progress Notes (Signed)
Occupational Therapy Treatment Patient Details Name: Samuel [name] MRN: [id number] DOB: [birth year] Today's Date: 10/14/2021   History of present illness 51 yo male presents to [facility] on 9/13 with bilat hand and feet numbness, LE swelling, frequent falls. CTH negative for acute findings. Workup for Wernicke's encephalopathy. Recent admission 8/23 for syncope and withdrawals. PMH: alcohol abuse, fall 1 year ago resulting in subarachnoid hemorrhage and thoracic fractures, homelessness.   OT comments  Patient is probably getting to his max functional potential in the acute care setting.  He is using his left upper extremity in all functional tasks with improved prehension and in hand manipulation.  He continues to use the left hand as more of a stabilize assist, but is needing minimal setup for grooming and ADL completion.  Frequency dropped to one times per week for a week or two.  Patient will need assistance with community mobility, groceries, medication management and consistent intermittent supervision at home.  Outpatient OT will probably not be needed.     Recommendations for follow up therapy are one component of a multi-disciplinary discharge planning process, led by the attending physician.  Recommendations may be updated based on patient status, additional functional criteria and insurance authorization.    Follow Up Recommendations  Outpatient OT    Assistance Recommended at Discharge Intermittent Supervision/Assistance  Patient can return home with the following  A little help with bathing/dressing/bathroom;A little help with walking and/or transfers;Assist for transportation;Help with stairs or ramp for entrance;Direct supervision/assist for medications management;Assistance with cooking/housework;Direct supervision/assist for financial management   Equipment Recommendations  None recommended by OT    Recommendations for Other Services      Precautions / Restrictions  Precautions Precautions: Fall Restrictions Weight Bearing Restrictions: No       Mobility Bed Mobility Overal bed mobility: Needs Assistance Bed Mobility: Sit to Supine       Sit to supine: Modified independent (Device/Increase time)        Transfers Overall transfer level: Needs assistance Equipment used: None Transfers: Sit to/from Stand Sit to Stand: Supervision     Step pivot transfers: Min guard           Balance Overall balance assessment: Needs assistance Sitting-balance support: Feet supported Sitting balance-Leahy Scale: Good     Standing balance support: No upper extremity supported Standing balance-Leahy Scale: Fair                             ADL either performed or assessed with clinical judgement   ADL   Eating/Feeding: Set up;Sitting   Grooming: Wash/dry hands;Standing;Supervision/safety           Upper Body Dressing : Set up;Sitting   Lower Body Dressing: Sit to/from stand;Min guard   Toilet Transfer: Min guard;Ambulation  Pertinent Vitals/ Pain       Pain Assessment Pain Assessment: Faces Faces Pain Scale: No hurt Pain Intervention(s): Monitored during session                                                          Frequency  Min 1X/week        Progress Toward Goals  OT Goals(current goals can now be found in the care plan section)  Progress towards OT goals: Progressing toward goals  Acute Rehab OT Goals Time For Goal Achievement: 10/27/21 Potential to Achieve Goals: [place] Discharge plan remains appropriate    Co-evaluation                 AM-PAC OT "6 Clicks" Daily Activity     Outcome Measure   Help from another person eating meals?: None Help from another person taking care of personal  grooming?: None Help from another person toileting, which includes using toliet, bedpan, or urinal?: A Little Help from another person bathing (including washing, rinsing, drying)?: A Little Help from another person to put on and taking off regular upper body clothing?: None Help from another person to put on and taking off regular lower body clothing?: A Little 6 Click Score: 21    End of Session    OT Visit Diagnosis: Unsteadiness on feet (R26.81);Ataxia, unspecified (R27.0);Other symptoms and signs involving cognitive function   Activity Tolerance Patient tolerated treatment well   Patient Left in bed;with call bell/phone within reach   Nurse Communication          Time: 0932-6712 OT Time Calculation (min): 17 min  Charges: OT General Charges $OT Visit: 1 Visit OT Treatments $Self Care/Home Management : 8-22 mins  10/14/2021  RP, OTR/L  Acute Rehabilitation Services  Office:  [phone]   Metta Clines 10/14/2021, 2:43 PM

## 2021-10-14 NOTE — TOC Progression Note (Signed)
Transition of Care [facility]) - Progression Note    Patient Details  Name: Samuel [name] MRN: [id number] Date of Birth: 26-Oct-1970  Transition of Care [facility] CM/SW [place], RN Phone Number: 10/14/2021, 4:16 PM  Clinical Narrative:    TOC continue to follow for progression.    Expected Discharge Plan: Home/Self Care Barriers to Discharge: Continued Medical Work up  Expected Discharge Plan and Services Expected Discharge Plan: Home/Self Care In-house Referral: Clinical Social Work Discharge Planning Services: CM Consult   Living arrangements for the past 2 months: [place]: [place] (charity) Date [place]: 10/05/21 Time [place]: 1230 Representative spoke with at [place]: Amy   Social Determinants of Health ([place]) Interventions Food Insecurity Interventions: Inpatient TOC Housing Interventions: Inpatient TOC Transportation Interventions: Inpatient TOC Utilities Interventions: Intervention Not Indicated  Readmission Risk Interventions    07/21/2020    2:26 PM  Readmission Risk Prevention Plan  Medication Screening Complete  Transportation Screening Complete

## 2021-10-15 LAB — PHOSPHORUS: Phosphorus: 3.7 mg/dL (ref 2.5–4.6)

## 2021-10-15 LAB — BASIC METABOLIC PANEL
Anion gap: 6 (ref 5–15)
BUN: 7 mg/dL (ref 6–20)
CO2: 24 mmol/L (ref 22–32)
Calcium: 8.8 mg/dL — ABNORMAL LOW (ref 8.9–10.3)
Chloride: 104 mmol/L (ref 98–111)
Creatinine, Ser: 0.55 mg/dL — ABNORMAL LOW (ref 0.61–1.24)
GFR, Estimated: >60 mL/min (ref >60)
Glucose, Bld: 96 mg/dL (ref 70–99)
Potassium: 3.9 mmol/L (ref 3.5–5.1)
Sodium: 134 mmol/L — ABNORMAL LOW (ref 135–145)

## 2021-10-15 LAB — T4, FREE: Free T4: 1.2 ng/dL — ABNORMAL HIGH (ref 0.61–1.12)

## 2021-10-15 LAB — MAGNESIUM: Magnesium: 1.6 mg/dL — ABNORMAL LOW (ref 1.7–2.4)

## 2021-10-15 LAB — TSH: TSH: 10.127 u[IU]/mL — ABNORMAL HIGH (ref 0.350–4.500)

## 2021-10-15 MED ORDER — MAGNESIUM SULFATE 2 GM/50ML IV SOLN
2.0000 g | Freq: Once | INTRAVENOUS | Status: AC
  Administered 2021-10-15: 2 g via INTRAVENOUS
  Filled 2021-10-15: qty 50

## 2021-10-15 MED ORDER — MAGNESIUM SULFATE 4 GM/100ML IV SOLN
4.0000 g | Freq: Once | INTRAVENOUS | Status: AC
  Administered 2021-10-15: 4 g via INTRAVENOUS
  Filled 2021-10-15: qty 100

## 2021-10-15 NOTE — Progress Notes (Signed)
PROGRESS NOTE        PATIENT DETAILS Name: Samuel [name] Age: 51 y.o. Sex: male Date of Birth: Jun 30, 1970 Admit Date: 09/29/2021 Admitting Physician Lequita Halt, MD [id number], No Pcp Per  Brief Summary: Patient is a 52 y.o.  male with longstanding history of EtOH use-presented with approximately 1 week history of confusion,tingling/numbness involving all 4 extremities and frequent falls.  He was admitted to the hospitalist service-started on a high-dose IV thiamine-hospital course was complicated by development of alcohol withdrawal symptoms.  He was stabilized CIWA protocol-however even after completion of 5 days of high-dose IV thiamine-his symptoms did not improve significantly.  See below for further details.    Significant events: 9/13>> admit to [facility]-frequent falls/tingling/numbness to all 4 extremities  Significant studies: 9/13>> CT head: No acute intracranial abnormalities 9/13>> CT C-spine: No fracture/dislocation 9/13>> CT T-spine: Sequelae of T7 compression and T10 Chance fractures-interval healing.  No acute osseous abnormality seen. 9/13>> CT L-spine: No acute osseous abnormality seen. 9/13>> CT chest: No acute traumatic injury.  Chronic right clavicle/scapula/bilateral rib fractures. 9/13>> CT abdomen/pelvis: No traumatic injury 9/13>> vitamin B12: 584 9/13>> vitamin B1: 33.9 (low) 9/14>> MRI brain: No acute intracranial abnormality. 9/14>> SPEP/light chain: No M spike 9/14>> HBsAg/anti-HCV Ab: Negative 9/14>> ANA positive, dsDNA slightly elevated at 13. 9/14>> A1c 5.4 9/14>> TSH: 11.067. 9/16>> FT4: 0.81 (normal) 9/21>> MRI C-spine: No acute abnormality.  Significant microbiology data: None  Procedures: None  Consults: None  Subjective:  Patient in bed, appears comfortable, denies any headache, no fever, no chest pain or pressure, no shortness of breath , no abdominal pain. No new focal  weakness.  Objective: Vitals: Blood pressure 110/79, pulse 98, temperature 98.6 F (37 C), temperature source Oral, resp. rate 18, height 5\' 4"  (1.626 m), weight 56.5 kg, SpO2 96 %.   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  Completed 5 days of high-dose IV thiamine-now on oral thiamine-some minimal improvement over the past few days-still confabulates and still confused.  Needs to stop drinking-unfortunately-not safe yet to discharge unless his mentation improves further, social worker following trying to see if we can locate family in [place] that can provide 24/7 care.  EtOH withdrawal: Withdrawal symptoms resolved-has longstanding history of alcohol use.  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    Hypomagnesemia & Hypokalemia: replaced.  Normocytic anemia: No evidence of blood loss-probably due to alcohol use-bone marrow suppression.  But given neuropathy- SPEP/UPEP/light chains-ordered and pending.  Continue to follow CBC periodically.  Thrombocytopenia: Likely due to alcohol use-mild-follow periodically.  Elevated TSH with high free T4.  Question if he has secondary hyper thyroidism, hold Synthroid and repeat TSH along with free T4 and T3, recheck TSH now and in 4-6 weeks  Lab Results  Component Value Date   TSH 10.127 (H) 10/15/2021    ?  Lupus: Unclear significance of slightly elevated dsDNA titers-not sure if this has any role in his neuropathic symptoms.  Will need outpatient follow-up with rheumatology.  BMI: Estimated body  mass index is 21.38 kg/m as calculated from the following:   Height as of this encounter: 5\' 4"  (1.626 m).   Weight as of this encounter: 56.5 kg.   Code status:   Code Status: Full Code   DVT Prophylaxis: enoxaparin (LOVENOX) injection 40 mg Start: 09/29/21 1000 SCDs Start: 09/29/21 10/01/21   Family Communication: None at bedside   Disposition Plan: Status is: Inpatient Remains inpatient appropriate because: Continues to have confused-confabulating-goes on tangents-not yet safe for discharge.  Likely will need to go home with 24/7 care with family or SNF.   Planned Discharge Destination: Likely SNF-social worker following.  Diet: Diet Order             Diet regular Room service appropriate? No; Fluid consistency: Thin  Diet effective now                     Antimicrobial agents: Anti-infectives (From admission, onward)    None        MEDICATIONS: Scheduled Meds:  enoxaparin (LOVENOX) injection  40 mg Subcutaneous Q24H   folic acid  1 mg Oral Daily   magnesium oxide  800 mg Oral BID   [START ON 10/17/2021] magnesium oxide  800 mg Oral Daily   multivitamin with minerals  1 tablet Oral Daily   thiamine  100 mg Oral Daily   Continuous Infusions:  magnesium sulfate bolus IVPB      PRN Meds:.acetaminophen **OR** acetaminophen, LORazepam, ondansetron **OR** ondansetron (ZOFRAN) IV, senna-docusate   I have personally reviewed following labs and imaging studies  LABORATORY DATA:  Recent Labs  Lab 10/09/21 0641 10/13/21 0700  WBC 13.7* 14.8*  HGB 11.3* 12.1*  HCT 32.4* 34.3*  PLT 286 326  MCV 99.4 96.1  MCH 34.7* 33.9  MCHC 34.9 35.3  RDW 15.7* 14.7    Recent Labs  Lab 10/09/21 0641 10/10/21 0609 10/13/21 0700 10/14/21 0547 10/15/21 0403  NA 136  --  136  --  134*  K 4.3  --  4.2  --  3.9  CL 104  --  105  --  104  CO2 23  --  24  --  24  GLUCOSE 95  --  94  --  96  BUN 8  --  9  --  7  CREATININE 0.47*  --  0.46*  --  0.55*  CALCIUM 9.5   --  9.2  --  8.8*  MG 1.6* 2.0 1.6* 1.7 1.6*  PHOS  --   --   --   --  3.7  TSH  --   --   --   --  10.127*    RADIOLOGY STUDIES/RESULTS: No results found.   LOS: 16 days   Signature  10/17/21 M.D on 10/15/2021 at 10:51 AM   -  To page go to [email or web address]

## 2021-10-16 LAB — MAGNESIUM: Magnesium: 1.8 mg/dL (ref 1.7–2.4)

## 2021-10-16 LAB — T4, FREE: Free T4: 1.21 ng/dL — ABNORMAL HIGH (ref 0.61–1.12)

## 2021-10-16 LAB — TSH: TSH: 10.307 u[IU]/mL — ABNORMAL HIGH (ref 0.350–4.500)

## 2021-10-16 MED ORDER — MAGNESIUM OXIDE -MG SUPPLEMENT 400 (240 MG) MG PO TABS
800.0000 mg | ORAL_TABLET | Freq: Two times a day (BID) | ORAL | Status: DC
  Administered 2021-10-16 – 2021-10-21 (×11): 800 mg via ORAL
  Filled 2021-10-16 (×11): qty 2

## 2021-10-16 NOTE — Progress Notes (Signed)
PROGRESS NOTE        PATIENT DETAILS Name: Samuel [name] Age: 51 y.o. Sex: male Date of Birth: 02-May-1970 Admit Date: 09/29/2021 Admitting Physician Lequita Halt, MD [id number], No Pcp Per  Brief Summary: Patient is a 51 y.o.  male with longstanding history of EtOH use-presented with approximately 1 week history of confusion,tingling/numbness involving all 4 extremities and frequent falls.  He was admitted to the hospitalist service-started on a high-dose IV thiamine-hospital course was complicated by development of alcohol withdrawal symptoms.  He was stabilized CIWA protocol-however even after completion of 5 days of high-dose IV thiamine-his symptoms did not improve significantly.  See below for further details.    Significant events: 9/13>> admit to [facility]-frequent falls/tingling/numbness to all 4 extremities  Significant studies: 9/13>> CT head: No acute intracranial abnormalities 9/13>> CT C-spine: No fracture/dislocation 9/13>> CT T-spine: Sequelae of T7 compression and T10 Chance fractures-interval healing.  No acute osseous abnormality seen. 9/13>> CT L-spine: No acute osseous abnormality seen. 9/13>> CT chest: No acute traumatic injury.  Chronic right clavicle/scapula/bilateral rib fractures. 9/13>> CT abdomen/pelvis: No traumatic injury 9/13>> vitamin B12: 584 9/13>> vitamin B1: 33.9 (low) 9/14>> MRI brain: No acute intracranial abnormality. 9/14>> SPEP/light chain: No M spike 9/14>> HBsAg/anti-HCV Ab: Negative 9/14>> ANA positive, dsDNA slightly elevated at 13. 9/14>> A1c 5.4 9/14>> TSH: 11.067. 9/16>> FT4: 0.81 (normal) 9/21>> MRI C-spine: No acute abnormality.  Significant microbiology data: None  Procedures: None  Consults: None  Subjective: Patient in bed, appears comfortable, denies any headache, no fever, no chest pain or pressure, no shortness of breath , no abdominal pain. No focal  weakness.  Objective: Vitals: Blood pressure 134/88, pulse 99, temperature 98.6 F (37 C), temperature source Oral, resp. rate 16, height 5\' 4"  (1.626 m), weight 56.8 kg, SpO2 98 %.   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  Completed 5 days of high-dose IV thiamine-now on oral thiamine-some minimal improvement over the past few days-still confabulates and still confused.  Needs to stop drinking-unfortunately-not safe yet to discharge unless his mentation improves further, social worker following trying to see if we can locate family in [place] that can provide 24/7 care.  EtOH withdrawal: Withdrawal symptoms resolved-has longstanding history of alcohol use.  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    Hypomagnesemia & Hypokalemia: replaced.  Normocytic anemia: No evidence of blood loss-probably due to alcohol use-bone marrow suppression.  But given neuropathy- SPEP/UPEP/light chains-ordered and pending.  Continue to follow CBC periodically.  Thrombocytopenia: Likely due to alcohol use-mild-follow periodically.  Elevated TSH with high free T4.  Question if he has secondary hyper thyroidism, hold Synthroid and repeat TSH along with free T4 and T3, recheck TSH now and in 4-6 weeks, will require outpatient endocrine follow-up as well.  Lab Results  Component Value Date   TSH 10.307 (H) 10/16/2021    ?  Lupus: Unclear significance of slightly elevated dsDNA titers-not sure if this has any role in his neuropathic symptoms.  Will need outpatient  follow-up with  rheumatology.  BMI: Estimated body mass index is 21.49 kg/m as calculated from the following:   Height as of this encounter: 5\' 4"  (1.626 m).   Weight as of this encounter: 56.8 kg.   Code status:   Code Status: Full Code   DVT Prophylaxis: enoxaparin (LOVENOX) injection 40 mg Start: 09/29/21 1000 SCDs Start: 09/29/21 10/01/21   Family Communication: None at bedside   Disposition Plan: Status is: Inpatient, he is medically stable await safe disposition per TOC.   Planned Discharge Destination: Likely SNF-social worker following.  Diet: Diet Order             Diet regular Room service appropriate? No; Fluid consistency: Thin  Diet effective now                     Antimicrobial agents: Anti-infectives (From admission, onward)    None        MEDICATIONS: Scheduled Meds:  enoxaparin (LOVENOX) injection  40 mg Subcutaneous Q24H   folic acid  1 mg Oral Daily   magnesium oxide  800 mg Oral BID   multivitamin with minerals  1 tablet Oral Daily   thiamine  100 mg Oral Daily   Continuous Infusions:    PRN Meds:.acetaminophen **OR** acetaminophen, LORazepam, ondansetron **OR** ondansetron (ZOFRAN) IV, senna-docusate   I have personally reviewed following labs and imaging studies  LABORATORY DATA:  Recent Labs  Lab 10/13/21 0700  WBC 14.8*  HGB 12.1*  HCT 34.3*  PLT 326  MCV 96.1  MCH 33.9  MCHC 35.3  RDW 14.7    Recent Labs  Lab 10/10/21 0609 10/13/21 0700 10/14/21 0547 10/15/21 0403 10/16/21 0434  NA  --  136  --  134*  --   K  --  4.2  --  3.9  --   CL  --  105  --  104  --   CO2  --  24  --  24  --   GLUCOSE  --  94  --  96  --   BUN  --  9  --  7  --   CREATININE  --  0.46*  --  0.55*  --   CALCIUM  --  9.2  --  8.8*  --   MG 2.0 1.6* 1.7 1.6* 1.8  PHOS  --   --   --  3.7  --   TSH  --   --   --  10.127* 10.307*    RADIOLOGY STUDIES/RESULTS: No results found.   LOS: 17 days   Signature  10/18/21 M.D on  10/16/2021 at 10:21 AM   -  To page go to [email or web address]

## 2021-10-17 LAB — MAGNESIUM: Magnesium: 1.6 mg/dL — ABNORMAL LOW (ref 1.7–2.4)

## 2021-10-17 LAB — T3: T3, Total: 122 ng/dL (ref 71–180)

## 2021-10-17 MED ORDER — MAGNESIUM SULFATE 4 GM/100ML IV SOLN
4.0000 g | Freq: Three times a day (TID) | INTRAVENOUS | Status: AC
  Administered 2021-10-17 (×2): 4 g via INTRAVENOUS
  Filled 2021-10-17 (×3): qty 100

## 2021-10-17 NOTE — Progress Notes (Signed)
PROGRESS NOTE        PATIENT DETAILS Name: Winkelman Age: 51 y.o. Sex: male Date of Birth: 27-Jun-1970 Admit Date: 09/29/2021 Admitting Physician Lequita Halt, MD [id number], No Pcp Per  Brief Summary: Patient is a 51 y.o.  male with longstanding history of EtOH use-presented with approximately 1 week history of confusion,tingling/numbness involving all 4 extremities and frequent falls.  He was admitted to the hospitalist service-started on a high-dose IV thiamine-hospital course was complicated by development of alcohol withdrawal symptoms.  He was stabilized CIWA protocol-however even after completion of 5 days of high-dose IV thiamine-his symptoms did not improve significantly.  See below for further details.    Significant events: 9/13>> admit to [facility]-frequent falls/tingling/numbness to all 4 extremities  Significant studies: 9/13>> CT head: No acute intracranial abnormalities 9/13>> CT C-spine: No fracture/dislocation 9/13>> CT T-spine: Sequelae of T7 compression and T10 Chance fractures-interval healing.  No acute osseous abnormality seen. 9/13>> CT L-spine: No acute osseous abnormality seen. 9/13>> CT chest: No acute traumatic injury.  Chronic right clavicle/scapula/bilateral rib fractures. 9/13>> CT abdomen/pelvis: No traumatic injury 9/13>> vitamin B12: 584 9/13>> vitamin B1: 33.9 (low) 9/14>> MRI brain: No acute intracranial abnormality. 9/14>> SPEP/light chain: No M spike 9/14>> HBsAg/anti-HCV Ab: Negative 9/14>> ANA positive, dsDNA slightly elevated at 13. 9/14>> A1c 5.4 9/14>> TSH: 11.067. 9/16>> FT4: 0.81 (normal) 9/21>> MRI C-spine: No acute abnormality.  Significant microbiology data: None  Procedures: None  Consults: None  Subjective:  Patient in bed, appears comfortable, denies any headache, no fever, no chest pain or pressure, no shortness of breath , no abdominal pain. No new focal  weakness.   Objective: Vitals: Blood pressure 122/79, pulse (!) 101, temperature 97.9 F (36.6 C), temperature source Oral, resp. rate 18, height 5\' 4"  (1.626 m), weight 55.9 kg, SpO2 96 %.   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  Completed 5 days of high-dose IV thiamine-now on oral thiamine-some minimal improvement over the past few days-still confabulates and still confused.  Needs to stop drinking-unfortunately-not safe yet to discharge unless his mentation improves further, social worker following trying to see if we can locate family in [place] that can provide 24/7 care.  EtOH withdrawal: Withdrawal symptoms resolved-has longstanding history of alcohol use.  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    Hypomagnesemia & Hypokalemia: replaced again.  Normocytic anemia: No evidence of blood loss-probably due to alcohol use-bone marrow suppression.  But given neuropathy- SPEP/UPEP/light chains-ordered and pending.  Continue to follow CBC periodically.  Thrombocytopenia: Likely due to alcohol use-mild-follow periodically.  Elevated TSH with high free T4.  Question if he has secondary hyper thyroidism, hold Synthroid and repeat TSH along with free T4 and T3, recheck TSH now and in 4-6 weeks, will require outpatient endocrine follow-up as well.  Lab Results  Component Value Date   TSH 10.307 (H) 10/16/2021    ?  Lupus: Unclear significance of slightly elevated dsDNA titers-not sure if this has any role in his neuropathic symptoms.  Will  need outpatient follow-up with rheumatology.  BMI: Estimated body mass index is 21.15 kg/m as calculated from the following:   Height as of this encounter: 5\' 4"  (1.626 m).   Weight as of this encounter: 55.9 kg.   Code status:   Code Status: Full Code   DVT Prophylaxis: enoxaparin (LOVENOX) injection 40 mg Start: 09/29/21 1000 SCDs Start: 09/29/21 9371   Family Communication: None at bedside   Disposition Plan: Status is: Inpatient, he is medically stable await safe disposition per TOC.   Planned Discharge Destination: Likely SNF-social worker following.  Diet: Diet Order             Diet regular Room service appropriate? No; Fluid consistency: Thin  Diet effective now                     Antimicrobial agents: Anti-infectives (From admission, onward)    None        MEDICATIONS: Scheduled Meds:  enoxaparin (LOVENOX) injection  40 mg Subcutaneous I96V   folic acid  1 mg Oral Daily   magnesium oxide  800 mg Oral BID   multivitamin with minerals  1 tablet Oral Daily   thiamine  100 mg Oral Daily   Continuous Infusions:  magnesium sulfate bolus IVPB       PRN Meds:.acetaminophen **OR** acetaminophen, LORazepam, ondansetron **OR** ondansetron (ZOFRAN) IV, senna-docusate   I have personally reviewed following labs and imaging studies  LABORATORY DATA:  Recent Labs  Lab 10/13/21 0700  WBC 14.8*  HGB 12.1*  HCT 34.3*  PLT 326  MCV 96.1  MCH 33.9  MCHC 35.3  RDW 14.7    Recent Labs  Lab 10/13/21 0700 10/14/21 0547 10/15/21 0403 10/16/21 0434 10/17/21 0648  NA 136  --  134*  --   --   K 4.2  --  3.9  --   --   CL 105  --  104  --   --   CO2 24  --  24  --   --   GLUCOSE 94  --  96  --   --   BUN 9  --  7  --   --   CREATININE 0.46*  --  0.55*  --   --   CALCIUM 9.2  --  8.8*  --   --   MG 1.6* 1.7 1.6* 1.8 1.6*  PHOS  --   --  3.7  --   --   TSH  --   --  10.127* 10.307*  --     RADIOLOGY STUDIES/RESULTS: No results found.    LOS: 18 days   Signature  Lala Lund M.D on 10/17/2021 at 8:36 AM   -  To page go to [email or web address]

## 2021-10-17 NOTE — Plan of Care (Signed)
  Problem: Health Behavior/Discharge Planning: Goal: Ability to manage health-related needs will improve Outcome: Progressing   Problem: Nutrition: Goal: Adequate nutrition will be maintained Outcome: Progressing   Problem: Pain Managment: Goal: General experience of comfort will improve Outcome: Progressing   Problem: Safety: Goal: Ability to remain free from injury will improve Outcome: Progressing   Problem: Skin Integrity: Goal: Risk for impaired skin integrity will decrease Outcome: Progressing   

## 2021-10-18 LAB — BASIC METABOLIC PANEL
Anion gap: 10 (ref 5–15)
BUN: 9 mg/dL (ref 6–20)
CO2: 23 mmol/L (ref 22–32)
Calcium: 8.9 mg/dL (ref 8.9–10.3)
Chloride: 103 mmol/L (ref 98–111)
Creatinine, Ser: 0.5 mg/dL — ABNORMAL LOW (ref 0.61–1.24)
GFR, Estimated: >60 mL/min (ref >60)
Glucose, Bld: 124 mg/dL — ABNORMAL HIGH (ref 70–99)
Potassium: 3.5 mmol/L (ref 3.5–5.1)
Sodium: 136 mmol/L (ref 135–145)

## 2021-10-18 LAB — T4, FREE: Free T4: 1.11 ng/dL (ref 0.61–1.12)

## 2021-10-18 LAB — MAGNESIUM: Magnesium: 2 mg/dL (ref 1.7–2.4)

## 2021-10-18 LAB — TSH: TSH: 9.616 u[IU]/mL — ABNORMAL HIGH (ref 0.350–4.500)

## 2021-10-18 NOTE — TOC Transition Note (Signed)
Transition of Care [facility]) - CM/SW Discharge Note   Patient Details  Name: Samuel [name] MRN: [id number] Date of Birth: [birth year]  [facility]) CM/SW Contact:  Cyndi Bender, RN Phone Number: 10/18/2021, 3:09 PM   Clinical Narrative:     TOC continuing to follow. No current disposition options for full time supervision for an uninsured patient at the age of 53.    Barriers to Discharge: Unsafe home situation, Inadequate or no insurance, Undocumented   Patient Goals and CMS Choice Patient states their goals for this hospitalization and ongoing recovery are:: to go home CMS Medicare.gov Compare Post Acute Care list provided to:: Other (Comment Required) (charity)    Discharge Placement                       Discharge Plan and Services In-house Referral: Clinical Social Work Discharge Planning Services: CM Consult                        [place] Agency: [place] (charity) Date [place]: 10/05/21 Time [place]: 1230 Representative spoke with at [place]: Amy  Social Determinants of Health ([place]) Interventions Food Insecurity Interventions: Inpatient TOC Housing Interventions: Inpatient TOC Transportation Interventions: Inpatient TOC Utilities Interventions: Intervention Not Indicated   Readmission Risk Interventions    07/21/2020    2:26 PM  Readmission Risk Prevention Plan  Medication Screening Complete  Transportation Screening Complete

## 2021-10-18 NOTE — Progress Notes (Signed)
Physical Therapy Treatment Patient Details Name: Samuel [name] MRN: [id number] DOB: 12/17/1970 Today's Date: 10/18/2021   History of Present Illness 51 yo male presents to [facility] on 9/13 with bilat hand and feet numbness, LE swelling, frequent falls. CTH negative for acute findings. Workup for Wernicke's encephalopathy. Recent admission 8/23 for syncope and withdrawals. PMH: alcohol abuse, fall 1 year ago resulting in subarachnoid hemorrhage and thoracic fractures, homelessness.    PT Comments    Pt eager to participate in gait/balance activity.  Emphasis on age appropriate balance tasks.  Pt able to manage at supervision level with some deviation noted.    Recommendations for follow up therapy are one component of a multi-disciplinary discharge planning process, led by the attending physician.  Recommendations may be updated based on patient status, additional functional criteria and insurance authorization.  Follow Up Recommendations  Outpatient PT     Assistance Recommended at Discharge Frequent or constant Supervision/Assistance  Patient can return home with the following A little help with walking and/or transfers;A little help with bathing/dressing/bathroom;Direct supervision/assist for medications management;Direct supervision/assist for financial management;Assist for transportation;Help with stairs or ramp for entrance   Equipment Recommendations  Rolling walker (2 wheels);Other (comment)    Recommendations for Other Services       Precautions / Restrictions Precautions Precautions: Fall     Mobility  Bed Mobility Overal bed mobility: Modified Independent                  Transfers Overall transfer level: Needs assistance Equipment used: None Transfers: Sit to/from Stand Sit to Stand: Supervision                Ambulation/Gait Ambulation/Gait assistance: Supervision Gait Distance (Feet): 500 Feet Assistive device: None Gait  Pattern/deviations: Step-through pattern, Decreased step length - right, Decreased step length - left, Decreased stride length   Gait velocity interpretation: 1.31 - 2.62 ft/sec, indicative of limited community ambulator   General Gait Details: generally safe, a few episodes of balance deviation, without overt LOB, poor heel/toe patterning, almost a steppage gait   Stairs Stairs: Yes Stairs assistance: Mod assist, Max assist Stair Management: One rail Right Number of Stairs: 5 General stair comments: pt attempts to alternate steps and buckled fully using L LE up.. Afterward, pt used step to pattern up with R LE and able with mod/ max stability and boost.  Descending needed stability assist also.   Wheelchair Mobility    Modified Rankin (Stroke Patients Only)       Balance Overall balance assessment: Needs assistance Sitting-balance support: Feet supported Sitting balance-Leahy Scale: Good     Standing balance support: No upper extremity supported Standing balance-Leahy Scale: Fair                   Standardized Balance Assessment Standardized Balance Assessment : Dynamic Gait Index   Dynamic Gait Index Level Surface: Mild Impairment Change in Gait Speed: Mild Impairment Gait with Horizontal Head Turns: Mild Impairment Gait and Pivot Turn: Mild Impairment Step Over Obstacle: Mild Impairment Steps: Moderate Impairment      Cognition Arousal/Alertness: Awake/alert Behavior During Therapy: WFL for tasks assessed/performed, Impulsive Overall Cognitive Status: Within Functional Limits for tasks assessed                                          Exercises      General  Comments        Pertinent Vitals/Pain Pain Assessment Pain Assessment: No/denies pain    Home Living                          Prior Function            PT Goals (current goals can now be found in the care plan section) Acute Rehab PT Goals Patient Stated  Goal: To go home and work PT Goal Formulation: With patient Time For Goal Achievement: 10/28/21 Potential to Achieve Goals: Fair Progress towards PT goals: Progressing toward goals    Frequency    Min 3X/week      PT Plan Current plan remains appropriate    Co-evaluation              AM-PAC PT "6 Clicks" Mobility   Outcome Measure  Help needed turning from your back to your side while in a flat bed without using bedrails?: A Little Help needed moving from lying on your back to sitting on the side of a flat bed without using bedrails?: A Little Help needed moving to and from a bed to a chair (including a wheelchair)?: A Little Help needed standing up from a chair using your arms (e.g., wheelchair or bedside chair)?: A Little Help needed to walk in hospital room?: A Little Help needed climbing 3-5 steps with a railing? : A Lot 6 Click Score: 17    End of Session   Activity Tolerance: Patient tolerated treatment well Patient left: in chair;with call bell/phone within reach;with chair alarm set Nurse Communication: Mobility status PT Visit Diagnosis: Other abnormalities of gait and mobility (R26.89);Unsteadiness on feet (R26.81);Repeated falls (R29.6)     Time: 1829-9371 PT Time Calculation (min) (ACUTE ONLY): 18 min  Charges:  $Gait Training: 8-22 mins                     10/18/2021  Ginger Carne., PT Acute Rehabilitation Services [phone]  (office)   Samuel [name] 10/18/2021, 12:33 PM

## 2021-10-18 NOTE — Progress Notes (Signed)
PROGRESS NOTE        PATIENT DETAILS Name: Samuel [name] Age: 51 y.o. Sex: male Date of Birth: Dec 25, 1970 Admit Date: 09/29/2021 Admitting Physician Lequita Halt, MD [id number], No Pcp Per  Brief Summary: Patient is a 51 y.o.  male with longstanding history of EtOH use-presented with approximately 1 week history of confusion,tingling/numbness involving all 4 extremities and frequent falls.  He was admitted to the hospitalist service-started on a high-dose IV thiamine-hospital course was complicated by development of alcohol withdrawal symptoms.  He was stabilized CIWA protocol-however even after completion of 5 days of high-dose IV thiamine-his symptoms did not improve significantly.  See below for further details.    Significant events: 9/13>> admit to [facility]-frequent falls/tingling/numbness to all 4 extremities  Significant studies: 9/13>> CT head: No acute intracranial abnormalities 9/13>> CT C-spine: No fracture/dislocation 9/13>> CT T-spine: Sequelae of T7 compression and T10 Chance fractures-interval healing.  No acute osseous abnormality seen. 9/13>> CT L-spine: No acute osseous abnormality seen. 9/13>> CT chest: No acute traumatic injury.  Chronic right clavicle/scapula/bilateral rib fractures. 9/13>> CT abdomen/pelvis: No traumatic injury 9/13>> vitamin B12: 584 9/13>> vitamin B1: 33.9 (low) 9/14>> MRI brain: No acute intracranial abnormality. 9/14>> SPEP/light chain: No M spike 9/14>> HBsAg/anti-HCV Ab: Negative 9/14>> ANA positive, dsDNA slightly elevated at 13. 9/14>> A1c 5.4 9/14>> TSH: 11.067. 9/16>> FT4: 0.81 (normal) 9/21>> MRI C-spine: No acute abnormality.  Significant microbiology data: None  Procedures: None  Consults: None  Subjective:  Patient in bed, appears comfortable, denies any headache, no fever, no chest pain or pressure, no shortness of breath , no abdominal pain. No new focal  weakness.    Objective: Vitals: Blood pressure (!) 115/92, pulse (!) 115, temperature 98.7 F (37.1 C), temperature source Oral, resp. rate 18, height 5\' 4"  (1.626 m), weight 56.3 kg, SpO2 97 %.   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  Completed 5 days of high-dose IV thiamine-now on oral thiamine-some minimal improvement over the past few days-still confabulates and still confused.  Needs to stop drinking-unfortunately-not safe yet to discharge unless his mentation improves further, social worker following trying to see if we can locate family in [place] that can provide 24/7 care.  EtOH withdrawal: Withdrawal symptoms resolved-has longstanding history of alcohol use.  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    Hypomagnesemia & Hypokalemia: replaced again.  Normocytic anemia: No evidence of blood loss-probably due to alcohol use-bone marrow suppression.  But given neuropathy- SPEP/UPEP/light chains-ordered and pending.  Continue to follow CBC periodically.  Thrombocytopenia: Likely due to alcohol use-mild-follow periodically.  Elevated TSH with high free T4.  Sick euthyroid with stable free T4 and T3.  Repeat TSH in about 4 weeks time.  Lab Results  Component Value Date   TSH 9.616 (H) 10/18/2021    ?  Lupus: Unclear significance of slightly elevated dsDNA titers-not sure if this has any role in his neuropathic symptoms.  Will need outpatient follow-up with rheumatology.  BMI: Estimated body mass index is 21.3  kg/m as calculated  from the following:   Height as of this encounter: 5\' 4"  (1.626 m).   Weight as of this encounter: 56.3 kg.   Code status:   Code Status: Full Code   DVT Prophylaxis: enoxaparin (LOVENOX) injection 40 mg Start: 09/29/21 1000 SCDs Start: 09/29/21 7654   Family Communication: None at bedside   Disposition Plan: Status is: Inpatient, he is medically stable await safe disposition per TOC.   Planned Discharge Destination: Likely SNF-social worker following.  Diet: Diet Order             Diet regular Room service appropriate? No; Fluid consistency: Thin  Diet effective now                     Antimicrobial agents: Anti-infectives (From admission, onward)    None        MEDICATIONS: Scheduled Meds:  enoxaparin (LOVENOX) injection  40 mg Subcutaneous Y50P   folic acid  1 mg Oral Daily   magnesium oxide  800 mg Oral BID   multivitamin with minerals  1 tablet Oral Daily   thiamine  100 mg Oral Daily   Continuous Infusions:  PRN Meds:.acetaminophen **OR** acetaminophen, LORazepam, ondansetron **OR** ondansetron (ZOFRAN) IV, senna-docusate   I have personally reviewed following labs and imaging studies  LABORATORY DATA:  Recent Labs  Lab 10/13/21 0700  WBC 14.8*  HGB 12.1*  HCT 34.3*  PLT 326  MCV 96.1  MCH 33.9  MCHC 35.3  RDW 14.7    Recent Labs  Lab 10/13/21 0700 10/14/21 0547 10/15/21 0403 10/16/21 0434 10/17/21 0648 10/18/21 0144  NA 136  --  134*  --   --  136  K 4.2  --  3.9  --   --  3.5  CL 105  --  104  --   --  103  CO2 24  --  24  --   --  23  GLUCOSE 94  --  96  --   --  124*  BUN 9  --  7  --   --  9  CREATININE 0.46*  --  0.55*  --   --  0.50*  CALCIUM 9.2  --  8.8*  --   --  8.9  MG 1.6* 1.7 1.6* 1.8 1.6* 2.0  PHOS  --   --  3.7  --   --   --   TSH  --   --  10.127* 10.307*  --  9.616*    RADIOLOGY STUDIES/RESULTS: No results found.   LOS: 19 days   Signature  Lala Lund M.D on 10/18/2021 at 9:56 AM   -  To  page go to [email or web address]

## 2021-10-18 NOTE — Plan of Care (Signed)
  Problem: Activity: Goal: Risk for activity intolerance will decrease Outcome: Progressing   Problem: Nutrition: Goal: Adequate nutrition will be maintained Outcome: Progressing   Problem: Pain Managment: Goal: General experience of comfort will improve Outcome: Progressing   Problem: Safety: Goal: Ability to remain free from injury will improve Outcome: Progressing   Problem: Skin Integrity: Goal: Risk for impaired skin integrity will decrease Outcome: Progressing   

## 2021-10-19 LAB — T3: T3, Total: 105 ng/dL (ref 71–180)

## 2021-10-19 MED ORDER — POTASSIUM CHLORIDE CRYS ER 20 MEQ PO TBCR
40.0000 meq | EXTENDED_RELEASE_TABLET | Freq: Once | ORAL | Status: AC
  Administered 2021-10-19: 40 meq via ORAL
  Filled 2021-10-19: qty 2

## 2021-10-19 NOTE — Progress Notes (Signed)
Physical Therapy Treatment Patient Details Name: Samuel [name] MRN: [id number] DOB: [birth year] Today's Date: 10/19/2021   History of Present Illness 51 yo male presents to [facility] on 9/13 with bilat hand and feet numbness, LE swelling, frequent falls. CTH negative for acute findings. Workup for Wernicke's encephalopathy. Recent admission 8/23 for syncope and withdrawals. PMH: alcohol abuse, fall 1 year ago resulting in subarachnoid hemorrhage and thoracic fractures, homelessness.    PT Comments    Pt received up in room exiting bathroom, pt agreeable to therapy session with emphasis on standing balance tasks, BLE exercises for strengthening and gait training without AD. Emphasized importance of pt continuing to use RW in room for safety, especially when on his own due to pt increased fall risk, pt receptive. Pt more oriented this date and following commands consistently with less impulsivity noted. Pt continues to demonstrate significant balance deficits, unable to maintain full tandem stance with eyes open and with less stability with head turns/distraction in hallway. Pt continues to benefit from PT services to progress toward functional mobility goals.   Recommendations for follow up therapy are one component of a multi-disciplinary discharge planning process, led by the attending physician.  Recommendations may be updated based on patient status, additional functional criteria and insurance authorization.  Follow Up Recommendations  Outpatient PT     Assistance Recommended at Discharge Frequent or constant Supervision/Assistance  Patient can return home with the following A little help with walking and/or transfers;A little help with bathing/dressing/bathroom;Direct supervision/assist for medications management;Direct supervision/assist for financial management;Assist for transportation;Help with stairs or ramp for entrance   Equipment Recommendations  Rolling walker (2  wheels);Other (comment) (pt reports his cane was thrown away or stolen)    Recommendations for Other Services       Precautions / Restrictions Precautions Precautions: Fall Restrictions Weight Bearing Restrictions: No     Mobility  Bed Mobility               General bed mobility comments: received up in room    Transfers Overall transfer level: Needs assistance Equipment used: None Transfers: Sit to/from Stand Sit to Stand: Supervision           General transfer comment: cues for UE placement and safety from recliner and using RW; pt performed x5 reps reciprocal to reinforce safe UE placement with fair carryover    Ambulation/Gait Ambulation/Gait assistance: Supervision, Min guard Gait Distance (Feet): 400 Feet Assistive device: None Gait Pattern/deviations: Step-through pattern, Decreased step length - right, Decreased step length - left, Decreased stride length, Drifts right/left       General Gait Details: generally safe, a few episodes of balance deviation, poor heel/toe patterning, almost a steppage gait; mostly Supervision but intermittent min guard with head turns due to mild LOB       Balance Overall balance assessment: Needs assistance Sitting-balance support: Feet supported Sitting balance-Leahy Scale: Good     Standing balance support: No upper extremity supported Standing balance-Leahy Scale: Fair Standing balance comment: fair during gait but LOB with higher level balance tasks including standing box taps needing BUE support or head turns needing min guard    [place] - Eyes Opened: 30   High level balance activites: Other (comment) High Level Balance Comments: feet together, semi-tandem stance each LE >30 seconds each; pt unable to maintain full tandem stance >3 seconds without BUE support    Cognition Arousal/Alertness: Awake/alert Behavior During Therapy: WFL for tasks assessed/performed, Impulsive Overall Cognitive Status: Within  Functional Limits for tasks assessed                  General Comments: Pt oriented to self, location ("hospital", "[place]"), situation, following simple commands well today and less impulsive than in previous sessions with this therapist.        Exercises General Exercises - Lower Extremity Hip Flexion/Marching: Both, 10 reps, Seated Other Exercises Other Exercises: standing BLE box taps anterior x15 reps ea and laterally x10 reps ea with BUE support Other Exercises: STS x 5 reps reciprocal using UE support    General Comments General comments (skin integrity, edema, etc.): VSS per chart review, no acute s/sx distress, pt denies dizziness      Pertinent Vitals/Pain Pain Assessment Pain Assessment: No/denies pain Pain Descriptors / Indicators: Numbness (B hands and B feet "numb") Pain Intervention(s): Monitored during session     PT Goals (current goals can now be found in the care plan section) Acute Rehab PT Goals Patient Stated Goal: To go home and work PT Goal Formulation: With patient Time For Goal Achievement: 10/28/21 Progress towards PT goals: Progressing toward goals    Frequency    Min 3X/week      PT Plan Current plan remains appropriate    3   AM-PAC PT "6 Clicks" Mobility   Outcome Measure  Help needed turning from your back to your side while in a flat bed without using bedrails?: A Little Help needed moving from lying on your back to sitting on the side of a flat bed without using bedrails?: A Little Help needed moving to and from a bed to a chair (including a wheelchair)?: A Little Help needed standing up from a chair using your arms (e.g., wheelchair or bedside chair)?: A Little Help needed to walk in hospital room?: A Little Help needed climbing 3-5 steps with a railing? : A Lot 6 Click Score: 17    End of Session Equipment Utilized During Treatment: Gait belt Activity Tolerance: Patient tolerated treatment well Patient left: in  chair;with call bell/phone within reach;with chair alarm set Nurse Communication: Mobility status PT Visit Diagnosis: Other abnormalities of gait and mobility (R26.89);Unsteadiness on feet (R26.81);Repeated falls (R29.6)     Time: 8099-8338 PT Time Calculation (min) (ACUTE ONLY): 24 min  Charges:  $Gait Training: 8-22 mins $Therapeutic Exercise: 8-22 mins                     Chevette Fee P., PTA Acute Rehabilitation Services Secure Chat Preferred 9a-5:30pm Office: [place] 10/19/2021, 4:47 PM

## 2021-10-19 NOTE — TOC Progression Note (Signed)
Transition of Care [facility]) - Progression Note    Patient Details  Name: Samuel [name] MRN: [id number] Date of Birth: 04-26-1970  Transition of Care [facility]) CM/SW [place], RN Phone Number: 10/19/2021, 1:19 PM  Clinical Narrative:    Emailed Loistine Simas in regards to emergency medicaid.    Expected Discharge Plan: Home/Self Care Barriers to Discharge: Unsafe home situation, Inadequate or no insurance, Undocumented  Expected Discharge Plan and Services Expected Discharge Plan: Home/Self Care In-house Referral: Clinical Social Work Discharge Planning Services: CM Consult   Living arrangements for the past 2 months: [place]: [place] (charity) Date [place]: 10/05/21 Time [place]: 5170 Representative spoke with at [place]: Amy   Social Determinants of Health ([place]) Interventions Food Insecurity Interventions: Inpatient TOC Housing Interventions: Inpatient TOC Transportation Interventions: Inpatient TOC Utilities Interventions: Intervention Not Indicated  Readmission Risk Interventions    07/21/2020    2:26 PM  Readmission Risk Prevention Plan  Medication Screening Complete  Transportation Screening Complete

## 2021-10-19 NOTE — Progress Notes (Signed)
Mobility Specialist Progress Note:   10/19/21 0950  Mobility  Activity Transferred from bed to chair  Activity Response Tolerated well  Distance Ambulated (ft) 10 ft  $Mobility charge 1 Mobility  Level of Assistance Standby assist, set-up cues, supervision of patient - no hands on  Assistive Device None  Mobility Referral Yes   Pt received requesting to transfer to recliner. No physical assistance required, only minG for safety. Pt left in chair with all needs met.   Nelta Numbers Acute Rehab Secure Chat or Office Phone: [phone]

## 2021-10-19 NOTE — Progress Notes (Signed)
PROGRESS NOTE        PATIENT DETAILS Name: Samuel [name] Age: 51 y.o. Sex: male Date of Birth: [birth year] Admit Date: 09/29/2021 Admitting Physician Lequita Halt, MD [id number], No Pcp Per  Brief Summary: Patient is a 51 y.o.  male with longstanding history of EtOH use-presented with approximately 1 week history of confusion,tingling/numbness involving all 4 extremities and frequent falls.  He was admitted to the hospitalist service-started on a high-dose IV thiamine-hospital course was complicated by development of alcohol withdrawal symptoms.  He was stabilized CIWA protocol-however even after completion of 5 days of high-dose IV thiamine-his symptoms did not improve significantly.  See below for further details.    Significant events: 9/13>> admit to [facility]-frequent falls/tingling/numbness to all 4 extremities  Significant studies: 9/13>> CT head: No acute intracranial abnormalities 9/13>> CT C-spine: No fracture/dislocation 9/13>> CT T-spine: Sequelae of T7 compression and T10 Chance fractures-interval healing.  No acute osseous abnormality seen. 9/13>> CT L-spine: No acute osseous abnormality seen. 9/13>> CT chest: No acute traumatic injury.  Chronic right clavicle/scapula/bilateral rib fractures. 9/13>> CT abdomen/pelvis: No traumatic injury 9/13>> vitamin B12: 584 9/13>> vitamin B1: 33.9 (low) 9/14>> MRI brain: No acute intracranial abnormality. 9/14>> SPEP/light chain: No M spike 9/14>> HBsAg/anti-HCV Ab: Negative 9/14>> ANA positive, dsDNA slightly elevated at 13. 9/14>> A1c 5.4 9/14>> TSH: 11.067. 9/16>> FT4: 0.81 (normal) 9/21>> MRI C-spine: No acute abnormality.  Significant microbiology data: None  Procedures: None  Consults: None  Subjective:  Patient in bed, appears comfortable, denies any headache, no fever, no chest pain or pressure, no shortness of breath , no abdominal pain. No new focal  weakness.  Objective: Vitals: Blood pressure (!) 133/95, pulse (!) 113, temperature 98.3 F (36.8 C), temperature source Oral, resp. rate 16, height 5\' 4"  (1.626 m), weight 58.4 kg, SpO2 96 %.   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  Completed 5 days of high-dose IV thiamine-now on oral thiamine-some minimal improvement over the past few days-still confabulates and still confused.  Needs to stop drinking-unfortunately-not safe yet to discharge unless his mentation improves further, social worker following trying to see if we can locate family in [place] that can provide 24/7 care.  EtOH withdrawal: Withdrawal symptoms resolved-has longstanding history of alcohol use.  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    Hypomagnesemia & Hypokalemia: replaced again.  Normocytic anemia: No evidence of blood loss-probably due to alcohol use-bone marrow suppression.  But given neuropathy- SPEP/UPEP/light chains-ordered and pending.  Continue to follow CBC periodically.  Thrombocytopenia: Likely due to alcohol use-mild-follow periodically.  Elevated TSH with high free T4.  Sick euthyroid with stable free T4 and T3.  Repeat TSH in about 4 weeks time.  Lab Results  Component Value Date   TSH 9.616 (H) 10/18/2021    ?  Lupus: Unclear significance of slightly elevated dsDNA titers-not sure if this has any role in his neuropathic symptoms.  Will need outpatient follow-up with rheumatology.  BMI: Estimated body mass index is 22.1 kg/m as  calculated from  the following:   Height as of this encounter: 5\' 4"  (1.626 m).   Weight as of this encounter: 58.4 kg.   Code status:   Code Status: Full Code   DVT Prophylaxis: enoxaparin (LOVENOX) injection 40 mg Start: 09/29/21 1000 SCDs Start: 09/29/21 [id number]   Family Communication: None at bedside   Disposition Plan: Status is: Inpatient, he is medically stable await safe disposition per TOC.   Planned Discharge Destination: Likely SNF-social worker following.  Diet: Diet Order             Diet regular Room service appropriate? No; Fluid consistency: Thin  Diet effective now                     Antimicrobial agents: Anti-infectives (From admission, onward)    None        MEDICATIONS: Scheduled Meds:  enoxaparin (LOVENOX) injection  40 mg Subcutaneous [id number]   folic acid  1 mg Oral Daily   magnesium oxide  800 mg Oral BID   multivitamin with minerals  1 tablet Oral Daily   thiamine  100 mg Oral Daily   Continuous Infusions:  PRN Meds:.acetaminophen **OR** acetaminophen, LORazepam, ondansetron **OR** ondansetron (ZOFRAN) IV, senna-docusate   I have personally reviewed following labs and imaging studies  LABORATORY DATA:  Recent Labs  Lab 10/13/21 0700  WBC 14.8*  HGB 12.1*  HCT 34.3*  PLT 326  MCV 96.1  MCH 33.9  MCHC 35.3  RDW 14.7    Recent Labs  Lab 10/13/21 0700 10/14/21 0547 10/15/21 0403 10/16/21 0434 10/17/21 0648 10/18/21 0144  NA 136  --  134*  --   --  136  K 4.2  --  3.9  --   --  3.5  CL 105  --  104  --   --  103  CO2 24  --  24  --   --  23  GLUCOSE 94  --  96  --   --  124*  BUN 9  --  7  --   --  9  CREATININE 0.46*  --  0.55*  --   --  0.50*  CALCIUM 9.2  --  8.8*  --   --  8.9  MG 1.6* 1.7 1.6* 1.8 1.6* 2.0  PHOS  --   --  3.7  --   --   --   TSH  --   --  10.127* 10.307*  --  9.616*    RADIOLOGY STUDIES/RESULTS: No results found.   LOS: 20 days   Signature  Lala Lund M.D on 10/19/2021 at 10:27 AM   -  To page go  to [email or web address]

## 2021-10-20 NOTE — Progress Notes (Signed)
Mobility Specialist Progress Note:   10/20/21 0945  Mobility  Activity Ambulated independently to bathroom  Activity Response Tolerated well  Distance Ambulated (ft) 30 ft  $Mobility charge 1 Mobility  Level of Assistance Standby assist, set-up cues, supervision of patient - no hands on  Assistive Device None  Mobility Referral Yes   Pt received ambulating to BR. Agreeable to short room distance and sitting in recliner post ambulation. Pt left in chair with all needs met.     Acute Rehab Secure Chat or Office Phone: [phone]  

## 2021-10-20 NOTE — Progress Notes (Signed)
Occupational Therapy Treatment Patient Details Name: Samuel [name] MRN: [id number] DOB: Jun 12, 1970 Today's Date: 10/20/2021   History of present illness 51 yo male presents to [facility]13 with bilat hand and feet numbness, LE swelling, frequent falls. CTH negative for acute findings. Workup for Wernicke's encephalopathy. Recent admission 8/23 for syncope and withdrawals. PMH: alcohol abuse, fall 1 year ago resulting in subarachnoid hemorrhage and thoracic fractures, homelessness.   OT comments  Patient has made good progress toward all patient focused goals.  His left upper extremity remains weaker than the right, and he continues to have mild coordination deficits, but he is using bilateral upper extremities functionally.  His left leg also remains dysmetric, but he was able to mobilize in the room and in the halls without an AD, and completed 8 stairs with the use of handrails.  Patient needing setup and generalized supervision for ADL completion at a sit to stand level.  No further OT needs in the acute setting, and no significant post acute OT needs identified.  A referral to outpatient OT for his left upper extremity could be considered, if he has a ride, and outpatient PT is recommended.  Mobility team continues to see him, and encourage staff to allow him to continue completing his ADL from a generalized supervision level.      Recommendations for follow up therapy are one component of a multi-disciplinary discharge planning process, led by the attending physician.  Recommendations may be updated based on patient status, additional functional criteria and insurance authorization.    Follow Up Recommendations  No OT follow up    Assistance Recommended at Discharge Set up Supervision/Assistance  Patient can return home with the following  Assist for transportation;Direct supervision/assist for medications management;Help with stairs or ramp for entrance   Equipment  Recommendations  None recommended by OT    Recommendations for Other Services      Precautions / Restrictions Precautions Precautions: Fall Restrictions Weight Bearing Restrictions: No       Mobility Bed Mobility               General bed mobility comments: received up in room    Transfers Overall transfer level: Needs assistance Equipment used: None Transfers: Sit to/from Stand Sit to Stand: Modified independent (Device/Increase time)     Step pivot transfers: Supervision           Balance Overall balance assessment: Needs assistance Sitting-balance support: Feet supported Sitting balance-Leahy Scale: Good     Standing balance support: No upper extremity supported Standing balance-Leahy Scale: Fair                             ADL either performed or assessed with clinical judgement   ADL   Eating/Feeding: Independent;Sitting   Grooming: Set up;Standing   Upper Body Bathing: Set up;Sitting   Lower Body Bathing: Set up;Sit to/from stand   Upper Body Dressing : Set up;Sitting   Lower Body Dressing: Sit to/from stand;Set up   Toilet Transfer: Supervision/safety;Ambulation                  Extremity/Trunk Assessment Upper Extremity Assessment RUE Deficits / Details: improving coordination to in hand manipulation and grasp and release.  Continues with dysmetria, but able to use R UE functionally. RUE Sensation: WNL RUE Coordination: WNL LUE Deficits / Details: Patient is using the L UE as more of a stabilize assist.  He  can grasp, improved strength, but weaker than the R UE LUE Sensation: WNL LUE Coordination: decreased fine motor   Lower Extremity Assessment Lower Extremity Assessment: Defer to PT evaluation   Cervical / Trunk Assessment Cervical / Trunk Assessment: Normal    Vision   Vision Assessment?: No apparent visual deficits   Perception     Praxis      Cognition Arousal/Alertness: Awake/alert Behavior  During Therapy: WFL for tasks assessed/performed, Impulsive Overall Cognitive Status: Within Functional Limits for tasks assessed                                          Exercises      Shoulder Instructions       General Comments  VSS    Pertinent Vitals/ Pain       Pain Assessment Pain Assessment: Faces Faces Pain Scale: No hurt Pain Intervention(s): Monitored during session                                                          Frequency           Progress Toward Goals  OT Goals(current goals can now be found in the care plan section)  Progress towards OT goals: Goals met/education completed, patient discharged from [facility] All goals met and education completed, patient discharged from OT services    Co-evaluation                 AM-PAC OT "6 Clicks" Daily Activity     Outcome Measure   Help from another person eating meals?: None Help from another person taking care of personal grooming?: None Help from another person toileting, which includes using toliet, bedpan, or urinal?: A Little Help from another person bathing (including washing, rinsing, drying)?: A Little Help from another person to put on and taking off regular upper body clothing?: None Help from another person to put on and taking off regular lower body clothing?: A Little 6 Click Score: 21    End of Session    OT Visit Diagnosis: Unsteadiness on feet (R26.81);Ataxia, unspecified (R27.0)   Activity Tolerance Patient tolerated treatment well   Patient Left in chair;with call bell/phone within reach   Nurse Communication Mobility status        Time: 3536-1443 OT Time Calculation (min): 18 min  Charges: OT General Charges $OT Visit: 1 Visit OT Treatments $Self Care/Home Management : 8-22 mins  10/20/2021  RP, OTR/L  Acute Rehabilitation Services  Office:  [phone]   Metta Clines 10/20/2021, 10:04 AM

## 2021-10-20 NOTE — Progress Notes (Signed)
PROGRESS NOTE        PATIENT DETAILS Name: Samuel [name] Age: 51 y.o. Sex: male Date of Birth: 1970-04-25 Admit Date: 09/29/2021 Admitting Physician Lequita Halt, MD [id number], No Pcp Per  Brief Summary:  Patient is a 50 y.o.  male with longstanding history of EtOH use-presented with approximately 1 week history of confusion,tingling/numbness involving all 4 extremities and frequent falls.  He was admitted to the hospitalist service-started on a high-dose IV thiamine-hospital course was complicated by development of alcohol withdrawal symptoms.  He was stabilized CIWA protocol-however even after completion of 5 days of high-dose IV thiamine-his symptoms did not improve significantly.  See below for further details.    Significant events: 9/13>> admit to [facility]-frequent falls/tingling/numbness to all 4 extremities  Significant studies: 9/13>> CT head: No acute intracranial abnormalities 9/13>> CT C-spine: No fracture/dislocation 9/13>> CT T-spine: Sequelae of T7 compression and T10 Chance fractures-interval healing.  No acute osseous abnormality seen. 9/13>> CT L-spine: No acute osseous abnormality seen. 9/13>> CT chest: No acute traumatic injury.  Chronic right clavicle/scapula/bilateral rib fractures. 9/13>> CT abdomen/pelvis: No traumatic injury 9/13>> vitamin B12: 584 9/13>> vitamin B1: 33.9 (low) 9/14>> MRI brain: No acute intracranial abnormality. 9/14>> SPEP/light chain: No M spike 9/14>> HBsAg/anti-HCV Ab: Negative 9/14>> ANA positive, dsDNA slightly elevated at 13. 9/14>> A1c 5.4 9/14>> TSH: 11.067. 9/16>> FT4: 0.81 (normal) 9/21>> MRI C-spine: No acute abnormality.  Significant microbiology data: None  Procedures: None  Consults: None  Subjective:   No significant events overnight, he denies any complaints today  Objective: Vitals: Blood pressure (!) 131/98, pulse (!) 112, temperature 98.5 F (36.9 C), temperature  source Oral, resp. rate 14, height 5\' 4"  (1.626 m), weight 55.2 kg, SpO2 97 %.   Exam:  Awake Alert, communicative, pleasant Symmetrical Chest wall movement, Good air movement bilaterally, CTAB RRR,No Gallops,Rubs or new Murmurs, No Parasternal Heave +ve B.Sounds, Abd Soft, No tenderness, No rebound - guarding or rigidity. 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  Completed 5 days of high-dose IV thiamine-now on oral thiamine-some minimal improvement over the past few days-still confabulates and still confused.  Needs to stop drinking-unfortunately-not safe yet to discharge unless his mentation improves further, social worker following trying to see if we can locate family in [place] that can provide 24/7 care.  EtOH withdrawal: Withdrawal symptoms resolved-has longstanding history of alcohol use.  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    Hypomagnesemia & Hypokalemia: replaced again.  Normocytic anemia: No evidence of blood loss-probably due to alcohol use-bone marrow suppression.  But given neuropathy- SPEP/UPEP/light chains-ordered and pending.  Continue to follow CBC periodically.  Thrombocytopenia: Likely due to alcohol use-mild-follow periodically.  Elevated TSH with high free T4.  Sick euthyroid with stable free T4 and T3.  Repeat TSH in about 4 weeks time.  Lab Results  Component Value Date   TSH 9.616 (H) 10/18/2021    ?  Lupus: Unclear significance of slightly elevated dsDNA titers-not sure if this has any role in his neuropathic symptoms.  Will need outpatient follow-up with rheumatology.  BMI: Estimated body mass index is 20.89 kg/m as calculated from the following:   Height as of this encounter: 5\' 4"  (1.626 m).  Weight as of this  encounter: 55.2 kg.   Code status:   Code Status: Full Code   DVT Prophylaxis: enoxaparin (LOVENOX) injection 40 mg Start: 09/29/21 1000 SCDs Start: 09/29/21 3329   Family Communication: None at bedside   Disposition Plan: Status is: Inpatient, he is medically stable await safe disposition per TOC.   Planned Discharge Destination: Likely SNF-social worker following.  Diet: Diet Order             Diet regular Room service appropriate? No; Fluid consistency: Thin  Diet effective now                     Antimicrobial agents: Anti-infectives (From admission, onward)    None        MEDICATIONS: Scheduled Meds:  enoxaparin (LOVENOX) injection  40 mg Subcutaneous Q24H   folic acid  1 mg Oral Daily   magnesium oxide  800 mg Oral BID   multivitamin with minerals  1 tablet Oral Daily   thiamine  100 mg Oral Daily   Continuous Infusions:  PRN Meds:.acetaminophen **OR** acetaminophen, LORazepam, ondansetron **OR** ondansetron (ZOFRAN) IV, senna-docusate   I have personally reviewed following labs and imaging studies  LABORATORY DATA:  No results for input(s): "WBC", "HGB", "HCT", "PLT", "MCV", "MCH", "MCHC", "RDW", "LYMPHSABS", "MONOABS", "EOSABS", "BASOSABS", "BANDABS" in the last 168 hours.  Invalid input(s): "NEUTRABS", "BANDSABD"   Recent Labs  Lab 10/14/21 0547 10/15/21 0403 10/16/21 0434 10/17/21 0648 10/18/21 0144  NA  --  134*  --   --  136  K  --  3.9  --   --  3.5  CL  --  104  --   --  103  CO2  --  24  --   --  23  GLUCOSE  --  96  --   --  124*  BUN  --  7  --   --  9  CREATININE  --  0.55*  --   --  0.50*  CALCIUM  --  8.8*  --   --  8.9  MG 1.7 1.6* 1.8 1.6* 2.0  PHOS  --  3.7  --   --   --   TSH  --  10.127* 10.307*  --  9.616*    RADIOLOGY STUDIES/RESULTS: No results found.   LOS: 21 days   Signature  Huey Bienenstock M.D on 10/20/2021 at 12:54 PM   -  To page go to [email or web address]

## 2021-10-21 ENCOUNTER — Other Ambulatory Visit (HOSPITAL_COMMUNITY): Payer: Self-pay

## 2021-10-21 MED ORDER — FOLIC ACID 1 MG PO TABS
1.0000 mg | ORAL_TABLET | Freq: Every day | ORAL | 0 refills | Status: DC
  Filled 2021-10-21: qty 30, 30d supply, fill #0

## 2021-10-21 MED ORDER — THIAMINE HCL 100 MG PO TABS
100.0000 mg | ORAL_TABLET | Freq: Every day | ORAL | 0 refills | Status: DC
  Filled 2021-10-21: qty 30, 30d supply, fill #0

## 2021-10-21 MED ORDER — LEVOTHYROXINE SODIUM 50 MCG PO TABS
50.0000 ug | ORAL_TABLET | Freq: Every day | ORAL | 0 refills | Status: DC
  Filled 2021-10-21: qty 30, 30d supply, fill #0

## 2021-10-21 MED ORDER — MAGNESIUM OXIDE 400 MG PO TABS
800.0000 mg | ORAL_TABLET | Freq: Two times a day (BID) | ORAL | 0 refills | Status: AC
  Filled 2021-10-21: qty 60, 15d supply, fill #0

## 2021-10-21 MED ORDER — ADULT MULTIVITAMIN W/MINERALS CH
1.0000 | ORAL_TABLET | Freq: Every day | ORAL | 0 refills | Status: AC
  Filled 2021-10-21: qty 30, 30d supply, fill #0

## 2021-10-21 NOTE — Discharge Instructions (Signed)
Follow with Primary MD  in 7 days   Get CBC, CMP  Activity: As tolerated with Full fall precautions use walker/cane & assistance as needed   Disposition Home   Diet: Heart Healthy    On your next visit with your primary care physician please Get Medicines reviewed and adjusted.   Please request your Prim.MD to go over all Hospital Tests and Procedure/Radiological results at the follow up, please get all Hospital records sent to your Prim MD by signing hospital release before you go home.   If you experience worsening of your admission symptoms, develop shortness of breath, life threatening emergency, suicidal or homicidal thoughts you must seek medical attention immediately by calling 911 or calling your MD immediately  if symptoms less severe.  You Must read complete instructions/literature along with all the possible adverse reactions/side effects for all the Medicines you take and that have been prescribed to you. Take any new Medicines after you have completely understood and accpet all the possible adverse reactions/side effects.   Do not drive, operating heavy machinery, perform activities at heights, swimming or participation in water activities or provide baby sitting services if your were admitted for syncope or siezures until you have seen by Primary MD or a Neurologist and advised to do so again.  Do not drive when taking Pain medications.    Do not take more than prescribed Pain, Sleep and Anxiety Medications  Special Instructions: If you have smoked or chewed Tobacco  in the last 2 yrs please stop smoking, stop any regular Alcohol  and or any Recreational drug use.  Wear Seat belts while driving.   Please note  You were cared for by a hospitalist during your hospital stay. If you have any questions about your discharge medications or the care you received while you were in the hospital after you are discharged, you can call the unit and asked to speak with the  hospitalist on call if the hospitalist that took care of you is not available. Once you are discharged, your primary care physician will handle any further medical issues. Please note that NO REFILLS for any discharge medications will be authorized once you are discharged, as it is imperative that you return to your primary care physician (or establish a relationship with a primary care physician if you do not have one) for your aftercare needs so that they can reassess your need for medications and monitor your lab values.

## 2021-10-21 NOTE — TOC Progression Note (Addendum)
Transition of Care [facility]) - Progression Note    Patient Details  Name: Samuel [name] MRN: [id number] Date of Birth: 1970-05-18  Transition of Care [facility]) CM/SW [place], RN Phone Number: 10/21/2021, 1:04 PM  Clinical Narrative:    Tobey Grim listed as NOK/ contact about discharge planning   She  only speaks a little english .  [place] interpreter utilized to speak to [facility]. She states that she will take him back to her home , as he has no place to go. She cannot watch him 24/7, " if he is going to drink again I cannot stop him"  She can pick him up from the hospital   [place] ordered for patient to take home. Updated MD and CSW on call.   Ordered rolling walker from [facility].  Messaged Amy from [facility] to make sure they are accepting for services PT OT 1355 called  friend maria via interpreter line, no answer, mailbox has not been set up, to let her know that the patient will be discharged this afternoon.  Texted her number in Spanish to let her know that the patient will be DC this afternoon. And that [facility] home [facility] will try to set up via Alvis Lemmings for PT OT and social work visit.   Expected Discharge Plan: Home/Self Care Barriers to Discharge: Unsafe home situation, Inadequate or no insurance, Undocumented  Expected Discharge Plan and Services Expected Discharge Plan: Home/Self Care In-house Referral: Clinical Social Work Discharge Planning Services: CM Consult   Living arrangements for the past 2 months: [place]: [place] (charity) Date [place]: 10/05/21 Time [place]: [id number] Representative spoke with at [place]: Amy   Social Determinants of Health ([place]) Interventions Food Insecurity Interventions: Inpatient TOC Housing Interventions: Inpatient TOC Transportation Interventions: Inpatient TOC Utilities Interventions: Intervention Not  Indicated  Readmission Risk Interventions    07/21/2020    2:26 PM  Readmission Risk Prevention Plan  Medication Screening Complete  Transportation Screening Complete

## 2021-10-21 NOTE — Progress Notes (Signed)
Patient discharged home with two friends. IV removed, interpreter used to explain discharge instructions. TOC medications given to patient. Patient had no questions at time of discharge.

## 2021-10-21 NOTE — Progress Notes (Signed)
Mobility Specialist Progress Note:   10/21/21 1130  Mobility  Activity Ambulated independently in hallway;Ambulated with assistance in hallway  Activity Response Tolerated well  Distance Ambulated (ft) 300 ft  $Mobility charge 1 Mobility  Level of Assistance Standby assist, set-up cues, supervision of patient - no hands on  Assistive Device Front wheel walker;None  Mobility Referral Yes   Pt agreeable to mobility session. Ambulated 19f with RW and supervision, 1544fwith no AD and minG. Pt left in bed with all needs met.   [facility] Secure Chat or Office Phone: [phone]

## 2021-10-21 NOTE — Discharge Summary (Signed)
Physician Discharge Summary  Samuel [name] WUJ:[id number] DOB: 1970-04-30 DOA: 09/29/2021  PCP: Patient, No Pcp Per  Admit date: 09/29/2021 Discharge date: 10/21/2021  Admitted From: Home Disposition: Home, he is discharged to stay with [name], and okay, he used to live with her before he was admitted to the hospital.  Recommendations for Outpatient Follow-up:  Follow up with PCP in 1-2 weeks Please obtain BMP/CBC in one week Please recheck TSH in 6 weeks.     Discharge Condition:Stable CODE STATUS:FULL Diet recommendation: Regular  Brief/Interim Summary:  Patient is a 51 y.o.  male with longstanding history of EtOH use-presented with approximately 1 week history of confusion,tingling/numbness involving all 4 extremities and frequent falls.  He was admitted to the hospitalist service-started on a high-dose IV thiamine-hospital course was complicated by development of alcohol withdrawal symptoms.  He was stabilized CIWA protocol-however even after completion of 5 days of high-dose IV thiamine-his symptoms did not improve significantly.  See below for further details.     Significant events: 9/13>> admit to [facility]-frequent falls/tingling/numbness to all 4 extremities   Significant studies: 9/13>> CT head: No acute intracranial abnormalities 9/13>> CT C-spine: No fracture/dislocation 9/13>> CT T-spine: Sequelae of T7 compression and T10 Chance fractures-interval healing.  No acute osseous abnormality seen. 9/13>> CT L-spine: No acute osseous abnormality seen. 9/13>> CT chest: No acute traumatic injury.  Chronic right clavicle/scapula/bilateral rib fractures. 9/13>> CT abdomen/pelvis: No traumatic injury 9/13>> vitamin B12: 584 9/13>> vitamin B1: 33.9 (low) 9/14>> MRI brain: No acute intracranial abnormality. 9/14>> SPEP/light chain: No M spike 9/14>> HBsAg/anti-HCV Ab: Negative 9/14>> ANA positive, dsDNA slightly elevated at 13. 9/14>> A1c 5.4 9/14>> TSH:  11.067. 9/16>> FT4: 0.81 (normal) 9/21>> MRI C-spine: No acute abnormality.  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  Completed 5 days of high-dose IV thiamine-now on oral thiamine-some minimal improvement over the past few days-still confabulates and still confused.   - Needs to stop drinking -Will be discharged to [name], where he used to live before admission to the hospital, medication including his supplements of thiamine, multivitamins, folic acid has been dispensed from [facility]   EtOH withdrawal:  - Withdrawal symptoms resolved-has longstanding history of alcohol use.    Hypomagnesemia & Hypokalemia: Replaced   Normocytic anemia: No evidence of blood loss-probably due to alcohol use-bone marrow suppression.   Thrombocytopenia: Likely due to alcohol use, improved   Elevated TSH with high free T4.  Sick euthyroid with stable free T4 and T3.  Repeat TSH in about 4 weeks time.   Recent Labs       Lab Results  Component Value Date    TSH 9.616 (H) 10/18/2021        ?  Lupus: Unclear significance of slightly elevated dsDNA titers-not sure if this has any role in his neuropathic symptoms.  Will need outpatient follow-up with rheumatology.   BMI: Estimated body mass index is 20.89 kg/m as calculated from the following:   Height as of this encounter: 5\' 4"  (1.626 m).   Weight as of this encounter: 55.2 kg.      Discharge Diagnoses:  Principal Problem:   Generalized weakness Active Problems:   Wernicke's encephalopathy   Encephalopathy    Discharge Instructions  Discharge Instructions     Diet - low sodium heart healthy   Complete by: As directed    Discharge instructions   Complete by: As directed    Follow with Primary MD  in 7 days   Get CBC, CMP  Activity: As tolerated with Full fall precautions use walker/cane & assistance as  needed   Disposition Home   Diet: Heart Healthy    On your next visit with your primary care physician please Get Medicines reviewed and adjusted.   Please request your Prim.MD to go over all Hospital Tests and Procedure/Radiological results at the follow up, please get all Hospital records sent to your Prim MD by signing hospital release before you go home.   If you experience worsening of your admission symptoms, develop shortness of breath, life threatening emergency, suicidal or homicidal thoughts you must seek medical attention immediately by calling 911 or calling your MD immediately  if symptoms less severe.  You Must read complete instructions/literature along with all the possible adverse reactions/side effects for all the Medicines you take and that have been prescribed to you. Take any new Medicines after you have completely understood and accpet all the possible adverse reactions/side effects.   Do not drive, operating heavy machinery, perform activities at heights, swimming or participation in water activities or provide baby sitting services if your were admitted for syncope or siezures until you have seen by Primary MD or a Neurologist and advised to do so again.  Do not drive when taking Pain medications.    Do not take more than prescribed Pain, Sleep and Anxiety Medications  Special Instructions: If you have smoked or chewed Tobacco  in the last 2 yrs please stop smoking, stop any regular Alcohol  and or any Recreational drug use.  Wear Seat belts while driving.   Please note  You were cared for by a hospitalist during your hospital stay. If you have any questions about your discharge medications or the care you received while you were in the hospital after you are discharged, you can call the unit and asked to speak with the hospitalist on call if the hospitalist that took care of you is not available. Once you are discharged, your primary care physician will  handle any further medical issues. Please note that NO REFILLS for any discharge medications will be authorized once you are discharged, as it is imperative that you return to your primary care physician (or establish a relationship with a primary care physician if you do not have one) for your aftercare needs so that they can reassess your need for medications and monitor your lab values.   Increase activity slowly   Complete by: As directed       Allergies as of 10/21/2021   No Known Allergies      Medication List     STOP taking these medications    acetaminophen 325 MG tablet Commonly known as: TYLENOL       TAKE these medications    CertaVite/Antioxidants Tabs Tome 1 tableta por va oral diariamente. (Take 1 tablet by mouth daily.) Start taking on: October 22, 2021   folic acid 1 MG tablet Commonly known as: FOLVITE Tome 1 tableta (1 mg en total) por va oral diariamente. (Take 1 tablet (1 mg total) by mouth daily.) Start taking on: October 22, 2021   ibuprofen 600 MG tablet Commonly known as: ADVIL Take 1 tablet (600 mg total) by mouth every 6 (six) hours as needed.   levothyroxine 50 MCG tablet Commonly known as: Synthroid Take 1 tablet (50 mcg total) by mouth daily.   magnesium oxide 400 MG tablet Commonly known as: MAG-OX Take 2 tablets (800 mg total) by  mouth 2 (two) times daily.   thiamine 100 MG tablet Commonly known as: VITAMIN B1 Tome 1 tableta (100 mg en total) por va oral diariamente. (Take 1 tablet (100 mg total) by mouth daily.) Start taking on: October 22, 2021               Durable Medical Equipment  (From admission, onward)           Start     Ordered   10/21/21 1302  For home use only DME Walker rolling  Once       Question Answer Comment  Walker: With 5 Inch Wheels   Patient needs a walker to treat with the following condition Weakness      10/21/21 1301            Follow-up Information     [facility]. Call on 11/19/2021.   Why: 1430 with Dr Laural Benes. [facility] have primary care MD , finacial counelling and a pharmacy Contact information: [address] [place] [address] [phone]               No Known Allergies  Consultations: Neurology   Procedures/Studies: MR CERVICAL SPINE W WO CONTRAST  Result Date: 10/07/2021 CLINICAL DATA:  Initial evaluation for ataxia, cervical pathology suspected. EXAM: MRI CERVICAL SPINE WITHOUT AND WITH CONTRAST TECHNIQUE: Multiplanar and multiecho pulse sequences of the cervical spine, to include the craniocervical junction and cervicothoracic junction, were obtained without and with intravenous contrast. CONTRAST:  5.4 cc of Vueway. COMPARISON:  Prior CT from 09/29/2021. FINDINGS: Alignment: Straightening of the normal cervical lordosis. No listhesis. Vertebrae: Vertebral body height maintained without acute or chronic fracture. Bone marrow signal intensity within normal limits. No discrete or worrisome osseous lesions. No abnormal marrow edema or enhancement. Cord: Normal signal and morphology.  No abnormal enhancement. 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 Craniocervical junction normal. Paraspinous soft tissues within normal limits. Normal flow voids seen within the vertebral arteries bilaterally. Disc levels: C2-C3: Negative interspace. Mild facet hypertrophy. No canal or foraminal stenosis. C3-C4: Central disc protrusion indents the ventral thecal sac. No significant stenosis or frank cord impingement. Superimposed mild uncovertebral spurring without significant foraminal stenosis. C4-C5: Small left paracentral disc protrusion indents the ventral thecal sac. No significant spinal stenosis or cord impingement. Foramina remain patent. C5-C6: Mild disc bulge with endplate and  uncovertebral spurring. Flattening and partial effacement of the ventral thecal sac without significant spinal stenosis. Mild bilateral C6 foraminal narrowing. C6-C7: Mild disc bulge with bilateral uncovertebral spurring, slightly asymmetric to the right. No significant spinal stenosis. Moderate right worse than left C7 foraminal narrowing. C7-T1: Mild disc bulge. Mild bilateral facet hypertrophy. No significant stenosis. Visualized upper thoracic spine demonstrates no significant finding. IMPRESSION: 1. No acute abnormality within the cervical spine or spinal cord. 2. 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 Finding could contribute to ataxia. 3. Multilevel degenerative spondylosis at without significant spinal stenosis or frank cord impingement. Mild bilateral C6 foraminal narrowing, with moderate right worse than left C7 foraminal stenosis related to disc bulge and uncovertebral disease. Electronically Signed   By: Rise Mu M.D.   On: 10/07/2021 04:15   VAS [place] LOWER EXTREMITY VENOUS (DVT)  Result Date: 09/30/2021  Lower Venous DVT Study Patient Name:  Samuel [name]  Date of Exam:  09/29/2021 Medical Rec #: [id number]                        Accession #:    [id number] Date of Birth: [birth year]                        Patient Gender: M Patient Age:   35 years Exam Location:  [facility] Procedure:      VAS [place] LOWER EXTREMITY VENOUS (DVT) Referring Phys: Mikey College --------------------------------------------------------------------------------  Indications: Edema.  Comparison Study: No prior studies. Performing Technologist: Jean Rosenthal RDMS, RVT  Examination Guidelines: A complete evaluation includes B-mode imaging, spectral Doppler, color Doppler, and power Doppler as needed of all accessible portions of each vessel. Bilateral testing is considered an integral part of a complete examination. Limited examinations for reoccurring  indications may be performed as noted. The reflux portion of the exam is performed with the patient in reverse Trendelenburg.  +---------+---------------+---------+-----------+----------+--------------+ RIGHT    CompressibilityPhasicitySpontaneityPropertiesThrombus Aging +---------+---------------+---------+-----------+----------+--------------+ CFV      Full           Yes      Yes                                 +---------+---------------+---------+-----------+----------+--------------+ SFJ      Full                                                        +---------+---------------+---------+-----------+----------+--------------+ FV Prox  Full                                                        +---------+---------------+---------+-----------+----------+--------------+ FV Mid   Full                                                        +---------+---------------+---------+-----------+----------+--------------+ FV DistalFull                                                        +---------+---------------+---------+-----------+----------+--------------+ PFV      Full                                                        +---------+---------------+---------+-----------+----------+--------------+ POP      Full           Yes      Yes                                 +---------+---------------+---------+-----------+----------+--------------+ PTV  Full                                                        +---------+---------------+---------+-----------+----------+--------------+ PERO     Full                                                        +---------+---------------+---------+-----------+----------+--------------+   +---------+---------------+---------+-----------+----------+--------------+ LEFT     CompressibilityPhasicitySpontaneityPropertiesThrombus Aging  +---------+---------------+---------+-----------+----------+--------------+ CFV      Full           Yes      Yes                                 +---------+---------------+---------+-----------+----------+--------------+ SFJ      Full                                                        +---------+---------------+---------+-----------+----------+--------------+ FV Prox  Full                                                        +---------+---------------+---------+-----------+----------+--------------+ FV Mid   Full                                                        +---------+---------------+---------+-----------+----------+--------------+ FV DistalFull                                                        +---------+---------------+---------+-----------+----------+--------------+ PFV      Full                                                        +---------+---------------+---------+-----------+----------+--------------+ POP      Full           Yes      Yes                                 +---------+---------------+---------+-----------+----------+--------------+ PTV      Full                                                        +---------+---------------+---------+-----------+----------+--------------+  PERO     Full                                                        +---------+---------------+---------+-----------+----------+--------------+     Summary: RIGHT: - There is no evidence of deep vein thrombosis in the lower extremity.  - No cystic structure found in the popliteal fossa.  LEFT: - There is no evidence of deep vein thrombosis in the lower extremity.  - No cystic structure found in the popliteal fossa.  *See table(s) above for measurements and observations. Electronically signed by Heath Lark on 09/30/2021 at 4:20:03 PM.    Final    MR BRAIN WO CONTRAST  Result Date: 09/30/2021 CLINICAL DATA:  Worsening numbness and  weakness in arms and legs. Frequent falls and leg swelling. EXAM: MRI HEAD WITHOUT CONTRAST TECHNIQUE: Multiplanar, multiecho pulse sequences of the brain and surrounding structures were obtained without intravenous contrast. COMPARISON:  CT head 1 day prior. FINDINGS: Brain: There is no acute intracranial hemorrhage, extra-axial fluid collection, or acute infarct There is mild cerebral and cerebellar small foci of FLAIR signal abnormality in the supratentorial white matter are nonspecific but likely reflects sequela of mild chronic small vessel ischemic change. There is a small focus of cortical encephalomalacia in the left temporoparietal region, unchanged. 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 There is no suspicious parenchymal signal abnormality. There is no mass lesion. There is no mass effect or midline shift. Volume loss with prominence of the ventricular system and extra-axial CSF spaces. Vascular: Normal flow voids. Skull and upper cervical spine: Normal marrow signal. Sinuses/Orbits: The paranasal sinuses are clear. The globes and orbits are unremarkable. Other: None. IMPRESSION: 1. No acute intracranial pathology. 2. Stable chronic findings as above. Electronically Signed   By: Lesia Hausen M.D.   On: 09/30/2021 13:04   CT L-SPINE NO CHARGE  Result Date: 09/29/2021 CLINICAL DATA:  51 year old homeless male with a history of fall. Extremity swelling, pain, numbness. History of T7 and T10 fractures in 2022. EXAM: CT LUMBAR SPINE WITH CONTRAST TECHNIQUE: Technique: Multiplanar CT images of the lumbar spine were reconstructed from contemporary CT of the Abdomen and Pelvis. RADIATION DOSE REDUCTION: This exam was performed according to the departmental dose-optimization program which includes automated exposure control, adjustment of the mA and/or kV according to patient size and/or use of iterative reconstruction  technique. CONTRAST:  No additional COMPARISON:  CT thoracic spine, CT Chest, Abdomen, and Pelvis today reported separately. CT Abdomen and Pelvis 06/17/2018. FINDINGS: Segmentation: Normal, concordant with the thoracic spine numbering today. Alignment: Stable to improved lumbar lordosis compared to the 2020 CT. No spondylolisthesis or scoliosis. Vertebrae: Lumbar vertebrae appear stable and intact. Bone mineralization is within normal limits. Intact visible sacrum and SI joints. Paraspinal and other soft tissues: Abdominal and pelvic viscera are detailed separately. Lumbar paraspinal soft tissues are within normal limits. Disc levels: Mild lumbar disc bulging does not appear significantly changed since 2020. Some superimposed lumbar epidural lipomatosis (L5-S1). Trace vacuum disc at L2-L3. But otherwise no CT evidence of lumbar spinal stenosis. IMPRESSION: 1. No acute osseous abnormality in the lumbar spine. 2. Mild lumbar spine degeneration appears stable since 2020 with no CT evidence of spinal stenosis. 3. CT Chest, Abdomen, and Pelvis today are  reported separately. Electronically Signed   By: Odessa Fleming M.D.   On: 09/29/2021 05:47   CT T-SPINE NO CHARGE  Result Date: 09/29/2021 CLINICAL DATA:  51 year old homeless male with a history of fall. Extremity swelling, pain, numbness. History of T7 and T10 fractures in 2022. EXAM: CT THORACIC SPINE WITHOUT CONTRAST TECHNIQUE: Multidetector CT images of the thoracic were obtained using the standard protocol without intravenous contrast. RADIATION DOSE REDUCTION: This exam was performed according to the departmental dose-optimization program which includes automated exposure control, adjustment of the mA and/or kV according to patient size and/or use of iterative reconstruction technique. COMPARISON:  CT cervical spine, chest, Abdomen, and Pelvis today are reported separately. Thoracic spine CT 07/02/2020.  Thoracic spine MRI 07/03/2020. FINDINGS: Limited cervical  spine imaging: Cervicothoracic junction alignment is within normal limits. Thoracic spine segmentation: Normal. This is the same numbering system used last year. Alignment: Some posttraumatic exaggeration of thoracic kyphosis compared to 2022. No significant spondylolisthesis. Mild dextroconvex thoracic scoliosis is chronic and stable. Vertebrae: Chronic T7 and T10 vertebral body fractures demonstrate some additional loss of height since last year. T10 vertebral chance type fracture previously with interval healing of the horizontal posterior element fracture plane. Focal kyphosis at T10 with maintained posterior element alignment. T1 through T6 appears stable and intact. T8 and T9 appears stable and intact. T11 and T12 appears stable and intact. Chronic bilateral 1st rib, And right 2nd and 3rd rib costovertebral junction fractures. No acute osseous abnormality identified. Paraspinal and other soft tissues: Chest and abdominal viscera are detailed separately today. Thoracic paraspinal soft tissues are within normal limits. Disc levels: Some thoracic disc and endplate degeneration associated with the T7 and T10 vertebral trauma. But no CT evidence of thoracic spinal stenosis. IMPRESSION: 1. Sequelae of 2022 T7 compression and T10 Chance Fractures. Interval healing with some loss of height at both levels. Subsequent focal kyphosis at T10. 2. No associated thoracic spinal stenosis by CT. And no acute osseous abnormality in the thoracic spine. 3.  CT Chest, Abdomen, and Pelvis today are reported separately. Electronically Signed   By: Odessa Fleming M.D.   On: 09/29/2021 05:43   CT CHEST ABDOMEN PELVIS W CONTRAST  Result Date: 09/29/2021 CLINICAL DATA:  51 year old homeless male with a history of fall. Extremity swelling, pain, numbness. History of T7 and T10 fractures in 2022. EXAM: CT CHEST, ABDOMEN, AND PELVIS WITH CONTRAST TECHNIQUE: Multidetector CT imaging of the chest, abdomen and pelvis was performed following the  standard protocol during bolus administration of intravenous contrast. RADIATION DOSE REDUCTION: This exam was performed according to the departmental dose-optimization program which includes automated exposure control, adjustment of the mA and/or kV according to patient size and/or use of iterative reconstruction technique. CONTRAST:  75mL OMNIPAQUE IOHEXOL 350 MG/ML SOLN COMPARISON:  CT thoracic and lumbar spine today are detailed separately. CT Chest, Abdomen, and Pelvis 07/02/2020. FINDINGS: CT CHEST FINDINGS Cardiovascular: Borderline to mild cardiomegaly does not appear significantly changed from last year. No pericardial effusion. Negative thoracic aorta. No calcified coronary artery atherosclerosis is evident. The central pulmonary arteries are enhancing and grossly patent. Mediastinum/Nodes: No mediastinal mass or lymphadenopathy. There are chronic densely calcified small left hilar and AP window lymph nodes which are postinflammatory, post granulomatous and stable. Lungs/Pleura: Major airways are patent. Low lung volumes similar to the chest CT last year. Curvilinear scarring along the right minor fissure appears increased. Superimposed areas of patchy and dependent atelectasis as well as gas trapping resulting in mosaic attenuation. No consolidation.  No pleural effusion. No lung mass identified. Musculoskeletal: Thoracic spine is detailed separately. Chronic right clavicle, scapula, and bilateral rib fractures. Intact sternum. CT ABDOMEN PELVIS FINDINGS Hepatobiliary: Pronounced gallbladder layering sludge or nonshadowing gravel type stones on series 4, image 54. Mild gallbladder wall thickening, and possible early pericholecystic inflammation on series 7, image 40. Enhancement of the adjacent liver is within normal limits and hepatic steatosis has regressed from last year. No bile duct dilatation. Pancreas: Pancreas is within normal limits. Spleen: Negative. Adrenals/Urinary Tract: Normal adrenal glands.  Nonobstructed kidneys with symmetric renal enhancement. No nephrolithiasis or pararenal inflammation. No renal mass. Urinary bladder is distended but otherwise unremarkable. No hydroureter. Stomach/Bowel: Redundant large bowel with retained low-density stool throughout. Normal appendix tracks toward the midline on coronal image 57. No large bowel inflammation identified. Negative terminal ileum. Fluid-filled but nondilated small bowel in the abdomen and pelvis. Stomach and duodenum are decompressed. No free air or free fluid. Vascular/Lymphatic: Major arterial structures in the abdomen and pelvis are patent with no significant atherosclerosis. Portal venous system is patent. No lymphadenopathy identified. Reproductive: Negative. Other: No pelvic free fluid. Musculoskeletal: Lumbar spine is detailed separately. Sacrum, SI joints, pelvis, and proximal right femur appear intact. Chronic deformity of the proximal left femur is stable. IMPRESSION: CHEST: 1. Chronic posttraumatic and mild post granulomatous changes in the chest. 2. Low lung volumes with pulmonary scarring and atelectasis. 3. Thoracic spine is detailed separately. ABDOMEN AND PELVIS: 1. Gallbladder layering sludge with mild gallbladder wall thickening and questionable early gallbladder inflammation. Chronic cholecystitis is possible but cannot exclude Acute Cholecystitis. Right Upper Quadrant Ultrasound recommended if there is right upper quadrant pain. 2. Distended gallbladder, query urinary retention. No associated hydronephrosis or hydroureter. 3. No other acute or inflammatory process identified in the abdomen or pelvis. 4. Lumbar spine is detailed separately. Electronically Signed   By: Odessa Fleming M.D.   On: 09/29/2021 05:37   CT CERVICAL SPINE WO CONTRAST  Result Date: 09/29/2021 CLINICAL DATA:  51 year old homeless male with a history of fall. Extremity swelling, pain, numbness. History of T7 and T10 fractures in 2022. EXAM: CT CERVICAL SPINE  WITHOUT CONTRAST TECHNIQUE: Multidetector CT imaging of the cervical spine was performed without intravenous contrast. Multiplanar CT image reconstructions were also generated. RADIATION DOSE REDUCTION: This exam was performed according to the departmental dose-optimization program which includes automated exposure control, adjustment of the mA and/or kV according to patient size and/or use of iterative reconstruction technique. COMPARISON:  Cervical spine CT 09/02/2021. Cervical spine MRI 07/03/2020. FINDINGS: Alignment: Chronic straightening of cervical lordosis. Cervicothoracic junction alignment is within normal limits. Bilateral posterior element alignment is within normal limits. Skull base and vertebrae: Visualized skull base is intact. No atlanto-occipital dissociation. C1 and C2 appear intact and aligned. No acute osseous abnormality identified. Soft tissues and spinal canal: No prevertebral fluid or swelling. No visible canal hematoma. Some calcified carotid atherosclerosis. Otherwise negative visible noncontrast neck soft tissues. Disc levels: Multilevel cervical spine disc bulging with endplate spurring. This appears not significantly changed from June 2022 when MRI demonstrated no significant spinal stenosis. Upper chest: Chronic left 1st rib fracture. Chronic right clavicle fracture. Chronic posterior right 2nd and 3rd rib costovertebral junction fractures. Chest CT today is reported separately. IMPRESSION: 1. No acute traumatic injury identified in the cervical spine. 2. Stable cervical spine degeneration, with no associated cervical spinal stenosis by MRI last year. 3. Chronic upper rib and clavicle fractures. Chest CT today is reported separately. Electronically Signed   By: Rexene Edison  [place] Crane M.D.   On: 09/29/2021 05:27   CT HEAD WO CONTRAST (5MM)  Result Date: 09/29/2021 CLINICAL DATA:  51 year old homeless male with a history of fall. Extremity swelling, pain, numbness. History of T7 and T10 fractures  in 2022. EXAM: CT HEAD WITHOUT CONTRAST TECHNIQUE: Contiguous axial images were obtained from the base of the skull through the vertex without intravenous contrast. RADIATION DOSE REDUCTION: This exam was performed according to the departmental dose-optimization program which includes automated exposure control, adjustment of the mA and/or kV according to patient size and/or use of iterative reconstruction technique. COMPARISON:  Head CT 09/02/2021 and earlier.  Brain MRI 09/09/2020. FINDINGS: Brain: Chronic cerebral volume loss for age appears stable. No midline shift, ventriculomegaly, mass effect, evidence of mass lesion, intracranial hemorrhage or evidence of cortically based acute infarction. Gray-white matter differentiation remains within normal limits. Vascular: Faint Calcified atherosclerosis at the skull base. No suspicious intracranial vascular hyperdensity. Skull: Chronic appearing nasal bone fractures appear stable from last year. No acute osseous abnormality identified. Sinuses/Orbits: Scattered mild paranasal sinus mucosal thickening has not significantly changed. Tympanic cavities and mastoids remain clear. Other: Visualized orbits and scalp soft tissues are within normal limits. IMPRESSION: 1. No acute intracranial abnormality. Chronic advanced for age cerebral volume loss. 2. Chronic nasal bone fractures. Electronically Signed   By: Genevie Ann M.D.   On: 09/29/2021 05:23      Subjective: Significant events overnight, patient denies any complaints  Discharge Exam: Vitals:   10/21/21 0852 10/21/21 1128  BP: 113/61 128/89  Pulse: 98 100  Resp: 20 20  Temp: 98.2 F (36.8 C) 98 F (36.7 C)  SpO2: 98% 100%   Vitals:   10/20/21 2000 10/21/21 0500 10/21/21 0852 10/21/21 1128  BP: 135/81  113/61 128/89  Pulse: 99  98 100  Resp: 17  20 20   Temp: 98.3 F (36.8 C)  98.2 F (36.8 C) 98 F (36.7 C)  TempSrc: Oral  Oral Oral  SpO2: 96%  98% 100%  Weight:  55.3 kg    Height:         General: Pt is alert, awake, not in acute distress Cardiovascular: RRR, S1/S2 +, no rubs, no gallops Respiratory: CTA bilaterally, no wheezing, no rhonchi Abdominal: Soft, NT, ND, bowel sounds + Extremities: no edema, no cyanosis    The results of significant diagnostics from this hospitalization (including imaging, microbiology, ancillary and laboratory) are listed below for reference.     Microbiology: No results found for this or any previous visit (from the past 240 hour(s)).   Labs: BNP (last 3 results) Recent Labs    09/29/21 0258  BNP 38.1   Basic Metabolic Panel: Recent Labs  Lab 10/15/21 0403 10/16/21 0434 10/17/21 0648 10/18/21 0144  NA 134*  --   --  136  K 3.9  --   --  3.5  CL 104  --   --  103  CO2 24  --   --  23  GLUCOSE 96  --   --  124*  BUN 7  --   --  9  CREATININE 0.55*  --   --  0.50*  CALCIUM 8.8*  --   --  8.9  MG 1.6* 1.8 1.6* 2.0  PHOS 3.7  --   --   --    Liver Function Tests: No results for input(s): "AST", "ALT", "ALKPHOS", "BILITOT", "PROT", "ALBUMIN" in the last 168 hours. No results for input(s): "LIPASE", "AMYLASE" in the last 168 hours. No  results for input(s): "AMMONIA" in the last 168 hours. CBC: No results for input(s): "WBC", "NEUTROABS", "HGB", "HCT", "MCV", "PLT" in the last 168 hours. Cardiac Enzymes: No results for input(s): "CKTOTAL", "CKMB", "CKMBINDEX", "TROPONINI" in the last 168 hours. BNP: Invalid input(s): "POCBNP" CBG: No results for input(s): "GLUCAP" in the last 168 hours. D-Dimer No results for input(s): "DDIMER" in the last 72 hours. Hgb A1c No results for input(s): "HGBA1C" in the last 72 hours. Lipid Profile No results for input(s): "CHOL", "HDL", "LDLCALC", "TRIG", "CHOLHDL", "LDLDIRECT" in the last 72 hours. Thyroid function studies No results for input(s): "TSH", "T4TOTAL", "T3FREE", "THYROIDAB" in the last 72 hours.  Invalid input(s): "FREET3" Anemia work up No results for input(s):  "VITAMINB12", "FOLATE", "FERRITIN", "TIBC", "IRON", "RETICCTPCT" in the last 72 hours. Urinalysis    Component Value Date/Time   COLORURINE YELLOW 09/29/2021 0258   APPEARANCEUR CLEAR 09/29/2021 0258   LABSPEC 1.004 (L) 09/29/2021 0258   PHURINE 7.0 09/29/2021 0258   GLUCOSEU NEGATIVE 09/29/2021 0258   HGBUR NEGATIVE 09/29/2021 0258   BILIRUBINUR NEGATIVE 09/29/2021 0258   KETONESUR NEGATIVE 09/29/2021 0258   PROTEINUR NEGATIVE 09/29/2021 0258   NITRITE NEGATIVE 09/29/2021 0258   LEUKOCYTESUR NEGATIVE 09/29/2021 0258   Sepsis Labs No results for input(s): "WBC" in the last 168 hours.  Invalid input(s): "PROCALCITONIN", "LACTICIDVEN" Microbiology No results found for this or any previous visit (from the past 240 hour(s)).   Time coordinating discharge: Over 30 minutes  SIGNED:   Huey Bienenstockawood Kandace Elrod, MD  [facility] 10/21/2021, 4:41 PM Pager   If 7PM-7AM, please contact night-coverage [email or web address]

## 2021-11-03 NOTE — Progress Notes (Signed)
Metabolic encephalopathy ruled in

## 2021-11-19 ENCOUNTER — Encounter: Payer: Self-pay | Admitting: Internal Medicine

## 2021-11-19 ENCOUNTER — Ambulatory Visit: Payer: Self-pay | Attending: Internal Medicine | Admitting: Internal Medicine

## 2021-11-19 ENCOUNTER — Other Ambulatory Visit: Payer: Self-pay

## 2021-11-19 VITALS — BP 132/90 | HR 106 | Ht 63.0 in | Wt 131.8 lb

## 2021-11-19 DIAGNOSIS — R7989 Other specified abnormal findings of blood chemistry: Secondary | ICD-10-CM

## 2021-11-19 DIAGNOSIS — G621 Alcoholic polyneuropathy: Secondary | ICD-10-CM

## 2021-11-19 DIAGNOSIS — Z23 Encounter for immunization: Secondary | ICD-10-CM

## 2021-11-19 DIAGNOSIS — F1020 Alcohol dependence, uncomplicated: Secondary | ICD-10-CM

## 2021-11-19 DIAGNOSIS — D649 Anemia, unspecified: Secondary | ICD-10-CM | POA: Insufficient documentation

## 2021-11-19 DIAGNOSIS — G312 Degeneration of nervous system due to alcohol: Secondary | ICD-10-CM

## 2021-11-19 DIAGNOSIS — Z1211 Encounter for screening for malignant neoplasm of colon: Secondary | ICD-10-CM

## 2021-11-19 DIAGNOSIS — Z09 Encounter for follow-up examination after completed treatment for conditions other than malignant neoplasm: Secondary | ICD-10-CM

## 2021-11-19 MED ORDER — FOLIC ACID 1 MG PO TABS
1.0000 mg | ORAL_TABLET | Freq: Every day | ORAL | 1 refills | Status: AC
  Filled 2021-11-19: qty 100, 100d supply, fill #0

## 2021-11-19 MED ORDER — THIAMINE HCL 100 MG PO TABS
100.0000 mg | ORAL_TABLET | Freq: Every day | ORAL | 1 refills | Status: AC
  Filled 2021-11-19: qty 100, 100d supply, fill #0

## 2021-11-19 NOTE — Progress Notes (Signed)
Patient ID: Samuel [name], male    DOB: 01/28/1970  MRN: [id number]  CC: Hospitalization Follow-up   Subjective: Samuel [name] Standard is a 51 y.o. male who presents for hosp f/u to est care His concerns today include:   Patient presents for hospital follow-up.  Patient with history of EtOH abuse.  Hospitalized 9/13 - 10/21/2021 with confusion, tingling/numbness involving all extremities and frequent falls.  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  Given 5 days of high-dose IV thiamine then placed on oral thiamine.  Advised to stop EtOH use.  Found to have microcytic anemia with no evidence of blood loss.  Also found to have elevated TSH with high free T4.  Assessed to have sick euthyroid.  Studies done in the hospital are outlined below: Significant studies: 9/13>> CT head: No acute intracranial abnormalities 9/13>> CT C-spine: No fracture/dislocation 9/13>> CT T-spine: Sequelae of T7 compression and T10 Chance fractures-interval healing.  No acute osseous abnormality seen. 9/13>> CT L-spine: No acute osseous abnormality seen. 9/13>> CT chest: No acute traumatic injury.  Chronic right clavicle/scapula/bilateral rib fractures. 9/13>> CT abdomen/pelvis: No traumatic injury 9/13>> vitamin B12: 584 9/13>> vitamin B1: 33.9 (low) 9/14>> MRI brain: No acute intracranial abnormality. 9/14>> SPEP/light chain: No M spike 9/14>> HBsAg/anti-HCV Ab: Negative 9/14>> ANA positive, dsDNA slightly elevated at 13. 9/14>> A1c 5.4 9/14>> TSH: 11.067. 9/16>> FT4: 0.81 (normal) 9/21>> MRI C-spine: No acute abnormality.   Today: Still having numbness in his extremities.  Larey Seat once since hospital discharge.  Given walker at the time of hospital discharge.  He has it at home and states that he uses it sometimes.  He feels weak in his  extremities.  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  He has cut back on drinking to "1 large beer a day."  Trying to cut back more on staying away from friends who he used to hang out with.  He did not bring medications with him but states that he is just about finished the medicines that he was prescribed on hospital discharge that included thiamine and folic acid.  Patient Active Problem List   Diagnosis Date Noted   Generalized weakness 09/29/2021   Wernicke's encephalopathy 09/29/2021   Encephalopathy 09/29/2021   Acute encephalopathy 09/09/2020   Electrolyte imbalance 09/09/2020   Fall 07/02/2020   Pancreatitis 06/17/2018     Current Outpatient Medications on File Prior to Visit  Medication Sig Dispense Refill   magnesium oxide (MAG-OX) 400 MG tablet Take 2 tablets (800 mg total) by mouth 2 (two) times daily. 60 tablet 0   Multiple Vitamin (MULTIVITAMIN WITH MINERALS) TABS tablet Take 1 tablet by mouth daily. 30 tablet 0   No current facility-administered medications on file prior to visit.    No Known Allergies  Social History   Socioeconomic History   Marital status: Single    Spouse name: Not on file   Number of children: Not on file   Years of education: Not on file   Highest education level: Not on file  Occupational History   Occupation: Dishwasher   Tobacco Use   Smoking status: Never   Smokeless tobacco: Never  Substance and Sexual Activity   Alcohol use: Yes    Alcohol/week: 72.0 standard drinks of alcohol    Types: 72 Cans  of beer per week    Comment: Pt drinks 1 pack/ day   Drug use: No   Sexual activity: Not Currently  Other Topics Concern   Not on file  Social History Narrative   ** Merged History Encounter **       Social Determinants of Health   Financial Resource Strain: High Risk (06/17/2018)   Overall Financial Resource Strain (CARDIA)    Difficulty of Paying Living Expenses: Very  hard  Food Insecurity: Food Insecurity Present (10/06/2021)   Hunger Vital Sign    Worried About Running Out of Food in the Last Year: Sometimes true    Ran Out of Food in the Last Year: Sometimes true  Transportation Needs: Unmet Transportation Needs (10/06/2021)   PRAPARE - Hydrologist (Medical): Yes    Lack of Transportation (Non-Medical): Yes  Physical Activity: Unknown (06/17/2018)   Exercise Vital Sign    Days of Exercise per Week: Patient refused    Minutes of Exercise per Session: Patient refused  Stress: Not on file  Social Connections: Unknown (06/17/2018)   Social Connection and Isolation Panel [NHANES]    Frequency of Communication with Friends and Family: Patient refused    Frequency of Social Gatherings with Friends and Family: Patient refused    Attends Religious Services: Patient refused    Active Member of Clubs or Organizations: Patient refused    Attends Archivist Meetings: Patient refused    Marital Status: Patient refused  Intimate Partner Violence: Not At Risk (10/06/2021)   Humiliation, Afraid, Rape, and Kick questionnaire    Fear of Current or Ex-Partner: No    Emotionally Abused: No    Physically Abused: No    Sexually Abused: No    No family history on file.  No past surgical history on file.  ROS: Review of Systems Negative except as stated above  PHYSICAL EXAM: BP (!) 132/90 (BP Location: Right Arm, Patient Position: Sitting, Cuff Size: Normal)   Pulse (!) 106   Ht 5\' 3"  (1.6 m)   Wt 131 lb 12.8 oz (59.8 kg)   SpO2 96%   BMI 23.35 kg/m   Physical Exam   General appearance -Hispanic male who looks much older than stated age. Mental status -patient very talkative.  He answers most questions appropriately. Chest - clear to auscultation, no wheezes, rales or rhonchi, symmetric air entry Heart - normal rate, regular rhythm, normal S1, S2, no murmurs, rubs, clicks or gallops Neurological -grip 4/5  bilaterally.  Power in the upper extremities proximally and distally 4+/5.  Power in the lower extremities 4+/5 proximally and distally.  Low foot to floor clearance with a little unsteady gait. Extremities - peripheral pulses normal, no pedal edema, no clubbing or cyanosis     Latest Ref Rng & Units 10/18/2021    1:44 AM 10/15/2021    4:03 AM 10/13/2021    7:00 AM  CMP  Glucose 70 - 99 mg/dL 124  96  94   BUN 6 - 20 mg/dL 9  7  9    Creatinine 0.61 - 1.24 mg/dL 0.50  0.55  0.46   Sodium 135 - 145 mmol/L 136  134  136   Potassium 3.5 - 5.1 mmol/L 3.5  3.9  4.2   Chloride 98 - 111 mmol/L 103  104  105   CO2 22 - 32 mmol/L 23  24  24    Calcium 8.9 - 10.3 mg/dL 8.9  8.8  9.2  Lipid Panel  No results found for: "CHOL", "TRIG", "HDL", "CHOLHDL", "VLDL", "LDLCALC", "LDLDIRECT"  CBC    Component Value Date/Time   WBC 14.8 (H) 10/13/2021 0700   RBC 3.57 (L) 10/13/2021 0700   HGB 12.1 (L) 10/13/2021 0700   HCT 34.3 (L) 10/13/2021 0700   PLT 326 10/13/2021 0700   MCV 96.1 10/13/2021 0700   MCH 33.9 10/13/2021 0700   MCHC 35.3 10/13/2021 0700   RDW 14.7 10/13/2021 0700   LYMPHSABS 1.4 09/29/2021 0258   MONOABS 1.2 (H) 09/29/2021 0258   EOSABS 0.2 09/29/2021 0258   BASOSABS 0.1 09/29/2021 0258    ASSESSMENT AND PLAN:  1. Hospital discharge follow-up   2. Alcoholic peripheral neuropathy (HCC) 3. 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  Advised that continuous use of alcohol will continue to worsen his symptoms and may also lead to cirrhosis of the liver. Encouraged him to get into a treatment group.  Discussed trying him with naltrexone to help decrease craving for alcohol.  He is willing to try the medication.  I told him I would like to see his liver function tests first to see if his liver enzymes have come down.  He denies being on any street drugs or any prescription  opiates. - Hepatic Function Panel - folic acid (FOLVITE) 1 MG tablet; Take 1 tablet (1 mg total) by mouth daily.  Dispense: 100 tablet; Refill: 1 - thiamine (VITAMIN B1) 100 MG tablet; Take 1 tablet (100 mg total) by mouth daily.  Dispense: 100 tablet; Refill: 1  4. Abnormal TSH Recheck thyroid function - TSH+T4F+T3Free  5. Normocytic anemia - CBC - Iron, TIBC and Ferritin Panel  6. Screening for colon cancer Patient agreeable to colon cancer screening by doing FIT tests. - Fecal occult blood, imunochemical([facility])  7. Need for immunization against influenza - Flu Vaccine QUAD 58mo+IM (Fluarix, Fluzone & Alfiuria Quad PF)    Patient was given the opportunity to ask questions.  Patient verbalized understanding of the plan and was able to repeat key elements of the plan.   This documentation was completed using Paediatric nurse.  Any transcriptional errors are unintentional.  Orders Placed This Encounter  Procedures   Fecal occult blood, imunochemical([facility])   Flu Vaccine QUAD 63mo+IM (Fluarix, Fluzone & Alfiuria Quad PF)   CBC   Iron, TIBC and Ferritin Panel   TSH+T4F+T3Free   Hepatic Function Panel     Requested Prescriptions   Signed Prescriptions Disp Refills   folic acid (FOLVITE) 1 MG tablet 100 tablet 1    Sig: Take 1 tablet (1 mg total) by mouth daily.   thiamine (VITAMIN B1) 100 MG tablet 100 tablet 1    Sig: Take 1 tablet (100 mg total) by mouth daily.    Return in about 3 months (around 02/19/2022).  Jonah Blue, MD, FACP

## 2021-11-20 ENCOUNTER — Other Ambulatory Visit: Payer: Self-pay | Admitting: Internal Medicine

## 2021-11-20 ENCOUNTER — Other Ambulatory Visit: Payer: Self-pay

## 2021-11-20 LAB — TSH+T4F+T3FREE
Free T4: 1.37 ng/dL (ref 0.82–1.77)
T3, Free: 4.2 pg/mL (ref 2.0–4.4)
TSH: 3.34 u[IU]/mL (ref 0.450–4.500)

## 2021-11-20 LAB — CBC
Hematocrit: 34.6 % — ABNORMAL LOW (ref 37.5–51.0)
Hemoglobin: 12.1 g/dL — ABNORMAL LOW (ref 13.0–17.7)
MCH: 32.4 pg (ref 26.6–33.0)
MCHC: 35 g/dL (ref 31.5–35.7)
MCV: 93 fL (ref 79–97)
Platelets: 176 10*3/uL (ref 150–450)
RBC: 3.74 x10E6/uL — ABNORMAL LOW (ref 4.14–5.80)
RDW: 12.5 % (ref 11.6–15.4)
WBC: 7.9 10*3/uL (ref 3.4–10.8)

## 2021-11-20 LAB — HEPATIC FUNCTION PANEL
ALT: 19 IU/L (ref 0–44)
AST: 39 IU/L (ref 0–40)
Albumin: 4.1 g/dL (ref 4.1–5.1)
Alkaline Phosphatase: 159 IU/L — ABNORMAL HIGH (ref 44–121)
Bilirubin Total: 0.8 mg/dL (ref 0.0–1.2)
Bilirubin, Direct: 0.27 mg/dL (ref 0.00–0.40)
Total Protein: 7.6 g/dL (ref 6.0–8.5)

## 2021-11-20 LAB — IRON,TIBC AND FERRITIN PANEL
Ferritin: 76 ng/mL (ref 30–400)
Iron Saturation: 29 % (ref 15–55)
Iron: 117 ug/dL (ref 38–169)
Total Iron Binding Capacity: 410 ug/dL (ref 250–450)
UIBC: 293 ug/dL (ref 111–343)

## 2021-11-20 MED ORDER — NALTREXONE HCL 50 MG PO TABS
50.0000 mg | ORAL_TABLET | Freq: Every day | ORAL | 0 refills | Status: AC
  Filled 2021-11-20: qty 30, 30d supply, fill #0

## 2021-11-20 NOTE — Progress Notes (Signed)
Let patient know that his blood count is stable.  Liver function tests improved.  Iron level is okay.  Thyroid level has normalized.  I have sent the prescription to our pharmacy for the medication naltrexone to help decrease his cravings for alcohol.  He should pick it up.

## 2021-11-22 ENCOUNTER — Other Ambulatory Visit: Payer: Self-pay

## 2021-11-26 ENCOUNTER — Other Ambulatory Visit: Payer: Self-pay

## 2021-12-08 ENCOUNTER — Telehealth: Payer: Self-pay

## 2021-12-08 NOTE — Telephone Encounter (Signed)
-----   Message from Guy Franco, RN sent at 12/08/2021 10:18 AM EST -----  ----- Message ----- From: Marcine Matar, MD Sent: 11/20/2021   8:25 AM EST To: Ronette Deter, CMA  Let patient know that his blood count is stable.  Liver function tests improved.  Iron level is okay.  Thyroid level has normalized.  I have sent the prescription to our pharmacy for the medication naltrexone to help decrease his cravings for alcohol.  He should pick it up.

## 2021-12-08 NOTE — Telephone Encounter (Signed)
Patient does not have number on file letter will be sent out

## 2021-12-22 ENCOUNTER — Telehealth: Payer: Self-pay | Admitting: Internal Medicine

## 2021-12-22 NOTE — Telephone Encounter (Signed)
Reach out to patient and let him know that he has refills on his medications he needs to contact his pharmacy.

## 2021-12-22 NOTE — Telephone Encounter (Signed)
Using Spanish interpreter Renea Ee # [phone], pt. Given lab results. Also requests refill on all medications.

## 2021-12-29 NOTE — Telephone Encounter (Signed)
Called patient but number on file is the wrong number. Letter sent via mail.

## 2022-02-13 ENCOUNTER — Emergency Department (HOSPITAL_COMMUNITY): Payer: Self-pay

## 2022-02-13 ENCOUNTER — Encounter (HOSPITAL_COMMUNITY): Payer: Self-pay

## 2022-02-13 ENCOUNTER — Other Ambulatory Visit: Payer: Self-pay

## 2022-02-13 ENCOUNTER — Emergency Department (HOSPITAL_COMMUNITY)
Admission: EM | Admit: 2022-02-13 | Discharge: 2022-02-13 | Disposition: A | Discharge status: Home / Self Care | Payer: Self-pay | Attending: Emergency Medicine | Admitting: Emergency Medicine

## 2022-02-13 DIAGNOSIS — Y92524 Gas station as the place of occurrence of the external cause: Secondary | ICD-10-CM | POA: Insufficient documentation

## 2022-02-13 DIAGNOSIS — W19XXXA Unspecified fall, initial encounter: Secondary | ICD-10-CM | POA: Insufficient documentation

## 2022-02-13 DIAGNOSIS — F10220 Alcohol dependence with intoxication, uncomplicated: Secondary | ICD-10-CM | POA: Insufficient documentation

## 2022-02-13 DIAGNOSIS — F10920 Alcohol use, unspecified with intoxication, uncomplicated: Secondary | ICD-10-CM

## 2022-02-13 DIAGNOSIS — S0031XA Abrasion of nose, initial encounter: Secondary | ICD-10-CM | POA: Insufficient documentation

## 2022-02-13 DIAGNOSIS — S0990XA Unspecified injury of head, initial encounter: Secondary | ICD-10-CM | POA: Insufficient documentation

## 2022-02-13 DIAGNOSIS — S0081XA Abrasion of other part of head, initial encounter: Secondary | ICD-10-CM | POA: Insufficient documentation

## 2022-02-13 LAB — COMPREHENSIVE METABOLIC PANEL
ALT: 24 U/L (ref 0–44)
AST: 58 U/L — ABNORMAL HIGH (ref 15–41)
Albumin: 3.8 g/dL (ref 3.5–5.0)
Alkaline Phosphatase: 104 U/L (ref 38–126)
Anion gap: 11 (ref 5–15)
BUN: 10 mg/dL (ref 6–20)
CO2: 25 mmol/L (ref 22–32)
Calcium: 8.7 mg/dL — ABNORMAL LOW (ref 8.9–10.3)
Chloride: 107 mmol/L (ref 98–111)
Creatinine, Ser: 0.57 mg/dL — ABNORMAL LOW (ref 0.61–1.24)
GFR, Estimated: >60 mL/min (ref >60)
Glucose, Bld: 98 mg/dL (ref 70–99)
Potassium: 3.9 mmol/L (ref 3.5–5.1)
Sodium: 143 mmol/L (ref 135–145)
Total Bilirubin: 0.5 mg/dL (ref 0.3–1.2)
Total Protein: 7.2 g/dL (ref 6.5–8.1)

## 2022-02-13 LAB — CBC WITH DIFFERENTIAL/PLATELET
Abs Immature Granulocytes: 0.01 10*3/uL (ref 0.00–0.07)
Basophils Absolute: 0.1 10*3/uL (ref 0.0–0.1)
Basophils Relative: 1 %
Eosinophils Absolute: 0.1 10*3/uL (ref 0.0–0.5)
Eosinophils Relative: 3 %
HCT: 35.5 % — ABNORMAL LOW (ref 39.0–52.0)
Hemoglobin: 11.8 g/dL — ABNORMAL LOW (ref 13.0–17.0)
Immature Granulocytes: 0 %
Lymphocytes Relative: 19 %
Lymphs Abs: 0.8 10*3/uL (ref 0.7–4.0)
MCH: 29.2 pg (ref 26.0–34.0)
MCHC: 33.2 g/dL (ref 30.0–36.0)
MCV: 87.9 fL (ref 80.0–100.0)
Monocytes Absolute: 0.3 10*3/uL (ref 0.1–1.0)
Monocytes Relative: 8 %
Neutro Abs: 2.9 10*3/uL (ref 1.7–7.7)
Neutrophils Relative %: 69 %
Platelets: 146 10*3/uL — ABNORMAL LOW (ref 150–400)
RBC: 4.04 MIL/uL — ABNORMAL LOW (ref 4.22–5.81)
RDW: 17 % — ABNORMAL HIGH (ref 11.5–15.5)
WBC: 4.1 10*3/uL (ref 4.0–10.5)
nRBC: 0 % (ref 0.0–0.2)

## 2022-02-13 MED ORDER — LORAZEPAM 2 MG/ML IJ SOLN
2.0000 mg | Freq: Once | INTRAMUSCULAR | Status: AC
  Administered 2022-02-13: 2 mg via INTRAVENOUS
  Filled 2022-02-13: qty 1

## 2022-02-13 MED ORDER — LACTATED RINGERS IV BOLUS
1000.0000 mL | Freq: Once | INTRAVENOUS | Status: AC
  Administered 2022-02-13: 1000 mL via INTRAVENOUS

## 2022-02-13 MED ORDER — LORAZEPAM 1 MG PO TABS
1.0000 mg | ORAL_TABLET | Freq: Once | ORAL | Status: AC
  Administered 2022-02-13: 1 mg via ORAL
  Filled 2022-02-13: qty 1

## 2022-02-13 MED ORDER — LORAZEPAM 1 MG PO TABS: 2.0000 mg | ORAL_TABLET | Freq: Once | ORAL | Status: DC

## 2022-02-13 NOTE — ED Triage Notes (Signed)
Pt arrives EMS from outside gas station with reports of fall. Pt has abrasion to forehead and nose. Pt reports he has been drinking tonight and he fell. Pt awake and talking but with slurred speech. Pt arrives in c-collar with EMS due to intoxication. Pt alert and oriented to self and situation.

## 2022-02-13 NOTE — ED Provider Notes (Signed)
[place] EMERGENCY DEPARTMENT AT [facility] Provider Note  CSN: [id number] Arrival date & time: 02/13/22 0053  Chief Complaint(s) Head Injury, Alcohol Intoxication, and Fall  ED Triage Notes Tonna Corner, RN (Registered Nurse)  Emergency Medicine  Date of Service: 02/13/2022  1:04 AM  Signed Pt arrives EMS from outside gas station with reports of fall. Pt has abrasion to forehead and nose. Pt reports he has been drinking tonight and he fell. Pt awake and talking but with slurred speech. Pt arrives in c-collar with EMS due to intoxication. Pt alert and oriented to self and situation.      HPI Samuel [name] is a 52 y.o. male with a past medical history listed below including alcohol use disorder who presents to the emergency department after a fall resulting in head trauma in the setting of alcohol intoxication.  Patient admits to drinking 4 40 ounce beers during the day.  He is unsure if he lost consciousness.  Endorses forehead pain related to abrasions.  Denies any headache or neck pain.  No chest pain or abdominal pain.  No lower extremity pain.  The history is provided by the patient and the EMS personnel.    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 Authorizing Provider  folic acid (FOLVITE) 1 MG tablet Take 1 tablet (1 mg total) by mouth daily. 11/19/21   Marcine Matar, MD   magnesium oxide (MAG-OX) 400 MG tablet Take 2 tablets (800 mg total) by mouth 2 (two) times daily. 10/21/21   Elgergawy, Leana Roe, MD  Multiple Vitamin (MULTIVITAMIN WITH MINERALS) TABS tablet Take 1 tablet by mouth daily. 10/22/21   Elgergawy, Leana Roe, MD  naltrexone (DEPADE) 50 MG tablet Take 1 tablet (50 mg total) by mouth daily. 11/20/21   Marcine Matar, MD  thiamine (VITAMIN B1) 100 MG tablet Take 1 tablet (100 mg total) by mouth daily. 11/19/21   Marcine Matar, MD                                                                                                                                    Allergies Patient has no known allergies.  Review of Systems Review of Systems As noted in HPI  Physical Exam Vital Signs  I have reviewed the triage vital signs BP (!) 88/62 (BP Location: Right Arm)  Pulse 85   Temp 98.1 F (36.7 C) (Oral)   Resp 18   SpO2 97%   Physical Exam Constitutional:      General: He is not in acute distress.    Appearance: He is well-developed. He is not diaphoretic.     Interventions: Cervical collar in place.  HENT:     Head: Normocephalic. Abrasion present. No laceration.      Right Ear: External ear normal.     Left Ear: External ear normal.  Eyes:     General: No scleral icterus.       Right eye: No discharge.        Left eye: No discharge.     Conjunctiva/sclera: Conjunctivae normal.     Pupils: Pupils are equal, round, and reactive to light.  Cardiovascular:     Rate and Rhythm: Regular rhythm.     Pulses:          Radial pulses are 2+ on the right side and 2+ on the left side.       Dorsalis pedis pulses are 2+ on the right side and 2+ on the left side.     Heart sounds: Normal heart sounds. No murmur heard.    No friction rub. No gallop.  Pulmonary:     Effort: Pulmonary effort is normal. No respiratory distress.     Breath sounds: Normal breath sounds. No stridor.  Abdominal:     General: There is no distension.      Palpations: Abdomen is soft.     Tenderness: There is no abdominal tenderness.  Musculoskeletal:     Cervical back: Normal range of motion and neck supple. No bony tenderness.     Thoracic back: No bony tenderness.     Lumbar back: No bony tenderness.     Comments: Clavicle stable. Chest stable to AP/Lat compression. Pelvis stable to Lat compression. No obvious extremity deformity. No chest or abdominal wall contusion.  Skin:    General: Skin is warm.  Neurological:     Mental Status: He is alert and oriented to person, place, and time.     GCS: GCS eye subscore is 4. GCS verbal subscore is 5. GCS motor subscore is 6.     Comments: Moving all extremities      ED Results and Treatments Labs (all labs ordered are listed, but only abnormal results are displayed) Labs Reviewed - No data to display                                                                                                                       EKG  EKG Interpretation  Date/Time:    Ventricular Rate:    PR Interval:    QRS Duration:   QT Interval:    QTC Calculation:   R Axis:     Text Interpretation:         Radiology CT Head Wo Contrast  Result Date: 02/13/2022 CLINICAL DATA:  Fall, forehead/nose abrasion, ETOH,  slurred speech EXAM: CT HEAD WITHOUT CONTRAST CT MAXILLOFACIAL WITHOUT CONTRAST CT CERVICAL SPINE WITHOUT CONTRAST TECHNIQUE: Multidetector CT imaging of the head, cervical spine, and maxillofacial structures were performed using the [name] protocol without intravenous contrast. Multiplanar CT image reconstructions of the cervical spine and maxillofacial structures were also generated. RADIATION DOSE REDUCTION: This exam was performed according to the departmental dose-optimization program which includes automated exposure control, adjustment of the mA and/or kV according to patient size and/or use of iterative reconstruction technique. COMPARISON:  MRI cervical spine dated 10/07/2021. MRI brain  dated 09/30/2021. FINDINGS: CT HEAD FINDINGS Brain: No evidence of acute infarction, hemorrhage, hydrocephalus, extra-axial collection or mass lesion/mass effect. Mild subcortical white matter and periventricular small vessel ischemic changes. Age advanced cortical atrophy, cerebellar predominant. Vascular: No hyperdense vessel or unexpected calcification. Skull: Normal. Negative for fracture or focal lesion. Other: None. CT MAXILLOFACIAL FINDINGS Osseous: No evidence of maxillofacial fracture. Nasal bones are intact. Mandible is intact. Bilateral mandibular condyles are well-seated in the TMJs. Orbits: Bilateral orbits, including the globes and retroconal soft tissues, are within normal limits. Sinuses: The visualized paranasal sinuses are essentially clear. The mastoid air cells are unopacified. Soft tissues: Negative. CT CERVICAL SPINE FINDINGS Alignment: Normal cervical lordosis. Skull base and vertebrae: No acute fracture. No primary bone lesion or focal pathologic process. Soft tissues and spinal canal: No prevertebral fluid or swelling. No visible canal hematoma. Disc levels: Very mild degenerative changes of the mid cervical spine. Spinal canal is patent. Upper chest: Visualized lung apices are clear. Other: Visualized thyroid is unremarkable. IMPRESSION: No evidence of acute intracranial abnormality. Age advanced cortical atrophy, cerebellar predominant. Mild small vessel ischemic changes. Normal maxillofacial CT. Normal cervical spine CT. Electronically Signed   By: Julian Hy M.D.   On: 02/13/2022 02:04   CT Cervical Spine Wo Contrast  Result Date: 02/13/2022 CLINICAL DATA:  Fall, forehead/nose abrasion, ETOH, slurred speech EXAM: CT HEAD WITHOUT CONTRAST CT MAXILLOFACIAL WITHOUT CONTRAST CT CERVICAL SPINE WITHOUT CONTRAST TECHNIQUE: Multidetector CT imaging of the head, cervical spine, and maxillofacial structures were performed using the [name] protocol without intravenous contrast.  Multiplanar CT image reconstructions of the cervical spine and maxillofacial structures were also generated. RADIATION DOSE REDUCTION: This exam was performed according to the departmental dose-optimization program which includes automated exposure control, adjustment of the mA and/or kV according to patient size and/or use of iterative reconstruction technique. COMPARISON:  MRI cervical spine dated 10/07/2021. MRI brain dated 09/30/2021. FINDINGS: CT HEAD FINDINGS Brain: No evidence of acute infarction, hemorrhage, hydrocephalus, extra-axial collection or mass lesion/mass effect. Mild subcortical white matter and periventricular small vessel ischemic changes. Age advanced cortical atrophy, cerebellar predominant. Vascular: No hyperdense vessel or unexpected calcification. Skull: Normal. Negative for fracture or focal lesion. Other: None. CT MAXILLOFACIAL FINDINGS Osseous: No evidence of maxillofacial fracture. Nasal bones are intact. Mandible is intact. Bilateral mandibular condyles are well-seated in the TMJs. Orbits: Bilateral orbits, including the globes and retroconal soft tissues, are within normal limits. Sinuses: The visualized paranasal sinuses are essentially clear. The mastoid air cells are unopacified. Soft tissues: Negative. CT CERVICAL SPINE FINDINGS Alignment: Normal cervical lordosis. Skull base and vertebrae: No acute fracture. No primary bone lesion or focal pathologic process. Soft tissues and spinal canal: No prevertebral fluid or swelling. No visible canal hematoma. Disc levels: Very mild degenerative changes of the mid cervical spine. Spinal canal is patent. Upper chest: Visualized lung apices are clear. Other: Visualized thyroid is unremarkable. IMPRESSION: No evidence of acute intracranial abnormality. Age advanced  cortical atrophy, cerebellar predominant. Mild small vessel ischemic changes. Normal maxillofacial CT. Normal cervical spine CT. Electronically Signed   By: Julian Hy M.D.    On: 02/13/2022 02:04   CT Maxillofacial Wo Contrast  Result Date: 02/13/2022 CLINICAL DATA:  Fall, forehead/nose abrasion, ETOH, slurred speech EXAM: CT HEAD WITHOUT CONTRAST CT MAXILLOFACIAL WITHOUT CONTRAST CT CERVICAL SPINE WITHOUT CONTRAST TECHNIQUE: Multidetector CT imaging of the head, cervical spine, and maxillofacial structures were performed using the [name] protocol without intravenous contrast. Multiplanar CT image reconstructions of the cervical spine and maxillofacial structures were also generated. RADIATION DOSE REDUCTION: This exam was performed according to the departmental dose-optimization program which includes automated exposure control, adjustment of the mA and/or kV according to patient size and/or use of iterative reconstruction technique. COMPARISON:  MRI cervical spine dated 10/07/2021. MRI brain dated 09/30/2021. FINDINGS: CT HEAD FINDINGS Brain: No evidence of acute infarction, hemorrhage, hydrocephalus, extra-axial collection or mass lesion/mass effect. Mild subcortical white matter and periventricular small vessel ischemic changes. Age advanced cortical atrophy, cerebellar predominant. Vascular: No hyperdense vessel or unexpected calcification. Skull: Normal. Negative for fracture or focal lesion. Other: None. CT MAXILLOFACIAL FINDINGS Osseous: No evidence of maxillofacial fracture. Nasal bones are intact. Mandible is intact. Bilateral mandibular condyles are well-seated in the TMJs. Orbits: Bilateral orbits, including the globes and retroconal soft tissues, are within normal limits. Sinuses: The visualized paranasal sinuses are essentially clear. The mastoid air cells are unopacified. Soft tissues: Negative. CT CERVICAL SPINE FINDINGS Alignment: Normal cervical lordosis. Skull base and vertebrae: No acute fracture. No primary bone lesion or focal pathologic process. Soft tissues and spinal canal: No prevertebral fluid or swelling. No visible canal hematoma. Disc levels: Very  mild degenerative changes of the mid cervical spine. Spinal canal is patent. Upper chest: Visualized lung apices are clear. Other: Visualized thyroid is unremarkable. IMPRESSION: No evidence of acute intracranial abnormality. Age advanced cortical atrophy, cerebellar predominant. Mild small vessel ischemic changes. Normal maxillofacial CT. Normal cervical spine CT. Electronically Signed   By: Julian Hy M.D.   On: 02/13/2022 02:04    Medications Ordered in ED Medications - No data to display                                                                                                                                   Procedures Procedures  (including critical care time)  Medical Decision Making / ED Course   Medical Decision Making Amount and/or Complexity of Data Reviewed Radiology: ordered and independent interpretation performed. Decision-making details documented in ED Course.    Fall with head trauma in the setting of EtOH, Will rule out serious internal injuries including ICH or cervical fractures.  CT head negative for ICH or mass effect, CT face negative for fractures, CT cervical spine negative for fracture or dislocation.  Patient allowed to metabolize. Clinically sober Ambulated without complication.      Final Clinical Impression(s) / ED Diagnoses  Final diagnoses:  Injury of head, initial encounter  Alcoholic intoxication without complication ([place])   The patient appears reasonably screened and/or stabilized for discharge and I doubt any other medical condition or other [facility] requiring further screening, evaluation, or treatment in the ED at this time. I have discussed the findings, Dx and Tx plan with the patient/family who expressed understanding and agree(s) with the plan. Discharge instructions discussed at length. The patient/family was given strict return precautions who verbalized understanding of the instructions. No further questions at time of  discharge.  Disposition: Discharge  Condition: Good  ED Discharge Orders     None         Follow Up: Primary care provider  Call  to schedule an appointment for close follow up    {Document critical care time when appropriate:1}  {Document review of labs and clinical decision tools ie heart score, Chads2Vasc2 etc:1}  {Document your independent review of radiology images, and any outside records:1} {Document your discussion with family members, caretakers, and with consultants:1} {Document social determinants of health affecting pt's care:1} {Document your decision making why or why not admission, treatments were needed:1} This chart was dictated using voice recognition software.  Despite best efforts to proofread,  errors can occur which can change the documentation meaning.

## 2022-02-13 NOTE — ED Provider Notes (Signed)
  Physical Exam  BP (!) 137/90   Pulse (!) 129   Temp 98.1 F (36.7 C) (Oral)   Resp 18   SpO2 95%   Physical Exam  Procedures  Procedures  ED Course / MDM    Medical Decision Making Risk Prescription drug management.   ***

## 2022-02-13 NOTE — ED Notes (Signed)
Patient transported to CT 

## 2022-02-18 ENCOUNTER — Ambulatory Visit: Payer: Self-pay | Attending: Internal Medicine | Admitting: Internal Medicine

## 2022-05-11 ENCOUNTER — Emergency Department (HOSPITAL_COMMUNITY)
Admission: EM | Admit: 2022-05-11 | Discharge: 2022-05-12 | Disposition: A | Discharge status: Home / Self Care | Payer: Self-pay | Attending: Student | Admitting: Student

## 2022-05-11 ENCOUNTER — Encounter (HOSPITAL_COMMUNITY): Payer: Self-pay | Admitting: Emergency Medicine

## 2022-05-11 ENCOUNTER — Other Ambulatory Visit: Payer: Self-pay

## 2022-05-11 DIAGNOSIS — R209 Unspecified disturbances of skin sensation: Secondary | ICD-10-CM | POA: Insufficient documentation

## 2022-05-11 DIAGNOSIS — S8265XA Nondisplaced fracture of lateral malleolus of left fibula, initial encounter for closed fracture: Secondary | ICD-10-CM | POA: Insufficient documentation

## 2022-05-11 DIAGNOSIS — M79672 Pain in left foot: Secondary | ICD-10-CM | POA: Insufficient documentation

## 2022-05-11 DIAGNOSIS — W19XXXA Unspecified fall, initial encounter: Secondary | ICD-10-CM | POA: Insufficient documentation

## 2022-05-11 NOTE — ED Notes (Signed)
First set of blood cultures and rainbow sent down to lab. Drawn at 2311.

## 2022-05-11 NOTE — ED Notes (Signed)
Patient fell a week ago.

## 2022-05-11 NOTE — ED Triage Notes (Signed)
BIB EMS, pt is homeless, for left foot pain/swelling. Pt states he fell a month ago and fell in glass and injured both of his feet. Left lower leg is hot to touch, red, and swollen. Pedal pulses palpable. Pt drinks daily and has had 2 "bottles of beer" today.

## 2022-05-12 ENCOUNTER — Emergency Department (HOSPITAL_COMMUNITY): Payer: Self-pay

## 2022-05-12 ENCOUNTER — Encounter (HOSPITAL_COMMUNITY): Payer: Self-pay | Admitting: Radiology

## 2022-05-12 LAB — CBC WITH DIFFERENTIAL/PLATELET
Abs Immature Granulocytes: 0 10*3/uL (ref 0.00–0.07)
Basophils Absolute: 0 10*3/uL (ref 0.0–0.1)
Basophils Relative: 1 %
Eosinophils Absolute: 0.1 10*3/uL (ref 0.0–0.5)
Eosinophils Relative: 4 %
HCT: 36.3 % — ABNORMAL LOW (ref 39.0–52.0)
Hemoglobin: 11.9 g/dL — ABNORMAL LOW (ref 13.0–17.0)
Immature Granulocytes: 0 %
Lymphocytes Relative: 24 %
Lymphs Abs: 0.9 10*3/uL (ref 0.7–4.0)
MCH: 29.6 pg (ref 26.0–34.0)
MCHC: 32.8 g/dL (ref 30.0–36.0)
MCV: 90.3 fL (ref 80.0–100.0)
Monocytes Absolute: 0.3 10*3/uL (ref 0.1–1.0)
Monocytes Relative: 8 %
Neutro Abs: 2.4 10*3/uL (ref 1.7–7.7)
Neutrophils Relative %: 63 %
Platelets: 91 10*3/uL — ABNORMAL LOW (ref 150–400)
RBC: 4.02 MIL/uL — ABNORMAL LOW (ref 4.22–5.81)
RDW: 15.3 % (ref 11.5–15.5)
WBC: 3.8 10*3/uL — ABNORMAL LOW (ref 4.0–10.5)
nRBC: 0 % (ref 0.0–0.2)

## 2022-05-12 LAB — COMPREHENSIVE METABOLIC PANEL
ALT: 30 U/L (ref 0–44)
AST: 58 U/L — ABNORMAL HIGH (ref 15–41)
Albumin: 4.4 g/dL (ref 3.5–5.0)
Alkaline Phosphatase: 152 U/L — ABNORMAL HIGH (ref 38–126)
Anion gap: 11 (ref 5–15)
BUN: 6 mg/dL (ref 6–20)
CO2: 23 mmol/L (ref 22–32)
Calcium: 9 mg/dL (ref 8.9–10.3)
Chloride: 101 mmol/L (ref 98–111)
Creatinine, Ser: 0.52 mg/dL — ABNORMAL LOW (ref 0.61–1.24)
GFR, Estimated: >60 mL/min (ref >60)
Glucose, Bld: 115 mg/dL — ABNORMAL HIGH (ref 70–99)
Potassium: 3.2 mmol/L — ABNORMAL LOW (ref 3.5–5.1)
Sodium: 135 mmol/L (ref 135–145)
Total Bilirubin: 1 mg/dL (ref 0.3–1.2)
Total Protein: 8.4 g/dL — ABNORMAL HIGH (ref 6.5–8.1)

## 2022-05-12 LAB — SEDIMENTATION RATE: Sed Rate: 25 mm/hr — ABNORMAL HIGH (ref 0–16)

## 2022-05-12 LAB — VITAMIN B12: Vitamin B-12: 183 pg/mL (ref 180–914)

## 2022-05-12 LAB — C-REACTIVE PROTEIN: CRP: 1.2 mg/dL — ABNORMAL HIGH (ref <1.0)

## 2022-05-12 NOTE — ED Provider Notes (Signed)
[place] EMERGENCY DEPARTMENT AT [facility] Provider Note  CSN: [id number] Arrival date & time: 05/11/22 2209  Chief Complaint(s) Foot Pain  HPI [name] [name] is a 52 y.o. male with PMH alcohol abuse, Warnicke's encephalopathy who presents emergency room for evaluation of hand and foot numbness with left foot pain and swelling.  Patient states that 2 weeks ago [place] the [place] and has had persistent numbness of his hands and feet since.  He states that he has had worsening swelling and pain to the left ankle in particular.  Denies chest pain, shortness of breath, abdominal pain, nausea, vomiting or other systemic symptoms.  Patient appears intoxicated here in the emergency department.  Does endorse alcohol use tonight.   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 Authorizing Provider  folic acid (FOLVITE) 1 MG tablet Take 1 tablet (1 mg total) by mouth daily. 11/19/21   Marcine Matar, MD  magnesium oxide (MAG-OX) 400 MG tablet Take 2 tablets (800 mg total) by mouth 2 (two) times daily. 10/21/21   Elgergawy, Leana Roe, MD  Multiple Vitamin (MULTIVITAMIN WITH MINERALS) TABS tablet Take 1 tablet by mouth daily. 10/22/21   Elgergawy, Leana Roe, MD  naltrexone (DEPADE) 50 MG tablet Take 1 tablet (50 mg total) by mouth daily. 11/20/21   Marcine Matar, MD  thiamine (VITAMIN B1) 100 MG tablet Take 1 tablet  (100 mg total) by mouth daily. 11/19/21   Marcine Matar, MD                                                                                                                                    Past Surgical History History reviewed. No pertinent surgical history. Family History History reviewed. No pertinent family history.  Social History Social History   Tobacco Use   Smoking status: Never   Smokeless tobacco: Never  Substance Use Topics   Alcohol use: Yes    Alcohol/week: 72.0 standard drinks of alcohol    Types: 72 Cans of beer per week    Comment: Pt drinks 1 pack/ day   Drug use: No   Allergies Patient has no known allergies.  Review of Systems Review of Systems  Musculoskeletal:  Positive for joint swelling.  Neurological:  Positive for numbness.    Physical Exam Vital Signs  I have reviewed the triage vital  signs BP 124/89   Pulse 79   Temp 98.3 F (36.8 C) (Oral)   Resp 13   SpO2 97%   Physical Exam Constitutional:      General: He is not in acute distress.    Appearance: Normal appearance.  HENT:     Head: Normocephalic and atraumatic.     Nose: No congestion or rhinorrhea.  Eyes:     General:        Right eye: No discharge.        Left eye: No discharge.     Extraocular Movements: Extraocular movements intact.     Pupils: Pupils are equal, round, and reactive to light.  Cardiovascular:     Rate and Rhythm: Normal rate and regular rhythm.     Heart sounds: No murmur heard. Pulmonary:     Effort: No respiratory distress.     Breath sounds: No wheezing or rales.  Abdominal:     General: There is no distension.     Tenderness: There is no abdominal tenderness.  Musculoskeletal:        General: Swelling and tenderness present. Normal range of motion.     Cervical back: Normal range of motion.  Skin:    General: Skin is warm and dry.  Neurological:     General: No focal deficit present.     Mental Status: He is alert.     Sensory: Sensory  deficit (mild numbness to b/l feet) present.    ED Results and Treatments Labs (all labs ordered are listed, but only abnormal results are displayed) Labs Reviewed  COMPREHENSIVE METABOLIC PANEL - Abnormal; Notable for the following components:      Result Value   Potassium 3.2 (*)    Glucose, Bld 115 (*)    Creatinine, Ser 0.52 (*)    Total Protein 8.4 (*)    AST 58 (*)    Alkaline Phosphatase 152 (*)    All other components within normal limits  CBC WITH DIFFERENTIAL/PLATELET - Abnormal; Notable for the following components:   WBC 3.8 (*)    RBC 4.02 (*)    Hemoglobin 11.9 (*)    HCT 36.3 (*)    Platelets 91 (*)    All other components within normal limits  SEDIMENTATION RATE - Abnormal; Notable for the following components:   Sed Rate 25 (*)    All other components within normal limits  VITAMIN B12  C-REACTIVE PROTEIN                                                                                                                          Radiology DG Tibia/Fibula Left  Result Date: 05/12/2022 CLINICAL DATA:  Status post fall 1 month ago. EXAM: LEFT TIBIA AND FIBULA - 2 VIEW COMPARISON:  None Available. FINDINGS: An acute, mildly displaced fracture deformity is seen involving the left lateral malleolus. A chronic appearing cortical deformity is also seen involving the proximal left fibula. There is no evidence  of dislocation. Moderate severity soft tissue swelling is seen along the lateral aspect of the left ankle. IMPRESSION: Acute, mildly displaced fracture of the left lateral malleolus. Electronically Signed   By: Aram Candela M.D.   On: 05/12/2022 01:12   DG Foot Complete Left  Result Date: 05/12/2022 CLINICAL DATA:  Status post fall 1 month ago. EXAM: LEFT FOOT - COMPLETE 3+ VIEW COMPARISON:  None Available. FINDINGS: An acute, mildly displaced fracture deformity is seen involving the left lateral malleolus. There is no evidence of dislocation. Moderate to marked  severity lateral soft tissue swelling is noted. IMPRESSION: Acute, mildly displaced fracture of the left lateral malleolus. Electronically Signed   By: Aram Candela M.D.   On: 05/12/2022 01:11    Pertinent labs & imaging results that were available during my care of the patient were reviewed by me and considered in my medical decision making (see MDM for details).  Medications Ordered in ED Medications - No data to display                                                                                                                                   Procedures Procedures  (including critical care time)  Medical Decision Making / ED Course   This patient presents to the ED for concern of leg pain and swelling, this involves an extensive number of treatment options, and is a complaint that carries with it a high risk of complications and morbidity.  The differential diagnosis includes fracture, cellulitis, DVT, ligamentous injury, hematoma, contusion  MDM: Patient seen emergency room for evaluation of numbness of the hands and feet with left lower extremity swelling and tenderness to the ankle.  Physical exam with left greater than right swelling around the ankle and tenderness at the lateral malleolus.  X-ray imaging showing a fracture of the lateral malleolus only.  No soft tissue gas.  Laboratory evaluation with mild leukopenia to 3.8, hemoglobin 11.9, thrombocytopenia to 91.  Patient placed in a walking boot and given crutches for ambulation with outpatient orthopedic follow-up for his isolated lateral malleolus fracture.  CT head and C-spine obtained given patient's heavy alcohol use and risk for subdural hematoma and history of fall.  These were reassuringly negative and thus patient discharged with outpatient orthopedic follow-up.   Additional history obtained:  -External records from outside source obtained and reviewed including: Chart review including previous notes, labs,  imaging, consultation notes   Lab Tests: -I ordered, reviewed, and interpreted labs.   The pertinent results include:   Labs Reviewed  COMPREHENSIVE METABOLIC PANEL - Abnormal; Notable for the following components:      Result Value   Potassium 3.2 (*)    Glucose, Bld 115 (*)    Creatinine, Ser 0.52 (*)    Total Protein 8.4 (*)    AST 58 (*)    Alkaline Phosphatase 152 (*)    All other components within normal  limits  CBC WITH DIFFERENTIAL/PLATELET - Abnormal; Notable for the following components:   WBC 3.8 (*)    RBC 4.02 (*)    Hemoglobin 11.9 (*)    HCT 36.3 (*)    Platelets 91 (*)    All other components within normal limits  SEDIMENTATION RATE - Abnormal; Notable for the following components:   Sed Rate 25 (*)    All other components within normal limits  VITAMIN B12  C-REACTIVE PROTEIN    Imaging Studies ordered: I ordered imaging studies including CT head, C-spine, x-ray tib-fib and foot I independently visualized and interpreted imaging. I agree with the radiologist interpretation   Medicines ordered and prescription drug management: No orders of the defined types were placed in this encounter.   -I have reviewed the patients home medicines and have made adjustments as needed  Critical interventions none   Cardiac Monitoring: The patient was maintained on a cardiac monitor.  I personally viewed and interpreted the cardiac monitored which showed an underlying rhythm of: NSR  Social Determinants of Health:  Factors impacting patients care include: Daily alcohol use, Spanish-speaking, interpreter used   Reevaluation: After the interventions noted above, I reevaluated the patient and found that they have :improved  Co morbidities that complicate the patient evaluation  Past Medical History:  Diagnosis Date   Alcohol use    ETOH abuse       Dispostion: I considered admission for this patient, but at this time, patient does not meet inpatient  criteria for admission he is safe for discharge with outpatient follow-up     Final Clinical Impression(s) / ED Diagnoses Final diagnoses:  None     @PCDICTATION @    Glendora Score, MD 05/13/22 [phone]

## 2022-08-09 ENCOUNTER — Encounter (HOSPITAL_COMMUNITY): Payer: Self-pay

## 2022-08-09 ENCOUNTER — Emergency Department (HOSPITAL_COMMUNITY)
Admission: EM | Admit: 2022-08-09 | Discharge: 2022-08-09 | Disposition: A | Discharge status: Home / Self Care | Payer: Self-pay | Attending: Emergency Medicine | Admitting: Emergency Medicine

## 2022-08-09 ENCOUNTER — Other Ambulatory Visit: Payer: Self-pay

## 2022-08-09 DIAGNOSIS — K068 Other specified disorders of gingiva and edentulous alveolar ridge: Secondary | ICD-10-CM

## 2022-08-09 DIAGNOSIS — F1010 Alcohol abuse, uncomplicated: Secondary | ICD-10-CM

## 2022-08-09 DIAGNOSIS — F10920 Alcohol use, unspecified with intoxication, uncomplicated: Secondary | ICD-10-CM | POA: Insufficient documentation

## 2022-08-09 DIAGNOSIS — R1110 Vomiting, unspecified: Secondary | ICD-10-CM | POA: Insufficient documentation

## 2022-08-09 DIAGNOSIS — R251 Tremor, unspecified: Secondary | ICD-10-CM | POA: Insufficient documentation

## 2022-08-09 DIAGNOSIS — R7989 Other specified abnormal findings of blood chemistry: Secondary | ICD-10-CM

## 2022-08-09 LAB — COMPREHENSIVE METABOLIC PANEL
ALT: 51 U/L — ABNORMAL HIGH (ref 0–44)
AST: 163 U/L — ABNORMAL HIGH (ref 15–41)
Albumin: 4.2 g/dL (ref 3.5–5.0)
Alkaline Phosphatase: 182 U/L — ABNORMAL HIGH (ref 38–126)
Anion gap: 13 (ref 5–15)
BUN: 9 mg/dL (ref 6–20)
CO2: 24 mmol/L (ref 22–32)
Calcium: 8.6 mg/dL — ABNORMAL LOW (ref 8.9–10.3)
Chloride: 98 mmol/L (ref 98–111)
Creatinine, Ser: 0.71 mg/dL (ref 0.61–1.24)
GFR, Estimated: >60 mL/min (ref >60)
Glucose, Bld: 115 mg/dL — ABNORMAL HIGH (ref 70–99)
Potassium: 3.4 mmol/L — ABNORMAL LOW (ref 3.5–5.1)
Sodium: 135 mmol/L (ref 135–145)
Total Bilirubin: 2 mg/dL — ABNORMAL HIGH (ref 0.3–1.2)
Total Protein: 8 g/dL (ref 6.5–8.1)

## 2022-08-09 LAB — CBC
HCT: 36.7 % — ABNORMAL LOW (ref 39.0–52.0)
Hemoglobin: 12.5 g/dL — ABNORMAL LOW (ref 13.0–17.0)
MCH: 31.6 pg (ref 26.0–34.0)
MCHC: 34.1 g/dL (ref 30.0–36.0)
MCV: 92.9 fL (ref 80.0–100.0)
Platelets: 70 10*3/uL — ABNORMAL LOW (ref 150–400)
RBC: 3.95 MIL/uL — ABNORMAL LOW (ref 4.22–5.81)
RDW: 14.5 % (ref 11.5–15.5)
WBC: 9.1 10*3/uL (ref 4.0–10.5)
nRBC: 0 % (ref 0.0–0.2)

## 2022-08-09 LAB — TYPE AND SCREEN
ABO/RH(D): O POS
Antibody Screen: NEGATIVE

## 2022-08-09 LAB — PROTIME-INR
INR: 1.2 (ref 0.8–1.2)
Prothrombin Time: 15.1 seconds (ref 11.4–15.2)

## 2022-08-09 LAB — ETHANOL: Alcohol, Ethyl (B): <10 mg/dL (ref <10)

## 2022-08-09 MED ORDER — CHLORDIAZEPOXIDE HCL 25 MG PO CAPS: ORAL_CAPSULE | ORAL | 0 refills | Status: AC

## 2022-08-09 MED ORDER — LACTATED RINGERS IV BOLUS
1000.0000 mL | Freq: Once | INTRAVENOUS | Status: AC
  Administered 2022-08-09: 1000 mL via INTRAVENOUS

## 2022-08-09 MED ORDER — LORAZEPAM 1 MG PO TABS
1.0000 mg | ORAL_TABLET | Freq: Once | ORAL | Status: AC
  Administered 2022-08-09: 1 mg via ORAL
  Filled 2022-08-09: qty 1

## 2022-08-09 MED ORDER — THIAMINE HCL 100 MG/ML IJ SOLN
100.0000 mg | Freq: Once | INTRAMUSCULAR | Status: AC
  Administered 2022-08-09: 100 mg via INTRAVENOUS
  Filled 2022-08-09: qty 2

## 2022-08-09 NOTE — ED Provider Notes (Signed)
[place] EMERGENCY DEPARTMENT AT [facility] Provider Note   CSN: [id number] Arrival date & time: 08/09/22  1004     History  Chief Complaint  Patient presents with   Emesis    [name] is a 52 y.o. male.   Emesis    Patient has a history of pancreatitis, encephalopathy, chronic alcohol use.  Patient states he went to a store today and bought several 40 ounce beers.  He was outside drinking them.  Patient states he fell asleep.  Bystanders reportedly noted that the patient had vomited.  There was question of blood.  Patient denies any complaints.  He states he has a place to stay but he does not have a key.  The woman who owns the place is not there he has to wait till she comes home.  Patient denies having any trouble with nausea or vomiting.  He denies any abdominal pain.  He states he feels fine.  Home Medications Prior to Admission medications   Medication Sig Start Date End Date Taking? Authorizing Provider  folic acid (FOLVITE) 1 MG tablet Take 1 tablet (1 mg total) by mouth daily. 11/19/21   Marcine Matar, MD  magnesium oxide (MAG-OX) 400 MG tablet Take 2 tablets (800 mg total) by mouth 2 (two) times daily. 10/21/21   Elgergawy, Leana Roe, MD  Multiple Vitamin (MULTIVITAMIN WITH MINERALS) TABS tablet Take 1 tablet by mouth daily. 10/22/21   Elgergawy, Leana Roe, MD  naltrexone (DEPADE) 50 MG tablet Take 1 tablet (50 mg total) by mouth daily. 11/20/21   Marcine Matar, MD  thiamine (VITAMIN B1) 100 MG tablet Take 1 tablet (100 mg total) by mouth daily. 11/19/21   Marcine Matar, MD      Allergies    Patient has no known allergies.    Review of Systems   Review of Systems  Gastrointestinal:  Positive for vomiting.    Physical Exam Updated Vital Signs BP (!) 144/98 (BP Location: Right Arm)   Pulse (!) 107   Temp 98.8 F (37.1 C)   Resp 18   SpO2 94%  Physical Exam Vitals and nursing note reviewed.  Constitutional:       Appearance: He is well-developed. He is not diaphoretic.  HENT:     Head: Normocephalic and atraumatic.     Right Ear: External ear normal.     Left Ear: External ear normal.  Eyes:     General: No scleral icterus.       Right eye: No discharge.        Left eye: No discharge.     Conjunctiva/sclera: Conjunctivae normal.  Neck:     Trachea: No tracheal deviation.  Cardiovascular:     Rate and Rhythm: Normal rate and regular rhythm.  Pulmonary:     Effort: Pulmonary effort is normal. No respiratory distress.     Breath sounds: Normal breath sounds. No stridor. No wheezing or rales.  Abdominal:     General: Bowel sounds are normal. There is no distension.     Palpations: Abdomen is soft.     Tenderness: There is no abdominal tenderness. There is no guarding or rebound.  Musculoskeletal:        General: No tenderness or deformity.     Cervical back: Neck supple.  Skin:    General: Skin is warm and dry.     Findings: No rash.  Neurological:     General: No focal deficit  present.     Mental Status: He is alert.     Cranial Nerves: No cranial nerve deficit, dysarthria or facial asymmetry.     Sensory: No sensory deficit.     Motor: No abnormal muscle tone or seizure activity.     Coordination: Coordination normal.     Comments: Mild tremor noted  Psychiatric:        Mood and Affect: Mood normal.     ED Results / Procedures / Treatments   Labs (all labs ordered are listed, but only abnormal results are displayed) Labs Reviewed - No data to display  EKG None  Radiology No results found.  Procedures Procedures    Medications Ordered in ED Medications  lactated ringers bolus 1,000 mL (has no administration in time range)  thiamine (VITAMIN B1) injection 100 mg (has no administration in time range)  LORazepam (ATIVAN) tablet 1 mg (has no administration in time range)    ED Course/ Medical Decision Making/ A&P                             Medical Decision  Making Differential diagnosis includes but not limited to alcohol withdrawal, alcoholic gastritis, GI bleeding, dehydration, electrolyte abnormality  Amount and/or Complexity of Data Reviewed Labs: ordered.  Risk Prescription drug management.   Patient presented to the ED for evaluation of alcohol intoxication and possible hematemesis.  Patient denied any complaints.   Admits to several 40 oz beers today. Pt mildly tremulous.    Pt treated with iv fluids, dose of ativan.  Labs ordered.  Pending at time of shift change.  Care turned over to Dr Silverio Lay        Final Clinical Impression(s) / ED Diagnoses Final diagnoses:  None    Rx / DC Orders ED Discharge Orders     None         Linwood Dibbles, MD 08/09/22 [phone]

## 2022-08-09 NOTE — Discharge Instructions (Signed)
You have bleeding from your gum  I have prescribed Librium to help you with withdrawal symptoms  Your liver function is elevated from your chronic alcohol use.  I recommend that you stop using alcohol.  See your doctor for follow-up. I recommend that you have repeat liver function test with your doctor   Return to ER if you have seizure, head injury, uncontrolled bleeding
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
## 2022-08-09 NOTE — ED Notes (Signed)
Called lab about lab results. Stated they did not have any tubes or any in the saved rack for the patient. Notified MD and stated I would re-draw the blood. MD verbalized understanding.

## 2022-08-09 NOTE — ED Notes (Signed)
Pt discharge papers reviewed with pt, pt provided with a bus pass and address to destination. Safe transport to escort pt to bus stop

## 2022-11-21 ENCOUNTER — Emergency Department (HOSPITAL_COMMUNITY): Payer: Self-pay

## 2022-11-21 ENCOUNTER — Encounter (HOSPITAL_COMMUNITY): Payer: Self-pay

## 2022-11-21 ENCOUNTER — Inpatient Hospital Stay (HOSPITAL_COMMUNITY)
Admission: EM | Admit: 2022-11-21 | Discharge: 2022-11-29 | DRG: 154 | Disposition: A | Discharge status: Home / Self Care | Payer: Self-pay | Attending: Surgery | Admitting: Surgery

## 2022-11-21 DIAGNOSIS — S0101XA Laceration without foreign body of scalp, initial encounter: Secondary | ICD-10-CM | POA: Diagnosis present

## 2022-11-21 DIAGNOSIS — Z603 Acculturation difficulty: Secondary | ICD-10-CM | POA: Diagnosis present

## 2022-11-21 DIAGNOSIS — F1010 Alcohol abuse, uncomplicated: Secondary | ICD-10-CM | POA: Diagnosis present

## 2022-11-21 DIAGNOSIS — S0532XA Ocular laceration without prolapse or loss of intraocular tissue, left eye, initial encounter: Secondary | ICD-10-CM | POA: Diagnosis present

## 2022-11-21 DIAGNOSIS — Z23 Encounter for immunization: Secondary | ICD-10-CM

## 2022-11-21 DIAGNOSIS — R4587 Impulsiveness: Secondary | ICD-10-CM | POA: Diagnosis not present

## 2022-11-21 DIAGNOSIS — S022XXA Fracture of nasal bones, initial encounter for closed fracture: Principal | ICD-10-CM | POA: Diagnosis present

## 2022-11-21 DIAGNOSIS — I959 Hypotension, unspecified: Secondary | ICD-10-CM | POA: Diagnosis not present

## 2022-11-21 DIAGNOSIS — S0181XA Laceration without foreign body of other part of head, initial encounter: Secondary | ICD-10-CM | POA: Diagnosis present

## 2022-11-21 DIAGNOSIS — E538 Deficiency of other specified B group vitamins: Secondary | ICD-10-CM | POA: Diagnosis present

## 2022-11-21 DIAGNOSIS — S062XAA Diffuse traumatic brain injury with loss of consciousness status unknown, initial encounter: Secondary | ICD-10-CM | POA: Diagnosis present

## 2022-11-21 DIAGNOSIS — Y908 Blood alcohol level of 240 mg/100 ml or more: Secondary | ICD-10-CM | POA: Diagnosis present

## 2022-11-21 DIAGNOSIS — S0512XA Contusion of eyeball and orbital tissues, left eye, initial encounter: Secondary | ICD-10-CM

## 2022-11-21 DIAGNOSIS — R000 Tachycardia, unspecified: Secondary | ICD-10-CM | POA: Diagnosis present

## 2022-11-21 DIAGNOSIS — S0990XA Unspecified injury of head, initial encounter: Principal | ICD-10-CM

## 2022-11-21 DIAGNOSIS — S065XAA Traumatic subdural hemorrhage with loss of consciousness status unknown, initial encounter: Secondary | ICD-10-CM | POA: Diagnosis present

## 2022-11-21 DIAGNOSIS — S01119A Laceration without foreign body of unspecified eyelid and periocular area, initial encounter: Secondary | ICD-10-CM | POA: Diagnosis present

## 2022-11-21 DIAGNOSIS — Z781 Physical restraint status: Secondary | ICD-10-CM

## 2022-11-21 DIAGNOSIS — S0285XA Fracture of orbit, unspecified, initial encounter for closed fracture: Secondary | ICD-10-CM | POA: Diagnosis present

## 2022-11-21 DIAGNOSIS — R609 Edema, unspecified: Secondary | ICD-10-CM | POA: Diagnosis present

## 2022-11-21 DIAGNOSIS — E663 Overweight: Secondary | ICD-10-CM | POA: Diagnosis present

## 2022-11-21 DIAGNOSIS — S066XAA Traumatic subarachnoid hemorrhage with loss of consciousness status unknown, initial encounter: Secondary | ICD-10-CM | POA: Diagnosis present

## 2022-11-21 DIAGNOSIS — I4891 Unspecified atrial fibrillation: Secondary | ICD-10-CM | POA: Diagnosis present

## 2022-11-21 LAB — CBC
HCT: 38.6 % — ABNORMAL LOW (ref 39.0–52.0)
HCT: 29.6 % — ABNORMAL LOW (ref 39.0–52.0)
Hemoglobin: 12.7 g/dL — ABNORMAL LOW (ref 13.0–17.0)
Hemoglobin: 9.4 g/dL — ABNORMAL LOW (ref 13.0–17.0)
MCH: 32 pg (ref 26.0–34.0)
MCH: 31.3 pg (ref 26.0–34.0)
MCHC: 32.9 g/dL (ref 30.0–36.0)
MCHC: 31.8 g/dL (ref 30.0–36.0)
MCV: 97.2 fL (ref 80.0–100.0)
MCV: 98.7 fL (ref 80.0–100.0)
Platelets: 142 10*3/uL — ABNORMAL LOW (ref 150–400)
Platelets: 115 10*3/uL — ABNORMAL LOW (ref 150–400)
RBC: 3.97 MIL/uL — ABNORMAL LOW (ref 4.22–5.81)
RBC: 3 MIL/uL — ABNORMAL LOW (ref 4.22–5.81)
RDW: 13.2 % (ref 11.5–15.5)
RDW: 13.4 % (ref 11.5–15.5)
WBC: 5.4 10*3/uL (ref 4.0–10.5)
WBC: 11.2 10*3/uL — ABNORMAL HIGH (ref 4.0–10.5)
nRBC: 0 % (ref 0.0–0.2)
nRBC: 0 % (ref 0.0–0.2)

## 2022-11-21 LAB — COMPREHENSIVE METABOLIC PANEL
ALT: 47 U/L — ABNORMAL HIGH (ref 0–44)
AST: 111 U/L — ABNORMAL HIGH (ref 15–41)
Albumin: 4.4 g/dL (ref 3.5–5.0)
Alkaline Phosphatase: 141 U/L — ABNORMAL HIGH (ref 38–126)
Anion gap: 14 (ref 5–15)
BUN: 6 mg/dL (ref 6–20)
CO2: 20 mmol/L — ABNORMAL LOW (ref 22–32)
Calcium: 8.5 mg/dL — ABNORMAL LOW (ref 8.9–10.3)
Chloride: 104 mmol/L (ref 98–111)
Creatinine, Ser: 0.57 mg/dL — ABNORMAL LOW (ref 0.61–1.24)
GFR, Estimated: >60 mL/min (ref >60)
Glucose, Bld: 137 mg/dL — ABNORMAL HIGH (ref 70–99)
Potassium: 3.7 mmol/L (ref 3.5–5.1)
Sodium: 138 mmol/L (ref 135–145)
Total Bilirubin: 0.5 mg/dL (ref <1.2)
Total Protein: 8.3 g/dL — ABNORMAL HIGH (ref 6.5–8.1)

## 2022-11-21 LAB — I-STAT CHEM 8, ED
BUN: 6 mg/dL — ABNORMAL LOW (ref 8–23)
Calcium, Ion: 1.02 mmol/L — ABNORMAL LOW (ref 1.15–1.40)
Chloride: 106 mmol/L (ref 98–111)
Creatinine, Ser: 1 mg/dL (ref 0.61–1.24)
Glucose, Bld: 135 mg/dL — ABNORMAL HIGH (ref 70–99)
HCT: 42 % (ref 39.0–52.0)
Hemoglobin: 14.3 g/dL (ref 13.0–17.0)
Potassium: 3.8 mmol/L (ref 3.5–5.1)
Sodium: 141 mmol/L (ref 135–145)
TCO2: 22 mmol/L (ref 22–32)

## 2022-11-21 LAB — BASIC METABOLIC PANEL
Anion gap: 13 (ref 5–15)
BUN: 6 mg/dL (ref 6–20)
CO2: 15 mmol/L — ABNORMAL LOW (ref 22–32)
Calcium: 7.4 mg/dL — ABNORMAL LOW (ref 8.9–10.3)
Chloride: 114 mmol/L — ABNORMAL HIGH (ref 98–111)
Creatinine, Ser: 0.55 mg/dL — ABNORMAL LOW (ref 0.61–1.24)
GFR, Estimated: >60 mL/min (ref >60)
Glucose, Bld: 134 mg/dL — ABNORMAL HIGH (ref 70–99)
Potassium: 3.6 mmol/L (ref 3.5–5.1)
Sodium: 142 mmol/L (ref 135–145)

## 2022-11-21 LAB — I-STAT CG4 LACTIC ACID, ED: Lactic Acid, Venous: 2 mmol/L (ref 0.5–1.9)

## 2022-11-21 LAB — POCT I-STAT, CHEM 8
BUN: 6 mg/dL (ref 6–20)
Calcium, Ion: 1.02 mmol/L — ABNORMAL LOW (ref 1.15–1.40)
Chloride: 106 mmol/L (ref 98–111)
Creatinine, Ser: 1 mg/dL (ref 0.61–1.24)
Glucose, Bld: 135 mg/dL — ABNORMAL HIGH (ref 70–99)
HCT: 42 % (ref 39.0–52.0)
Hemoglobin: 14.3 g/dL (ref 13.0–17.0)
Potassium: 3.8 mmol/L (ref 3.5–5.1)
Sodium: 141 mmol/L (ref 135–145)
TCO2: 22 mmol/L (ref 22–32)

## 2022-11-21 LAB — TRAUMA TEG PANEL
CFF Max Amplitude: 19.6 mm (ref 15–32)
Citrated Kaolin (R): 9 min (ref 4.6–9.1)
Citrated Rapid TEG (MA): 60.7 mm (ref 52–70)
Lysis at 30 Minutes: 0 % (ref 0.0–2.6)

## 2022-11-21 LAB — LACTIC ACID, PLASMA: Lactic Acid, Venous: 3 mmol/L (ref 0.5–1.9)

## 2022-11-21 LAB — ETHANOL: Alcohol, Ethyl (B): 396 mg/dL (ref <10)

## 2022-11-21 LAB — SAMPLE TO BLOOD BANK

## 2022-11-21 LAB — PROTIME-INR
INR: 1.1 (ref 0.8–1.2)
Prothrombin Time: 14.8 s (ref 11.4–15.2)

## 2022-11-21 MED ORDER — IOHEXOL 350 MG/ML SOLN
75.0000 mL | Freq: Once | INTRAVENOUS | Status: AC | PRN
  Administered 2022-11-21: 75 mL via INTRAVENOUS

## 2022-11-21 MED ORDER — TETANUS-DIPHTH-ACELL PERTUSSIS 5-2.5-18.5 LF-MCG/0.5 IM SUSY
0.5000 mL | PREFILLED_SYRINGE | Freq: Once | INTRAMUSCULAR | Status: AC
  Administered 2022-11-21: 0.5 mL via INTRAMUSCULAR

## 2022-11-21 MED ORDER — NOREPINEPHRINE 4 MG/250ML-% IV SOLN
INTRAVENOUS | Status: AC
  Filled 2022-11-21: qty 250

## 2022-11-21 MED ORDER — HYDRALAZINE HCL 20 MG/ML IJ SOLN
10.0000 mg | INTRAMUSCULAR | Status: DC | PRN
Start: 2022-11-21 — End: 2022-11-29

## 2022-11-21 MED ORDER — ONDANSETRON 4 MG PO TBDP: 4.0000 mg | ORAL_TABLET | Freq: Four times a day (QID) | ORAL | Status: DC | PRN

## 2022-11-21 MED ORDER — CHLORHEXIDINE GLUCONATE CLOTH 2 % EX PADS
6.0000 | MEDICATED_PAD | Freq: Every day | CUTANEOUS | Status: DC
  Administered 2022-11-21 – 2022-11-25 (×5): 6 via TOPICAL

## 2022-11-21 MED ORDER — THIAMINE HCL 100 MG/ML IJ SOLN: 100.0000 mg | Freq: Every day | INTRAMUSCULAR | Status: DC

## 2022-11-21 MED ORDER — ADULT MULTIVITAMIN W/MINERALS CH
1.0000 | ORAL_TABLET | Freq: Every day | ORAL | Status: DC
  Administered 2022-11-22 – 2022-11-29 (×8): 1 via ORAL
  Filled 2022-11-21 (×8): qty 1

## 2022-11-21 MED ORDER — LORAZEPAM 1 MG PO TABS
1.0000 mg | ORAL_TABLET | ORAL | Status: AC | PRN
  Administered 2022-11-21: 1 mg via ORAL
  Administered 2022-11-21 – 2022-11-22 (×3): 2 mg via ORAL
  Administered 2022-11-23: 1 mg via ORAL
  Administered 2022-11-24: 4 mg via ORAL
  Filled 2022-11-21: qty 2
  Filled 2022-11-21: qty 1
  Filled 2022-11-21: qty 4

## 2022-11-21 MED ORDER — ONDANSETRON HCL 4 MG/2ML IJ SOLN
4.0000 mg | Freq: Four times a day (QID) | INTRAMUSCULAR | Status: DC | PRN
  Administered 2022-11-21: 4 mg via INTRAVENOUS
  Filled 2022-11-21: qty 2

## 2022-11-21 MED ORDER — THIAMINE MONONITRATE 100 MG PO TABS
100.0000 mg | ORAL_TABLET | Freq: Every day | ORAL | Status: DC
  Administered 2022-11-22 – 2022-11-29 (×8): 100 mg via ORAL
  Filled 2022-11-21 (×8): qty 1

## 2022-11-21 MED ORDER — CALCIUM GLUCONATE-NACL 2-0.675 GM/100ML-% IV SOLN
2.0000 g | Freq: Once | INTRAVENOUS | Status: AC
  Administered 2022-11-21: 2000 mg via INTRAVENOUS
  Filled 2022-11-21 (×2): qty 100

## 2022-11-21 MED ORDER — LEVETIRACETAM IN NACL 1000 MG/100ML IV SOLN
1000.0000 mg | Freq: Two times a day (BID) | INTRAVENOUS | Status: DC
  Administered 2022-11-21 – 2022-11-22 (×3): 1000 mg via INTRAVENOUS
  Filled 2022-11-21 (×3): qty 100

## 2022-11-21 MED ORDER — MORPHINE SULFATE (PF) 4 MG/ML IV SOLN
4.0000 mg | INTRAVENOUS | Status: DC | PRN
  Administered 2022-11-21 (×2): 4 mg via INTRAVENOUS
  Filled 2022-11-21 (×2): qty 1

## 2022-11-21 MED ORDER — METHOCARBAMOL 500 MG PO TABS
500.0000 mg | ORAL_TABLET | Freq: Three times a day (TID) | ORAL | Status: AC
  Administered 2022-11-21 – 2022-11-23 (×5): 500 mg via ORAL
  Filled 2022-11-21 (×5): qty 1

## 2022-11-21 MED ORDER — LORAZEPAM 1 MG PO TABS
1.0000 mg | ORAL_TABLET | ORAL | Status: AC | PRN
  Filled 2022-11-21: qty 2
  Filled 2022-11-21: qty 1
  Filled 2022-11-21: qty 2

## 2022-11-21 MED ORDER — AMIODARONE IV BOLUS ONLY 150 MG/100ML
150.0000 mg | Freq: Once | INTRAVENOUS | Status: DC
  Filled 2022-11-21: qty 100

## 2022-11-21 MED ORDER — ORAL CARE MOUTH RINSE: 15.0000 mL | OROMUCOSAL | Status: DC | PRN

## 2022-11-21 MED ORDER — SODIUM CHLORIDE 0.9 % IV BOLUS
1000.0000 mL | Freq: Once | INTRAVENOUS | Status: AC
  Administered 2022-11-21: 1000 mL via INTRAVENOUS

## 2022-11-21 MED ORDER — SPIRITUS FRUMENTI
1.0000 | Freq: Three times a day (TID) | ORAL | Status: DC
  Administered 2022-11-21 – 2022-11-24 (×9): 1 via ORAL
  Filled 2022-11-21 (×13): qty 1

## 2022-11-21 MED ORDER — METOPROLOL TARTRATE 5 MG/5ML IV SOLN
5.0000 mg | Freq: Four times a day (QID) | INTRAVENOUS | Status: DC | PRN
Start: 2022-11-21 — End: 2022-11-29
  Administered 2022-11-21: 5 mg via INTRAVENOUS
  Filled 2022-11-21 (×2): qty 5

## 2022-11-21 MED ORDER — OXYCODONE HCL 5 MG PO TABS
5.0000 mg | ORAL_TABLET | ORAL | Status: DC | PRN
  Administered 2022-11-21: 5 mg via ORAL
  Administered 2022-11-22: 10 mg via ORAL
  Administered 2022-11-25 – 2022-11-26 (×2): 5 mg via ORAL
  Administered 2022-11-26 – 2022-11-28 (×2): 10 mg via ORAL
  Filled 2022-11-21: qty 2
  Filled 2022-11-21 (×2): qty 1
  Filled 2022-11-21 (×2): qty 2
  Filled 2022-11-21: qty 1

## 2022-11-21 MED ORDER — ACETAMINOPHEN 500 MG PO TABS
1000.0000 mg | ORAL_TABLET | Freq: Four times a day (QID) | ORAL | Status: DC
  Administered 2022-11-21 – 2022-11-29 (×24): 1000 mg via ORAL
  Filled 2022-11-21 (×26): qty 2

## 2022-11-21 MED ORDER — POLYETHYLENE GLYCOL 3350 17 G PO PACK: 17.0000 g | PACK | Freq: Every day | ORAL | Status: DC | PRN

## 2022-11-21 MED ORDER — METHOCARBAMOL 1000 MG/10ML IJ SOLN
500.0000 mg | Freq: Three times a day (TID) | INTRAMUSCULAR | Status: AC
  Administered 2022-11-21 – 2022-11-22 (×2): 500 mg via INTRAVENOUS
  Filled 2022-11-21 (×2): qty 10

## 2022-11-21 MED ORDER — SODIUM CHLORIDE 0.9 % IV SOLN: INTRAVENOUS | Status: AC

## 2022-11-21 MED ORDER — FENTANYL CITRATE PF 50 MCG/ML IJ SOSY
PREFILLED_SYRINGE | INTRAMUSCULAR | Status: AC
  Filled 2022-11-21: qty 1

## 2022-11-21 MED ORDER — FENTANYL CITRATE PF 50 MCG/ML IJ SOSY
50.0000 ug | PREFILLED_SYRINGE | Freq: Once | INTRAMUSCULAR | Status: AC
  Administered 2022-11-21: 50 ug via INTRAVENOUS

## 2022-11-21 MED ORDER — FOLIC ACID 1 MG PO TABS
1.0000 mg | ORAL_TABLET | Freq: Every day | ORAL | Status: DC
  Administered 2022-11-22 – 2022-11-29 (×8): 1 mg via ORAL
  Filled 2022-11-21 (×8): qty 1

## 2022-11-21 MED ORDER — DOCUSATE SODIUM 100 MG PO CAPS
100.0000 mg | ORAL_CAPSULE | Freq: Two times a day (BID) | ORAL | Status: DC
  Administered 2022-11-21 – 2022-11-29 (×16): 100 mg via ORAL
  Filled 2022-11-21 (×16): qty 1

## 2022-11-22 ENCOUNTER — Inpatient Hospital Stay (HOSPITAL_COMMUNITY): Payer: Self-pay

## 2022-11-22 LAB — BASIC METABOLIC PANEL
Anion gap: 8 (ref 5–15)
BUN: 6 mg/dL (ref 6–20)
CO2: 24 mmol/L (ref 22–32)
Calcium: 7.9 mg/dL — ABNORMAL LOW (ref 8.9–10.3)
Chloride: 105 mmol/L (ref 98–111)
Creatinine, Ser: 0.54 mg/dL — ABNORMAL LOW (ref 0.61–1.24)
GFR, Estimated: >60 mL/min (ref >60)
Glucose, Bld: 105 mg/dL — ABNORMAL HIGH (ref 70–99)
Potassium: 3.7 mmol/L (ref 3.5–5.1)
Sodium: 137 mmol/L (ref 135–145)

## 2022-11-22 LAB — CBC
HCT: 24.8 % — ABNORMAL LOW (ref 39.0–52.0)
Hemoglobin: 8.4 g/dL — ABNORMAL LOW (ref 13.0–17.0)
MCH: 32.2 pg (ref 26.0–34.0)
MCHC: 33.9 g/dL (ref 30.0–36.0)
MCV: 95 fL (ref 80.0–100.0)
Platelets: 86 10*3/uL — ABNORMAL LOW (ref 150–400)
RBC: 2.61 MIL/uL — ABNORMAL LOW (ref 4.22–5.81)
RDW: 13.3 % (ref 11.5–15.5)
WBC: 8.5 10*3/uL (ref 4.0–10.5)
nRBC: 0 % (ref 0.0–0.2)

## 2022-11-22 LAB — URINALYSIS, ROUTINE W REFLEX MICROSCOPIC
Bilirubin Urine: NEGATIVE
Glucose, UA: NEGATIVE mg/dL
Hgb urine dipstick: NEGATIVE
Ketones, ur: NEGATIVE mg/dL
Leukocytes,Ua: NEGATIVE
Nitrite: NEGATIVE
Protein, ur: NEGATIVE mg/dL
Specific Gravity, Urine: 1.005 (ref 1.005–1.030)
pH: 6 (ref 5.0–8.0)

## 2022-11-22 LAB — GLUCOSE, CAPILLARY
Glucose-Capillary: 105 mg/dL — ABNORMAL HIGH (ref 70–99)
Glucose-Capillary: 103 mg/dL — ABNORMAL HIGH (ref 70–99)
Glucose-Capillary: 120 mg/dL — ABNORMAL HIGH (ref 70–99)

## 2022-11-22 MED ORDER — LEVETIRACETAM 500 MG PO TABS
500.0000 mg | ORAL_TABLET | Freq: Two times a day (BID) | ORAL | Status: AC
  Administered 2022-11-22 – 2022-11-29 (×14): 500 mg via ORAL
  Filled 2022-11-22 (×14): qty 1

## 2022-11-22 MED ORDER — HALOPERIDOL LACTATE 5 MG/ML IJ SOLN
5.0000 mg | Freq: Four times a day (QID) | INTRAMUSCULAR | Status: DC | PRN
  Administered 2022-11-23 (×3): 10 mg via INTRAVENOUS
  Filled 2022-11-22 (×4): qty 2

## 2022-11-23 MED ORDER — CLONAZEPAM 0.25 MG PO TBDP
0.2500 mg | ORAL_TABLET | Freq: Two times a day (BID) | ORAL | Status: DC
  Administered 2022-11-23 – 2022-11-24 (×3): 0.25 mg via ORAL
  Filled 2022-11-23 (×3): qty 1

## 2022-11-23 MED ORDER — QUETIAPINE FUMARATE 50 MG PO TABS
50.0000 mg | ORAL_TABLET | Freq: Once | ORAL | Status: AC
  Administered 2022-11-23: 50 mg via ORAL
  Filled 2022-11-23: qty 1

## 2022-11-23 MED ORDER — QUETIAPINE FUMARATE 50 MG PO TABS
50.0000 mg | ORAL_TABLET | Freq: Two times a day (BID) | ORAL | Status: DC
  Administered 2022-11-23: 50 mg via ORAL
  Filled 2022-11-23: qty 1

## 2022-11-23 MED ORDER — QUETIAPINE FUMARATE 50 MG PO TABS
100.0000 mg | ORAL_TABLET | Freq: Two times a day (BID) | ORAL | Status: DC
  Administered 2022-11-23 – 2022-11-29 (×12): 100 mg via ORAL
  Filled 2022-11-23 (×12): qty 2

## 2022-11-23 MED ORDER — BACITRACIN ZINC 500 UNIT/GM EX OINT
TOPICAL_OINTMENT | Freq: Two times a day (BID) | CUTANEOUS | Status: DC
  Administered 2022-11-23 – 2022-11-28 (×11): 31.5 via TOPICAL
  Filled 2022-11-23 (×3): qty 28.35

## 2022-11-24 ENCOUNTER — Other Ambulatory Visit: Payer: Self-pay

## 2022-11-24 LAB — CBC
HCT: 23.5 % — ABNORMAL LOW (ref 39.0–52.0)
Hemoglobin: 7.9 g/dL — ABNORMAL LOW (ref 13.0–17.0)
MCH: 32.5 pg (ref 26.0–34.0)
MCHC: 33.6 g/dL (ref 30.0–36.0)
MCV: 96.7 fL (ref 80.0–100.0)
Platelets: 104 10*3/uL — ABNORMAL LOW (ref 150–400)
RBC: 2.43 MIL/uL — ABNORMAL LOW (ref 4.22–5.81)
RDW: 13.2 % (ref 11.5–15.5)
WBC: 5.4 10*3/uL (ref 4.0–10.5)
nRBC: 0 % (ref 0.0–0.2)

## 2022-11-24 MED ORDER — PHENOBARBITAL 32.4 MG PO TABS
32.4000 mg | ORAL_TABLET | Freq: Three times a day (TID) | ORAL | Status: DC
  Administered 2022-11-28 – 2022-11-29 (×4): 32.4 mg via ORAL
  Filled 2022-11-24 (×4): qty 1

## 2022-11-24 MED ORDER — ENOXAPARIN SODIUM 30 MG/0.3ML IJ SOSY
30.0000 mg | PREFILLED_SYRINGE | Freq: Two times a day (BID) | INTRAMUSCULAR | Status: DC
  Administered 2022-11-25 – 2022-11-29 (×7): 30 mg via SUBCUTANEOUS
  Filled 2022-11-24 (×9): qty 0.3

## 2022-11-24 MED ORDER — PHENOBARBITAL 32.4 MG PO TABS
97.2000 mg | ORAL_TABLET | Freq: Three times a day (TID) | ORAL | Status: AC
  Administered 2022-11-24 – 2022-11-26 (×6): 97.2 mg via ORAL
  Filled 2022-11-24 (×6): qty 3

## 2022-11-24 MED ORDER — LORAZEPAM 1 MG PO TABS
1.0000 mg | ORAL_TABLET | ORAL | Status: DC | PRN
  Administered 2022-11-26: 1 mg via ORAL
  Filled 2022-11-24 (×2): qty 1

## 2022-11-24 MED ORDER — PHENOBARBITAL 32.4 MG PO TABS
64.8000 mg | ORAL_TABLET | Freq: Three times a day (TID) | ORAL | Status: AC
  Administered 2022-11-26 – 2022-11-28 (×6): 64.8 mg via ORAL
  Filled 2022-11-24 (×6): qty 2

## 2022-11-24 MED ORDER — LORAZEPAM 1 MG PO TABS: 1.0000 mg | ORAL_TABLET | ORAL | Status: DC | PRN

## 2022-11-25 ENCOUNTER — Other Ambulatory Visit (HOSPITAL_COMMUNITY): Payer: Self-pay

## 2022-11-25 MED ORDER — QUETIAPINE FUMARATE 100 MG PO TABS
100.0000 mg | ORAL_TABLET | Freq: Every day | ORAL | 0 refills | Status: AC
  Filled 2022-11-25: qty 30, 30d supply, fill #0

## 2022-11-25 MED ORDER — ACETAMINOPHEN 500 MG PO TABS: 1000.0000 mg | ORAL_TABLET | Freq: Three times a day (TID) | ORAL | Status: AC | PRN

## 2022-11-25 MED ORDER — BACITRACIN ZINC 500 UNIT/GM EX OINT: TOPICAL_OINTMENT | Freq: Two times a day (BID) | CUTANEOUS | Status: AC

## 2022-11-25 MED ORDER — OXYCODONE HCL 5 MG PO TABS
5.0000 mg | ORAL_TABLET | Freq: Four times a day (QID) | ORAL | 0 refills | Status: AC | PRN
  Filled 2022-11-25: qty 10, 3d supply, fill #0

## 2022-11-25 MED ORDER — LEVETIRACETAM 500 MG PO TABS
500.0000 mg | ORAL_TABLET | Freq: Two times a day (BID) | ORAL | 0 refills | Status: AC
  Filled 2022-11-25: qty 6, 3d supply, fill #0

## 2022-11-26 ENCOUNTER — Other Ambulatory Visit (HOSPITAL_COMMUNITY): Payer: Self-pay

## 2022-11-27 MED ORDER — GABAPENTIN 300 MG PO CAPS
300.0000 mg | ORAL_CAPSULE | Freq: Two times a day (BID) | ORAL | Status: DC
  Administered 2022-11-27 – 2022-11-29 (×5): 300 mg via ORAL
  Filled 2022-11-27 (×5): qty 1

## 2022-11-27 MED ORDER — SPIRITUS FRUMENTI
1.0000 | Freq: Three times a day (TID) | ORAL | Status: DC | PRN
  Administered 2022-11-27 – 2022-11-28 (×3): 1 via ORAL
  Filled 2022-11-27 (×6): qty 1

## 2022-11-29 ENCOUNTER — Encounter (HOSPITAL_COMMUNITY): Payer: Self-pay

## 2022-11-29 ENCOUNTER — Other Ambulatory Visit: Payer: Self-pay

## 2022-11-29 NOTE — Plan of Care (Signed)
  Problem: Clinical Measurements: Goal: Will remain free from infection Outcome: Progressing   Problem: Nutrition: Goal: Adequate nutrition will be maintained Outcome: Progressing   Problem: Elimination: Goal: Will not experience complications related to bowel motility Outcome: Progressing Goal: Will not experience complications related to urinary retention Outcome: Progressing   Problem: Pain Management: Goal: General experience of comfort will improve Outcome: Progressing   Problem: Safety: Goal: Ability to remain free from injury will improve Outcome: Progressing   Problem: Skin Integrity: Goal: Risk for impaired skin integrity will decrease Outcome: Progressing
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
## 2022-11-29 NOTE — Progress Notes (Signed)
Occupational Therapy Treatment Patient Details Name: [name] MRN: [id number] DOB: 02-18-1970 Today's Date: 11/29/2022   History of present illness 52 yo male admitted 11/5 with facial trauma found down in park ETOH (+) imaging Small parafalcine sdh, scattered bilateral SAH, small bilateral temporal lobe contusion. PMH alcohol abuse   OT comments  Sitter assisted with interpreting with patient. Sitter states patient has been walking in room without RW. Patient able to get to EOB without assistance and performed mobility in room without RW and CGA while performing dressing and grooming at sink. Mobility performed in hallway with RW and improved balance with support. Patient to continue to be followed by acute OT to address dressing and functional transfers.      If plan is discharge home, recommend the following:  A little help with walking and/or transfers;Assistance with cooking/housework;Assistance with feeding;Direct supervision/assist for medications management;Direct supervision/assist for financial management;Assist for transportation;Help with stairs or ramp for entrance;Supervision due to cognitive status;A little help with bathing/dressing/bathroom   Equipment Recommendations  Other (comment) (RW)    Recommendations for Other Services      Precautions / Restrictions Precautions Precautions: Fall Precaution CommentsTEFL teacher; cleared from C collar Restrictions Weight Bearing Restrictions: No       Mobility Bed Mobility Overal bed mobility: Modified Independent             General bed mobility comments: able to get out of bed without assistance, in recliner at end of session.    Transfers Overall transfer level: Needs assistance Equipment used: Rolling walker (2 wheels), None Transfers: Sit to/from Stand Sit to Stand: Supervision, Contact guard assist           General transfer comment: walking in room without RW and instructed ot use RW for  safety     Balance Overall balance assessment: Needs assistance Sitting-balance support: No upper extremity supported, Feet supported Sitting balance-Leahy Scale: Good     Standing balance support: No upper extremity supported, During functional activity Standing balance-Leahy Scale: Fair Standing balance comment: stood at sink for selfcare without UE support, improved balance with RW for dynamic standing                           ADL either performed or assessed with clinical judgement   ADL Overall ADL's : Needs assistance/impaired     Grooming: Wash/dry hands;Wash/dry face;Oral care;Contact guard assist;Cueing for sequencing;Standing Grooming Details (indicate cue type and reason): cues for sequencing         Upper Body Dressing : Supervision/safety;Sitting Upper Body Dressing Details (indicate cue type and reason): donn pullover paper scrub Lower Body Dressing: Contact guard assist;Sit to/from stand Lower Body Dressing Details (indicate cue type and reason): donned paper scrub pants                    Extremity/Trunk Assessment              Vision       Perception     Praxis      Cognition Arousal: Alert Behavior During Therapy: Impulsive Overall Cognitive Status: No family/caregiver present to determine baseline cognitive functioning Area of Impairment: Attention, Memory, Following commands, Safety/judgement, Awareness, Problem solving                       Following Commands: Follows one step commands with increased time, Follows multi-step commands inconsistently   Awareness: Intellectual Problem Solving: Requires verbal  cues General Comments: requires increased time to follow directions, frequent cues for safety        Exercises      Shoulder Instructions       General Comments      Pertinent Vitals/ Pain       Pain Assessment Pain Assessment: Faces Faces Pain Scale: No hurt Pain Intervention(s): Monitored  during session  Home Living                                          Prior Functioning/Environment              Frequency  Min 1X/week        Progress Toward Goals  OT Goals(current goals can now be found in the care plan section)  Progress towards OT goals: Progressing toward goals  Acute Rehab OT Goals Patient Stated Goal: none stated OT Goal Formulation: With patient Time For Goal Achievement: 12/06/22 Potential to Achieve Goals: Good ADL Goals Pt Will Perform Lower Body Dressing: with modified independence;sit to/from stand Pt Will Transfer to Toilet: with modified independence;ambulating;regular height toilet Additional ADL Goal #1: pt will complete visual scanning task with less than 2 errors  Plan      Co-evaluation                 AM-PAC OT "6 Clicks" Daily Activity     Outcome Measure   Help from another person eating meals?: None Help from another person taking care of personal grooming?: A Little Help from another person toileting, which includes using toliet, bedpan, or urinal?: A Little Help from another person bathing (including washing, rinsing, drying)?: A Little Help from another person to put on and taking off regular upper body clothing?: A Little Help from another person to put on and taking off regular lower body clothing?: A Little 6 Click Score: 19    End of Session Equipment Utilized During Treatment: Gait belt;Rolling walker (2 wheels)  OT Visit Diagnosis: Unsteadiness on feet (R26.81);Repeated falls (R29.6);Muscle weakness (generalized) (M62.81)   Activity Tolerance Patient tolerated treatment well   Patient Left in chair;with call bell/phone within reach;with nursing/sitter in room   Nurse Communication Mobility status;Precautions        Time: 1610-9604 OT Time Calculation (min): 18 min  Charges: OT General Charges $OT Visit: 1 Visit OT Treatments $Self Care/Home Management : 8-22 mins  Alfonse Flavors, OTA Acute Rehabilitation Services  Office [phone]   Dewain Penning 11/29/2022, 1:46 PM

## 2022-11-29 NOTE — Progress Notes (Signed)
Physical Therapy Treatment Patient Details Name: Samuel [name] MRN: [id number] DOB: 1970/10/30 Today's Date: 11/29/2022   History of Present Illness 52 yo male admitted 11/5 with facial trauma found down in park ETOH (+) imaging Small parafalcine sdh, scattered bilateral SAH, small bilateral temporal lobe contusion. PMH alcohol abuse    PT Comments  Pt received in recliner, pt pleasantly agreeable to therapy session with emphasis on transfer and gait safety using RW. Pt with continued short term memory deficits and decreased carryover of safety cues. Worked on reciprocal transfers with [employer] strategy to reinforce safety cues and pt needing Supervision for gait using RW, no buckling or LOB but pt tends to keep RW too far advanced unless frequently cued. Worked also on wayfinding in hallway and encouraging pt to keep attention on environment to anticipate possible obstacles. Pt continues to benefit from PT services to progress toward functional mobility goals.     If plan is discharge home, recommend the following: A little help with walking and/or transfers;A little help with bathing/dressing/bathroom   Can travel by private vehicle        Equipment Recommendations  Rolling walker (2 wheels)    Recommendations for Other Services       Precautions / Restrictions Precautions Precautions: Fall Precaution CommentsTEFL teacher; cleared from C collar Restrictions Weight Bearing Restrictions: No     Mobility  Bed Mobility Overal bed mobility: Modified Independent             General bed mobility comments: received up in chair    Transfers Overall transfer level: Needs assistance Equipment used: Rolling walker (2 wheels), None Transfers: Sit to/from Stand Sit to Stand: Supervision, Contact guard assist           General transfer comment: cues for pushing from/reaching back to chair, able to stand with and without arm rest support, pt forgets to reach back  and needs tactile cues to place hands back to prevent sitting on arm rest/edge of bench. x10 reps STS total    Ambulation/Gait Ambulation/Gait assistance: Supervision Gait Distance (Feet): 350 Feet Assistive device: Rolling walker (2 wheels) Gait Pattern/deviations: Step-through pattern, Decreased stride length, Drifts right/left       General Gait Details: Minor drift, cues to anticipate and adjust early when approaching obstacles. No buckling or overt LOB with RW for support. Cues for closer proximity to device and to remain centered in RW as he drifts out at times with turns. Cues for wayfinding and worked on pt looking at room numbers on wall to locate his room, pt unable to realize that he needs to turn around when numbers going "up" (pt room number is lower) and needs dense cues to turn around then when pt finds his room, he does not initially seem aware, needs reassurance that it is correct room. Pt admits to low/no vision in L eye (significant swelling in L side of face) and pt cued for visual scanning to L/R sides of hallway and room when ambulating, pt tends to look down when not cued.   Stairs             Wheelchair Mobility     Tilt Bed    Modified Rankin (Stroke Patients Only)       Balance Overall balance assessment: Needs assistance Sitting-balance support: No upper extremity supported, Feet supported Sitting balance-Leahy Scale: Good     Standing balance support: No upper extremity supported, During functional activity Standing balance-Leahy Scale: Fair Standing balance comment: improved with  RW                            Cognition Arousal: Alert Behavior During Therapy: WFL for tasks assessed/performed, Impulsive Overall Cognitive Status: No family/caregiver present to determine baseline cognitive functioning Area of Impairment: Safety/judgement, Memory                 Orientation Level: Disoriented to, Time, Situation   Memory:  Decreased short-term memory Following Commands: Follows multi-step commands inconsistently, Follows one step commands consistently Safety/Judgement: Decreased awareness of safety Awareness: Intellectual, Emergent (some emergent awareness today) Problem Solving: Requires verbal cues, Requires tactile cues General Comments: Some tactile cues with transfers needed, pt tends to sit prematurely still/to abandon RW prior reaching chair. Pt likely closer to his baseline at this time.        Exercises Other Exercises Other Exercises: STS x 10 reps for BLE strengthening and safety instruction    General Comments General comments (skin integrity, edema, etc.): No acute s/sx distress other than L sided facial pain (moderate)      Pertinent Vitals/Pain Pain Assessment Pain Assessment: 0-10 Pain Score: 5  Pain Location: L eye/face, a little in my hands Pain Descriptors / Indicators: Tingling, Discomfort Pain Intervention(s): Monitored during session, Repositioned    Home Living                          Prior Function            PT Goals (current goals can now be found in the care plan section) Acute Rehab PT Goals PT Goal Formulation: With patient Time For Goal Achievement: 12/06/22 Progress towards PT goals: Progressing toward goals    Frequency    Min 1X/week      PT Plan      Co-evaluation              AM-PAC PT "6 Clicks" Mobility   Outcome Measure  Help needed turning from your back to your side while in a flat bed without using bedrails?: None Help needed moving from lying on your back to sitting on the side of a flat bed without using bedrails?: None Help needed moving to and from a bed to a chair (including a wheelchair)?: A Little Help needed standing up from a chair using your arms (e.g., wheelchair or bedside chair)?: A Little Help needed to walk in hospital room?: A Little Help needed climbing 3-5 steps with a railing? : A Little 6 Click  Score: 20    End of Session Equipment Utilized During Treatment: Gait belt Activity Tolerance: Patient tolerated treatment well Patient left: in chair;with call bell/phone within reach;with nursing/sitter in room (sitter in room) Nurse Communication: Mobility status PT Visit Diagnosis: Other abnormalities of gait and mobility (R26.89);Muscle weakness (generalized) (M62.81)     Time: 7829-5621 PT Time Calculation (min) (ACUTE ONLY): 14 min  Charges:    $Therapeutic Activity: 8-22 mins PT General Charges $$ ACUTE PT VISIT: 1 Visit                     Samuel Thoma P., PTA Acute Rehabilitation Services Secure Chat Preferred 9a-5:30pm Office: [phone]    Samuel [name] [facility] 11/29/2022, 3:53 PM

## 2022-11-29 NOTE — TOC Progression Note (Signed)
Transition of Care [facility]) - Progression Note    Patient Details  Name: Samuel [name] MRN: [id number] Date of Birth: July 21, 1970  Transition of Care [facility]) CM/SW Contact  Glennon Mac, RN Phone Number: 11/29/2022, 2:11 PM  Clinical Narrative:    Multiple attempts to reach patient's friend, Byrd Hesselbach, to discuss assistance with transportation home.   Able to reach [facility], and she put her daughter, Leavy Cella on the phone to interpret.  Jasmine states she and her mother will be home, and they are agreeable for patient to be discharged home in a taxi to home address.  They will be able to assist with care needed. Provider is agreeable to discharge plan.  Will provide taxi voucher and clothing for patient.  Patient given pants, tee-shirt and socks for transport home.  Nurse given taxi voucher to [address]. [place], [name] [address]. Patient appreciative of assistance.   Expected Discharge Plan: OP Rehab Barriers to Discharge: Continued Medical Work up  Expected Discharge Plan and Services   Discharge Planning Services: CM Consult   Living arrangements for the past 2 months: Homeless Expected Discharge Date: 11/26/22                                     Social Determinants of Health (SDOH) Interventions SDOH Screenings   Food Insecurity: Food Insecurity Present (11/29/2022)  Transportation Needs: Unmet Transportation Needs (11/29/2022)  Utilities: At Risk (11/29/2022)  Tobacco Use: High Risk (11/29/2022)    Readmission Risk Interventions     No data to display         Quintella Baton, RN, BSN  Trauma/Neuro ICU Case Manager [phone]

## 2022-11-30 ENCOUNTER — Other Ambulatory Visit (HOSPITAL_COMMUNITY): Payer: Self-pay

## 2022-12-02 ENCOUNTER — Institutional Professional Consult (permissible substitution) (INDEPENDENT_AMBULATORY_CARE_PROVIDER_SITE_OTHER): Payer: Self-pay | Admitting: Otolaryngology

## 2022-12-12 ENCOUNTER — Other Ambulatory Visit (HOSPITAL_COMMUNITY): Payer: Self-pay

## 2023-01-24 ENCOUNTER — Emergency Department (HOSPITAL_COMMUNITY)
Admission: EM | Admit: 2023-01-24 | Discharge: 2023-01-25 | Disposition: A | Discharge status: Home / Self Care | Payer: Self-pay | Attending: Emergency Medicine | Admitting: Emergency Medicine

## 2023-01-24 ENCOUNTER — Ambulatory Visit: Payer: Self-pay | Admitting: Nurse Practitioner

## 2023-01-24 ENCOUNTER — Other Ambulatory Visit: Payer: Self-pay

## 2023-01-24 DIAGNOSIS — R202 Paresthesia of skin: Secondary | ICD-10-CM | POA: Insufficient documentation

## 2023-01-24 DIAGNOSIS — F1010 Alcohol abuse, uncomplicated: Secondary | ICD-10-CM | POA: Insufficient documentation

## 2023-01-24 DIAGNOSIS — F1090 Alcohol use, unspecified, uncomplicated: Secondary | ICD-10-CM

## 2023-01-24 LAB — CBC WITH DIFFERENTIAL/PLATELET
Abs Immature Granulocytes: 0.01 10*3/uL (ref 0.00–0.07)
Basophils Absolute: 0.1 10*3/uL (ref 0.0–0.1)
Basophils Relative: 1 %
Eosinophils Absolute: 0.2 10*3/uL (ref 0.0–0.5)
Eosinophils Relative: 3 %
HCT: 32.8 % — ABNORMAL LOW (ref 39.0–52.0)
Hemoglobin: 10.1 g/dL — ABNORMAL LOW (ref 13.0–17.0)
Immature Granulocytes: 0 %
Lymphocytes Relative: 17 %
Lymphs Abs: 1.1 10*3/uL (ref 0.7–4.0)
MCH: 26.6 pg (ref 26.0–34.0)
MCHC: 30.8 g/dL (ref 30.0–36.0)
MCV: 86.5 fL (ref 80.0–100.0)
Monocytes Absolute: 0.7 10*3/uL (ref 0.1–1.0)
Monocytes Relative: 12 %
Neutro Abs: 4.1 10*3/uL (ref 1.7–7.7)
Neutrophils Relative %: 67 %
Platelets: 209 10*3/uL (ref 150–400)
RBC: 3.79 MIL/uL — ABNORMAL LOW (ref 4.22–5.81)
RDW: 17.4 % — ABNORMAL HIGH (ref 11.5–15.5)
WBC: 6.2 10*3/uL (ref 4.0–10.5)
nRBC: 0 % (ref 0.0–0.2)

## 2023-01-24 LAB — COMPREHENSIVE METABOLIC PANEL
ALT: 23 U/L (ref 0–44)
AST: 48 U/L — ABNORMAL HIGH (ref 15–41)
Albumin: 4.1 g/dL (ref 3.5–5.0)
Alkaline Phosphatase: 120 U/L (ref 38–126)
Anion gap: 9 (ref 5–15)
BUN: 9 mg/dL (ref 6–20)
CO2: 20 mmol/L — ABNORMAL LOW (ref 22–32)
Calcium: 9.1 mg/dL (ref 8.9–10.3)
Chloride: 112 mmol/L — ABNORMAL HIGH (ref 98–111)
Creatinine, Ser: 0.7 mg/dL (ref 0.61–1.24)
GFR, Estimated: >60 mL/min (ref >60)
Glucose, Bld: 116 mg/dL — ABNORMAL HIGH (ref 70–99)
Potassium: 4.3 mmol/L (ref 3.5–5.1)
Sodium: 141 mmol/L (ref 135–145)
Total Bilirubin: 0.3 mg/dL (ref 0.0–1.2)
Total Protein: 7.9 g/dL (ref 6.5–8.1)

## 2023-01-24 NOTE — ED Triage Notes (Addendum)
 Pt is homeless and coming from mobile clinic with complaints of cold hand and feet. EMS called out for afib and EMS reports the EKG showed NSR. PT was recently in Neuro ICU and had episode of brief afib not sent home on medications. Recent falls. Spanish speaking. BP 168/92, HR 120.   Updated at 1430 via interpreter. Pt states that he has been having numbness from knees down since falling one month ago. Pt states only had some beer this am.  Provider saw patient.

## 2023-01-24 NOTE — ED Provider Triage Note (Signed)
 Emergency Medicine Provider Triage Evaluation Note  Samuel [name] , a 53 y.o. male  was evaluated in triage.  Pt complains of persistent numbness in his hands and legs since a fall he had a month ago.  Had been admitted to ICU at that time for head trauma.  Review of Systems  Positive:  Negative:   Physical Exam  BP 128/78 (BP Location: Right Arm)   Pulse (!) 130   Temp 98.5 F (36.9 C) (Oral)   Resp 18   SpO2 98%  Gen:   Awake, no distress   Resp:  Normal effort  MSK:   Moves extremities without difficulty , 5 out of 5 strength Other:  Describes decreased sensation in hands and lower extremities otherwise no other neurodeficit  Medical Decision Making  Medically screening exam initiated at 2:41 PM.  Appropriate orders placed.  Samuel [facility] Sheela Caprice was informed that the remainder of the evaluation will be completed by another provider, this initial triage assessment does not replace that evaluation, and the importance of remaining in the ED until their evaluation is complete.     Donnajean Lynwood DEL, PA-C 01/24/23 1447

## 2023-01-24 NOTE — Progress Notes (Signed)
 New pt. HIPAA forms signed and pt oriented to [facility]. Interpreter used for communication [phone] Non-fasting FSBS 113. Pt reported being in hospital in Nov for fall. Reviewed episode of ED visit and asked about noted atrial fibrillation. Pt states he is unaware of atrial fib diagnosis. Was released from hospital with discharge instructions but lost them and did not attend any follow up visits. Pt currently not on any medications F/U appt for ENT but no cardiologist appt. EKG performed. NP reports  Atrial Fib, Discussed with pt need for transport to ED. Pt agrees. EMS called. Transported to hospital.

## 2023-01-24 NOTE — Progress Notes (Signed)
 Office Visit  Subjective   Patient ID: Samuel [name]   DOB: 1970-04-24   Age: 53 y.o.   MRN: [id number]   Chief Complaint No chief complaint on file.    History of Present Illness PT presents with complaint of numbness in bilateral hands and feet. During vital sign check elevated heart rate noted. PT was diagnosed with a fib after admission for a fall and traumatic brain injury in November. Pt speaks Spanish and teleinterpreter utilized to collect all information.       Past Medical History Past Medical History:  Diagnosis Date   Alcohol  use    ETOH abuse      Allergies No Known Allergies   Medications  Current Outpatient Medications:    acetaminophen  (TYLENOL ) 500 MG tablet, Take 2 tablets (1,000 mg total) by mouth every 8 (eight) hours as needed for mild pain (pain score 1-3) or headache. (Patient not taking: Reported on 01/24/2023), Disp: , Rfl:    bacitracin  ointment, Apply topically 2 (two) times daily. (Patient not taking: Reported on 01/24/2023), Disp: , Rfl:    chlordiazePOXIDE  (LIBRIUM ) 25 MG capsule, 50mg  PO TID x 1D, then 25-50mg  PO BID X 1D, then 25-50mg  PO QD X 1D (Patient not taking: Reported on 01/24/2023), Disp: 10 capsule, Rfl: 0   folic acid  (FOLVITE ) 1 MG tablet, Take 1 tablet (1 mg total) by mouth daily. (Patient not taking: Reported on 01/24/2023), Disp: 100 tablet, Rfl: 1   levETIRAcetam  (KEPPRA ) 500 MG tablet, Take 1 tablet (500 mg total) by mouth 2 (two) times daily for 3 days., Disp: 6 tablet, Rfl: 0   magnesium  oxide (MAG-OX) 400 MG tablet, Take 2 tablets (800 mg total) by mouth 2 (two) times daily. (Patient not taking: Reported on 01/24/2023), Disp: 60 tablet, Rfl: 0   Multiple Vitamin (MULTIVITAMIN WITH MINERALS) TABS tablet, Take 1 tablet by mouth daily. (Patient not taking: Reported on 01/24/2023), Disp: 30 tablet, Rfl: 0   naltrexone  (DEPADE) 50 MG tablet, Take 1 tablet (50 mg total) by mouth daily. (Patient not taking: Reported on 01/24/2023), Disp:  30 tablet, Rfl: 0   oxyCODONE  (OXY IR/ROXICODONE ) 5 MG immediate release tablet, Take 1 tablet (5 mg total) by mouth every 6 (six) hours as needed for moderate pain (pain score 4-6) or severe pain (pain score 7-10). (Patient not taking: Reported on 01/24/2023), Disp: 10 tablet, Rfl: 0   QUEtiapine  (SEROQUEL ) 100 MG tablet, Take 1 tablet (100 mg total) by mouth at bedtime., Disp: 30 tablet, Rfl: 0   thiamine  (VITAMIN B1) 100 MG tablet, Take 1 tablet (100 mg total) by mouth daily. (Patient not taking: Reported on 01/24/2023), Disp: 100 tablet, Rfl: 1   Review of Systems Review of Systems  Constitutional: Negative.   HENT: Negative.    Eyes: Negative.   Respiratory: Negative.    Cardiovascular:  Negative for chest pain.       Rapid, intermittently irregular heart beat noted on auscultation. Runs of a fib noted on EKG.   Gastrointestinal: Negative.   Genitourinary: Negative.   Musculoskeletal: Negative.  Negative for falls.  Skin: Negative.   Neurological:  Positive for tingling and sensory change.       Pt complaints of bilateral numbness and tingling hands and lower extremities below the knees.        Objective:    Vitals BP (!) 148/88 (BP Location: Right Arm, Patient Position: Sitting, Cuff Size: Normal)   Pulse (!) 118   Temp 98.9 F (37.2  C) (Oral)   Resp 18   Ht 5' 3 (1.6 m)   Wt 143 lb (64.9 kg)   SpO2 96%   BMI 25.33 kg/m    Physical Examination Physical Exam Constitutional:      Appearance: He is ill-appearing. He is not diaphoretic.  Eyes:     Pupils: Pupils are equal, round, and reactive to light.  Cardiovascular:     Rate and Rhythm: Tachycardia present. Rhythm irregular.     Comments: Rapid, intermittently irregular heart beat noted on auscultation. Runs of a fib noted on EKG.  Pulmonary:     Effort: Pulmonary effort is normal.  Neurological:     Mental Status: He is alert and oriented to person, place, and time.     Cranial Nerves: No cranial nerve deficit.      Motor: No weakness.     Coordination: Coordination normal.     Gait: Gait normal.  Psychiatric:        Mood and Affect: Mood normal.        Behavior: Behavior normal.        Assessment & Plan:   Refer to EMS for further assessment. Pt transported to ED.     No follow-ups on file.   Particia Saver, NP

## 2023-01-25 MED ORDER — FOLIC ACID 1 MG PO TABS: 1.0000 mg | ORAL_TABLET | Freq: Every day | ORAL | 0 refills | Status: AC

## 2023-01-25 MED ORDER — THIAMINE MONONITRATE 100 MG PO TABS
100.0000 mg | ORAL_TABLET | Freq: Every day | ORAL | Status: DC
  Administered 2023-01-25: 100 mg via ORAL
  Filled 2023-01-25: qty 1

## 2023-01-25 MED ORDER — THIAMINE HCL 100 MG PO TABS: 100.0000 mg | ORAL_TABLET | Freq: Every day | ORAL | 0 refills | Status: AC

## 2023-01-25 MED ORDER — FOLIC ACID 1 MG PO TABS
1.0000 mg | ORAL_TABLET | Freq: Once | ORAL | Status: AC
  Administered 2023-01-25: 1 mg via ORAL
  Filled 2023-01-25: qty 1

## 2023-01-25 NOTE — ED Notes (Signed)
EDP at bedside with interpreter. 

## 2023-01-25 NOTE — Discharge Instructions (Signed)
 You were seen in the emergency department for your numbness.  Your electrolytes were normal and you had no signs of stroke.  It is possible this could be related to vitamin deficiencies in the setting of your alcohol  use.  I have given you prescription for vitamins he should take daily to help with your symptoms and you should follow-up with your primary doctor to have your symptoms rechecked to see if you may need any further workup.  You should return to the emergency department if you have numbness or weakness on one side the body compared to the other, you are becoming too weak to walk, you are unable to urinate or if you have any other new or concerning symptoms.  Lo examinaron en el departamento de urgencias por su entumecimiento. Sus niveles de electrolitos eran normales y no tena signos de accidente cerebrovascular. Es posible que esto est relacionado con deficiencias de vitaminas en el contexto de su consumo de alcohol . Le he recetado vitaminas que debe tomar a diario para aliviar sus sntomas y debe consultar con su mdico de cabecera para que le vuelvan a controlar los sntomas y ver si necesita ms estudios. Debe regresar al departamento de urgencias si tiene entumecimiento o debilidad en un lado del cuerpo en comparacin con el otro, se est debilitando demasiado para caminar, no puede orinar o si tiene otros sntomas nuevos o preocupantes.

## 2023-01-25 NOTE — ED Provider Notes (Signed)
 [facility] Provider Note   CSN: [id number] Arrival date & time: 01/24/23  1316     History  Chief Complaint  Patient presents with   Numbness    tachycardia    [name] is a 53 y.o. male.  Patient is a 53 year old male with a past medical history of alcohol  use disorder who was admitted to the hospital about 2 months ago with subdural and subarachnoid hemorrhage after a fall presenting to the emergency department with numbness.  The patient reports he has had numbness in his bilateral hands and in his bilateral legs from the knee down since the fall.  He states that he was initially having a lot of pain but now the pain has resolved.  He states that the numbness gets worse in his hands some more that he uses them.  He states that he did mention this to someone in the hospital and was told that he needed to walk more to help.  He states that he has had no associated weakness, no neck or back pain, no fevers, no saddle anesthesia, no loss of bowel or bladder function.  He states that he is still drinking 3-4 beers a day.  The history is provided by the patient. A language interpreter was used [employer] Eddie ID [phone]).       Home Medications Prior to Admission medications   Medication Sig Start Date End Date Taking? Authorizing Provider  folic acid  (FOLVITE ) 1 MG tablet Take 1 tablet (1 mg total) by mouth daily. 01/25/23  Yes Ellouise, Larsen Zettel K, DO  thiamine  (VITAMIN B1) 100 MG tablet Take 1 tablet (100 mg total) by mouth daily. 01/25/23  Yes Kingsley, Jashawna Reever K, DO  acetaminophen  (TYLENOL ) 500 MG tablet Take 2 tablets (1,000 mg total) by mouth every 8 (eight) hours as needed for mild pain (pain score 1-3) or headache. Patient not taking: Reported on 01/24/2023 11/25/22   Vicci Burnard SAUNDERS, PA-C  bacitracin  ointment Apply topically 2 (two) times daily. Patient not taking: Reported on 01/24/2023 11/25/22   Vicci Burnard SAUNDERS,  PA-C  chlordiazePOXIDE  (LIBRIUM ) 25 MG capsule 50mg  PO TID x 1D, then 25-50mg  PO BID X 1D, then 25-50mg  PO QD X 1D Patient not taking: Reported on 01/24/2023 08/09/22   Patt Alm Macho, MD  folic acid  (FOLVITE ) 1 MG tablet Take 1 tablet (1 mg total) by mouth daily. Patient not taking: Reported on 01/24/2023 11/19/21   Vicci Barnie NOVAK, MD  levETIRAcetam  (KEPPRA ) 500 MG tablet Take 1 tablet (500 mg total) by mouth 2 (two) times daily for 3 days. 11/25/22 11/28/22  Vicci Burnard SAUNDERS, PA-C  magnesium  oxide (MAG-OX) 400 MG tablet Take 2 tablets (800 mg total) by mouth 2 (two) times daily. Patient not taking: Reported on 01/24/2023 10/21/21   Elgergawy, Brayton RAMAN, MD  Multiple Vitamin (MULTIVITAMIN WITH MINERALS) TABS tablet Take 1 tablet by mouth daily. Patient not taking: Reported on 01/24/2023 10/22/21   Elgergawy, Brayton RAMAN, MD  naltrexone  (DEPADE) 50 MG tablet Take 1 tablet (50 mg total) by mouth daily. Patient not taking: Reported on 01/24/2023 11/20/21   Vicci Barnie NOVAK, MD  oxyCODONE  (OXY IR/ROXICODONE ) 5 MG immediate release tablet Take 1 tablet (5 mg total) by mouth every 6 (six) hours as needed for moderate pain (pain score 4-6) or severe pain (pain score 7-10). Patient not taking: Reported on 01/24/2023 11/25/22   Vicci Burnard SAUNDERS, PA-C  QUEtiapine  (SEROQUEL ) 100 MG tablet  Take 1 tablet (100 mg total) by mouth at bedtime. 11/25/22 12/25/22  Vicci Burnard SAUNDERS, PA-C  thiamine  (VITAMIN B1) 100 MG tablet Take 1 tablet (100 mg total) by mouth daily. Patient not taking: Reported on 01/24/2023 11/19/21   Vicci Barnie NOVAK, MD      Allergies    Patient has no known allergies.    Review of Systems   Review of Systems  Physical Exam Updated Vital Signs BP (!) 140/80   Pulse 90   Temp 98.6 F (37 C)   Resp 18   SpO2 98%  Physical Exam Vitals and nursing note reviewed.  Constitutional:      General: He is not in acute distress.    Appearance: Normal appearance.  HENT:     Head: Normocephalic and  atraumatic.     Nose: Nose normal.     Mouth/Throat:     Mouth: Mucous membranes are moist.     Pharynx: Oropharynx is clear.  Eyes:     Extraocular Movements: Extraocular movements intact.     Conjunctiva/sclera: Conjunctivae normal.     Pupils: Pupils are equal, round, and reactive to light.     Comments: No nystagmus  Neck:     Comments: No midline neck tenderness Cardiovascular:     Rate and Rhythm: Normal rate and regular rhythm.     Pulses: Normal pulses.     Heart sounds: Normal heart sounds.  Pulmonary:     Effort: Pulmonary effort is normal.     Breath sounds: Normal breath sounds.  Abdominal:     General: Abdomen is flat.     Palpations: Abdomen is soft.     Tenderness: There is no abdominal tenderness.  Musculoskeletal:        General: Normal range of motion.     Cervical back: Normal range of motion and neck supple.     Comments: No midline back tenderness  Skin:    General: Skin is warm and dry.     Findings: No rash.  Neurological:     Mental Status: He is alert and oriented to person, place, and time.     Cranial Nerves: No cranial nerve deficit.     Motor: No weakness.     Coordination: Coordination normal.     Comments: 5 out of 5 strength in bilateral grip strength, elbow flexion/extension, shoulder abduction, plantar/dorsi flexion, hip flexion, knee extension Sensation intact and equal in bilateral upper extremities, subjective decrease sensation in bilateral shins and feet  Psychiatric:        Mood and Affect: Mood normal.        Behavior: Behavior normal.     ED Results / Procedures / Treatments   Labs (all labs ordered are listed, but only abnormal results are displayed) Labs Reviewed  CBC WITH DIFFERENTIAL/PLATELET - Abnormal; Notable for the following components:      Result Value   RBC 3.79 (*)    Hemoglobin 10.1 (*)    HCT 32.8 (*)    RDW 17.4 (*)    All other components within normal limits  COMPREHENSIVE METABOLIC PANEL - Abnormal;  Notable for the following components:   Chloride 112 (*)    CO2 20 (*)    Glucose, Bld 116 (*)    AST 48 (*)    All other components within normal limits    EKG EKG Interpretation Date/Time:  Tuesday January 24 2023 14:50:12 EST Ventricular Rate:  124 PR Interval:  130 QRS Duration:  80  QT Interval:  306 QTC Calculation: 439 R Axis:   32  Text Interpretation: Sinus tachycardia Nonspecific ST and T wave abnormality Abnormal ECG No significant change since last tracing Confirmed by Ellouise Fine ([id number]) on 01/25/2023 7:23:56 AM  Radiology No results found.  Procedures Procedures    Medications Ordered in ED Medications  thiamine  (VITAMIN B1) tablet 100 mg (has no administration in time range)  folic acid  (FOLVITE ) tablet 1 mg (has no administration in time range)    ED Course/ Medical Decision Making/ A&P                                 Medical Decision Making This patient presents to the ED with chief complaint(s) of numbness of hands and feet with pertinent past medical history of alcohol  use disorder, recent traumatic ICH which further complicates the presenting complaint. The complaint involves an extensive differential diagnosis and also carries with it a high risk of complications and morbidity.    The differential diagnosis includes patient has no focal neurologic deficits making ICH, mass effect or CVA unlikely, considering electrolyte abnormality, vitamin deficiency, peripheral neuropathy, no back pain, no weakness, saddle anesthesia or loss of bowel or bladder function making cauda equina unlikely  Additional history obtained: Additional history obtained from N/A Records reviewed previous admission documents  ED Course and Reassessment: On patient's arrival he was hemodynamically stable in no acute distress.  Was initially reported prearrival to be in A-fib however was sinus for EMS and has been in normal sinus rhythm since he has been in the emergency  department on EKG and monitoring here.  The patient had labs performed in triage that were within normal range without significant electrolyte derangement.  Suspect his numbness may be secondary to thiamine  deficiency in the setting of his alcohol  use and will be started on supplements and recommended primary care follow-up.  Patient was given strict return precautions.  Independent labs interpretation:  The following labs were independently interpreted: no acute abnormality  Independent visualization of imaging: - N/A  Consultation: - Consulted or discussed management/test interpretation w/ external professional: N/A  Consideration for admission or further workup: Patient has no emergent conditions requiring admission or further work-up at this time and is stable for discharge home with primary care follow-up  Social Determinants of health: ETOH use disorder    Risk OTC drugs.          Final Clinical Impression(s) / ED Diagnoses Final diagnoses:  Paresthesia  Alcohol  use disorder    Rx / DC Orders ED Discharge Orders          Ordered    thiamine  (VITAMIN B1) 100 MG tablet  Daily        01/25/23 0819    folic acid  (FOLVITE ) 1 MG tablet  Daily        01/25/23 0819              Kingsley, Rubens Cranston K, DO 01/25/23 [phone]
